# Patient Record
Sex: Male | Born: 1960 | Race: White | Hispanic: No | Smoking: Former smoker
Health system: Southern US, Community
[De-identification: ages and names within clinical notes are randomized; demographics above are authoritative.]

## PROBLEM LIST (undated history)

## (undated) DIAGNOSIS — G959 Disease of spinal cord, unspecified: Secondary | ICD-10-CM

## (undated) DIAGNOSIS — I10 Essential (primary) hypertension: Secondary | ICD-10-CM

## (undated) DIAGNOSIS — G20A1 Parkinson's disease without dyskinesia, without mention of fluctuations: Secondary | ICD-10-CM

## (undated) DIAGNOSIS — I639 Cerebral infarction, unspecified: Secondary | ICD-10-CM

## (undated) HISTORY — DX: Disease of spinal cord, unspecified: G95.9

## (undated) HISTORY — DX: Cerebral infarction, unspecified: I63.9

---

## 2020-03-13 ENCOUNTER — Other Ambulatory Visit: Payer: Self-pay

## 2020-03-13 ENCOUNTER — Ambulatory Visit: Payer: BLUE CROSS/BLUE SHIELD | Admitting: Family Medicine

## 2020-03-13 ENCOUNTER — Encounter: Payer: Self-pay | Admitting: Family Medicine

## 2020-03-13 VITALS — BP 112/80 | HR 85 | Temp 97.8°F | Ht 73.25 in | Wt 281.8 lb

## 2020-03-13 DIAGNOSIS — Z125 Encounter for screening for malignant neoplasm of prostate: Secondary | ICD-10-CM

## 2020-03-13 DIAGNOSIS — Z1211 Encounter for screening for malignant neoplasm of colon: Secondary | ICD-10-CM | POA: Diagnosis not present

## 2020-03-13 DIAGNOSIS — E669 Obesity, unspecified: Secondary | ICD-10-CM

## 2020-03-13 DIAGNOSIS — Z0000 Encounter for general adult medical examination without abnormal findings: Secondary | ICD-10-CM | POA: Diagnosis not present

## 2020-03-13 DIAGNOSIS — I10 Essential (primary) hypertension: Secondary | ICD-10-CM

## 2020-03-13 DIAGNOSIS — K219 Gastro-esophageal reflux disease without esophagitis: Secondary | ICD-10-CM

## 2020-03-13 NOTE — Progress Notes (Signed)
   Subjective:    Patient ID: John [name], male    DOB: 20-Jan-1961, 59 y.o.   MRN: [id number]  HPI He is here for complete examination.  He recently moved back to this area from [place].  He was a patient of mine before he moved to [place].  He does have a history of hypertension and presently is on Maxide.  He did have Covid in March of this year but states that other than fatigue he has had no troubles since then.  No chest pain, shortness of breath, fever, chills, nausea vomiting or diarrhea.  There is a family history of prostate cancer with his father dying at age 20 of this.  He does have reflux symptoms and presently taking Nexium for that.  He plans to start an exercise program of bicycling which he has done in the past but has stopped in the last several years.  He is not married and not dating anyone.  Family and social history as well as health maintenance and immunizations was reviewed.   Review of Systems  All other systems reviewed and are negative.      Objective:   Physical Exam  Alert and in no distress. Tympanic membranes and canals are normal. Pharyngeal area is normal. Neck is supple without adenopathy or thyromegaly. Cardiac exam shows a regular sinus rhythm without murmurs or gallops. Lungs are clear to auscultation. EKG was reviewed by me.  It was seen baseline interference on this as well as nonspecific ST-T changes.  Other parameters appeared normal.       Assessment & Plan:  Routine general medical examination at a health care facility - Plan: CBC with Differential/Platelet, Comprehensive metabolic panel, Hepatitis C antibody, EKG 12-Lead, Lipid panel  Essential hypertension - Plan: CBC with Differential/Platelet, Comprehensive metabolic panel, EKG 12-Lead  Screening for prostate cancer - Plan: PSA  Screening for colon cancer - Plan: Cologuard  Obesity (BMI 30.0-34.9) - Plan: CBC with Differential/Platelet, Comprehensive metabolic panel, Lipid  panel  Gastroesophageal reflux disease without esophagitis He is to return here in November and we will give him Shingrix as well as a Tdap.  Encouraged him to go ahead and start his exercise program but start low and go slow.  Discussed using proper protective equipment while riding his bike. Recommend he switch to Pepcid for control of his reflux symptoms and cut back to minimum effective dosing of that.  He was comfortable with that.

## 2020-03-13 NOTE — Patient Instructions (Signed)
Try Pepcid instead of the Nexium and see if he can get away with taking it every other day or even every third day

## 2020-03-14 LAB — COMPREHENSIVE METABOLIC PANEL
ALT: 10 IU/L (ref 0–44)
AST: 16 IU/L (ref 0–40)
Albumin/Globulin Ratio: 1.6 (ref 1.2–2.2)
Albumin: 4.2 g/dL (ref 3.8–4.9)
Alkaline Phosphatase: 89 IU/L (ref 48–121)
BUN/Creatinine Ratio: 20 (ref 9–20)
BUN: 21 mg/dL (ref 6–24)
Bilirubin Total: 0.4 mg/dL (ref 0.0–1.2)
CO2: 21 mmol/L (ref 20–29)
Calcium: 8.9 mg/dL (ref 8.7–10.2)
Chloride: 103 mmol/L (ref 96–106)
Creatinine, Ser: 1.06 mg/dL (ref 0.76–1.27)
GFR calc Af Amer: 88 mL/min/{1.73_m2} (ref >59)
GFR calc non Af Amer: 76 mL/min/{1.73_m2} (ref >59)
Globulin, Total: 2.7 g/dL (ref 1.5–4.5)
Glucose: 107 mg/dL — ABNORMAL HIGH (ref 65–99)
Potassium: 3.8 mmol/L (ref 3.5–5.2)
Sodium: 138 mmol/L (ref 134–144)
Total Protein: 6.9 g/dL (ref 6.0–8.5)

## 2020-03-14 LAB — CBC WITH DIFFERENTIAL/PLATELET
Basophils Absolute: 0.1 10*3/uL (ref 0.0–0.2)
Basos: 1 %
EOS (ABSOLUTE): 0.6 10*3/uL — ABNORMAL HIGH (ref 0.0–0.4)
Eos: 10 %
Hematocrit: 35.5 % — ABNORMAL LOW (ref 37.5–51.0)
Hemoglobin: 11.8 g/dL — ABNORMAL LOW (ref 13.0–17.7)
Immature Grans (Abs): 0 10*3/uL (ref 0.0–0.1)
Immature Granulocytes: 0 %
Lymphocytes Absolute: 1.6 10*3/uL (ref 0.7–3.1)
Lymphs: 28 %
MCH: 28.1 pg (ref 26.6–33.0)
MCHC: 33.2 g/dL (ref 31.5–35.7)
MCV: 85 fL (ref 79–97)
Monocytes Absolute: 0.6 10*3/uL (ref 0.1–0.9)
Monocytes: 10 %
Neutrophils Absolute: 3 10*3/uL (ref 1.4–7.0)
Neutrophils: 51 %
Platelets: 233 10*3/uL (ref 150–450)
RBC: 4.2 x10E6/uL (ref 4.14–5.80)
RDW: 13.5 % (ref 11.6–15.4)
WBC: 5.9 10*3/uL (ref 3.4–10.8)

## 2020-03-14 LAB — LIPID PANEL
Chol/HDL Ratio: 3.4 ratio (ref 0.0–5.0)
Cholesterol, Total: 171 mg/dL (ref 100–199)
HDL: 51 mg/dL (ref >39)
LDL Chol Calc (NIH): 90 mg/dL (ref 0–99)
Triglycerides: 178 mg/dL — ABNORMAL HIGH (ref 0–149)
VLDL Cholesterol Cal: 30 mg/dL (ref 5–40)

## 2020-03-14 LAB — PSA: Prostate Specific Ag, Serum: 0.7 ng/mL (ref 0.0–4.0)

## 2020-03-14 LAB — HEPATITIS C ANTIBODY: Hep C Virus Ab: <0.1 s/co ratio (ref 0.0–0.9)

## 2020-03-23 LAB — COLOGUARD: Cologuard: NEGATIVE

## 2020-03-27 LAB — COLOGUARD: COLOGUARD: NEGATIVE

## 2020-03-29 NOTE — Progress Notes (Signed)
Pt was advised of negative cologuard. KH 

## 2020-04-11 ENCOUNTER — Encounter: Payer: Self-pay | Admitting: Family Medicine

## 2020-07-31 ENCOUNTER — Other Ambulatory Visit: Payer: Self-pay

## 2020-07-31 ENCOUNTER — Other Ambulatory Visit (INDEPENDENT_AMBULATORY_CARE_PROVIDER_SITE_OTHER): Payer: BC Managed Care – PPO

## 2020-07-31 DIAGNOSIS — Z23 Encounter for immunization: Secondary | ICD-10-CM | POA: Diagnosis not present

## 2020-08-21 DIAGNOSIS — M545 Low back pain, unspecified: Secondary | ICD-10-CM | POA: Insufficient documentation

## 2020-10-09 ENCOUNTER — Telehealth: Payer: Self-pay | Admitting: Internal Medicine

## 2020-10-09 ENCOUNTER — Other Ambulatory Visit: Payer: Self-pay

## 2020-10-09 ENCOUNTER — Other Ambulatory Visit (INDEPENDENT_AMBULATORY_CARE_PROVIDER_SITE_OTHER): Payer: BC Managed Care – PPO

## 2020-10-09 DIAGNOSIS — Z23 Encounter for immunization: Secondary | ICD-10-CM | POA: Diagnosis not present

## 2020-10-09 MED ORDER — ESOMEPRAZOLE MAGNESIUM 20 MG PO CPDR: 20.0000 mg | DELAYED_RELEASE_CAPSULE | Freq: Every day | ORAL | 1 refills | Status: DC

## 2020-10-09 NOTE — Telephone Encounter (Signed)
Patient would like a prescription for nexium #90 to be sent in to [facility]. He states its cheaper to get an rx vs over the counter.

## 2020-10-13 ENCOUNTER — Telehealth: Payer: Self-pay

## 2020-10-13 NOTE — Telephone Encounter (Signed)
Pt was advise to call insurance company to find out what they will cover. KH

## 2021-02-26 ENCOUNTER — Other Ambulatory Visit: Payer: Self-pay

## 2021-02-26 ENCOUNTER — Ambulatory Visit: Payer: BC Managed Care – PPO | Admitting: Family Medicine

## 2021-02-26 ENCOUNTER — Encounter: Payer: Self-pay | Admitting: Family Medicine

## 2021-02-26 VITALS — BP 160/90 | HR 96 | Temp 98.3°F | Ht 74.0 in | Wt 315.8 lb

## 2021-02-26 DIAGNOSIS — M79672 Pain in left foot: Secondary | ICD-10-CM

## 2021-02-26 NOTE — Progress Notes (Signed)
   Subjective:    Patient ID: John [name], male    DOB: 1961/01/24, 60 y.o.   MRN: [id number]  HPI Noted some difficulty with left lateral foot pain that started on Friday.  No history of injury or overuse.  No other joint or foot discomfort.  He notes that especially when he walks barefoot that it causes trouble.   Review of Systems     Objective:   Physical Exam Exam of the left foot shows decreased dorsiflexion of the foot.  No tenderness over the lateral malleolus.  1 cm round saline mobile lesion is noted in the area of the fifth metatarsal.  Minimal tenderness to that area.  No tenderness over the phalanges.       Assessment & Plan:  Left foot pain - Plan: Ambulatory referral to Podiatry Makes me wonder whether this is a ganglion cyst.  Referral will be made.

## 2021-03-01 ENCOUNTER — Telehealth: Payer: Self-pay

## 2021-03-01 NOTE — Telephone Encounter (Signed)
Pt states he slipped last night going up the stairs and foot is now killing him, can hardly walk on it and would like something for pain called into [facility]

## 2021-03-01 NOTE — Telephone Encounter (Signed)
Pt informed, he will let us know tomorrow if not better will call back to get Xray

## 2021-03-05 ENCOUNTER — Ambulatory Visit: Payer: BC Managed Care – PPO | Admitting: Podiatry

## 2021-03-05 ENCOUNTER — Other Ambulatory Visit: Payer: Self-pay

## 2021-03-05 ENCOUNTER — Other Ambulatory Visit: Payer: Self-pay | Admitting: Podiatry

## 2021-03-05 ENCOUNTER — Ambulatory Visit (INDEPENDENT_AMBULATORY_CARE_PROVIDER_SITE_OTHER): Payer: BC Managed Care – PPO

## 2021-03-05 ENCOUNTER — Encounter: Payer: Self-pay | Admitting: Podiatry

## 2021-03-05 DIAGNOSIS — M722 Plantar fascial fibromatosis: Secondary | ICD-10-CM

## 2021-03-05 DIAGNOSIS — M79672 Pain in left foot: Secondary | ICD-10-CM | POA: Diagnosis not present

## 2021-03-05 DIAGNOSIS — M7672 Peroneal tendinitis, left leg: Secondary | ICD-10-CM

## 2021-03-05 MED ORDER — TRIAMCINOLONE ACETONIDE 10 MG/ML IJ SUSP
10.0000 mg | Freq: Once | INTRAMUSCULAR | Status: AC
  Administered 2021-03-05: 17:00:00 10 mg

## 2021-03-05 NOTE — Progress Notes (Signed)
Subjective:   Patient ID: John [name], male   DOB: 60 y.o.   MRN: [id number]   HPI Patient presents stating that he has a lot of pain on the outside of his left foot and its been this way recently and he is obese which is a complicating factor.  Does not remember specific injury but then did seem to traumatized his foot after the initial pain has started.  Patient does not smoke likes to be active   Review of Systems  All other systems reviewed and are negative.      Objective:  Physical Exam Vitals and nursing note reviewed.  Constitutional:      Appearance: He is well-developed.  Pulmonary:     Effort: Pulmonary effort is normal.  Musculoskeletal:        General: Normal range of motion.  Skin:    General: Skin is warm.  Neurological:     Mental Status: He is alert.    Neurovascular status intact muscle strength adequate range of motion adequate with patient being markedly obese which is complicating factor with exquisite discomfort in the lateral side of the left foot with inflammation fluid at the peroneal insertion base of fifth metatarsal.  Patient is found to have good digital perfusion well oriented x3      Assessment:  Acute peroneal tendinitis left with inflammation fluid     Plan:  H&P reviewed condition at this point I did sterile prep and injected the peroneal insertion 3 mg Kenalog 5 mg Xylocaine at insertion and I applied fascial brace to lift up the lateral side of the foot with instructions.  Reappoint 3 weeks or earlier if needed  X-rays were negative for signs of fracture or other bony pathology associated with the pain that he is experiencing

## 2021-03-09 ENCOUNTER — Telehealth: Payer: Self-pay | Admitting: *Deleted

## 2021-03-09 NOTE — Telephone Encounter (Signed)
Patient is calling for an appointment asap, was stepping out of front door today and heard his ankle pop.

## 2021-03-12 ENCOUNTER — Other Ambulatory Visit: Payer: Self-pay

## 2021-03-12 ENCOUNTER — Encounter: Payer: Self-pay | Admitting: Podiatry

## 2021-03-12 ENCOUNTER — Ambulatory Visit: Payer: BC Managed Care – PPO | Admitting: Podiatry

## 2021-03-12 DIAGNOSIS — M7672 Peroneal tendinitis, left leg: Secondary | ICD-10-CM

## 2021-03-12 DIAGNOSIS — R600 Localized edema: Secondary | ICD-10-CM | POA: Diagnosis not present

## 2021-03-12 NOTE — Progress Notes (Signed)
Subjective:   Patient ID: John [name], male   DOB: 60 y.o.   MRN: [id number]   HPI Patient states he has had a reduction of pain but he did land wrong on his foot and he has developed swelling in the foot and lower leg.  States that he has not had any shortness of breath or any other symptoms   ROS      Objective:  Physical Exam  Patient has developed quite a bit of pitting edema in the left midfoot into the ankle and left lower leg negative Denna Haggard' sign but concerned about the swelling mechanism he is developed     Assessment:  Cannot rule out that this may not be a blood clot versus trauma to his left lateral side     Plan:  H&P discussed and I did a plan for Unna boot Ace wrap to try to reduce some of the swelling but I am sending him for a stat Doppler and have asked and sent a request for this.  I gave strict instructions if he develops any furthering of symptoms any pain or any long symptoms he is to go straight to the emergency room.  Patient will be seen back in the next 2 weeks but earlier if any further pathology were to occur prior to that visit

## 2021-03-20 ENCOUNTER — Other Ambulatory Visit: Payer: Self-pay

## 2021-03-20 ENCOUNTER — Telehealth: Payer: Self-pay | Admitting: *Deleted

## 2021-03-20 ENCOUNTER — Ambulatory Visit (HOSPITAL_COMMUNITY)
Admission: RE | Admit: 2021-03-20 | Discharge: 2021-03-20 | Disposition: A | Discharge status: Home / Self Care | Payer: BC Managed Care – PPO | Source: Ambulatory Visit | Attending: Podiatry | Admitting: Podiatry

## 2021-03-20 DIAGNOSIS — R600 Localized edema: Secondary | ICD-10-CM | POA: Insufficient documentation

## 2021-03-20 NOTE — Telephone Encounter (Signed)
[facility] calling for results of the [place] study. The patient is negative for DVT of his left leg. Report will be faxed to office.

## 2021-03-26 ENCOUNTER — Ambulatory Visit: Payer: BC Managed Care – PPO | Admitting: Podiatry

## 2021-03-29 ENCOUNTER — Ambulatory Visit: Payer: BC Managed Care – PPO | Admitting: Podiatry

## 2021-05-19 ENCOUNTER — Encounter (HOSPITAL_BASED_OUTPATIENT_CLINIC_OR_DEPARTMENT_OTHER): Payer: Self-pay | Admitting: Emergency Medicine

## 2021-05-19 ENCOUNTER — Emergency Department (HOSPITAL_BASED_OUTPATIENT_CLINIC_OR_DEPARTMENT_OTHER): Payer: BC Managed Care – PPO

## 2021-05-19 ENCOUNTER — Emergency Department (HOSPITAL_BASED_OUTPATIENT_CLINIC_OR_DEPARTMENT_OTHER)
Admission: EM | Admit: 2021-05-19 | Discharge: 2021-05-19 | Disposition: A | Discharge status: Home / Self Care | Payer: BC Managed Care – PPO | Attending: Emergency Medicine | Admitting: Emergency Medicine

## 2021-05-19 ENCOUNTER — Other Ambulatory Visit: Payer: Self-pay

## 2021-05-19 DIAGNOSIS — S0992XA Unspecified injury of nose, initial encounter: Secondary | ICD-10-CM | POA: Diagnosis present

## 2021-05-19 DIAGNOSIS — F17290 Nicotine dependence, other tobacco product, uncomplicated: Secondary | ICD-10-CM | POA: Diagnosis not present

## 2021-05-19 DIAGNOSIS — I10 Essential (primary) hypertension: Secondary | ICD-10-CM | POA: Insufficient documentation

## 2021-05-19 DIAGNOSIS — W01198A Fall on same level from slipping, tripping and stumbling with subsequent striking against other object, initial encounter: Secondary | ICD-10-CM | POA: Insufficient documentation

## 2021-05-19 DIAGNOSIS — S0121XA Laceration without foreign body of nose, initial encounter: Secondary | ICD-10-CM

## 2021-05-19 DIAGNOSIS — S022XXB Fracture of nasal bones, initial encounter for open fracture: Secondary | ICD-10-CM | POA: Diagnosis not present

## 2021-05-19 HISTORY — DX: Essential (primary) hypertension: I10

## 2021-05-19 MED ORDER — AMOXICILLIN-POT CLAVULANATE 875-125 MG PO TABS: 1.0000 | ORAL_TABLET | Freq: Two times a day (BID) | ORAL | 0 refills | Status: DC

## 2021-05-19 MED ORDER — LIDOCAINE-EPINEPHRINE (PF) 2 %-1:200000 IJ SOLN
INTRAMUSCULAR | Status: AC
  Administered 2021-05-19: 10 mL via INTRADERMAL
  Filled 2021-05-19: qty 20

## 2021-05-19 MED ORDER — LIDOCAINE HCL URETHRAL/MUCOSAL 2 % EX GEL
1.0000 "application " | Freq: Once | CUTANEOUS | Status: DC
  Filled 2021-05-19: qty 11

## 2021-05-19 MED ORDER — LIDOCAINE HCL 2 % IJ SOLN: 10.0000 mL | Freq: Once | INTRAMUSCULAR | Status: DC

## 2021-05-19 MED ORDER — ACETAMINOPHEN 500 MG PO TABS
1000.0000 mg | ORAL_TABLET | Freq: Once | ORAL | Status: DC
  Filled 2021-05-19: qty 2

## 2021-05-19 MED ORDER — BACITRACIN ZINC 500 UNIT/GM EX OINT: 1.0000 "application " | TOPICAL_OINTMENT | Freq: Two times a day (BID) | CUTANEOUS | 0 refills | Status: DC

## 2021-05-19 MED ORDER — LIDOCAINE-EPINEPHRINE (PF) 2 %-1:200000 IJ SOLN: 10.0000 mL | Freq: Once | INTRAMUSCULAR | Status: AC

## 2021-05-19 MED ORDER — OXYCODONE-ACETAMINOPHEN 5-325 MG PO TABS
1.0000 | ORAL_TABLET | ORAL | Status: DC | PRN
  Administered 2021-05-19: 1 via ORAL
  Filled 2021-05-19: qty 1

## 2021-05-19 MED ORDER — AMOXICILLIN-POT CLAVULANATE 875-125 MG PO TABS
1.0000 | ORAL_TABLET | Freq: Once | ORAL | Status: AC
  Administered 2021-05-19: 1 via ORAL
  Filled 2021-05-19: qty 1

## 2021-05-19 MED ORDER — LIDOCAINE HCL (PF) 1 % IJ SOLN
5.0000 mL | Freq: Once | INTRAMUSCULAR | Status: DC
  Filled 2021-05-19: qty 5

## 2021-05-19 NOTE — ED Notes (Signed)
Pt verbalizes understanding of discharge instructions. Opportunity for questioning and answers were provided. Armand removed by staff, pt discharged from ED to home. Educated to pick up Rx. ABx ointment applied.

## 2021-05-19 NOTE — Discharge Instructions (Addendum)
1.  You have been given an antibiotic called Augmentin.  Take this twice a day as prescribed.  You may take extra strength Tylenol for pain every 6 hours.  Try to keep your head elevated about 30 degrees.  Sleep with some pillows behind your back or in a recliner for the next 2 to 4 days.  This will help with swelling and drainage.  Do not put anything in your nose.  If your nose is dripping or bleeding, dab gently. 2.  After 24 hours you may start bathing and allowing the wound to be cleaned with mild soap and water.  Rinse the wound well, dry well and apply your antibiotic ointment. 3.  Return to the emergency department if you have problems with worsening pain, signs of infection such as increasing redness or yellow drainage.  You should see your doctor for removal of stitches in approximately 7 days.

## 2021-05-19 NOTE — ED Provider Notes (Signed)
..  Laceration Repair  Date/Time: 05/19/2021 8:06 PM Performed by: Cristina Gong, PA-C Authorized by: Cristina Gong, PA-C   Consent:    Consent obtained:  Verbal   Consent given by:  Patient   Risks, benefits, and alternatives were discussed: yes     Risks discussed:  Infection, need for additional repair, poor cosmetic result, pain, retained foreign body, tendon damage, vascular damage, poor wound healing and nerve damage (Need for revision.  Adverse or allergic reaction to any of the medicines or products used)   Alternatives discussed:  No treatment and referral (Alternative wound closures) Universal protocol:    Procedure explained and questions answered to patient or proxy's satisfaction: yes     Immediately prior to procedure, a time out was called: yes     Patient identity confirmed:  Verbally with patient Anesthesia:    Anesthesia method:  Local infiltration   Local anesthetic:  Lidocaine 1% w/o epi (Insulin syringes were used to infiltrate lidocaine 1% without epi into the areas around the nose laceration, and into the mid to left sided upper lip.) Pre-procedure details:    Preparation:  Patient was prepped and draped in usual sterile fashion Exploration:    Hemostasis achieved with:  Epinephrine and direct pressure Treatment:    Area cleansed with:  Povidone-iodine   Irrigation solution:  Sterile saline Skin repair:    Repair method:  Sutures   Suture size:  6-0 and 5-0   Suture material:  Prolene   Suture technique:  Simple interrupted   Number of sutures:  9 (Six 6-0 Prolene sutures along the tip of the nose and to the exterior nasal septum, three 5-0 Prolene sutures on the upper lip/philtrum) Approximation:    Approximation:  Close Repair type:    Repair type:  Intermediate Post-procedure details:    Procedure completion:  Tolerated with difficulty (Difficulty due to chronic back pain, not due to the procedure itself other than positioning.) Comments:      Topical lidocaine with epinephrine was used soaked on a gauze pad to control bleeding with pressure as attempts at controlling bleeding to allow for safe suturing and inspection without epi containing product was unsuccessful.  The lidocaine and epi soaked gauze was used primarily on the lip/inferior border of the nose, not along the tip of the nose.       Cristina Gong, Cordelia Poche 05/19/21 2017    Arby Barrette, MD 05/20/21 [phone]

## 2021-05-19 NOTE — ED Triage Notes (Signed)
Pt fell face first onto concrete. Has laceration to nose and dried blood to nose.

## 2021-05-19 NOTE — ED Provider Notes (Signed)
[facility] EMERGENCY DEPT Provider Note   CSN: [id number] Arrival date & time: 05/19/21  1243     History Chief Complaint  Patient presents with   Marletta Lor    John [name] is a 60 y.o. male.  HPI Patient reports that he was stooping to pick something up off of the ground and lost his balance.  This caused him to fall forward and then hit his nose directly on the concrete step.  Patient denies he had any loss of consciousness.  He reports he has a lot of pain in his nose.  He reports it bled a lot.  He denies pain in his neck.  He denies injury to his arms or legs.  He reports other than the injury to his face, he does not have other pain.    Past Medical History:  Diagnosis Date   Hypertension     There are no problems to display for this patient.   History reviewed. No pertinent surgical history.     No family history on file.  Social History   Tobacco Use   Smoking status: Light Smoker    Types: Cigars   Smokeless tobacco: Never  Vaping Use   Vaping Use: Never used  Substance Use Topics   Alcohol use: Yes    Alcohol/week: 1.0 - 2.0 standard drink    Types: 1 - 2 Shots of liquor per week   Drug use: Never    Home Medications Prior to Admission medications   Medication Sig Start Date End Date Taking? Authorizing Provider  amoxicillin-clavulanate (AUGMENTIN) 875-125 MG tablet Take 1 tablet by mouth 2 (two) times daily. One po bid x 7 days 05/19/21  Yes Adon Gehlhausen, Lebron Conners, MD  bacitracin ointment Apply 1 application topically 2 (two) times daily. 05/19/21  Yes Arby Barrette, MD  acetaminophen (TYLENOL) 500 MG tablet Take 500 mg by mouth every 6 (six) hours as needed.    [provider]  cyclobenzaprine (FLEXERIL) 10 MG tablet cyclobenzaprine 10 mg tablet  Take 1 tablet twice a day by oral route for 30 days.    [provider]  esomeprazole (NEXIUM) 20 MG capsule Take 1 capsule (20 mg total) by mouth daily at 12 noon. 10/09/20   Ronnald Nian, MD  triamterene-hydrochlorothiazide (MAXZIDE-25) 37.5-25 MG tablet Take 1 tablet by mouth daily.    [provider]    Allergies    Patient has no allergy information on record.  Review of Systems   Review of Systems 10 systems reviewed negative except for HPI Physical Exam Updated Vital Signs BP (!) 155/86 (BP Location: Right Arm)   Pulse 95   Temp 98 F (36.7 C) (Oral)   Resp 18   SpO2 99%   Physical Exam Constitutional:      Comments: Patient is alert.  No distress.  Mental status clear.  Obvious nasal laceration.  HENT:     Nose:     Comments: Patient superficial abrasion to the bridge of the nose.  Moderate general swelling of the nose.  A linear laceration on the tip of the nose straight down the middle of the septum to the upper lip.  Mild gaping.  Depth of several millimeters.  No active bleeding.  Patient does have a large clot just proximally in the left nare.  Behind his clot however passages clear.  Right nare clear.    Mouth/Throat:     Pharynx: Oropharynx is clear.  Eyes:     Extraocular Movements:  Extraocular movements intact.     Pupils: Pupils are equal, round, and reactive to light.  Neck:     Comments: C-spine nontender. Cardiovascular:     Rate and Rhythm: Normal rate and regular rhythm.  Pulmonary:     Effort: Pulmonary effort is normal.     Breath sounds: Normal breath sounds.  Chest:     Chest wall: No tenderness.  Abdominal:     General: There is no distension.     Palpations: Abdomen is soft.     Tenderness: There is no abdominal tenderness.  Musculoskeletal:        General: Normal range of motion.  Skin:    General: Skin is warm and dry.  Neurological:     General: No focal deficit present.     Mental Status: He is oriented to person, place, and time.     Coordination: Coordination normal.  Psychiatric:        Mood and Affect: Mood normal.    ED Results / Procedures / Treatments   Labs (all labs ordered are listed,  but only abnormal results are displayed) Labs Reviewed - No data to display  EKG None  Radiology CT HEAD WO CONTRAST ( )  Result Date: 05/19/2021 CLINICAL DATA:  Facial trauma.  Fall face first onto concrete. EXAM: CT HEAD WITHOUT CONTRAST TECHNIQUE: Contiguous axial images were obtained from the base of the skull through the vertex without intravenous contrast. COMPARISON:  None. FINDINGS: Brain: Generalized cerebral atrophy. No intracranial hemorrhage, mass effect, or midline shift. No hydrocephalus. The basilar cisterns are patent. Minimal periventricular chronic small vessel ischemia. 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 No evidence of territorial infarct or acute ischemia. No extra-axial or intracranial fluid collection. Vascular: No hyperdense vessel. Skull: No fracture or focal lesion. Sinuses/Orbits: Assessed on concurrent face CT, reported separately. Other: None. IMPRESSION: 1. No acute intracranial abnormality. No skull fracture. 2. Generalized cerebral atrophy. 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 Electronically Signed   By: Narda Rutherford M.D.   On: 05/19/2021 17:02   CT Maxillofacial Wo Contrast  Result Date: 05/19/2021 CLINICAL DATA:  Fall face first onto concrete.  Laceration to nose. EXAM: CT MAXILLOFACIAL WITHOUT CONTRAST TECHNIQUE: Multidetector CT imaging of the maxillofacial structures was performed. Multiplanar CT image reconstructions were also generated. COMPARISON:  None. FINDINGS: Osseous: Minimal buckling of the anterior nasal septum of unknown acuity. There is slight leftward nasal septal deviation. Cortical irregularity of the anterior nasal spine. Intact mandibles and zygomatic arches. There scattered dental caries. The temporomandibular joints are congruent. Orbits: No acute orbital fracture.  There is no globe injury. Sinuses: No sinus fracture or fluid level. Soft tissues: Inferior nasal soft  tissue edema with small foci of gas. Limited intracranial: Assessed on concurrent head CT, reported separately. IMPRESSION: 1. Cortical irregularity of the anterior nasal spine. Minimal buckling of the anterior nasal septum. Given overlying soft tissue thickening, favored to be acute nondisplaced fractures. 2. No additional acute facial bone fracture. Electronically Signed   By: Narda Rutherford M.D.   On: 05/19/2021 17:00    Procedures Procedures   Medications Ordered in ED Medications  oxyCODONE-acetaminophen (PERCOCET/ROXICET) 5-325 MG per tablet 1 tablet (1 tablet Oral Given 05/19/21 1258)  lidocaine (XYLOCAINE) 2 % jelly 1 application (has no administration in time range)  lidocaine (PF) (XYLOCAINE) 1 % injection 5 mL (has no administration in time range)  lidocaine-EPINEPHrine (XYLOCAINE W/EPI) 2 %-1:200000 (PF) injection (  has no administration in time range)  lidocaine-EPINEPHrine (XYLOCAINE W/EPI) 2 %-1:200000 (PF) injection 10 mL (has no administration in time range)  amoxicillin-clavulanate (AUGMENTIN) 875-125 MG per tablet 1 tablet (has no administration in time range)  acetaminophen (TYLENOL) tablet 1,000 mg (has no administration in time range)    ED Course  I have reviewed the triage vital signs and the nursing notes.  Pertinent labs & imaging results that were available during my care of the patient were reviewed by me and considered in my medical decision making (see chart for details).    MDM Rules/Calculators/A&P                           Patient presents with fall.  CT shows possible nasal fracture.  Patient has complex nasal laceration. No other apparent injury.  Laceration repaired by [place].Due to nature of the wound and underlying fracture, patient will be placed on Augmentin.  Patient is advised for maintaining semirecumbent position and application of bacitracin ointment.  Follow-up plan with PCP reviewed.  Patient is advised he may need referral to ENT but can  first follow-up with PCP for suture removal. Final Clinical Impression(s) / ED Diagnoses Final diagnoses:  Complex laceration of nose, initial encounter  Open fracture of nasal bone, initial encounter    Rx / DC Orders ED Discharge Orders          Ordered    amoxicillin-clavulanate (AUGMENTIN) 875-125 MG tablet  2 times daily        05/19/21 2011    bacitracin ointment  2 times daily        05/19/21 2011             Arby Barrette, MD 05/19/21 2018

## 2021-05-19 NOTE — ED Notes (Signed)
As I write this, he is in CT. 

## 2021-05-21 ENCOUNTER — Telehealth: Payer: Self-pay | Admitting: Family Medicine

## 2021-05-21 NOTE — Telephone Encounter (Signed)
Patient called on call provider and states he feel down on his back porch and stated he was bleeding and needed stitches. Advised patient to go to an urgent care or the emergency department for evaluation and treatment. He agreed and stated he was able to drive or get someone to drive him if not.

## 2021-05-31 ENCOUNTER — Other Ambulatory Visit: Payer: Self-pay

## 2021-05-31 ENCOUNTER — Ambulatory Visit: Payer: BC Managed Care – PPO | Admitting: Family Medicine

## 2021-05-31 VITALS — BP 118/76 | HR 89 | Temp 96.3°F | Wt 290.4 lb

## 2021-05-31 DIAGNOSIS — R296 Repeated falls: Secondary | ICD-10-CM

## 2021-05-31 DIAGNOSIS — R4781 Slurred speech: Secondary | ICD-10-CM

## 2021-05-31 DIAGNOSIS — S0121XA Laceration without foreign body of nose, initial encounter: Secondary | ICD-10-CM

## 2021-05-31 NOTE — Progress Notes (Signed)
   Subjective:    Patient ID: John [name], male    DOB: 03/18/1961, 60 y.o.   MRN: [id number]  HPI He fell on September 3 sustaining a laceration to the nose.  He was cared for in the emergency room.  Sutures were applied to the nose and upper lip.  CT scans were done.  It did show possible nasal soft tissue damage.  The CT scan also showed cerebral atrophy and an old lacunar infarct.  He also states that he has been falling a lot more recently than in the past.   Review of Systems     Objective:   Physical Exam Alert and in no distress.  Nasal laceration is noted to the mid nose and mid upper lip.  His speech is slurred.  He was observed walking with a normal gait.       Assessment & Plan:  Laceration of nose, initial encounter  Slurred speech  Multiple falls The sutures were all removed without difficulty.  He does show residual scarring to the bridge of the nose.  He is not interested in any further revision of that. Then discussed the slurred speech and the multiple falls.  I will have him return here for complete examination and then referral to neurology for their input into his cerebral atrophy and lacunar infarct.

## 2021-07-16 ENCOUNTER — Telehealth: Payer: Self-pay | Admitting: Family Medicine

## 2021-07-16 ENCOUNTER — Other Ambulatory Visit: Payer: Self-pay

## 2021-07-16 MED ORDER — OMEPRAZOLE MAGNESIUM 20 MG PO TBEC: 20.0000 mg | DELAYED_RELEASE_TABLET | Freq: Every day | ORAL | 1 refills | Status: DC

## 2021-07-16 NOTE — Telephone Encounter (Signed)
Pt called and wants referral to [facility] for stroke symptoms and memory issues. He states he has seen you for this previously.  He is also requesting rx for prilosec  ( otc) sent to [facility]

## 2021-07-24 ENCOUNTER — Telehealth: Payer: Self-pay

## 2021-07-24 NOTE — Telephone Encounter (Signed)
Appt made. KH 

## 2021-07-24 NOTE — Telephone Encounter (Signed)
Pt wants to know about [facility] for stroke symptoms and memory concerns. Please advise if this can be put in or should pt be seen first. [facility]

## 2021-07-27 ENCOUNTER — Other Ambulatory Visit: Payer: Self-pay

## 2021-07-27 ENCOUNTER — Ambulatory Visit: Payer: BC Managed Care – PPO | Admitting: Family Medicine

## 2021-07-27 ENCOUNTER — Encounter: Payer: Self-pay | Admitting: Family Medicine

## 2021-07-27 VITALS — BP 144/96 | HR 84 | Ht 74.0 in | Wt 272.8 lb

## 2021-07-27 DIAGNOSIS — R4781 Slurred speech: Secondary | ICD-10-CM

## 2021-07-27 DIAGNOSIS — Z23 Encounter for immunization: Secondary | ICD-10-CM | POA: Diagnosis not present

## 2021-07-27 DIAGNOSIS — I10 Essential (primary) hypertension: Secondary | ICD-10-CM

## 2021-07-27 DIAGNOSIS — R296 Repeated falls: Secondary | ICD-10-CM | POA: Diagnosis not present

## 2021-07-27 MED ORDER — LOSARTAN POTASSIUM-HCTZ 50-12.5 MG PO TABS
1.0000 | ORAL_TABLET | Freq: Every day | ORAL | 3 refills | Status: DC
Start: 2021-07-27 — End: 2021-10-29

## 2021-07-27 NOTE — Progress Notes (Signed)
   Subjective:    Patient ID: John [name], male    DOB: March 11, 1961, 60 y.o.   MRN: [id number]  HPI He is here for follow-up visit.  On his last visit he was recommended to come back for further evaluation which is why he is here.  He has a history of multiple falls over the last several months.  He is also noted speech difficulty with slight slurring of his words as well as feeling as if his muscles are not working like they should.  He is also walking at a slower pace.  He has had no skin or hair changes, hot or cold intolerance.  Does not smoke or do street drugs He also states that he would like his blood pressure medicine changed as he does have nocturia related to this.   Review of Systems     Objective:   Physical Exam Alert and in no distress.  MMSE is 29.  EOMI.  Other cranial nerves intact.  He does have somewhat of a masked facies look.  Cerebellar testing negative.  Negative pronator drift.  No clonus.  Reflexes difficult to get in the upper extremities but 2-3+ in the lower extremities.  No clonus.  He was observed walking and does have a questionable parkinsonian gait.       Assessment & Plan:  Slurred speech - Plan: CBC with Differential/Platelet, Comprehensive metabolic panel, Lipid panel, TSH, Vitamin B12, RPR  Multiple falls - Plan: CBC with Differential/Platelet, Comprehensive metabolic panel, Lipid panel, TSH, Vitamin B12, RPR  Immunization, viral disease - Plan: [employer], [employer]  Essential hypertension - Plan: losartan-hydrochlorothiazide (HYZAAR) 50-12.5 MG tablet I explained that his symptoms are suggestive of Parkinson's disease and I will do routine blood screening to evaluate other possible treatable causes.  If negative, I will refer to neurology for further evaluation and treatment.  Follow-up on blood pressure in several months.

## 2021-07-28 LAB — LIPID PANEL
Chol/HDL Ratio: 3.9 ratio (ref 0.0–5.0)
Cholesterol, Total: 178 mg/dL (ref 100–199)
HDL: 46 mg/dL (ref >39)
LDL Chol Calc (NIH): 108 mg/dL — ABNORMAL HIGH (ref 0–99)
Triglycerides: 137 mg/dL (ref 0–149)
VLDL Cholesterol Cal: 24 mg/dL (ref 5–40)

## 2021-07-28 LAB — COMPREHENSIVE METABOLIC PANEL
ALT: 15 IU/L (ref 0–44)
AST: 20 IU/L (ref 0–40)
Albumin/Globulin Ratio: 2 (ref 1.2–2.2)
Albumin: 4.3 g/dL (ref 3.8–4.9)
Alkaline Phosphatase: 94 IU/L (ref 44–121)
BUN/Creatinine Ratio: 17 (ref 10–24)
BUN: 23 mg/dL (ref 8–27)
Bilirubin Total: 0.3 mg/dL (ref 0.0–1.2)
CO2: 24 mmol/L (ref 20–29)
Calcium: 9.4 mg/dL (ref 8.6–10.2)
Chloride: 103 mmol/L (ref 96–106)
Creatinine, Ser: 1.38 mg/dL — ABNORMAL HIGH (ref 0.76–1.27)
Globulin, Total: 2.1 g/dL (ref 1.5–4.5)
Glucose: 102 mg/dL — ABNORMAL HIGH (ref 70–99)
Potassium: 3.2 mmol/L — ABNORMAL LOW (ref 3.5–5.2)
Sodium: 143 mmol/L (ref 134–144)
Total Protein: 6.4 g/dL (ref 6.0–8.5)
eGFR: 59 mL/min/{1.73_m2} — ABNORMAL LOW (ref >59)

## 2021-07-28 LAB — CBC WITH DIFFERENTIAL/PLATELET
Basophils Absolute: 0.1 10*3/uL (ref 0.0–0.2)
Basos: 1 %
EOS (ABSOLUTE): 0.4 10*3/uL (ref 0.0–0.4)
Eos: 6 %
Hematocrit: 32.5 % — ABNORMAL LOW (ref 37.5–51.0)
Hemoglobin: 10.5 g/dL — ABNORMAL LOW (ref 13.0–17.7)
Immature Grans (Abs): 0 10*3/uL (ref 0.0–0.1)
Immature Granulocytes: 0 %
Lymphocytes Absolute: 1.7 10*3/uL (ref 0.7–3.1)
Lymphs: 28 %
MCH: 25.9 pg — ABNORMAL LOW (ref 26.6–33.0)
MCHC: 32.3 g/dL (ref 31.5–35.7)
MCV: 80 fL (ref 79–97)
Monocytes Absolute: 0.4 10*3/uL (ref 0.1–0.9)
Monocytes: 7 %
Neutrophils Absolute: 3.5 10*3/uL (ref 1.4–7.0)
Neutrophils: 58 %
Platelets: 306 10*3/uL (ref 150–450)
RBC: 4.06 x10E6/uL — ABNORMAL LOW (ref 4.14–5.80)
RDW: 14.9 % (ref 11.6–15.4)
WBC: 6.1 10*3/uL (ref 3.4–10.8)

## 2021-07-28 LAB — VITAMIN B12: Vitamin B-12: 485 pg/mL (ref 232–1245)

## 2021-07-28 LAB — TSH: TSH: 3.38 u[IU]/mL (ref 0.450–4.500)

## 2021-07-28 LAB — RPR: RPR Ser Ql: NONREACTIVE

## 2021-07-29 NOTE — Addendum Note (Signed)
Addended by: Ronnald Nian on: 07/29/2021 09:10 PM   Modules accepted: Orders

## 2021-08-03 NOTE — Progress Notes (Signed)
Mailed letter due to vm not being set up. KH

## 2021-08-08 ENCOUNTER — Telehealth: Payer: Self-pay

## 2021-08-08 NOTE — Telephone Encounter (Signed)
Patient left VM about lab results and neuro referral.  Called back and notified his voicemail is not working he states he just got a new phone. Notified he is ready to schedule with Dr. Everlena Cooper neurologist, he is going to call their office and schedule this

## 2021-08-13 ENCOUNTER — Encounter: Payer: Self-pay | Admitting: Neurology

## 2021-09-27 ENCOUNTER — Telehealth: Payer: Self-pay | Admitting: Family Medicine

## 2021-09-27 NOTE — Telephone Encounter (Signed)
Pt called and is wanting to know what he can take OTC states he has a runny nose that has green mucous he wants to know what he can take to dry it up, I offered him a appt but he did not want one,  pt can be reached at [phone]

## 2021-10-02 ENCOUNTER — Other Ambulatory Visit: Payer: Self-pay | Admitting: Physician Assistant

## 2021-10-02 ENCOUNTER — Other Ambulatory Visit: Payer: Self-pay

## 2021-10-02 ENCOUNTER — Ambulatory Visit
Admission: RE | Admit: 2021-10-02 | Discharge: 2021-10-02 | Disposition: A | Discharge status: Home / Self Care | Payer: BC Managed Care – PPO | Source: Ambulatory Visit | Attending: Physician Assistant | Admitting: Physician Assistant

## 2021-10-02 DIAGNOSIS — R079 Chest pain, unspecified: Secondary | ICD-10-CM

## 2021-10-11 DIAGNOSIS — R0789 Other chest pain: Secondary | ICD-10-CM | POA: Insufficient documentation

## 2021-10-29 ENCOUNTER — Ambulatory Visit (INDEPENDENT_AMBULATORY_CARE_PROVIDER_SITE_OTHER): Payer: BC Managed Care – PPO | Admitting: Family Medicine

## 2021-10-29 ENCOUNTER — Other Ambulatory Visit: Payer: Self-pay

## 2021-10-29 ENCOUNTER — Encounter: Payer: Self-pay | Admitting: Family Medicine

## 2021-10-29 VITALS — BP 126/80 | HR 75 | Temp 97.5°F | Ht 72.5 in | Wt 270.8 lb

## 2021-10-29 DIAGNOSIS — D649 Anemia, unspecified: Secondary | ICD-10-CM | POA: Diagnosis not present

## 2021-10-29 DIAGNOSIS — R0781 Pleurodynia: Secondary | ICD-10-CM

## 2021-10-29 DIAGNOSIS — Z8042 Family history of malignant neoplasm of prostate: Secondary | ICD-10-CM

## 2021-10-29 DIAGNOSIS — K219 Gastro-esophageal reflux disease without esophagitis: Secondary | ICD-10-CM | POA: Insufficient documentation

## 2021-10-29 DIAGNOSIS — I10 Essential (primary) hypertension: Secondary | ICD-10-CM

## 2021-10-29 DIAGNOSIS — R4781 Slurred speech: Secondary | ICD-10-CM

## 2021-10-29 DIAGNOSIS — Z0000 Encounter for general adult medical examination without abnormal findings: Secondary | ICD-10-CM | POA: Insufficient documentation

## 2021-10-29 DIAGNOSIS — E669 Obesity, unspecified: Secondary | ICD-10-CM

## 2021-10-29 DIAGNOSIS — Z125 Encounter for screening for malignant neoplasm of prostate: Secondary | ICD-10-CM

## 2021-10-29 HISTORY — DX: Essential (primary) hypertension: I10

## 2021-10-29 HISTORY — DX: Gastro-esophageal reflux disease without esophagitis: K21.9

## 2021-10-29 HISTORY — DX: Family history of malignant neoplasm of prostate: Z80.42

## 2021-10-29 MED ORDER — LOSARTAN POTASSIUM-HCTZ 50-12.5 MG PO TABS: 1.0000 | ORAL_TABLET | Freq: Every day | ORAL | 3 refills | Status: DC

## 2021-10-29 NOTE — Patient Instructions (Addendum)
you can take 2 extra strength Tylenol 3 times per day and 4 ibuprofen 3 times per day Take the Prilosec every other day to see if that will help with your symptoms.

## 2021-10-29 NOTE — Progress Notes (Signed)
° °  Subjective:    Patient ID: John [name], male    DOB: 1960/12/10, 61 y.o.   MRN: [id number]  HPI He is here for complete examination.  He did fall several weeks ago and is still having some left rib pain.  He did have x-rays which were negative.  He has been taking Tylenol and ibuprofen but still having trouble.  He is scheduled to see neurology for follow-up on his slurred speech.  He recently had an episode of COVID but really did not have much in the way of symptoms.  Continues on his blood pressure medication without difficulty.  He also has reflux disease and is using Prilosec on a regular basis. His father apparently did die from prostate cancer.  Otherwise family and social history as well as health maintenance and immunizations was reviewed.  Review of Systems  All other systems reviewed and are negative.     Objective:   Physical Exam Alert and in no distress.  EOMI.  Other cranial nerves grossly intact.  Slightly flat affect is noted.  He does seem to be walking with a normal gait.  Tympanic membranes and canals are normal. Pharyngeal area is normal. Neck is supple without adenopathy or thyromegaly. Cardiac exam shows a regular sinus rhythm without murmurs or gallops. Lungs are clear to auscultation.  Some left-sided lower left rib pain is noted.  Abdominal exam shows no masses or tenderness.       Assessment & Plan:  Routine general medical examination at a health care facility  Essential hypertension  Slurred speech  Rib pain on left side  Anemia, unspecified type  Screening for prostate cancer  Obesity (BMI 30.0-34.9)  Gastroesophageal reflux disease without esophagitis you can take 2 extra strength Tylenol 3 times per day and 4 ibuprofen 3 times per day

## 2021-10-30 LAB — CBC WITH DIFFERENTIAL/PLATELET
Basophils Absolute: 0.1 10*3/uL (ref 0.0–0.2)
Basos: 1 %
EOS (ABSOLUTE): 0.3 10*3/uL (ref 0.0–0.4)
Eos: 5 %
Hematocrit: 32.7 % — ABNORMAL LOW (ref 37.5–51.0)
Hemoglobin: 10.5 g/dL — ABNORMAL LOW (ref 13.0–17.7)
Immature Grans (Abs): 0 10*3/uL (ref 0.0–0.1)
Immature Granulocytes: 0 %
Lymphocytes Absolute: 1.7 10*3/uL (ref 0.7–3.1)
Lymphs: 30 %
MCH: 26.4 pg — ABNORMAL LOW (ref 26.6–33.0)
MCHC: 32.1 g/dL (ref 31.5–35.7)
MCV: 82 fL (ref 79–97)
Monocytes Absolute: 0.4 10*3/uL (ref 0.1–0.9)
Monocytes: 8 %
Neutrophils Absolute: 3.3 10*3/uL (ref 1.4–7.0)
Neutrophils: 56 %
Platelets: 261 10*3/uL (ref 150–450)
RBC: 3.98 x10E6/uL — ABNORMAL LOW (ref 4.14–5.80)
RDW: 13.3 % (ref 11.6–15.4)
WBC: 5.7 10*3/uL (ref 3.4–10.8)

## 2021-10-30 LAB — PSA: Prostate Specific Ag, Serum: 1 ng/mL (ref 0.0–4.0)

## 2021-11-06 NOTE — Progress Notes (Signed)
He h  NEUROLOGY CONSULTATION NOTE  John [name] MRN: [id number] DOB: 04/10/1961  Referring provider: Sharlot Gowda, MD Primary care provider: Sharlot Gowda, MD  Reason for consult:  slurred speech, falls  Assessment/Plan:   Parkinson's disease  Start carbidopa-levodopa 25/100mg  titrating to 1 tablet three times daily Follow up 3 months.   Subjective:  John [name] is a 61 year old right-handed male with HTN who presents for slurred speech and multiple falls.  History supplemented by PCP's notes.  Since last year, he started having balance problems with increased falls.  He was falling on a weekly basis.  Sometimes he loses his balance when his feet start moving quickly and the rest of his body cannot catch up.  He had a fall in September in which he bent over to pick something off of the ground and lost balance striking his face on the concrete step and sustaining a laceration to the nose.  He was seen in the ED where CT head and face personally reviewed revealed generalized cerebral atrophy and remote lacunar infarct in the anterior limb of the right internal capsule but no fractures or acute intracranial abnormality.  He feels like he is moving slowly.  He has more difficulty writing.  His handwriting is smaller.  He denies difficulty using utensils or chewing and swallowing.  He feels that he cannot speak loudly.  Denies tremor.  Denies REM sleep behavior disorder.  Sense of smell is good.  Has some chronic arthritic low back pain but no radicular pain or numbness in the legs.  Labs in November revealed B12 485, TSH 3.380 and non-reactive RPR.  No family history of Parkinson's disease or other neurologic disorder.  PAST MEDICAL HISTORY: Past Medical History:  Diagnosis Date   Hypertension     PAST SURGICAL HISTORY: No past surgical history on file.  MEDICATIONS: Current Outpatient Medications on File Prior to Visit  Medication Sig Dispense Refill   acetaminophen  (TYLENOL) 500 MG tablet Take 500 mg by mouth every 6 (six) hours as needed. (Patient not taking: Reported on 05/31/2021)     cyclobenzaprine (FLEXERIL) 10 MG tablet cyclobenzaprine 10 mg tablet  Take 1 tablet twice a day by oral route for 30 days. (Patient not taking: Reported on 07/27/2021)     ibuprofen (ADVIL) 200 MG tablet Take 200 mg by mouth every 6 (six) hours as needed. (Patient not taking: Reported on 07/27/2021)     losartan-hydrochlorothiazide (HYZAAR) 50-12.5 MG tablet Take 1 tablet by mouth daily. 90 tablet 3   Multiple Vitamin (MULTIVITAMIN) tablet Take 1 tablet by mouth daily.     omeprazole (PRILOSEC OTC) 20 MG tablet Take 1 tablet (20 mg total) by mouth daily. 90 tablet 1   No current facility-administered medications on file prior to visit.    ALLERGIES: Not on File  FAMILY HISTORY: Family History  Problem Relation Age of Onset   Cancer Father     Objective:  Blood pressure (!) 142/87, pulse 79, height 6\' 2"  (1.88 m), weight 271 lb 6.4 oz (123.1 kg), SpO2 97 %. General: No acute distress.  Patient appears well-groomed.   Head:  Normocephalic/atraumatic Eyes:  fundi examined but not visualized Neck: supple, no paraspinal tenderness, full range of motion Back: No paraspinal tenderness Heart: regular rate and rhythm Lungs: Clear to auscultation bilaterally. Vascular: No carotid bruits. Neurological Exam: Mental status: alert and oriented to person, place, and time, speech fluent and not dysarthric, language intact.  Bradyphrenic Cranial nerves: CN I:  not tested CN II: pupils equal, round and reactive to light, visual fields intact CN III, IV, VI:  full range of motion, no nystagmus, no ptosis CN V: facial sensation intact. CN VII: upper and lower face symmetric CN VIII: hearing intact CN IX, X: gag intact, uvula midline CN XI: sternocleidomastoid and trapezius muscles intact CN XII: tongue midline Bulk & Tone: increased tone in elbows and wrists, no  fasciculations. Motor:  muscle strength 5/5 throughout.  No tremor. Sensation:  Pinprick, temperature and vibratory sensation intact. Deep Tendon Reflexes:  2+ throughout,  toes downgoing.   Finger to nose testing:  Without dysmetria.   Heel to shin:  Without dysmetria.   Gait:  upright posture with reduced bilateral arm swing.  Slight reduced stride.  Romberg with sway. Retropulsion test negative.    Thank you for allowing me to take part in the care of this patient.  Shon Millet, DO  CC: Sharlot Gowda, MD

## 2021-11-07 ENCOUNTER — Ambulatory Visit: Payer: BC Managed Care – PPO | Admitting: Neurology

## 2021-11-07 ENCOUNTER — Encounter: Payer: Self-pay | Admitting: Neurology

## 2021-11-07 ENCOUNTER — Telehealth: Payer: Self-pay | Admitting: *Deleted

## 2021-11-07 ENCOUNTER — Other Ambulatory Visit: Payer: Self-pay

## 2021-11-07 VITALS — BP 142/87 | HR 79 | Ht 74.0 in | Wt 271.4 lb

## 2021-11-07 DIAGNOSIS — G20 Parkinson's disease: Secondary | ICD-10-CM

## 2021-11-07 MED ORDER — CARBIDOPA-LEVODOPA 25-100 MG PO TABS: ORAL_TABLET | ORAL | 5 refills | Status: DC

## 2021-11-07 NOTE — Telephone Encounter (Signed)
John [name] Disability form came over from his employer. Dr. Redmond School did not do a full exam to assess this issue as he was here for CPE and only briefly mentioned that he had inured his rib some time ago. He was seen by Dr. Gladstone Lighter on 10/11/21 for this issue (ortho) he can go back to them and have them fill out this paperwork as they have more information or he can schedule another appointment here-message left.

## 2021-11-07 NOTE — Patient Instructions (Addendum)
1. Start carbidopa (Sinemet) 25/100mg  tablets.  Week 1: Take 0.5 tablet in morning and 0.5 tablet in evening/bedtime.  Week 2: Take 0.5 tablet in morning, 0.5 tablet in afternoon, and 0.5 tablet in evening/bedtime.  Week 3 & thereafter: Take 1 tablet three times daily.   Take the medication at the same time everyday. The medication does not get absorbed into your body as well, if you take it with protein-containing foods (meat, dairy, beans). Try taking the medication about one hour before meals. If you experience nausea by taking it on an empty stomach, you may take it with carbohydrate-containing food,such as bread or crackers. Side effects to look out for, include dizziness, nausea, vivid dreams and hallucinations. If you experience any of these symptoms, please call us.  2.  Follow up 3 months.   Parkinson's Disease Parkinson's disease is a movement disorder. It is a long-term condition that gets worse over time. Each person with Parkinson's disease is affected differently. This condition limits a person's ability to control movements and move the body normally. The condition can range from mild to severe. Parkinson's disease tends to get worse slowly over several years. What are the causes? Parkinson's disease is caused by a loss of brain cells (neurons) that make a brain chemical called dopamine. Dopamine is needed to control movement. As the condition gets worse, more neurons that make dopamine die. This makes it hard to move or control your movements. The exact cause of the loss of neurons is not known. Genes and the environment may contribute to the cause of Parkinson's disease. What increases the risk? The following factors may make you more likely to develop this condition: Being male. Being age 20 or older. Having a family history of Parkinson's disease. Having had a traumatic brain injury. Having been exposed to toxins, such as pesticides. Having depression. What are the signs  or symptoms? Symptoms of this condition can vary. The main symptoms are related to movement. These include: A tremor or shaking while you are resting. You cannot control the shaking. Stiffness in your arms and legs (rigidity). Slowing of movement. You may lose facial expressions and have trouble making small movements that are needed to button clothing or brush your teeth. An abnormal walk. You may walk with short, shuffling steps. Loss of balance and stability when standing. You may sway, fall backward, and have trouble making turns. Other symptoms include: Mental or cognitive changes, including: Depression or anxiety. Having false beliefs (delusions). Seeing, hearing, or feeling things that do not exist (hallucinations). Trouble speaking or swallowing. Changes in bowel or bladder functions, including constipation, having to go urgently or frequently, or not being able to control your bowel or bladder. Changes in sleep habits, acting out dreams, or trouble sleeping. Depending on the severity of the symptoms, Parkinson's disease may be mild, moderate, or advanced. Parkinson's disease progression is different for everyone. Some people may not progress to the advanced stage. Mild Parkinson's disease involves: Movement problems that do not affect daily activities. Movement problems on one side of the body. Moderate Parkinson's disease involves: Movement problems on both sides of the body. Slowing of movement. Coordination and balance problems. Advanced Parkinson's disease involves: Extreme difficulty walking. Inability to live alone safely. Signs of dementia, such as having trouble remembering things, doing daily tasks such as getting dressed, and problem solving. How is this diagnosed? This condition is diagnosed by a specialist. A diagnosis may be made based on symptoms, your medical history, and a physical exam.  You may also have brain imaging tests to check for loss of neurons in the  brain. How is this treated? There is no cure for Parkinson's disease. Treatment focuses on managing your symptoms. Treatment may include: Medicines. Everyone responds to medicines differently. Your response may change over time. Work with your health care provider to find the best medicines for you. Speech, occupational, and physical therapy. Deep brain stimulation surgery to reduce tremors and other involuntary movements. Follow these instructions at home: Medicines Take over-the-counter and prescription medicines only as told by your health care provider. Avoid taking medicines that can affect thinking, such as pain or sleeping medicines. Eating and drinking Follow instructions from your health care provider about eating or drinking restrictions. Do not drink alcohol. Activity Ask your health care provider if it is safe for you to drive. Do exercises as told by your health care provider or physical therapist. Lifestyle  Install grab bars and railings in your home to prevent falls. Do not use any products that contain nicotine or tobacco. These products include cigarettes, chewing tobacco, and vaping devices, such as e-cigarettes. If you need help quitting, ask your health care provider. Consider joining a support group for people with Parkinson's disease. General instructions Work with your health care provider to know the kind of day-to-day help that you may need and what to do to stay safe. Keep all follow-up visits. This is important. Follow-up visits include any visits with a physical therapist, speech therapist, or occupational therapist. Where to find more information [employer] of Neurological Disorders and Stroke: [employer]Box[employer] [place]: www.parkinson.org Contact a health care provider if: Medicines do not help your symptoms. You are unsteady or have fallen at home. You need more support to function well at home. You have trouble swallowing. You  have severe constipation. You are having problems with side effects from your medicines. You feel confused, anxious, depressed, or have hallucinations. Get help right away if you: Are injured after a fall. Cannot swallow without choking. Have chest pain or trouble breathing. Do not feel safe at home. Have thoughts about hurting yourself or others. These symptoms may represent a serious problem that is an emergency. Do not wait to see if the symptoms will go away. Get medical help right away. Call your local emergency services (911 in the [place] Do not drive yourself to the hospital. If you ever feel like you may hurt yourself or others, or have thoughts about taking your own life, get help right away. Go to your nearest emergency department or: Call your local emergency services (911 in the [place] Call a suicide crisis helpline, such as the [place] at 925-173-0180 or 988 in the [place]. This is open 24 hours a day in the [place] Text the Crisis Text Line at 580-668-1780 (in the [place].). Summary Parkinson's disease is a long-term condition that gets worse over time. This condition limits your ability to control your movements and move your body normally. There is no cure for Parkinson's disease. Treatment focuses on managing your symptoms. Work with your health care provider to know the kind of day-to-day help that you may need and what to do to stay safe. Keep all follow-up visits, including any visits with a physical therapist, speech therapist, or occupational therapist. This is important. This information is not intended to replace advice given to you by your health care provider. Make sure you discuss any questions you have with your health care provider. Document Revised: 03/28/2021 Document Reviewed:  12/18/2020 Elsevier Patient Education  2022 [employer].

## 2021-11-15 ENCOUNTER — Telehealth: Payer: Self-pay | Admitting: Neurology

## 2021-11-15 NOTE — Telephone Encounter (Signed)
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  ?

## 2021-11-15 NOTE — Telephone Encounter (Signed)
Patient called and stated he would like a call back.  He is needing an accomodation for work.  He stated he is sending a fax over. ?

## 2021-11-19 DIAGNOSIS — Z0279 Encounter for issue of other medical certificate: Secondary | ICD-10-CM

## 2021-12-11 ENCOUNTER — Telehealth: Payer: Self-pay | Admitting: Neurology

## 2021-12-11 DIAGNOSIS — G20 Parkinson's disease: Secondary | ICD-10-CM

## 2021-12-11 NOTE — Telephone Encounter (Signed)
Advised Patient he will need a FEC. Referral sent to [facility].  ? ? ?Pt advised.  ? ?

## 2021-12-11 NOTE — Telephone Encounter (Signed)
Caller states that the office was supposed to fill out paperwork and it has not been completed, its been 3 weeks. ?

## 2022-01-10 ENCOUNTER — Other Ambulatory Visit: Payer: Self-pay | Admitting: Family Medicine

## 2022-02-13 NOTE — Progress Notes (Unsigned)
NEUROLOGY FOLLOW UP OFFICE NOTE  John [name] [id number]  Assessment/Plan:   Parkinson disease  ***  Subjective:  John [name] is a 61 year old right-handed male with HTN who follows up for Parkinson disease.  UPDATE: Started carbidopa-levodopa 25/100mg  three times daily (4-5 AM, 12 PM, 10 PM).    He reports no improvement in symptoms.  Feet feel heavy.  Moving slow.  He notes his left leg feels cold and feels like it is twitching but doesn't see anything.  Notices it when sitting or standing but not while walking.     HISTORY: Since 2022, he started having balance problems with increased falls.  He was falling on a weekly basis.  Sometimes he loses his balance when his feet start moving quickly and the rest of his body cannot catch up.  He had a fall in September in which he bent over to pick something off of the ground and lost balance striking his face on the concrete step and sustaining a laceration to the nose.  He was seen in the ED where CT head and face personally reviewed revealed generalized cerebral atrophy and remote lacunar infarct in the anterior limb of the right internal capsule but no fractures or acute intracranial abnormality.  He feels like he is moving slowly.  He has more difficulty writing.  His handwriting is smaller.  He denies difficulty using utensils or chewing and swallowing.  He feels that he cannot speak loudly.  Denies tremor.  Denies REM sleep behavior disorder.  Sense of smell is good.  Has some chronic arthritic low back pain but no radicular pain or numbness in the legs.  Labs in November revealed B12 485, TSH 3.380 and non-reactive RPR.  No family history of Parkinson's disease or other neurologic disorder.  PAST MEDICAL HISTORY: Past Medical History:  Diagnosis Date   Hypertension    Stroke [facility])     MEDICATIONS: Current Outpatient Medications on File Prior to Visit  Medication Sig Dispense Refill   acetaminophen (TYLENOL) 500 MG tablet  Take 500 mg by mouth every 6 (six) hours as needed.     carbidopa-levodopa (SINEMET IR) 25-100 MG tablet Take 1/2 tablet twice daily for 7 days, then 1/2 tablet three times daily for 7 days, then 1 tablet three times daily 90 tablet 5   ibuprofen (ADVIL) 200 MG tablet Take 200 mg by mouth every 6 (six) hours as needed.     losartan-hydrochlorothiazide (HYZAAR) 50-12.5 MG tablet Take 1 tablet by mouth daily. 90 tablet 3   Multiple Vitamin (MULTIVITAMIN) tablet Take 1 tablet by mouth daily.     omeprazole (PRILOSEC OTC) 20 MG tablet Take 1 tablet (20 mg total) by mouth daily. 90 tablet 1   omeprazole (PRILOSEC) 20 MG capsule Take 1 capsule by mouth once daily 90 capsule 1   No current facility-administered medications on file prior to visit.    ALLERGIES: No Known Allergies  FAMILY HISTORY: Family History  Problem Relation Age of Onset   Cancer Father       Objective:  *** General: No acute distress.  Patient appears ***-groomed.   Head:  Normocephalic/atraumatic Eyes:  Fundi examined but not visualized Neck: supple, no paraspinal tenderness, full range of motion Heart:  Regular rate and rhythm Lungs:  Clear to auscultation bilaterally Back: No paraspinal tenderness Neurological Exam: alert and oriented to person, place, and time.  Speech fluent and not dysarthric, language intact.  CN II-XII intact. Bulk and tone normal,  muscle strength 5/5 throughout.  Sensation to light touch intact.  Deep tendon reflexes 2+ throughout, toes downgoing.  Finger to nose testing intact.  Gait normal, Romberg negative.   Metta Clines, DO  CC: Jill Alexanders, MD

## 2022-02-14 ENCOUNTER — Ambulatory Visit: Payer: BC Managed Care – PPO | Admitting: Neurology

## 2022-02-14 ENCOUNTER — Encounter: Payer: Self-pay | Admitting: Neurology

## 2022-02-14 VITALS — BP 134/84 | HR 73 | Ht 74.0 in | Wt 264.2 lb

## 2022-02-14 DIAGNOSIS — G20 Parkinson's disease: Secondary | ICD-10-CM | POA: Diagnosis not present

## 2022-02-14 MED ORDER — CARBIDOPA-LEVODOPA 25-100 MG PO TABS: 1.0000 | ORAL_TABLET | Freq: Four times a day (QID) | ORAL | 5 refills | Status: DC

## 2022-02-14 NOTE — Patient Instructions (Signed)
Increase carbidopa-levodopa to 1 tablet four times daily (at 5 AM, 12 PM, 5 PM and 10 PM) Refer to physical therapy for balance Refer to [facility] for FCE.  Contact us if you do not hear from them in a week Follow up with me in 3 months.

## 2022-02-15 ENCOUNTER — Encounter: Payer: Self-pay | Admitting: Neurology

## 2022-03-12 DIAGNOSIS — R2689 Other abnormalities of gait and mobility: Secondary | ICD-10-CM | POA: Insufficient documentation

## 2022-03-12 DIAGNOSIS — I890 Lymphedema, not elsewhere classified: Secondary | ICD-10-CM | POA: Insufficient documentation

## 2022-03-12 DIAGNOSIS — M6281 Muscle weakness (generalized): Secondary | ICD-10-CM | POA: Insufficient documentation

## 2022-03-20 ENCOUNTER — Telehealth: Payer: Self-pay | Admitting: Neurology

## 2022-03-20 NOTE — Telephone Encounter (Signed)
Talked to patient and need to send his FCE to [facility]

## 2022-03-20 NOTE — Telephone Encounter (Signed)
Called patient and left voicemail. Patient has to give medicine and PT time to work 3 month appointment is the first available on his schedule

## 2022-03-20 NOTE — Telephone Encounter (Signed)
This patient called and stated he needs to move his appointment up sooner.  He was seen in June.  He stated nothing has really changed?  I let him know I would send a message back.

## 2022-03-21 ENCOUNTER — Other Ambulatory Visit: Payer: Self-pay

## 2022-03-21 DIAGNOSIS — G20 Parkinson's disease: Secondary | ICD-10-CM

## 2022-03-21 NOTE — Telephone Encounter (Signed)
FCE sent to [facility]

## 2022-05-21 ENCOUNTER — Other Ambulatory Visit: Payer: Self-pay | Admitting: Neurology

## 2022-05-21 NOTE — Telephone Encounter (Signed)
Refill until appt 

## 2022-05-22 ENCOUNTER — Encounter: Payer: Self-pay | Admitting: Internal Medicine

## 2022-05-27 ENCOUNTER — Telehealth: Payer: Self-pay | Admitting: Neurology

## 2022-05-27 NOTE — Telephone Encounter (Signed)
Patient advised refill sent 05/21/22.

## 2022-05-27 NOTE — Telephone Encounter (Signed)
1. Which medications need refilled? (List name and dosage, if known) carbidopa-levodopa  2. Which pharmacy/location is medication to be sent to? (include street and city if local pharmacy) [address]  Pt is completely out of pills

## 2022-05-28 NOTE — Progress Notes (Signed)
NEUROLOGY FOLLOW UP OFFICE NOTE  John [name] [id number]  Assessment/Plan:   Parkinson disease - minimal improvement on levodopa so far (slightly reduced rigidity in elbows and no longer significant in wrists)  Increase carbidopa-levodopa 25/100mg  to 1.5 tablets four times daily (5 AM, 12 PM, 5 PM, 10 PM) Continue home exercises Discussed sleep hygiene Follow up 6 months.  Subjective:  John [name] is a 61 year old right-handed male with HTN who follows up for Parkinson disease.  UPDATE: Last visit, increased carbidopa-levodopa 25/100mg  to four times daily (5 AM, 12 PM, 5 PM, 10 PM.  He may take an extra one during the day because of spasms in the legs and levodopa helps).   He was also referred to physical therapy.  He went once but it was too expensive.  He was given a copy of exercises to strengthen his legs which he does 5-6 days a week.  Sleep is poor.  Always been a poor sleeper.  Sleeps 3-4 hours.  Keeps TV on.     HISTORY: Since 2022, he started having balance problems with increased falls.  He was falling on a weekly basis.  Sometimes he loses his balance when his feet start moving quickly and the rest of his body cannot catch up.  He had a fall in September in which he bent over to pick something off of the ground and lost balance striking his face on the concrete step and sustaining a laceration to the nose.  He was seen in the ED where CT head and face personally reviewed revealed generalized cerebral atrophy and remote lacunar infarct in the anterior limb of the right internal capsule but no fractures or acute intracranial abnormality.  He feels like he is moving slowly.  He has more difficulty writing.  His handwriting is smaller.  He denies difficulty using utensils or chewing and swallowing.  He feels that he cannot speak loudly.  Denies tremor.  Denies REM sleep behavior disorder.  Sense of smell is good.  Has some chronic arthritic low back pain but no radicular  pain or numbness in the legs.  Labs in November revealed B12 485, TSH 3.380 and non-reactive RPR.  No family history of Parkinson's disease or other neurologic disorder.  PAST MEDICAL HISTORY: Past Medical History:  Diagnosis Date   Hypertension    Stroke [facility])     MEDICATIONS: Current Outpatient Medications on File Prior to Visit  Medication Sig Dispense Refill   acetaminophen (TYLENOL) 500 MG tablet Take 500 mg by mouth every 6 (six) hours as needed.     carbidopa-levodopa (SINEMET IR) 25-100 MG tablet TAKE 1 TABLET BY MOUTH 4 TIMES DAILY AT  5AM,  12PM,  5PM,  AND  10PM 20 tablet 0   ibuprofen (ADVIL) 200 MG tablet Take 200 mg by mouth every 6 (six) hours as needed.     losartan-hydrochlorothiazide (HYZAAR) 50-12.5 MG tablet Take 1 tablet by mouth daily. 90 tablet 3   Multiple Vitamin (MULTIVITAMIN) tablet Take 1 tablet by mouth daily.     omeprazole (PRILOSEC OTC) 20 MG tablet Take 1 tablet (20 mg total) by mouth daily. 90 tablet 1   No current facility-administered medications on file prior to visit.    ALLERGIES: No Known Allergies  FAMILY HISTORY: Family History  Problem Relation Age of Onset   Cancer Father       Objective:  Blood pressure 111/72, pulse 76, height 6\' 2"  (1.88 m), weight 268 lb 12.8 oz (  121.9 kg), SpO2 99 %. General: No acute distress.  Patient appears well-groomed.   Head:  Normocephalic/atraumatic Eyes:  Fundi examined but not visualized Neck: supple, no paraspinal tenderness, full range of motion Heart:  Regular rate and rhythm Neurological Exam: alert and oriented to person, place, and time.  Speech fluent and not dysarthric, language intact.  Hypomimia.  CN II-XII intact.slight increased tone in elbows, mild increased tone in right wrist, bradykinesia, muscle strength 5/5 throughout. No tremor.  Sensation to light touch intact.  Deep tendon reflexes 2+ throughout, toes downgoing.  Finger to nose testing intact.  Slight flexed posture, reduced arm  swing bilaterally, Reduced stride/shuffling.  Romberg with sway.  Retropulsion test negative.   John Millet, DO  CC: Sharlot Gowda, MD

## 2022-05-30 ENCOUNTER — Ambulatory Visit: Payer: BC Managed Care – PPO | Admitting: Neurology

## 2022-05-30 ENCOUNTER — Encounter: Payer: Self-pay | Admitting: Neurology

## 2022-05-30 VITALS — BP 111/72 | HR 76 | Ht 74.0 in | Wt 268.8 lb

## 2022-05-30 DIAGNOSIS — G20 Parkinson's disease: Secondary | ICD-10-CM

## 2022-05-30 MED ORDER — CARBIDOPA-LEVODOPA 25-100 MG PO TABS: 1.5000 | ORAL_TABLET | Freq: Four times a day (QID) | ORAL | 5 refills | Status: DC

## 2022-05-30 NOTE — Patient Instructions (Signed)
Increase levodopa to 1 1/2 tablets four times daily as directed Continue exercises Start recommendations for proper sleep hygiene Follow up 6 months.

## 2022-07-08 ENCOUNTER — Encounter: Payer: Self-pay | Admitting: Internal Medicine

## 2022-07-14 ENCOUNTER — Other Ambulatory Visit: Payer: Self-pay | Admitting: Family Medicine

## 2022-08-26 ENCOUNTER — Other Ambulatory Visit: Payer: Self-pay | Admitting: Neurology

## 2022-08-26 MED ORDER — CARBIDOPA-LEVODOPA 25-100 MG PO TABS: 1.5000 | ORAL_TABLET | Freq: Four times a day (QID) | ORAL | 5 refills | Status: DC

## 2022-08-26 NOTE — Telephone Encounter (Signed)
Pt needs a refill on his carbidopa-levo 25-100mg  [facility] [phone]

## 2022-08-26 NOTE — Telephone Encounter (Signed)
LMOVM, new script sent 05/2022 with 5 refills.  Spoke to the pharmacy patient pick up 540 tablets. Patient should have enough pills. Patient was advised by the pharmacy as well he should have enough pills to last until his next pick up 09/02/22.    Per pharmacy he will need a new script. Advised pharmacy the script sent 9/23 was written with 5  refills not a 1 90 day script. It was changed on their end not ours.

## 2022-09-04 ENCOUNTER — Encounter: Payer: Self-pay | Admitting: Internal Medicine

## 2022-11-05 ENCOUNTER — Encounter: Payer: BC Managed Care – PPO | Admitting: Family Medicine

## 2022-11-07 ENCOUNTER — Telehealth: Payer: Self-pay | Admitting: Family Medicine

## 2022-11-07 NOTE — Telephone Encounter (Signed)
P.A. OMEPRAZOLE approved til 05/01/23, sent mychart message

## 2022-11-27 NOTE — Progress Notes (Unsigned)
NEUROLOGY FOLLOW UP OFFICE NOTE  LONN LUGINBILL [id number]  Assessment/Plan:   Idiopathic Parkinson's disease.  Carbidopa-levodopa 25/'100mg'$  to 1.5 tablets four times daily (5 AM, 12 PM, 5 PM, 10 PM) Continue home exercises Discussed sleep hygiene Follow up ***  Subjective:  John [name] is a 62 year old right-handed male with HTN who follows up for Parkinson disease.  UPDATE: Last visit, increased carbidopa-levodopa 25/'100mg'$  to 1.5 tablets four times daily (5 AM, 12 PM, 5 PM, 10 PM.  ***    HISTORY: Since 2022, he started having balance problems with increased falls.  He was falling on a weekly basis.  Sometimes he loses his balance when his feet start moving quickly and the rest of his body cannot catch up.  He had a fall in September in which he bent over to pick something off of the ground and lost balance striking his face on the concrete step and sustaining a laceration to the nose.  He was seen in the ED where CT head and face personally reviewed revealed generalized cerebral atrophy and remote lacunar infarct in the anterior limb of the right internal capsule but no fractures or acute intracranial abnormality.  He feels like he is moving slowly.  He has more difficulty writing.  His handwriting is smaller.  He denies difficulty using utensils or chewing and swallowing.  He feels that he cannot speak loudly.  Denies tremor.  Denies REM sleep behavior disorder.  Sense of smell is good.  Has some chronic arthritic low back pain but no radicular pain or numbness in the legs.  Labs in November revealed B12 485, TSH 3.380 and non-reactive RPR.  No family history of Parkinson's disease or other neurologic disorder.  PAST MEDICAL HISTORY: Past Medical History:  Diagnosis Date   Hypertension    Stroke [facility])     MEDICATIONS: Current Outpatient Medications on File Prior to Visit  Medication Sig Dispense Refill   acetaminophen (TYLENOL) 500 MG tablet Take 500 mg by mouth every  6 (six) hours as needed.     carbidopa-levodopa (SINEMET) 25-100 MG tablet Take 1.5 tablets by mouth 4 (four) times daily. Take at 4-5 AM, 11 AM-12 PM, 4-5 PM and 9-10 PM 180 tablet 5   ibuprofen (ADVIL) 200 MG tablet Take 200 mg by mouth every 6 (six) hours as needed.     losartan-hydrochlorothiazide (HYZAAR) 50-12.5 MG tablet Take 1 tablet by mouth daily. 90 tablet 3   Multiple Vitamin (MULTIVITAMIN) tablet Take 1 tablet by mouth daily.     omeprazole (PRILOSEC OTC) 20 MG tablet Take 1 tablet (20 mg total) by mouth daily. 90 tablet 1   omeprazole (PRILOSEC) 20 MG capsule Take 1 capsule by mouth once daily 90 capsule 1   No current facility-administered medications on file prior to visit.    ALLERGIES: No Known Allergies  FAMILY HISTORY: Family History  Problem Relation Age of Onset   Cancer Father       Objective:  *** General: No acute distress.  Patient appears well-groomed.   Head:  Normocephalic/atraumatic Eyes:  Fundi examined but not visualized Neck: supple, no paraspinal tenderness, full range of motion Heart:  Regular rate and rhythm Neurological Exam: Alert and oriented.  Speech fluent and not dysarthric.  Language intact.  Hypomimia.  CN II-XII intact.  Slight increased tone in elbows, mild increased tone in right wrist; bradykinesia; muscle strength 5/5 throughout.  No tremor observed.  Sensation to light touch intact.  Deep tendon reflexes  2+ throughout.  Finger to nose testing intact.  Slight flexed posture with reduced arm swing bilaterally and shuffling/reduced stride.  Romberg with sway.     Metta Clines, DO  CC: Jill Alexanders, MD

## 2022-11-28 ENCOUNTER — Ambulatory Visit: Payer: BC Managed Care – PPO | Admitting: Neurology

## 2022-11-28 ENCOUNTER — Encounter: Payer: BC Managed Care – PPO | Admitting: Family Medicine

## 2022-11-28 ENCOUNTER — Encounter: Payer: Self-pay | Admitting: Neurology

## 2022-11-28 VITALS — BP 102/62 | HR 89 | Ht 74.0 in | Wt 289.0 lb

## 2022-11-28 DIAGNOSIS — G20A2 Parkinson's disease without dyskinesia, with fluctuations: Secondary | ICD-10-CM

## 2022-11-28 MED ORDER — CARBIDOPA-LEVODOPA ER 50-200 MG PO TBCR: 1.0000 | EXTENDED_RELEASE_TABLET | Freq: Every evening | ORAL | 5 refills | Status: DC

## 2022-11-28 MED ORDER — CARBIDOPA-LEVODOPA 25-100 MG PO TABS: ORAL_TABLET | ORAL | 5 refills | Status: DC

## 2022-11-28 NOTE — Patient Instructions (Signed)
Take carbidopa-levodopa 25/'100mg'$  1.5 tablets at 4-5 AM, 8 AM, 11 AM, 2 PM, and 5 PM Take carbidopa-levodopa 50/'200mg'$  extended release 1 tablet at 8 PM

## 2022-12-02 DIAGNOSIS — G20A2 Parkinson's disease without dyskinesia, with fluctuations: Secondary | ICD-10-CM

## 2022-12-02 HISTORY — DX: Parkinson's disease without dyskinesia, with fluctuations: G20.A2

## 2022-12-03 ENCOUNTER — Ambulatory Visit (INDEPENDENT_AMBULATORY_CARE_PROVIDER_SITE_OTHER): Payer: BC Managed Care – PPO | Admitting: Family Medicine

## 2022-12-03 ENCOUNTER — Encounter: Payer: Self-pay | Admitting: Family Medicine

## 2022-12-03 VITALS — BP 110/74 | HR 69 | Temp 98.0°F | Ht 74.0 in | Wt 287.6 lb

## 2022-12-03 DIAGNOSIS — E669 Obesity, unspecified: Secondary | ICD-10-CM

## 2022-12-03 DIAGNOSIS — K219 Gastro-esophageal reflux disease without esophagitis: Secondary | ICD-10-CM | POA: Diagnosis not present

## 2022-12-03 DIAGNOSIS — Z0000 Encounter for general adult medical examination without abnormal findings: Secondary | ICD-10-CM | POA: Diagnosis not present

## 2022-12-03 DIAGNOSIS — Z8042 Family history of malignant neoplasm of prostate: Secondary | ICD-10-CM

## 2022-12-03 DIAGNOSIS — Z1211 Encounter for screening for malignant neoplasm of colon: Secondary | ICD-10-CM

## 2022-12-03 DIAGNOSIS — I10 Essential (primary) hypertension: Secondary | ICD-10-CM | POA: Diagnosis not present

## 2022-12-03 DIAGNOSIS — G20A2 Parkinson's disease without dyskinesia, with fluctuations: Secondary | ICD-10-CM

## 2022-12-03 DIAGNOSIS — Z125 Encounter for screening for malignant neoplasm of prostate: Secondary | ICD-10-CM

## 2022-12-03 DIAGNOSIS — H9313 Tinnitus, bilateral: Secondary | ICD-10-CM

## 2022-12-03 LAB — POCT URINALYSIS DIP (PROADVANTAGE DEVICE)
Bilirubin, UA: NEGATIVE
Blood, UA: NEGATIVE
Glucose, UA: NEGATIVE mg/dL
Ketones, POC UA: NEGATIVE mg/dL
Leukocytes, UA: NEGATIVE
Nitrite, UA: NEGATIVE
Protein Ur, POC: NEGATIVE mg/dL
Specific Gravity, Urine: 1.015
Urobilinogen, Ur: 0.2
pH, UA: 6.5 (ref 5.0–8.0)

## 2022-12-03 MED ORDER — LOSARTAN POTASSIUM-HCTZ 50-12.5 MG PO TABS: 1.0000 | ORAL_TABLET | Freq: Every day | ORAL | 3 refills | Status: DC

## 2022-12-03 MED ORDER — OMEPRAZOLE 20 MG PO CPDR: 20.0000 mg | DELAYED_RELEASE_CAPSULE | Freq: Every day | ORAL | 3 refills | Status: DC

## 2022-12-03 NOTE — Progress Notes (Signed)
Complete physical exam  Patient: John [name]   DOB: August 12, 1961   62 y.o. Male  MRN: [id number]  Subjective:    Chief Complaint  Patient presents with   Annual Exam    No additional concerns.     John [name] is a 62 y.o. male who presents today for a complete physical exam. He reports consuming a general diet. The patient does not participate in regular exercise at present. He generally feels fairly well. He reports sleeping poorly. He does have additional problems to discuss today (ringing in both ears).  He continues on Sinemet and does think that it helps him.  He is trying to apply for disability because of this.  Continues on Prilosec for his reflux symptoms.  He is also taking losartan/HCTZ for his blood pressure.  Does have a family history of prostate cancer.  He does complain of watery eyes but no sneezing or itching or other allergy type symptoms.  Otherwise his family and social history as well as health maintenance and immunizations was reviewed   Most recent fall risk assessment:    11/28/2022   10:47 AM  [place] in the past year? 0  Number falls in past yr: 0  Injury with Fall? 0  Follow up Falls evaluation completed     Most recent depression screenings:    12/03/2022   11:13 AM 10/29/2021    1:58 PM  PHQ 2/9 Scores  PHQ - 2 Score 0 0    Vision:Within last year and Dental: No regular dental care     Patient Care Team: Denita Lung, MD as PCP - General (Family Medicine) Pieter Partridge, DO as Consulting Physician (Neurology)   Outpatient Medications Prior to Visit  Medication Sig   acetaminophen (TYLENOL) 500 MG tablet Take 500 mg by mouth every 6 (six) hours as needed.   carbidopa-levodopa (SINEMET CR) 50-200 MG tablet Take 1 tablet by mouth every evening. At 8 PM   carbidopa-levodopa (SINEMET IR) 25-100 MG tablet Take 1.5 tablets 5 times daily (at 4-5 AM, 8 AM, 11 AM, 2 PM, 5 PM)   ibuprofen (ADVIL) 200 MG tablet Take 200 mg by mouth  every 6 (six) hours as needed.   Multiple Vitamin (MULTIVITAMIN) tablet Take 1 tablet by mouth daily.   [DISCONTINUED] omeprazole (PRILOSEC OTC) 20 MG tablet Take 1 tablet (20 mg total) by mouth daily.   [DISCONTINUED] losartan-hydrochlorothiazide (HYZAAR) 50-12.5 MG tablet Take 1 tablet by mouth daily.   [DISCONTINUED] omeprazole (PRILOSEC) 20 MG capsule Take 1 capsule by mouth once daily   No facility-administered medications prior to visit.    Review of Systems  All other systems reviewed and are negative.         Objective:     BP 110/74   Pulse 69   Temp 98 F (36.7 C) (Oral)   Ht 6\' 2"  (1.88 m)   Wt 287 lb 9.6 oz (130.5 kg)   SpO2 98% Comment: room air  BMI 36.93 kg/m    Physical Exam  Alert and in no distress. Tympanic membranes and canals are normal. Pharyngeal area is normal. Neck is supple without adenopathy or thyromegaly. Cardiac exam shows a regular sinus rhythm without murmurs or gallops. Lungs are clear to auscultation.  Results for orders placed or performed in visit on 12/03/22  POCT Urinalysis DIP (Proadvantage Device)  Result Value Ref Range   Color, UA yellow yellow   Clarity, UA clear clear  Glucose, UA negative negative mg/dL   Bilirubin, UA negative negative   Ketones, POC UA negative negative mg/dL   Specific Gravity, Urine 1.015    Blood, UA negative negative   pH, UA 6.5 5.0 - 8.0   Protein Ur, POC negative negative mg/dL   Urobilinogen, Ur 0.2    Nitrite, UA Negative Negative   Leukocytes, UA Negative Negative       Assessment & Plan:    Routine general medical examination at a health care facility - Plan: CBC with Differential/Platelet, Comprehensive metabolic panel, Lipid panel, POCT Urinalysis DIP (Proadvantage Device)  Essential hypertension - Plan: CBC with Differential/Platelet, Comprehensive metabolic panel, losartan-hydrochlorothiazide (HYZAAR) 50-12.5 MG tablet  Gastroesophageal reflux disease without esophagitis - Plan:  omeprazole (PRILOSEC) 20 MG capsule  Obesity (BMI 30.0-34.9) - Plan: CBC with Differential/Platelet, Comprehensive metabolic panel, Lipid panel  Family history of prostate cancer - Plan: PSA  Parkinson's disease with fluctuating manifestations, unspecified whether dyskinesia present  Screening for colon cancer - Plan: Cologuard  Screening for prostate cancer - Plan: PSA  Tinnitus of both ears I encouraged him to be as active as he possibly can.  Discussed cutting back on carbohydrates.  Continue on his present medications.  Explained that we really have no therapies for the tinnitus.  If his eyes continue to bother him, an ophthalmology evaluation would be warranted.  He is to use the Prilosec on an every other day basis or even less often than that help control his reflux symptoms. 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 03/09/2020, 08/14/2020   Pfizer Covid-19 Vaccine Bivalent Booster 52yrs & up 07/27/2021   Tdap 07/31/2020   Zoster Recombinat (Shingrix) 07/31/2020, 10/09/2020    Health Maintenance  Topic Date Due   HIV Screening  Never done   INFLUENZA VACCINE  12/15/2022 (Originally 04/16/2022)   COVID-19 Vaccine (4 - 2023-24 season) 12/19/2022 (Originally 05/17/2022)   Fecal DNA (Cologuard)  03/24/2023   DTaP/Tdap/Td (2 - Td or Tdap) 07/31/2030   Hepatitis C Screening  Completed   Zoster Vaccines- Shingrix  Completed   HPV VACCINES  Aged Out    Discussed health benefits of physical activity, and encouraged him to engage in regular exercise appropriate for his age and condition.  Problem List Items Addressed This Visit     Essential hypertension   Relevant Medications   losartan-hydrochlorothiazide (HYZAAR) 50-12.5 MG tablet   Other Relevant Orders   CBC with Differential/Platelet   Comprehensive metabolic panel   Family history of prostate cancer   Relevant Orders   PSA   Gastroesophageal reflux disease without  esophagitis   Relevant Medications   omeprazole (PRILOSEC) 20 MG capsule   Obesity (BMI 30.0-34.9)   Relevant Orders   CBC with Differential/Platelet   Comprehensive metabolic panel   Lipid panel   Parkinson's disease with fluctuating manifestations   Routine general medical examination at a health care facility - Primary   Relevant Orders   CBC with Differential/Platelet   Comprehensive metabolic panel   Lipid panel   POCT Urinalysis DIP (Proadvantage Device) (Completed)   Other Visit Diagnoses     Screening for colon cancer       Relevant Orders   Cologuard   Screening for prostate cancer       Relevant Orders   PSA   Tinnitus of both ears          Follow-up 1 year    Jill Alexanders, MD

## 2022-12-04 LAB — COMPREHENSIVE METABOLIC PANEL
ALT: 6 IU/L (ref 0–44)
AST: 12 IU/L (ref 0–40)
Albumin/Globulin Ratio: 2 (ref 1.2–2.2)
Albumin: 4.1 g/dL (ref 3.9–4.9)
Alkaline Phosphatase: 87 IU/L (ref 44–121)
BUN/Creatinine Ratio: 14 (ref 10–24)
BUN: 19 mg/dL (ref 8–27)
Bilirubin Total: 0.4 mg/dL (ref 0.0–1.2)
CO2: 21 mmol/L (ref 20–29)
Calcium: 9.4 mg/dL (ref 8.6–10.2)
Chloride: 105 mmol/L (ref 96–106)
Creatinine, Ser: 1.39 mg/dL — ABNORMAL HIGH (ref 0.76–1.27)
Globulin, Total: 2.1 g/dL (ref 1.5–4.5)
Glucose: 101 mg/dL — ABNORMAL HIGH (ref 70–99)
Potassium: 3.4 mmol/L — ABNORMAL LOW (ref 3.5–5.2)
Sodium: 140 mmol/L (ref 134–144)
Total Protein: 6.2 g/dL (ref 6.0–8.5)
eGFR: 57 mL/min/{1.73_m2} — ABNORMAL LOW (ref >59)

## 2022-12-04 LAB — CBC WITH DIFFERENTIAL/PLATELET
Basophils Absolute: 0.1 10*3/uL (ref 0.0–0.2)
Basos: 2 %
EOS (ABSOLUTE): 0.3 10*3/uL (ref 0.0–0.4)
Eos: 6 %
Hematocrit: 34.9 % — ABNORMAL LOW (ref 37.5–51.0)
Hemoglobin: 11.3 g/dL — ABNORMAL LOW (ref 13.0–17.7)
Immature Grans (Abs): 0 10*3/uL (ref 0.0–0.1)
Immature Granulocytes: 0 %
Lymphocytes Absolute: 1.6 10*3/uL (ref 0.7–3.1)
Lymphs: 30 %
MCH: 26.5 pg — ABNORMAL LOW (ref 26.6–33.0)
MCHC: 32.4 g/dL (ref 31.5–35.7)
MCV: 82 fL (ref 79–97)
Monocytes Absolute: 0.4 10*3/uL (ref 0.1–0.9)
Monocytes: 7 %
Neutrophils Absolute: 2.9 10*3/uL (ref 1.4–7.0)
Neutrophils: 55 %
Platelets: 271 10*3/uL (ref 150–450)
RBC: 4.27 x10E6/uL (ref 4.14–5.80)
RDW: 13.8 % (ref 11.6–15.4)
WBC: 5.3 10*3/uL (ref 3.4–10.8)

## 2022-12-04 LAB — LIPID PANEL
Chol/HDL Ratio: 3.1 ratio (ref 0.0–5.0)
Cholesterol, Total: 153 mg/dL (ref 100–199)
HDL: 49 mg/dL (ref >39)
LDL Chol Calc (NIH): 87 mg/dL (ref 0–99)
Triglycerides: 90 mg/dL (ref 0–149)
VLDL Cholesterol Cal: 17 mg/dL (ref 5–40)

## 2022-12-04 LAB — PSA: Prostate Specific Ag, Serum: 0.6 ng/mL (ref 0.0–4.0)

## 2022-12-18 ENCOUNTER — Telehealth: Payer: Self-pay | Admitting: Family Medicine

## 2022-12-18 NOTE — Telephone Encounter (Signed)
DDS record request forwarded to [facility] 

## 2022-12-30 ENCOUNTER — Ambulatory Visit: Payer: BC Managed Care – PPO | Admitting: Medical

## 2022-12-30 ENCOUNTER — Telehealth: Payer: Self-pay

## 2022-12-30 VITALS — BP 110/70 | HR 87 | Temp 98.5°F | Wt 288.0 lb

## 2022-12-30 DIAGNOSIS — L84 Corns and callosities: Secondary | ICD-10-CM | POA: Diagnosis not present

## 2022-12-30 NOTE — Patient Instructions (Addendum)
Corn of foot  For the time being I recommend you try some corn pads to cushion the area on the foot Consider over-the-counter wart remover topical treatment you can put on the area every nighttime at bedtime.  You need to put the medicine specifically on the corn though Wear thicker socks for now such as boots and socks Make sure your shoe has adequate padding on that outside portion or either try different shoe that may have thicker sole support The other option is referral either back to foot doctor or orthopedics for other treatment options    Corns and Calluses Corns are small areas of thickened skin that form on the top, sides, or tip of a toe. Corns have a cone-shaped core with a point that can press on a nerve below. This causes pain. Calluses are areas of thickened skin that can form anywhere on the body, including the hands, fingers, palms, soles of the feet, and heels. Calluses are usually larger than corns. What are the causes? Corns and calluses are caused by rubbing (friction) or pressure, such as from shoes that are too tight or do not fit properly. What increases the risk? Corns are more likely to develop in people who have misshapen toes (toe deformities), such as hammer toes. Calluses can form with friction to any area of the skin. They are more likely to develop in people who: Work with their hands. Wear shoes that fit poorly, are too tight, or are high-heeled. Have toe deformities. What are the signs or symptoms? Symptoms of a corn or callus include: A hard growth on the skin. Pain or tenderness under the skin. Redness and swelling. Increased discomfort while wearing tight-fitting shoes, if your feet are affected. If a corn or callus becomes infected, symptoms may include: Redness and swelling that gets worse. Pain. Fluid, blood, or pus draining from the corn or callus. How is this diagnosed? Corns and calluses may be diagnosed based on your symptoms, your medical  history, and a physical exam. How is this treated? Treatment for corns and calluses may include: Removing the cause of the friction or pressure. This may involve: Changing your shoes. Wearing shoe inserts (orthotics) or other protective layers in your shoes, such as a corn pad. Wearing gloves. Applying medicine to the skin (topical medicine) to help soften skin in the hardened, thickened areas. Removing layers of dead skin with a file to reduce the size of the corn or callus. Removing the corn or callus with a scalpel or laser. Taking antibiotic medicines, if your corn or callus is infected. Having surgery, if a toe deformity is the cause. Follow these instructions at home:  Take over-the-counter and prescription medicines only as told by your health care provider. If you were prescribed an antibiotic medicine, take it as told by your health care provider. Do not stop taking it even if your condition improves. Wear shoes that fit well. Avoid wearing high-heeled shoes and shoes that are too tight or too loose. Wear any padding, protective layers, gloves, or orthotics as told by your health care provider. Soak your hands or feet. Then use a file or pumice stone to soften your corn or callus. Do this as told by your health care provider. Check your corn or callus every day for signs of infection. Contact a health care provider if: Your symptoms do not improve with treatment. You have redness or swelling that gets worse. Your corn or callus becomes painful. You have fluid, blood, or pus coming  from your corn or callus. You have new symptoms. Get help right away if: You develop severe pain with redness. Summary Corns are small areas of thickened skin that form on the top, sides, or tip of a toe. These can be painful. Calluses are areas of thickened skin that can form anywhere on the body, including the hands, fingers, palms, and soles of the feet. Calluses are usually larger than  corns. Corns and calluses are caused by rubbing (friction) or pressure, such as from shoes that are too tight or do not fit properly. Treatment may include wearing padding, protective layers, gloves, or orthotics as told by your health care provider. This information is not intended to replace advice given to you by your health care provider. Make sure you discuss any questions you have with your health care provider. Document Revised: 12/30/2019 Document Reviewed: 12/30/2019 Elsevier Patient Education  2023 [employer].

## 2022-12-30 NOTE — Telephone Encounter (Signed)
Patient is calling in stating that he is completley out of all Sentiment, went to the pharmacy and they need a PA done for the 25 and has lost the extend release of the Sentiment as well but believes that he has it at work. Will call if he can not find the extended release.

## 2022-12-30 NOTE — Progress Notes (Signed)
Subjective:  John [name] is a 62 y.o. male who presents for Chief Complaint  Patient presents with   left foot pain on heel    Left foot pain on heel x 1 week ago and pain is getting worse. Swelling and burning     Here for tender spot of left lateral foot.  This has been going on for at least a week.  It hurts to put pressure on the area.  He has not tried any treatment.  He lives alone and cannot really see his foot or get down to the mom's foot to do any type of treatment.  He works at [employer] and is on his feet for long periods.  No other aggravating or relieving factors.    No other c/o.  Past Medical History:  Diagnosis Date   Hypertension    Stroke [facility])    Current Outpatient Medications on File Prior to Visit  Medication Sig Dispense Refill   acetaminophen (TYLENOL) 500 MG tablet Take 500 mg by mouth every 6 (six) hours as needed.     carbidopa-levodopa (SINEMET CR) 50-200 MG tablet Take 1 tablet by mouth at bedtime.     carbidopa-levodopa (SINEMET IR) 25-100 MG tablet Take 1.5 tablets 5 times daily (at 4-5 AM, 8 AM, 11 AM, 2 PM, 5 PM) 225 tablet 5   ibuprofen (ADVIL) 200 MG tablet Take 200 mg by mouth every 6 (six) hours as needed.     losartan-hydrochlorothiazide (HYZAAR) 50-12.5 MG tablet Take 1 tablet by mouth daily. 90 tablet 3   Multiple Vitamin (MULTIVITAMIN) tablet Take 1 tablet by mouth daily.     omeprazole (PRILOSEC) 20 MG capsule Take 1 capsule (20 mg total) by mouth daily. 90 capsule 3   No current facility-administered medications on file prior to visit.    The following portions of the patient's history were reviewed and updated as appropriate: allergies, current medications, past family history, past medical history, past social history, past surgical history and problem list.  ROS Otherwise as in subjective above  Objective: BP 110/70   Pulse 87   Temp 98.5 F (36.9 C)   Wt 288 lb (130.6 kg)   SpO2 97%   BMI 36.98 kg/m   General appearance:  alert, no distress, well developed, well nourished Left foot volar surface laterally over the MCP region of the fifth digit with 2 separate corns that are tender to palpation, otherwise flatfeet, no other lesion or abnormality Pulses: 2+ radial pulses, 2+ pedal pulses, normal cap refill Ext: no edema   Assessment: Encounter Diagnosis  Name Primary?   Corn of foot Yes     Plan: We discussed the symptoms and findings.  We discussed possible treatment options.  Since he needs to be out of work later this week he did not want to do cryotherapy today that could possibly be a blister.  We discussed topical wart remover.  We discussed referral to podiatry or Ortho or other.  We discussed using donut pads to help ease pressure off the corn.  We discussed thicker sock wear and supportive shoe wear.  He declines referral but will let me know if he wants to go.  He has seen podiatry before but did not necessarily want to go that route.  John [name] was seen today for left foot pain on heel.  Diagnoses and all orders for this visit:  Corn of foot   Follow up: prn

## 2023-01-06 ENCOUNTER — Telehealth: Payer: Self-pay | Admitting: Neurology

## 2023-01-06 NOTE — Telephone Encounter (Signed)
LMOVM for patient,  Doubling the dose of the ER is too much.  What time does he go to bed?

## 2023-01-06 NOTE — Telephone Encounter (Signed)
Pt called in and left a message with the access nurse on 01/04/23. Pt is needing a refill on his Sinemet ER. Says he has refills, but the pharmacy won't refill it early at the increased dosage he has initiated himself. He says he takes one tab at 8 pm (has been taking 2 tabs at a time sometimes).  Full access nurse report is in Dr. Moises Blood box

## 2023-01-13 NOTE — Telephone Encounter (Signed)
Tried calling patient no answer.

## 2023-01-21 ENCOUNTER — Other Ambulatory Visit (HOSPITAL_COMMUNITY): Payer: Self-pay

## 2023-01-22 NOTE — Telephone Encounter (Signed)
Checking to see if patient able to pick up his medication.

## 2023-01-24 ENCOUNTER — Telehealth: Payer: Self-pay | Admitting: Neurology

## 2023-01-24 NOTE — Telephone Encounter (Signed)
Tried retuning patient call, No answer, Mychart message sent.   He is taking ER at bedtime.  The IR is better to take during the day:  Carbidopa-levodopa 25/100mg  IR 1.5 tablets five times daily (4-5 AM, 8 AM, 11 AM, 2 PM, 5 PM) Carbidopa-levodopa 50/200mg  CR 1 tablet at 8 PM

## 2023-01-24 NOTE — Telephone Encounter (Signed)
F/u   Returning call back to CMA

## 2023-01-24 NOTE — Telephone Encounter (Signed)
Pt called in and left a message. He is returning a call he got yesterday

## 2023-01-24 NOTE — Telephone Encounter (Signed)
Patient wanted to know if he could start the Extended Release instead.

## 2023-01-27 NOTE — Telephone Encounter (Signed)
Pt called in returning Sheena's call 

## 2023-04-30 ENCOUNTER — Other Ambulatory Visit: Payer: Self-pay | Admitting: Neurology

## 2023-05-08 ENCOUNTER — Telehealth: Payer: Self-pay | Admitting: Neurology

## 2023-05-08 ENCOUNTER — Other Ambulatory Visit: Payer: Self-pay | Admitting: Neurology

## 2023-05-08 MED ORDER — CARBIDOPA-LEVODOPA 25-100 MG PO TABS: ORAL_TABLET | ORAL | 0 refills | Status: DC

## 2023-05-08 NOTE — Telephone Encounter (Signed)
Patient is calling for medication refill on levodopa-carbidopa 25-100mg  ,please send to [facility] on [address]

## 2023-05-08 NOTE — Telephone Encounter (Signed)
Per Pharmacy patient picked up medication 04/17/23.  So he is due to for medication before his follow up visit 06/05/23.

## 2023-05-29 ENCOUNTER — Telehealth: Payer: Self-pay | Admitting: Neurology

## 2023-05-29 DIAGNOSIS — G20A2 Parkinson's disease without dyskinesia, with fluctuations: Secondary | ICD-10-CM

## 2023-05-30 ENCOUNTER — Other Ambulatory Visit: Payer: Self-pay

## 2023-05-30 NOTE — Telephone Encounter (Signed)
RX refill was in our [place] approved refill and sent

## 2023-05-30 NOTE — Telephone Encounter (Signed)
1. Which medications need refilled? (List name and dosage, if known) carbidopa-levodopa  2. Which pharmacy/location is medication to be sent to? (include street and city if local pharmacy) [facility]

## 2023-06-03 NOTE — Progress Notes (Unsigned)
NEUROLOGY FOLLOW UP OFFICE NOTE  [name] [id number]  Assessment/Plan:   Idiopathic Parkinson's disease.  May have a little restless leg at night too.    Carbidopa-levodopa 25/100mg  to 1.5 tablets five times daily (4-5 AM, 8 AM, 11 AM, 2 PM, 5 PM); carbidopa-levodopa 50/200mg  CR 1 tablet at 8 PM Continue home exercises Follow up 6 months. ***  Subjective:  HARLES [name] is a 62 year old right-handed male with HTN who follows up for Parkinson disease.  UPDATE: Carbidopa-levodopa 25/100mg  to 1.5 tablets five times daily (4-5 AM, 8 AM, 11 AM, 2 PM, 5 PM); carbidopa-levodopa 50/200mg  CR at 8 PM  ***    HISTORY: Since 2022, he started having balance problems with increased falls.  He was falling on a weekly basis.  Sometimes he loses his balance when his feet start moving quickly and the rest of his body cannot catch up.  He had a fall in September in which he bent over to pick something off of the ground and lost balance striking his face on the concrete step and sustaining a laceration to the nose.  He was seen in the ED where CT head and face personally reviewed revealed generalized cerebral atrophy and remote lacunar infarct in the anterior limb of the right internal capsule but no fractures or acute intracranial abnormality.  He feels like he is moving slowly.  He has more difficulty writing.  His handwriting is smaller.  He denies difficulty using utensils or chewing and swallowing.  He feels that he cannot speak loudly.  Denies tremor.  Denies REM sleep behavior disorder.  Sense of smell is good.  Has some chronic arthritic low back pain but no radicular pain or numbness in the legs.  Labs in November revealed B12 485, TSH 3.380 and non-reactive RPR.  No family history of Parkinson's disease or other neurologic disorder.  PAST MEDICAL HISTORY: Past Medical History:  Diagnosis Date   Hypertension    Stroke [facility])     MEDICATIONS: Current Outpatient Medications on File  Prior to Visit  Medication Sig Dispense Refill   acetaminophen (TYLENOL) 500 MG tablet Take 500 mg by mouth every 6 (six) hours as needed.     carbidopa-levodopa (SINEMET CR) 50-200 MG tablet TAKE 1 TABLET BY MOUTH ONCE DAILY IN THE EVENING AT  8PM 30 tablet 0   carbidopa-levodopa (SINEMET IR) 25-100 MG tablet TAKE 1 & 1/2 (ONE & ONE-HALF) TABLETS BY MOUTH 5 TIMES DAILY (AT 4-5AM, 8AM, 11AM, 2PM, 5PM) 225 tablet 0   ibuprofen (ADVIL) 200 MG tablet Take 200 mg by mouth every 6 (six) hours as needed.     losartan-hydrochlorothiazide (HYZAAR) 50-12.5 MG tablet Take 1 tablet by mouth daily. 90 tablet 3   Multiple Vitamin (MULTIVITAMIN) tablet Take 1 tablet by mouth daily.     omeprazole (PRILOSEC) 20 MG capsule Take 1 capsule (20 mg total) by mouth daily. 90 capsule 3   No current facility-administered medications on file prior to visit.    ALLERGIES: No Known Allergies  FAMILY HISTORY: Family History  Problem Relation Age of Onset   Cancer Father       Objective:  *** General: No acute distress.  Patient appears well-groomed.   Head:  Normocephalic/atraumatic Eyes:  Fundi examined but not visualized Neck: supple, no paraspinal tenderness, full range of motion Heart:  Regular rate and rhythm Neurological Exam: Alert and oriented.  Speech fluent and not dysarthric.  Language intact.  Hypomimia.  CN II-XII intact.  Slight increased tone in elbows, mild increased tone in right wrist; bradykinesia, muscle strength 5/5 throughout.  No tremor observed.  Sensation to light touch intact.  Deep tendon reflexes 2+ throughout.  Finger to nose testing intact.  Slight flexed posture with reduced arm swing on left.  Mild shuffling.  Romberg with sway.  Negative retro-pulsion test.    Shon Millet, DO  CC: Sharlot Gowda, MD

## 2023-06-05 ENCOUNTER — Encounter: Payer: Self-pay | Admitting: Neurology

## 2023-06-05 ENCOUNTER — Ambulatory Visit: Payer: BC Managed Care – PPO | Admitting: Neurology

## 2023-06-05 DIAGNOSIS — G20A2 Parkinson's disease without dyskinesia, with fluctuations: Secondary | ICD-10-CM

## 2023-06-05 MED ORDER — CARBIDOPA-LEVODOPA 25-100 MG PO TABS: ORAL_TABLET | ORAL | 5 refills | Status: DC

## 2023-06-05 MED ORDER — CARBIDOPA-LEVODOPA ER 50-200 MG PO TBCR: EXTENDED_RELEASE_TABLET | ORAL | 5 refills | Status: DC

## 2023-06-05 NOTE — Patient Instructions (Signed)
Continue carbidopa-levodopa IR 1.5 tablets five times daily as directed and CR 1 tablet at 8 PM.  Use timer on phone to remind you  Routine exercise

## 2023-06-24 ENCOUNTER — Telehealth: Payer: Self-pay | Admitting: Neurology

## 2023-06-24 DIAGNOSIS — G20A2 Parkinson's disease without dyskinesia, with fluctuations: Secondary | ICD-10-CM

## 2023-06-24 DIAGNOSIS — M25512 Pain in left shoulder: Secondary | ICD-10-CM | POA: Insufficient documentation

## 2023-06-24 MED ORDER — CARBIDOPA-LEVODOPA ER 50-200 MG PO TBCR: EXTENDED_RELEASE_TABLET | ORAL | 5 refills | Status: DC

## 2023-06-24 MED ORDER — CARBIDOPA-LEVODOPA 25-100 MG PO TABS: ORAL_TABLET | ORAL | 5 refills | Status: DC

## 2023-06-24 NOTE — Telephone Encounter (Signed)
Pt called in and left a message. He wants to speak with someone about a medication change.

## 2023-06-24 NOTE — Telephone Encounter (Signed)
Refills sent.  He is already taking the medication every 3 hours.

## 2023-06-24 NOTE — Telephone Encounter (Signed)
Patient called about RX being sent to [facility] [address] before 6pm and if it can't be sent please call patient and let him know

## 2023-06-24 NOTE — Telephone Encounter (Signed)
Left message with the after hour service on 06-23-23 @7 :33 pm  Caller states that he is wanting to know if he can get the Carbidopa changed to 8 times a day vs 5 times a day. He states that it is running out of medication quicker than usual. He has a refill ready to be picked up. He is wanting insurance to kick in before he kicks in before he picks it up

## 2023-06-24 NOTE — Telephone Encounter (Signed)
Pt called back in stating he forgot to tell [facility] he is out of medicine. The last one that was called in didn't have any refills. He wants it sent to [facility] on [address].

## 2023-06-24 NOTE — Telephone Encounter (Signed)
Per Patient he want to increase to 1.5 tabs 8x a day. The Five times a day only last three hours each time he take his IR, and the CR he doesn't like to take.

## 2023-06-30 ENCOUNTER — Other Ambulatory Visit: Payer: Self-pay | Admitting: Orthopedic Surgery

## 2023-06-30 DIAGNOSIS — M25512 Pain in left shoulder: Secondary | ICD-10-CM

## 2023-07-24 ENCOUNTER — Ambulatory Visit
Admission: RE | Admit: 2023-07-24 | Discharge: 2023-07-24 | Disposition: A | Discharge status: Home / Self Care | Payer: BC Managed Care – PPO | Source: Ambulatory Visit | Attending: Orthopedic Surgery | Admitting: Orthopedic Surgery

## 2023-07-24 DIAGNOSIS — M25512 Pain in left shoulder: Secondary | ICD-10-CM

## 2023-08-26 IMAGING — CT CT HEAD W/O CM
4 series · 16 of 47 positions shown, 18 images · non-contrast
Comparison: None.

CLINICAL DATA: Facial trauma.  Fall face first onto concrete.

EXAM:
CT HEAD WITHOUT CONTRAST
TECHNIQUE: Contiguous axial images were obtained from the base of the skull
through the vertex without intravenous contrast.

[Series 2: head wo · axial · 0.48mm/px · z∈[-188,-68]mm · 7 of 34 slices shown, 9 images]
[im 5/34  brain]
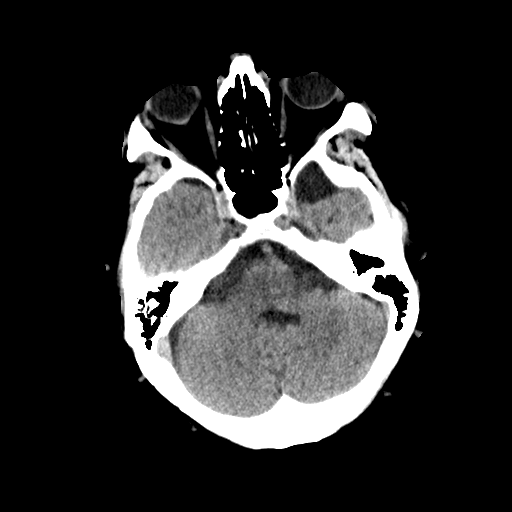
[im 5/34  bone]
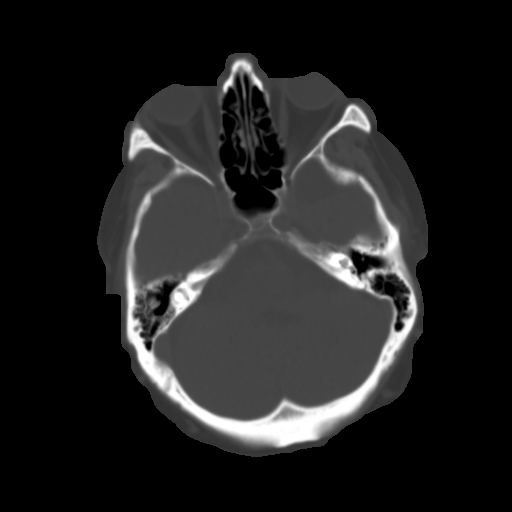
[im 9/34  brain]
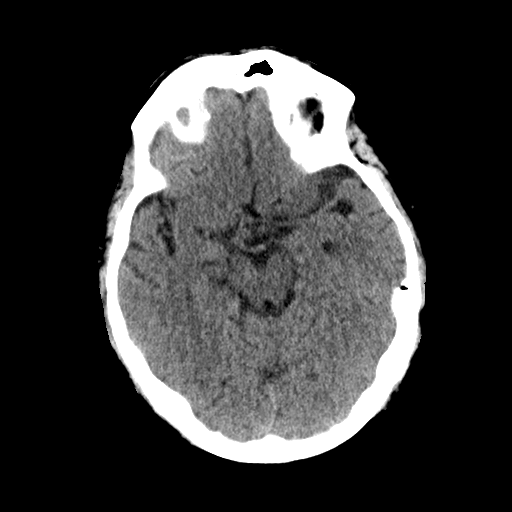
[im 13/34  brain]
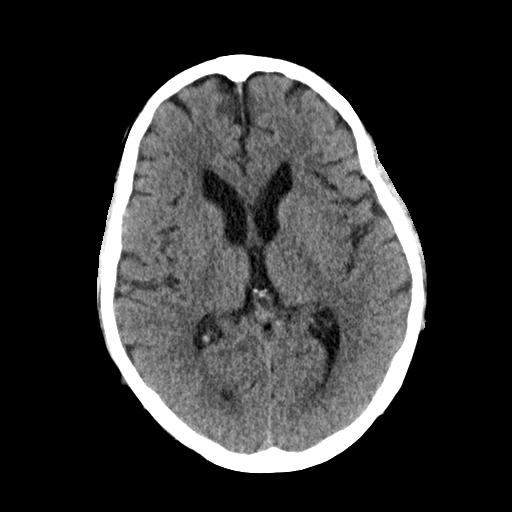
[im 17/34  brain]
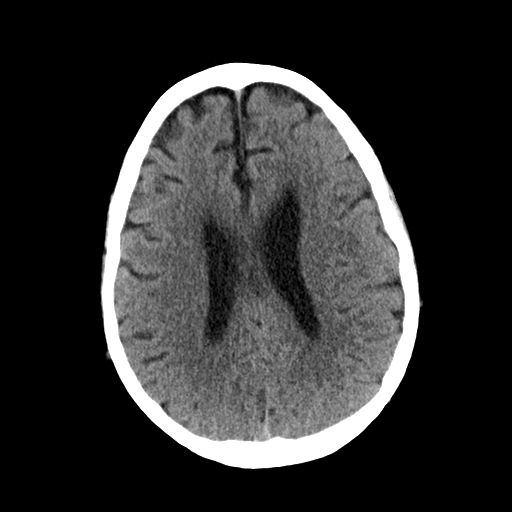
[im 21/34  brain]
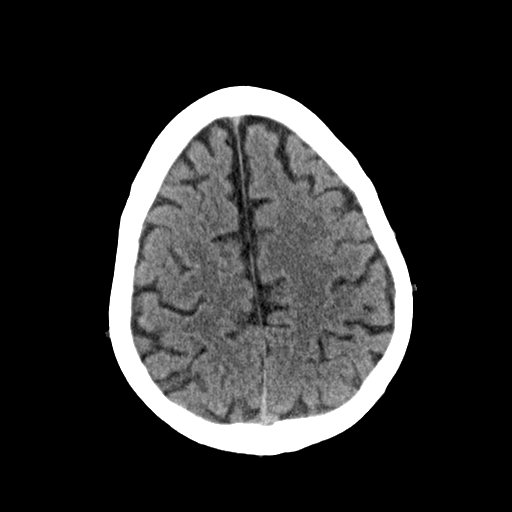
[im 21/34  bone]
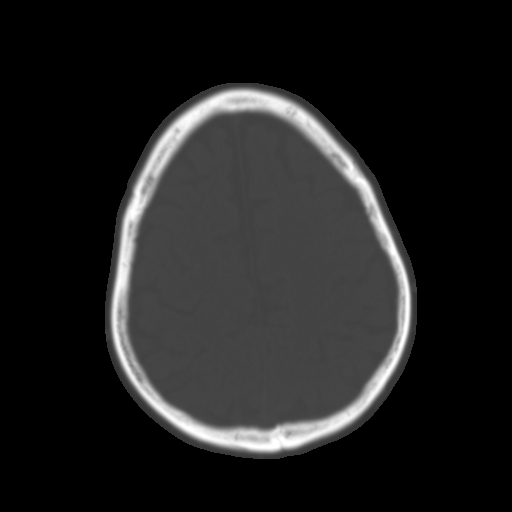
[im 25/34  brain]
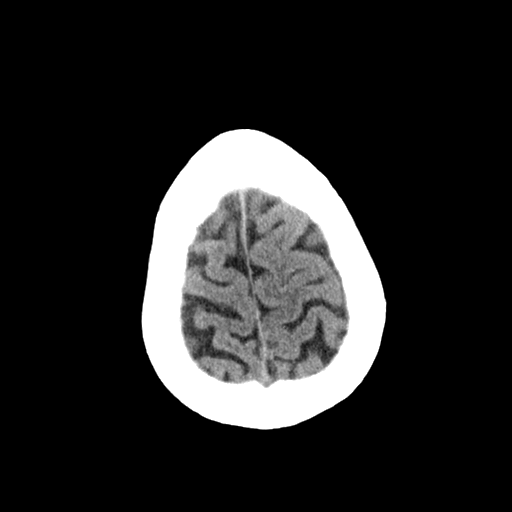
[im 29/34  brain]
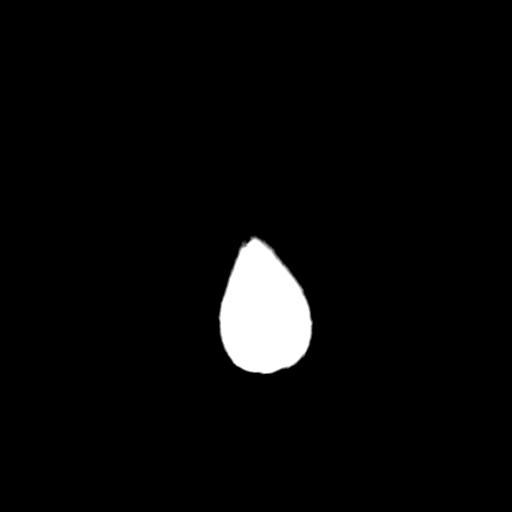

[Series 3: head bone · axial · 0.48mm/px · z∈[-192,-158]mm · 3 of 85 slices shown]
[im 9/85  bone]
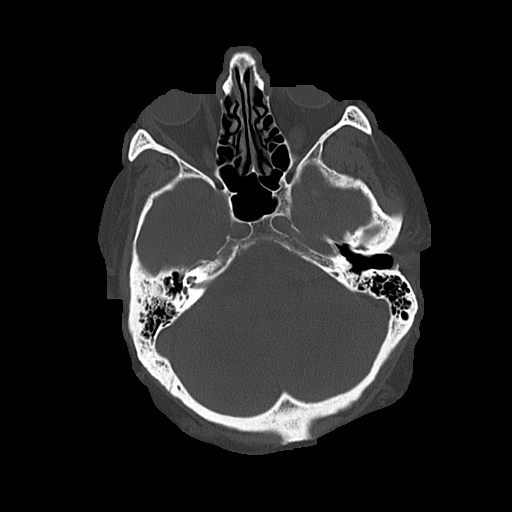
[im 17/85  bone]
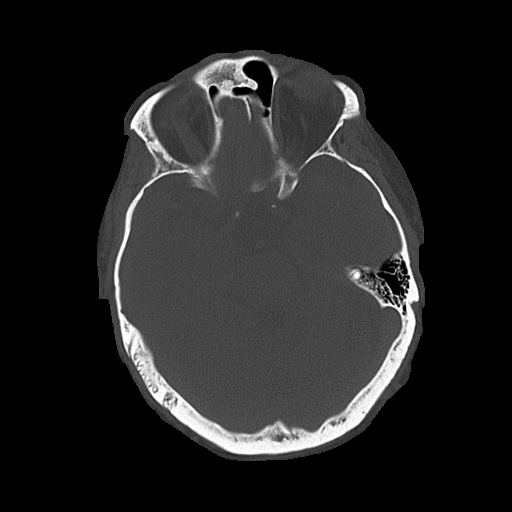
[im 26/85  bone]
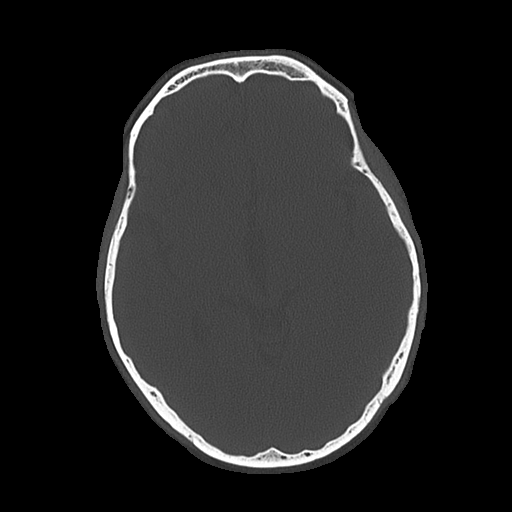

[Series 4: coronal soft · coronal · 0.32mm/px · 3 of 78 slices shown]
[im 26/78  brain]
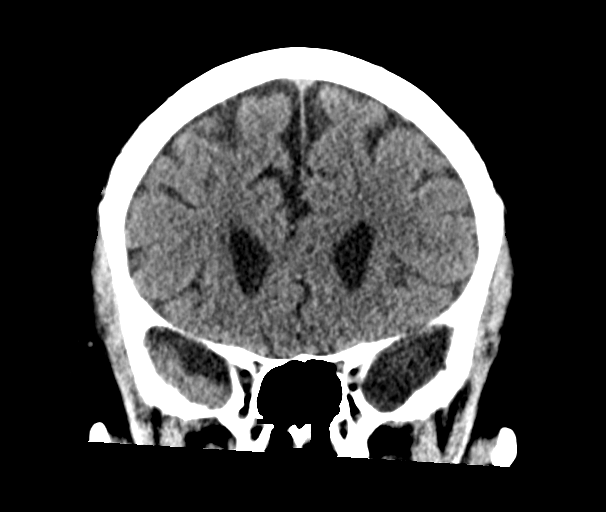
[im 35/78  brain]
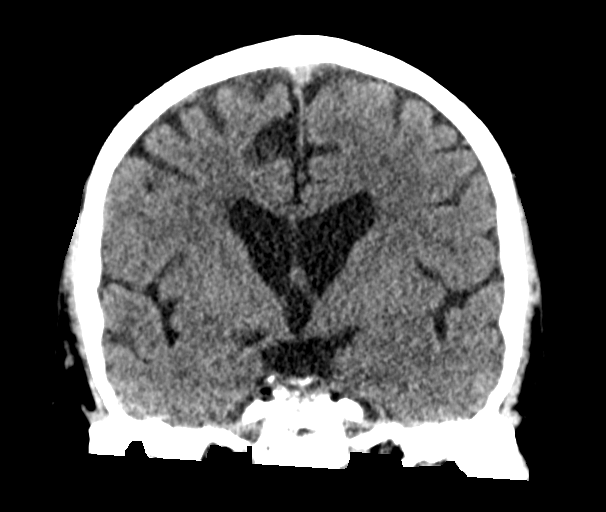
[im 43/78  brain]
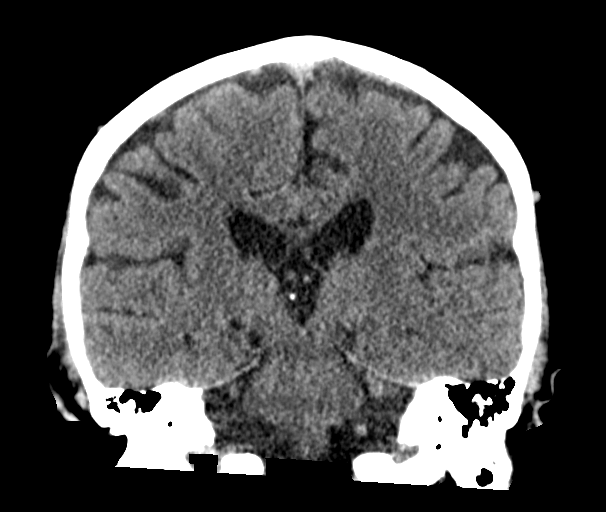

[Series 5: sagittal soft · sagittal · 0.32mm/px · 3 of 65 slices shown]
[im 22/65  brain]
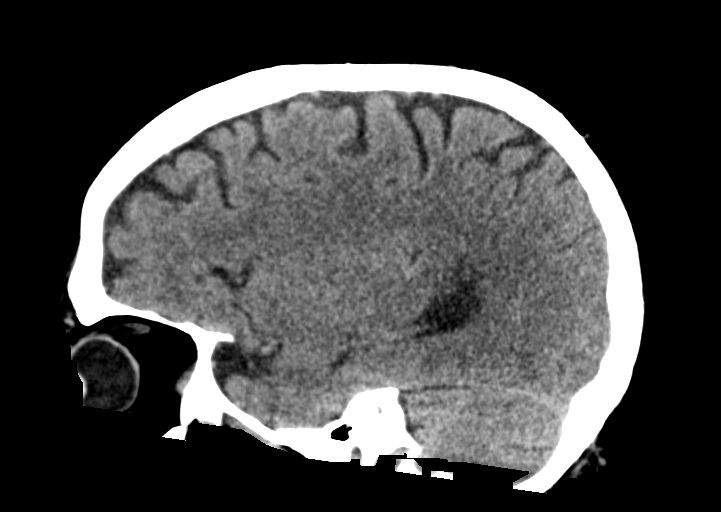
[im 33/65  brain]
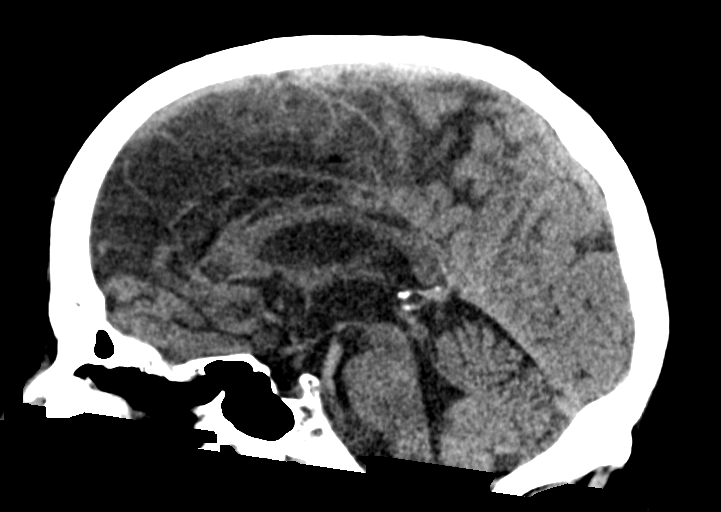
[im 43/65  brain]
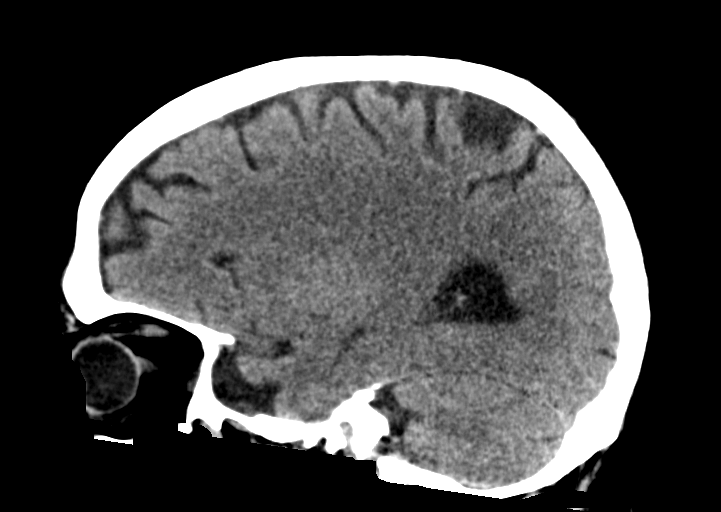

[16 of 47 positions shown; findings below may reference images not displayed]

FINDINGS: Brain: Generalized cerebral atrophy. No intracranial hemorrhage,
mass effect, or midline shift. No hydrocephalus. The basilar
cisterns are patent. Minimal periventricular chronic small vessel
ischemia. Remote lacunar infarct in the anterior limb of the right
internal capsule. No evidence of territorial infarct or acute
ischemia. No extra-axial or intracranial fluid collection.

Vascular: No hyperdense vessel.

Skull: No fracture or focal lesion.

Sinuses/Orbits: Assessed on concurrent face CT, reported separately.

Other: None.
IMPRESSION: 1. No acute intracranial abnormality. No skull fracture.
2. Generalized cerebral atrophy. Remote lacunar infarct in the
anterior limb of the right internal capsule.

## 2023-08-31 NOTE — Progress Notes (Unsigned)
No chief complaint on file.      PMH, PSH, SH reviewed   ROS:    PHYSICAL EXAM:  There were no vitals taken for this visit.      ASSESSMENT/PLAN:

## 2023-09-01 ENCOUNTER — Ambulatory Visit (INDEPENDENT_AMBULATORY_CARE_PROVIDER_SITE_OTHER): Payer: BC Managed Care – PPO | Admitting: Family Medicine

## 2023-09-01 ENCOUNTER — Encounter: Payer: Self-pay | Admitting: Family Medicine

## 2023-09-01 VITALS — BP 120/70 | HR 72 | Temp 97.4°F | Ht 74.0 in | Wt 294.2 lb

## 2023-09-01 DIAGNOSIS — L089 Local infection of the skin and subcutaneous tissue, unspecified: Secondary | ICD-10-CM

## 2023-09-01 DIAGNOSIS — L729 Follicular cyst of the skin and subcutaneous tissue, unspecified: Secondary | ICD-10-CM

## 2023-09-01 MED ORDER — DOXYCYCLINE HYCLATE 100 MG PO TABS
100.0000 mg | ORAL_TABLET | Freq: Two times a day (BID) | ORAL | 0 refills | Status: DC
Start: 2023-09-01 — End: 2024-02-10

## 2023-09-01 NOTE — Patient Instructions (Signed)
Apply warm compresses to the cyst on the next at least 3 times daily. Take the antibiotics twice daily as directed. If the cyst continues to increase in size and becomes more painful, and hasn't drained on its own, return to the office for re-evaluation. You may need to have it incised and drained (lanced).  Use sunscreen if out in the sun while on this antibiotic.

## 2023-10-23 ENCOUNTER — Other Ambulatory Visit: Payer: Self-pay | Admitting: Neurology

## 2023-10-28 ENCOUNTER — Ambulatory Visit

## 2023-11-11 ENCOUNTER — Encounter: Payer: Self-pay | Admitting: Internal Medicine

## 2023-11-14 ENCOUNTER — Telehealth: Payer: Self-pay | Admitting: Neurology

## 2023-11-14 DIAGNOSIS — G20A2 Parkinson's disease without dyskinesia, with fluctuations: Secondary | ICD-10-CM

## 2023-11-14 NOTE — Telephone Encounter (Signed)
 Spoke to the pharmacy, will send a refill in.

## 2023-11-14 NOTE — Telephone Encounter (Signed)
 Pt called in and left a message. He says [facility] was going to send a refill request. He is out of his medicine and he is tired of his legs twitching.

## 2023-12-05 NOTE — Progress Notes (Signed)
 NEUROLOGY FOLLOW UP OFFICE NOTE  John [name] [id number]  Assessment/Plan:   Parkinson's disease.  Due to GI upset, convert from IR to CR  Carbidopa-levodopa CR 50/200mg  1 tablet 6 times daily (4-5 AM, 8 AM, 11 AM, 2 PM, 5 PM and 8 PM) Home exercise to help with gait, movement, strength and fatigue Follow up 6 months.   Subjective:  John [name] is a 63 year old right-handed male with HTN who follows up for Parkinson disease.  UPDATE: Carbidopa-levodopa 25/100mg  to 1.5 tablets five times daily (4-5 AM, 8 AM, 11 AM, 2 PM, 5 PM); carbidopa-levodopa 50/200mg  CR at 8 PM  Overall he is doing well.  Feels fatigued but movement is better.  However, he has GI upset and needs to use the restroom after each dose of the IR.     HISTORY: Since 2022, he started having balance problems with increased falls.  He was falling on a weekly basis.  Sometimes he loses his balance when his feet start moving quickly and the rest of his body cannot catch up.  He had a fall in which he bent over to pick something off of the ground and lost balance striking his face on the concrete step and sustaining a laceration to the nose.  He was seen in the ED where CT head and face revealed generalized cerebral atrophy and remote lacunar infarct in the anterior limb of the right internal capsule but no fractures or acute intracranial abnormality.  He feels like he is moving slowly.  He has more difficulty writing.  His handwriting is smaller.  He denies difficulty using utensils or chewing and swallowing.  He feels that he cannot speak loudly.  Denies tremor.  Denies REM sleep behavior disorder.  Sense of smell is good.  Has some chronic arthritic low back pain but no radicular pain or numbness in the legs.  Labs in November revealed B12 485, TSH 3.380 and non-reactive RPR.  No family history of Parkinson's disease or other neurologic disorder.  PAST MEDICAL HISTORY: Past Medical History:  Diagnosis Date    Hypertension    Stroke [facility])     MEDICATIONS: Current Outpatient Medications on File Prior to Visit  Medication Sig Dispense Refill   acetaminophen (TYLENOL) 500 MG tablet Take 500 mg by mouth every 6 (six) hours as needed. (Patient not taking: Reported on 09/01/2023)     carbidopa-levodopa (SINEMET CR) 50-200 MG tablet TAKE 1 TABLET BY MOUTH IN THE EVENING AT  8  PM 30 tablet 0   carbidopa-levodopa (SINEMET IR) 25-100 MG tablet TAKE 1 & 1/2 (ONE & ONE-HALF) TABLETS BY MOUTH FIVE TIMES DAILY (4-5AM, 8AM, 11AM, 2PM, 5PM) 225 tablet 0   doxycycline (VIBRA-TABS) 100 MG tablet Take 1 tablet (100 mg total) by mouth 2 (two) times daily. 20 tablet 0   ibuprofen (ADVIL) 200 MG tablet Take 200 mg by mouth every 6 (six) hours as needed. (Patient not taking: Reported on 09/01/2023)     losartan-hydrochlorothiazide (HYZAAR) 50-12.5 MG tablet Take 1 tablet by mouth daily. 90 tablet 3   Multiple Vitamin (MULTIVITAMIN) tablet Take 1 tablet by mouth daily.     omeprazole (PRILOSEC) 20 MG capsule Take 1 capsule (20 mg total) by mouth daily. (Patient not taking: Reported on 09/01/2023) 90 capsule 3   No current facility-administered medications on file prior to visit.    ALLERGIES: No Known Allergies  FAMILY HISTORY: Family History  Problem Relation Age of Onset   Cancer Father  Objective:  Blood pressure (!) 143/85, pulse 74, resp. rate 18, height 6\' 2"  (1.88 m), weight 295 lb (133.8 kg), SpO2 98%. General: No acute distress.  Patient appears well-groomed.   Head:  Normocephalic/atraumatic Eyes:  Fundi examined but not visualized Neck: supple, no paraspinal tenderness, full range of motion Heart:  Regular rate and rhythm Neurological Exam: Alert and oriented.  Speech fluent and not dysarthric.  Language intact.  Hypomimia.  CN II-XII intact.  Slight increased tone in elbows, mild increased tone in wrist.  Bradykinesia.  Muscle strength 5/5 throughout.  No tremor observed.  Sensation to light  touch intact.  Deep tendon reflexes 2+ throughout.  Finger to nose testing intact.  Slight flexed posture with reduced flexed arm swing on left, mild reduced arm swing on right.  Mild shuffling.  En-bloc turn.  Romberg with sway.  Negative retro-pulsion test.     Shon Millet, DO  CC: Sharlot Gowda, MD

## 2023-12-08 ENCOUNTER — Encounter: Payer: Self-pay | Admitting: Neurology

## 2023-12-08 ENCOUNTER — Ambulatory Visit (INDEPENDENT_AMBULATORY_CARE_PROVIDER_SITE_OTHER): Payer: BC Managed Care – PPO | Admitting: Neurology

## 2023-12-08 VITALS — BP 143/85 | HR 74 | Resp 18 | Ht 74.0 in | Wt 295.0 lb

## 2023-12-08 DIAGNOSIS — G20A2 Parkinson's disease without dyskinesia, with fluctuations: Secondary | ICD-10-CM

## 2023-12-08 MED ORDER — CARBIDOPA-LEVODOPA ER 50-200 MG PO TBCR: 1.0000 | EXTENDED_RELEASE_TABLET | Freq: Every day | ORAL | 5 refills | Status: AC

## 2023-12-08 NOTE — Patient Instructions (Signed)
 Will contact you regarding conversion to extended release

## 2023-12-09 NOTE — Progress Notes (Unsigned)
 Complete physical exam  Patient: John [name]   DOB: [birth year]   63 y.o. Male  MRN: [id number]  Subjective:    No chief complaint on file.   John [name] is a 63 y.o. male who presents today for a complete physical exam. He reports consuming a {diet types:17450} diet. {types:19826} He generally feels {DESC; WELL/FAIRLY WELL/POORLY:18703}. He reports sleeping {DESC; WELL/FAIRLY WELL/POORLY:18703}. He {does/does not:200015} have additional problems to discuss today.    Most recent fall risk assessment:    12/08/2023    9:15 AM  Fall Risk   Falls in the past year? 0  Number falls in past yr: 0  Injury with Fall? 0  Follow up Falls evaluation completed     Most recent depression screenings:    12/03/2022   11:13 AM 10/29/2021    1:58 PM  PHQ 2/9 Scores  PHQ - 2 Score 0 0    {VISON DENTAL STD PSA (Optional):27386}  {History (Optional):23778}  Patient Care Team: Ronnald Nian, MD as PCP - General (Family Medicine) Drema Dallas, DO as Consulting Physician (Neurology)   Outpatient Medications Prior to Visit  Medication Sig   acetaminophen (TYLENOL) 500 MG tablet Take 500 mg by mouth every 6 (six) hours as needed.   carbidopa-levodopa (SINEMET CR) 50-200 MG tablet Take 1 tablet by mouth 6 (six) times daily. (4-5 AM, 8 AM, 11 AM, 2 PM, 5 PM, 8 PM).   doxycycline (VIBRA-TABS) 100 MG tablet Take 1 tablet (100 mg total) by mouth 2 (two) times daily.   ibuprofen (ADVIL) 200 MG tablet Take 200 mg by mouth every 6 (six) hours as needed.   indomethacin (INDOCIN) 25 MG capsule Take 25 mg by mouth 2 (two) times daily with a meal.   losartan-hydrochlorothiazide (HYZAAR) 50-12.5 MG tablet Take 1 tablet by mouth daily.   Multiple Vitamin (MULTIVITAMIN) tablet Take 1 tablet by mouth daily.   omeprazole (PRILOSEC) 20 MG capsule Take 1 capsule (20 mg total) by mouth daily.   No facility-administered medications prior to visit.    ROS        Objective:     There were  no vitals taken for this visit. {Vitals History (Optional):23777}  Physical Exam   No results found for any visits on 12/10/23. {Show previous labs (optional):23779}    Assessment & Plan:    Routine Health Maintenance and Physical Exam  Immunization History  Administered Date(s) Administered   Janssen (J&J) SARS-COV-2 Vaccination 03/09/2020, 08/14/2020   Pfizer Covid-19 Vaccine Bivalent Booster 61yrs & up 07/27/2021   Tdap 07/31/2020   Zoster Recombinant(Shingrix) 07/31/2020, 10/09/2020    Health Maintenance  Topic Date Due   HIV Screening  Never done   COVID-19 Vaccine (4 - 2024-25 season) 05/18/2023   INFLUENZA VACCINE  12/15/2023 (Originally 04/17/2023)   Fecal DNA (Cologuard)  04/13/2026   DTaP/Tdap/Td (2 - Td or Tdap) 07/31/2030   Hepatitis C Screening  Completed   Zoster Vaccines- Shingrix  Completed   HPV VACCINES  Aged Out    Discussed health benefits of physical activity, and encouraged him to engage in regular exercise appropriate for his age and condition.  Problem List Items Addressed This Visit       Cardiovascular and Mediastinum   Essential hypertension     Digestive   Gastroesophageal reflux disease without esophagitis     Nervous and Auditory   Parkinson's disease with fluctuating manifestations (HCC)     Other   Family history of prostate cancer  Obesity (BMI 30.0-34.9)   Routine general medical examination at a health care facility - Primary   Other Visit Diagnoses       Screening for lipid disorders         Need for vaccination against Streptococcus pneumoniae          No follow-ups on file.     Fredirick Maudlin, RMA

## 2023-12-10 ENCOUNTER — Encounter: Payer: Self-pay | Admitting: Family Medicine

## 2023-12-10 ENCOUNTER — Ambulatory Visit (INDEPENDENT_AMBULATORY_CARE_PROVIDER_SITE_OTHER): Payer: BC Managed Care – PPO | Admitting: Family Medicine

## 2023-12-10 VITALS — BP 138/82 | HR 87 | Ht 74.0 in | Wt 289.1 lb

## 2023-12-10 DIAGNOSIS — Z8042 Family history of malignant neoplasm of prostate: Secondary | ICD-10-CM

## 2023-12-10 DIAGNOSIS — E66811 Obesity, class 1: Secondary | ICD-10-CM | POA: Diagnosis not present

## 2023-12-10 DIAGNOSIS — I10 Essential (primary) hypertension: Secondary | ICD-10-CM

## 2023-12-10 DIAGNOSIS — G20A2 Parkinson's disease without dyskinesia, with fluctuations: Secondary | ICD-10-CM

## 2023-12-10 DIAGNOSIS — Z13220 Encounter for screening for lipoid disorders: Secondary | ICD-10-CM

## 2023-12-10 DIAGNOSIS — Z23 Encounter for immunization: Secondary | ICD-10-CM

## 2023-12-10 DIAGNOSIS — K219 Gastro-esophageal reflux disease without esophagitis: Secondary | ICD-10-CM | POA: Diagnosis not present

## 2023-12-10 DIAGNOSIS — Z0000 Encounter for general adult medical examination without abnormal findings: Secondary | ICD-10-CM | POA: Diagnosis not present

## 2023-12-10 DIAGNOSIS — H9313 Tinnitus, bilateral: Secondary | ICD-10-CM

## 2023-12-10 HISTORY — DX: Tinnitus, bilateral: H93.13

## 2023-12-10 MED ORDER — LOSARTAN POTASSIUM-HCTZ 50-12.5 MG PO TABS
1.0000 | ORAL_TABLET | Freq: Every day | ORAL | 3 refills | Status: DC
Start: 2023-12-10 — End: 2024-03-16

## 2023-12-10 MED ORDER — OMEPRAZOLE 20 MG PO CPDR: 20.0000 mg | DELAYED_RELEASE_CAPSULE | Freq: Every day | ORAL | 3 refills | Status: DC

## 2023-12-11 ENCOUNTER — Encounter: Payer: Self-pay | Admitting: Family Medicine

## 2023-12-11 LAB — CBC WITH DIFFERENTIAL/PLATELET
Basophils Absolute: 0.1 10*3/uL (ref 0.0–0.2)
Basos: 1 %
EOS (ABSOLUTE): 0.1 10*3/uL (ref 0.0–0.4)
Eos: 1 %
Hematocrit: 36.4 % — ABNORMAL LOW (ref 37.5–51.0)
Hemoglobin: 11.8 g/dL — ABNORMAL LOW (ref 13.0–17.7)
Immature Grans (Abs): 0 10*3/uL (ref 0.0–0.1)
Immature Granulocytes: 0 %
Lymphocytes Absolute: 1.7 10*3/uL (ref 0.7–3.1)
Lymphs: 22 %
MCH: 27.4 pg (ref 26.6–33.0)
MCHC: 32.4 g/dL (ref 31.5–35.7)
MCV: 85 fL (ref 79–97)
Monocytes Absolute: 0.6 10*3/uL (ref 0.1–0.9)
Monocytes: 8 %
Neutrophils Absolute: 5 10*3/uL (ref 1.4–7.0)
Neutrophils: 68 %
Platelets: 303 10*3/uL (ref 150–450)
RBC: 4.3 x10E6/uL (ref 4.14–5.80)
RDW: 13.5 % (ref 11.6–15.4)
WBC: 7.4 10*3/uL (ref 3.4–10.8)

## 2023-12-11 LAB — LIPID PANEL
Chol/HDL Ratio: 2.7 ratio (ref 0.0–5.0)
Cholesterol, Total: 158 mg/dL (ref 100–199)
HDL: 58 mg/dL (ref >39)
LDL Chol Calc (NIH): 73 mg/dL (ref 0–99)
Triglycerides: 156 mg/dL — ABNORMAL HIGH (ref 0–149)
VLDL Cholesterol Cal: 27 mg/dL (ref 5–40)

## 2023-12-11 LAB — COMPREHENSIVE METABOLIC PANEL WITH GFR
ALT: 5 IU/L (ref 0–44)
AST: 11 IU/L (ref 0–40)
Albumin: 4.2 g/dL (ref 3.9–4.9)
Alkaline Phosphatase: 101 IU/L (ref 44–121)
BUN/Creatinine Ratio: 18 (ref 10–24)
BUN: 20 mg/dL (ref 8–27)
Bilirubin Total: 0.7 mg/dL (ref 0.0–1.2)
CO2: 19 mmol/L — ABNORMAL LOW (ref 20–29)
Calcium: 9.2 mg/dL (ref 8.6–10.2)
Chloride: 103 mmol/L (ref 96–106)
Creatinine, Ser: 1.09 mg/dL (ref 0.76–1.27)
Globulin, Total: 2.2 g/dL (ref 1.5–4.5)
Glucose: 91 mg/dL (ref 70–99)
Potassium: 4.3 mmol/L (ref 3.5–5.2)
Sodium: 137 mmol/L (ref 134–144)
Total Protein: 6.4 g/dL (ref 6.0–8.5)
eGFR: 76 mL/min/{1.73_m2} (ref >59)

## 2023-12-11 LAB — PSA: Prostate Specific Ag, Serum: 0.8 ng/mL (ref 0.0–4.0)

## 2023-12-15 ENCOUNTER — Telehealth: Payer: Self-pay | Admitting: Neurology

## 2023-12-15 NOTE — Telephone Encounter (Signed)
 Pt called in stating he forgot to ask why he feels it in his left thigh once the medicine wears off?

## 2023-12-15 NOTE — Telephone Encounter (Signed)
 PER patient when the medication wear off he has a shiver or slight  tremor

## 2023-12-16 NOTE — Telephone Encounter (Signed)
 Per Dr.Jaffe, We just changed dosing last week to extended release so I would give it chance.  I wouldn't be aggressive in making drastic changes because of mild symptoms.

## 2023-12-18 NOTE — Telephone Encounter (Signed)
 Tried calling patient for the third time no answer. LMOVM to call the office back.

## 2023-12-30 DIAGNOSIS — M542 Cervicalgia: Secondary | ICD-10-CM | POA: Insufficient documentation

## 2024-01-09 IMAGING — CR DG CHEST 2V
2 series · 2 of 2 positions shown · non-contrast
Comparison: None

CLINICAL DATA: Chest pain, fell in shower yesterday, posterior LEFT
lower rib pain

EXAM:
CHEST - 2 VIEW

[w chest pa]
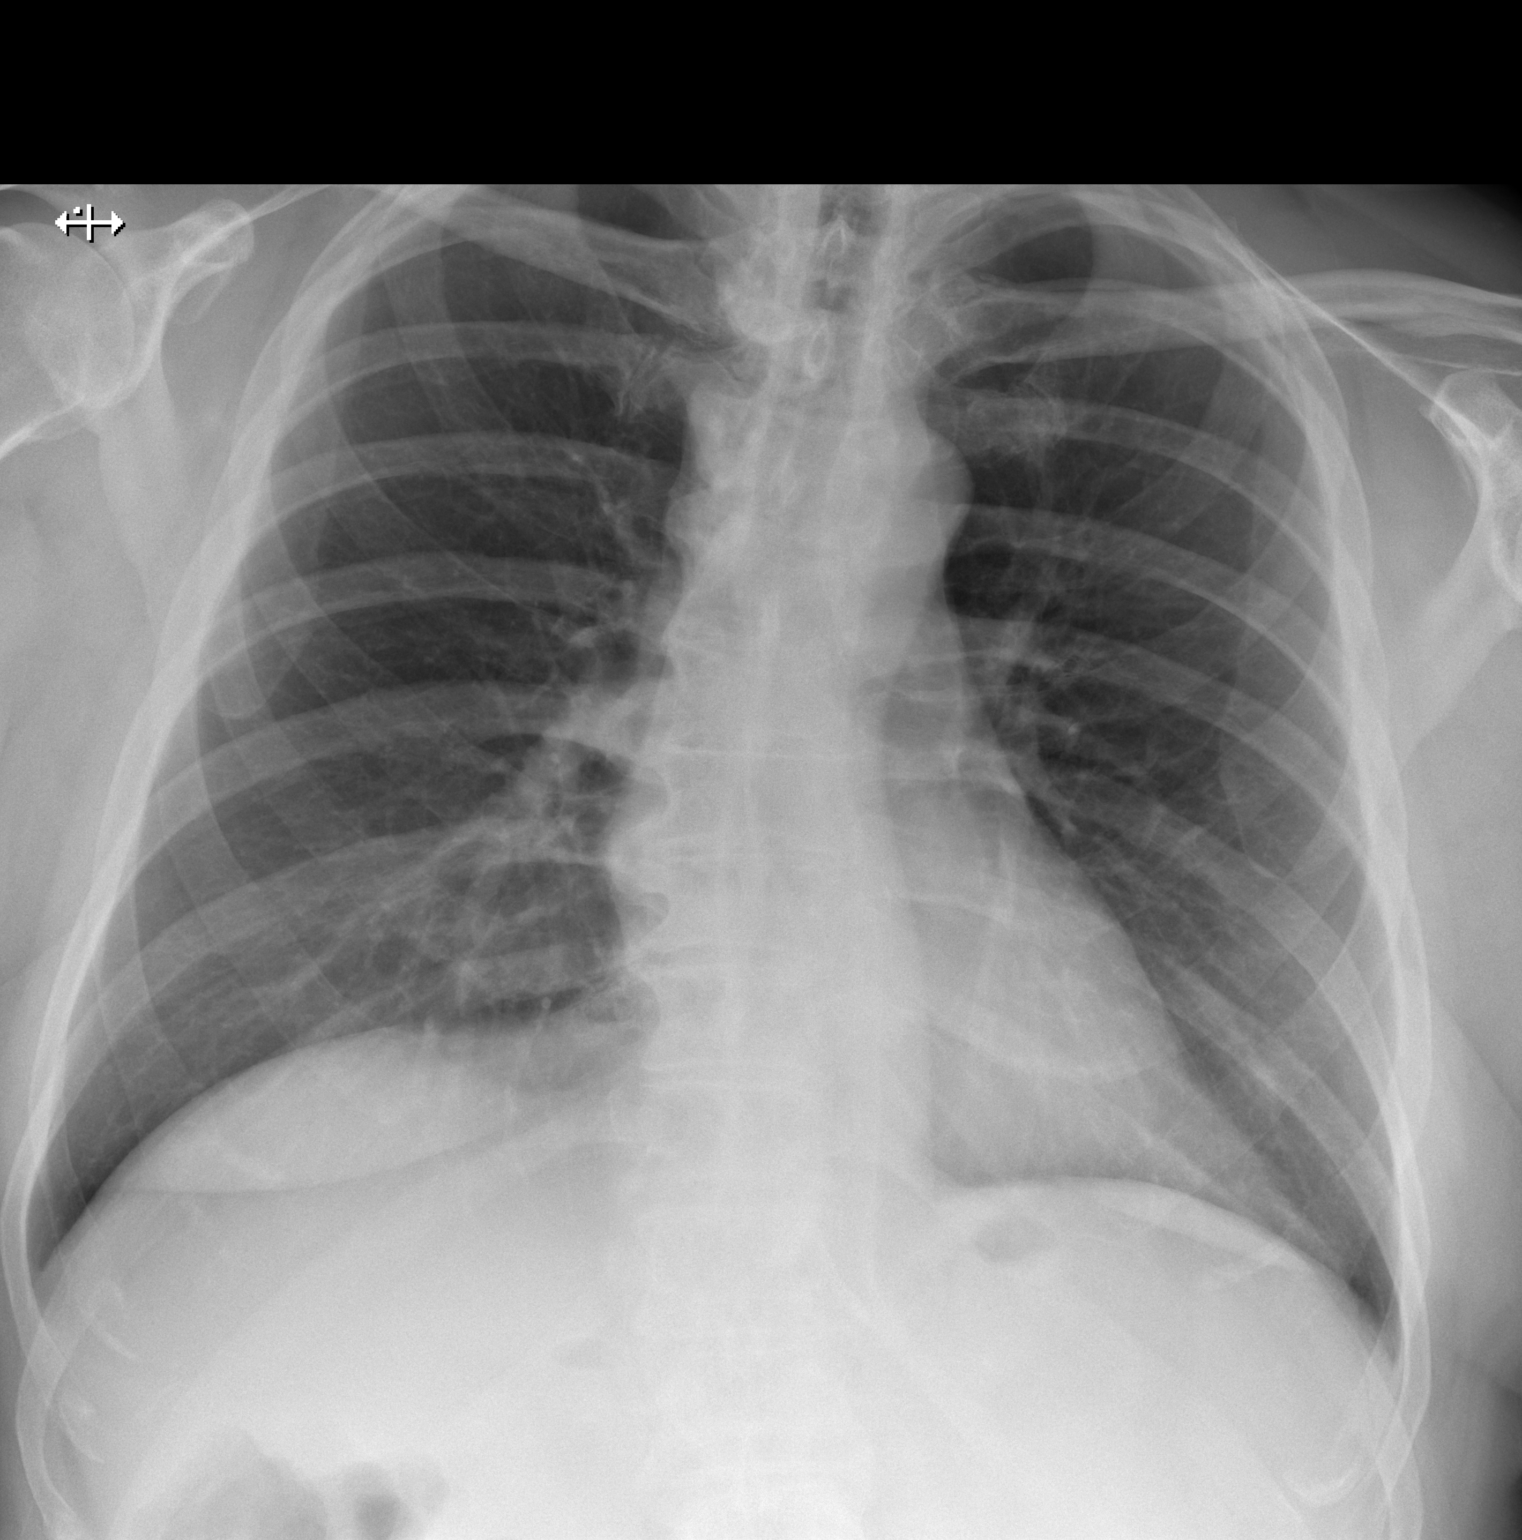

[w chest lat]
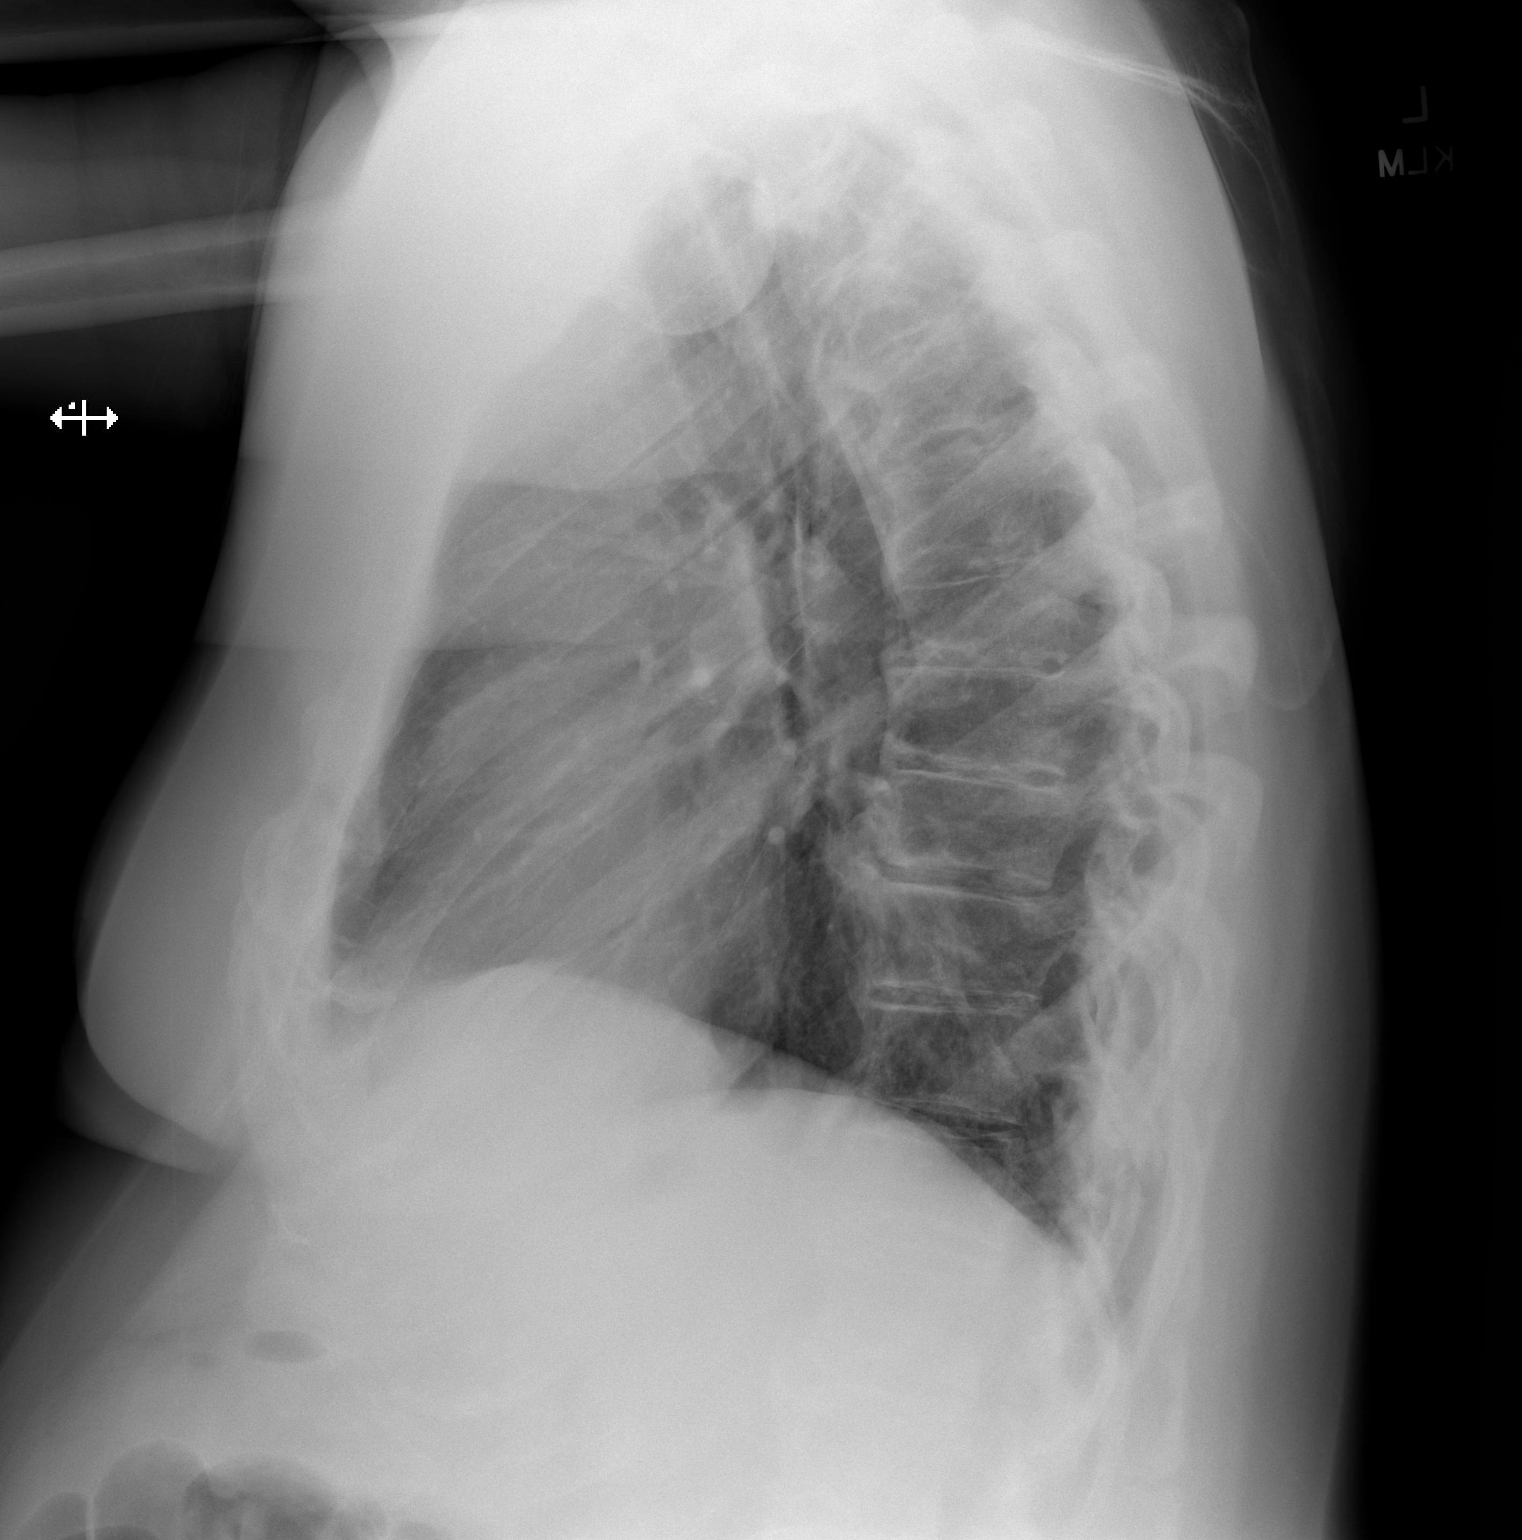

[2 of 2 positions shown; findings below may reference images not displayed]

FINDINGS: Normal heart size, mediastinal contours, and pulmonary vascularity.

Lungs clear.

No pulmonary infiltrate, pleural effusion, or pneumothorax.

Dextroconvex thoracic scoliosis with scattered degenerative disc
disease changes thoracic spine.

No acute osseous findings.
IMPRESSION: No acute abnormalities.

## 2024-01-20 ENCOUNTER — Telehealth: Payer: Self-pay | Admitting: Neurology

## 2024-01-20 DIAGNOSIS — G20A2 Parkinson's disease without dyskinesia, with fluctuations: Secondary | ICD-10-CM

## 2024-01-20 NOTE — Telephone Encounter (Signed)
 Pt called back in, he stated he is not doing any at home exercises. He has fallen down 7 times in 2 weeks.

## 2024-01-20 NOTE — Telephone Encounter (Signed)
 Patient called and LM with AN. He wants to be seen sooner with Festus Hubert due to him having issues with his balance

## 2024-01-20 NOTE — Telephone Encounter (Signed)
 Per Dr.Jaffe is the patient doing the home exercises

## 2024-01-21 NOTE — Telephone Encounter (Signed)
 Per Dr.jaffe, We have steadily been increasing the dose of the medication every time and he is taking the extended release form several times a day, so I don't think increasing the medication is warranted.  If he hasn't been exercising, he will get worse.  We can order home physical therapy.  If the frequent falls is an acute issue, he may want to check for a UTI.

## 2024-01-26 ENCOUNTER — Telehealth: Payer: Self-pay | Admitting: Neurology

## 2024-01-26 NOTE — Telephone Encounter (Signed)
 Patient is going to contact pharmacy, also is going to update insurance so referral can be sent

## 2024-01-26 NOTE — Telephone Encounter (Signed)
 Pt called in returning Sheena's call from Friday

## 2024-01-28 NOTE — Telephone Encounter (Signed)
 Pt came in and brought updated insurance card.

## 2024-01-29 NOTE — Telephone Encounter (Signed)
 John [name], John [name]  [name], CMA No ma'am. The only medicaids we take are healthy blue and traditional.   Referral form filled put for [facility]. Faxed to [phone].

## 2024-01-29 NOTE — Telephone Encounter (Signed)
 Per home health PT [facility], John [name], John [name]  [name], CMA Good morning, Carron Clap!  Insurance was just able to get the insurance verification back for this patient. His BCBS is showing to be termed. It shows his primary insurance to be the [employer]  which we are unfortunate out of network with.  Idk who else takes this insurance. I would try [facility] in [place]. I typically just google them. If that doesn't work, the patient may have to go to outpatient therapy and may have a copay.  Thank you ma'am   Will send back a message to see if they will accept his [facility] medicaid.

## 2024-02-10 ENCOUNTER — Telehealth: Payer: Self-pay | Admitting: Neurology

## 2024-02-10 ENCOUNTER — Ambulatory Visit: Admitting: Medical

## 2024-02-10 VITALS — BP 118/72 | HR 74 | Temp 97.0°F | Ht 74.0 in | Wt 280.0 lb

## 2024-02-10 DIAGNOSIS — R351 Nocturia: Secondary | ICD-10-CM | POA: Diagnosis not present

## 2024-02-10 DIAGNOSIS — R3915 Urgency of urination: Secondary | ICD-10-CM | POA: Diagnosis not present

## 2024-02-10 DIAGNOSIS — G20B2 Parkinson's disease with dyskinesia, with fluctuations: Secondary | ICD-10-CM | POA: Diagnosis not present

## 2024-02-10 DIAGNOSIS — M1990 Unspecified osteoarthritis, unspecified site: Secondary | ICD-10-CM

## 2024-02-10 DIAGNOSIS — R296 Repeated falls: Secondary | ICD-10-CM | POA: Diagnosis not present

## 2024-02-10 DIAGNOSIS — R270 Ataxia, unspecified: Secondary | ICD-10-CM

## 2024-02-10 DIAGNOSIS — R2681 Unsteadiness on feet: Secondary | ICD-10-CM

## 2024-02-10 LAB — POCT URINALYSIS DIP (PROADVANTAGE DEVICE)
Bilirubin, UA: NEGATIVE
Blood, UA: NEGATIVE
Glucose, UA: NEGATIVE mg/dL
Ketones, POC UA: NEGATIVE mg/dL
Leukocytes, UA: NEGATIVE
Nitrite, UA: NEGATIVE
Protein Ur, POC: NEGATIVE mg/dL
Specific Gravity, Urine: 1.01
Urobilinogen, Ur: NEGATIVE
pH, UA: 7 (ref 5.0–8.0)

## 2024-02-10 MED ORDER — TAMSULOSIN HCL 0.4 MG PO CAPS: 0.4000 mg | ORAL_CAPSULE | Freq: Every day | ORAL | 1 refills | Status: DC

## 2024-02-10 NOTE — Telephone Encounter (Signed)
 1. Which medications need refilled? (List name and dosage, if known) carbidopa -levodopa  - he takes it 8 times a day, not 6 like the bottle says  2. Which pharmacy/location is medication to be sent to? (include street and city if local pharmacy) [facility]

## 2024-02-10 NOTE — Progress Notes (Signed)
 Subjective:  John [name] is a 63 y.o. male who presents for Chief Complaint  Patient presents with   Acute Visit    Urinary issues- urologist would like him to be checked for UTI- has an urge but then can't get. Been going on 2-3 weeks.  Couldn't get in with urologist when he called today     Here for urinary symptoms x 2-3 weeks.  Has tremendous urge to urinate in the middle of the night.  No burning, no pain, no incontinence, no blood in urine, no cloudy or odorous urine.  Symptom mainly at night.  Maybe getting up 8 times at night to urinate.  No polydipsia, no blurred vision  In past 2 months having some falls, feels unsteady on feet at times.  No headaches.  No confusion.    Sees neurology, wonders if he should have updated head scan, MRI.  He complains about arthritis in several joints.  Wants help with pain relief.  Using too many tylenol  already.   No other aggravating or relieving factors.    No other c/o.  Past Medical History:  Diagnosis Date   Hypertension    Stroke [facility])    Current Outpatient Medications on File Prior to Visit  Medication Sig Dispense Refill   acetaminophen  (TYLENOL ) 500 MG tablet Take 500 mg by mouth every 6 (six) hours as needed.     carbidopa -levodopa  (SINEMET  CR) 50-200 MG tablet Take 1 tablet by mouth 6 (six) times daily. (4-5 AM, 8 AM, 11 AM, 2 PM, 5 PM, 8 PM). 180 tablet 5   ibuprofen (ADVIL) 200 MG tablet Take 200 mg by mouth every 6 (six) hours as needed.     losartan -hydrochlorothiazide (HYZAAR) 50-12.5 MG tablet Take 1 tablet by mouth daily. 90 tablet 3   omeprazole  (PRILOSEC ) 20 MG capsule Take 1 capsule (20 mg total) by mouth daily. 90 capsule 3   indomethacin (INDOCIN) 25 MG capsule Take 25 mg by mouth 2 (two) times daily with a meal. (Patient not taking: Reported on 02/10/2024)     No current facility-administered medications on file prior to visit.     The following portions of the patient's history were reviewed and updated as  appropriate: allergies, current medications, past family history, past medical history, past social history, past surgical history and problem list.  ROS Otherwise as in subjective above    Objective: BP 118/72   Pulse 74   Temp (!) 97 F (36.1 C)   Ht 6\' 2"  (1.88 m)   Wt 280 lb (127 kg)   SpO2 98%   BMI 35.95 kg/m   General appearance: alert, no distress, well developed, well nourished HEENT: normocephalic, sclerae anicteric, conjunctiva pink and moist Oral cavity: MMM Abdomen: +bs, soft, non tender, non distended, no masses, no hepatomegaly, no splenomegaly Pulses: 2+ radial pulses, 2+ pedal pulses, normal cap refill Ext: no edema DRE - declines Neuro: unsteady on feet, didn't attempt heel to toe, somewhat slow ROM with arms, gait cautious    Assessment: Encounter Diagnoses  Name Primary?   Urgency of urination Yes   Nocturia    Parkinson's disease with dyskinesia and fluctuating manifestations (HCC)    Falls    Ataxia    Arthritis    Unsteady gait      Plan: Urinary urgency, nocturia - discussed differential.  Possible BPH related.  Begin trial of flomax, needs to do better with water intake and less soda/salt.    Urine culture sent.  Follow up  pending culture  He notes worse falls and unsteadiness in last 1-2 months.  Advised follow up with neurology, consider updated imaging if falls/unsteadiness is worsening/new.  Arthritis - advised water aerobics at the [facility].    Given unsteadiness of gait, arthritis, falls, discussed referral to physical therapy.  He declines today but will consider   John [name] was seen today for acute visit.  Diagnoses and all orders for this visit:  Urgency of urination -     POCT Urinalysis DIP (Proadvantage Device) -     Urine Culture  Nocturia  Parkinson's disease with dyskinesia and fluctuating manifestations (HCC)  Falls  Ataxia  Arthritis  Unsteady gait  Other orders -     tamsulosin (FLOMAX) 0.4 MG CAPS  capsule; Take 1 capsule (0.4 mg total) by mouth daily.    Follow up: pending urine culture

## 2024-02-10 NOTE — Telephone Encounter (Signed)
 Patient the phone, I tried to advise patient of note from [facility] on 01/21/24, Per Dr.jaffe, We have steadily been increasing the dose of the medication every time and he is taking the extended release form several times a day, so I don't think increasing the medication is warranted.  If he hasn't been exercising, he will get worse.  We can order home physical therapy.  If the frequent falls is an acute issue, he may want to check for a UTI.      And from last visit,  Carbidopa -levodopa  CR 50/200mg  1 tablet 6 times daily (4-5 AM, 8 AM, 11 AM, 2 PM, 5 PM and 8 PM)   Per Dr.Jaffe no increase was made. Patient is to take Carbidopa -Levodopa  6 times a day as prescribed.    Phone was disconnected.

## 2024-02-10 NOTE — Telephone Encounter (Signed)
 Patient called back, He do not like the way he feels. So he increased his medication to times a day with 11 pm being the last time he takes his medication.  Advised patient of Dr.Jaffe note from 01/21/24.    Per patient he sent his urine in to be checked for UTI. Waiting on results.

## 2024-02-11 LAB — URINE CULTURE

## 2024-02-12 ENCOUNTER — Ambulatory Visit: Payer: Self-pay | Admitting: Medical

## 2024-02-12 NOTE — Progress Notes (Signed)
 The urine culture shows no infection.  See if he is having any improvement with the Flomax so far.  Make sure he has scheduled follow-up with neurology regarding falls and once again offer physical therapy for falls and balance if agreeable

## 2024-02-16 ENCOUNTER — Other Ambulatory Visit: Payer: Self-pay | Admitting: Medical

## 2024-02-16 ENCOUNTER — Ambulatory Visit
Admission: RE | Admit: 2024-02-16 | Discharge: 2024-02-16 | Disposition: A | Discharge status: Home / Self Care | Source: Ambulatory Visit | Attending: Medical | Admitting: Medical

## 2024-02-16 DIAGNOSIS — M25562 Pain in left knee: Secondary | ICD-10-CM

## 2024-02-18 ENCOUNTER — Telehealth: Payer: Self-pay | Admitting: Neurology

## 2024-02-18 NOTE — Telephone Encounter (Signed)
LMOVM for patient to call the office.

## 2024-02-18 NOTE — Telephone Encounter (Signed)
 Patient wants to speak to someone about his Carbidopa  levodopa 

## 2024-02-23 NOTE — Telephone Encounter (Signed)
 Per patient his Urine culture received, Per PCP, The urine culture shows no infection.  See if he is having any improvement with the Flomax  so far.   Make sure he has scheduled follow-up with neurology regarding falls and once again offer physical therapy for falls and balance if agreeable.   Per patient no response from [facility].  Phone number for [facility] given to patient.  Advised to call the office back if he has any issues.

## 2024-02-23 NOTE — Telephone Encounter (Signed)
 Pt called no answer left a voice mail to call the office back

## 2024-02-23 NOTE — Telephone Encounter (Signed)
 Pt called back, he wants to let sheena know he called and they have no records/request for PT at home

## 2024-02-23 NOTE — Telephone Encounter (Signed)
 [facility] not in NETWORK with Medicaid nor [facility], or [facility].

## 2024-02-23 NOTE — Telephone Encounter (Signed)
 Pt. Calling Sheena back

## 2024-02-23 NOTE — Telephone Encounter (Signed)
 New referral sent to [facility].

## 2024-02-24 ENCOUNTER — Telehealth: Payer: Self-pay

## 2024-02-24 DIAGNOSIS — G20A2 Parkinson's disease without dyskinesia, with fluctuations: Secondary | ICD-10-CM

## 2024-02-24 NOTE — Telephone Encounter (Signed)
 [facility] will take on patient PT.

## 2024-02-24 NOTE — Telephone Encounter (Signed)
 New referral needed for [facility].

## 2024-02-26 ENCOUNTER — Telehealth: Payer: Self-pay | Admitting: Neurology

## 2024-02-26 NOTE — Telephone Encounter (Signed)
 [facility] will take on patient PT.

## 2024-02-26 NOTE — Telephone Encounter (Signed)
 Pt called no answer left a voice mail to call the office back

## 2024-02-26 NOTE — Telephone Encounter (Signed)
 Pt called and LM with AN. He was returning a call to office

## 2024-02-27 NOTE — Telephone Encounter (Signed)
 Pt.s Physical therapist calling to get approval for eval and will try again next Friday please call [phone]

## 2024-02-27 NOTE — Telephone Encounter (Signed)
 Please call back to give verbal approval for PT.  What is the schedule?

## 2024-03-01 NOTE — Telephone Encounter (Signed)
 Pt called back in and left a message on 02/28/24 with the after hours service. He did not leave any details in the message.

## 2024-03-02 ENCOUNTER — Telehealth: Payer: Self-pay | Admitting: Neurology

## 2024-03-02 NOTE — Telephone Encounter (Signed)
LMOVM with verbal order

## 2024-03-02 NOTE — Telephone Encounter (Signed)
 Carlon Chester Physical Therapist needs verbal orders, 1 time a week for 1 week, 2 times 12 weeks and 1 time a week for 4 weeks, please call and leave voicemail at [phone]

## 2024-03-08 NOTE — Telephone Encounter (Signed)
 Spoke to [place] VM received of verbal order for PT.

## 2024-03-10 ENCOUNTER — Encounter (HOSPITAL_BASED_OUTPATIENT_CLINIC_OR_DEPARTMENT_OTHER): Payer: Self-pay

## 2024-03-10 ENCOUNTER — Emergency Department (HOSPITAL_BASED_OUTPATIENT_CLINIC_OR_DEPARTMENT_OTHER)

## 2024-03-10 ENCOUNTER — Inpatient Hospital Stay (HOSPITAL_BASED_OUTPATIENT_CLINIC_OR_DEPARTMENT_OTHER)
Admission: EM | Admit: 2024-03-10 | Discharge: 2024-03-16 | DRG: 175 | Disposition: A | Discharge status: Skilled Nursing Facility | Attending: Internal Medicine | Admitting: Internal Medicine

## 2024-03-10 ENCOUNTER — Other Ambulatory Visit: Payer: Self-pay

## 2024-03-10 DIAGNOSIS — R296 Repeated falls: Secondary | ICD-10-CM | POA: Diagnosis present

## 2024-03-10 DIAGNOSIS — Z791 Long term (current) use of non-steroidal anti-inflammatories (NSAID): Secondary | ICD-10-CM | POA: Diagnosis not present

## 2024-03-10 DIAGNOSIS — M7989 Other specified soft tissue disorders: Secondary | ICD-10-CM | POA: Diagnosis not present

## 2024-03-10 DIAGNOSIS — S2249XA Multiple fractures of ribs, unspecified side, initial encounter for closed fracture: Secondary | ICD-10-CM

## 2024-03-10 DIAGNOSIS — D649 Anemia, unspecified: Secondary | ICD-10-CM | POA: Diagnosis present

## 2024-03-10 DIAGNOSIS — I2699 Other pulmonary embolism without acute cor pulmonale: Secondary | ICD-10-CM | POA: Diagnosis not present

## 2024-03-10 DIAGNOSIS — Z87891 Personal history of nicotine dependence: Secondary | ICD-10-CM | POA: Diagnosis not present

## 2024-03-10 DIAGNOSIS — S2241XA Multiple fractures of ribs, right side, initial encounter for closed fracture: Secondary | ICD-10-CM | POA: Diagnosis present

## 2024-03-10 DIAGNOSIS — E871 Hypo-osmolality and hyponatremia: Secondary | ICD-10-CM

## 2024-03-10 DIAGNOSIS — M7711 Lateral epicondylitis, right elbow: Secondary | ICD-10-CM | POA: Insufficient documentation

## 2024-03-10 DIAGNOSIS — N179 Acute kidney failure, unspecified: Secondary | ICD-10-CM

## 2024-03-10 DIAGNOSIS — S02401A Maxillary fracture, unspecified, initial encounter for closed fracture: Secondary | ICD-10-CM

## 2024-03-10 DIAGNOSIS — R260 Ataxic gait: Secondary | ICD-10-CM | POA: Diagnosis present

## 2024-03-10 DIAGNOSIS — G20A2 Parkinson's disease without dyskinesia, with fluctuations: Secondary | ICD-10-CM | POA: Diagnosis present

## 2024-03-10 DIAGNOSIS — S3219XA Other fracture of sacrum, initial encounter for closed fracture: Secondary | ICD-10-CM | POA: Diagnosis present

## 2024-03-10 DIAGNOSIS — I10 Essential (primary) hypertension: Secondary | ICD-10-CM | POA: Diagnosis present

## 2024-03-10 DIAGNOSIS — S3210XA Unspecified fracture of sacrum, initial encounter for closed fracture: Secondary | ICD-10-CM

## 2024-03-10 DIAGNOSIS — K219 Gastro-esophageal reflux disease without esophagitis: Secondary | ICD-10-CM | POA: Diagnosis present

## 2024-03-10 DIAGNOSIS — E669 Obesity, unspecified: Secondary | ICD-10-CM | POA: Diagnosis present

## 2024-03-10 DIAGNOSIS — W19XXXA Unspecified fall, initial encounter: Secondary | ICD-10-CM | POA: Diagnosis present

## 2024-03-10 DIAGNOSIS — I959 Hypotension, unspecified: Secondary | ICD-10-CM | POA: Diagnosis present

## 2024-03-10 DIAGNOSIS — E876 Hypokalemia: Secondary | ICD-10-CM | POA: Diagnosis present

## 2024-03-10 DIAGNOSIS — S0240CA Maxillary fracture, right side, initial encounter for closed fracture: Secondary | ICD-10-CM | POA: Diagnosis present

## 2024-03-10 DIAGNOSIS — E860 Dehydration: Secondary | ICD-10-CM | POA: Diagnosis present

## 2024-03-10 DIAGNOSIS — Z8673 Personal history of transient ischemic attack (TIA), and cerebral infarction without residual deficits: Secondary | ICD-10-CM

## 2024-03-10 DIAGNOSIS — E872 Acidosis, unspecified: Secondary | ICD-10-CM | POA: Diagnosis not present

## 2024-03-10 DIAGNOSIS — G20A1 Parkinson's disease without dyskinesia, without mention of fluctuations: Secondary | ICD-10-CM | POA: Diagnosis not present

## 2024-03-10 DIAGNOSIS — I2609 Other pulmonary embolism with acute cor pulmonale: Secondary | ICD-10-CM | POA: Diagnosis not present

## 2024-03-10 DIAGNOSIS — Z6833 Body mass index (BMI) 33.0-33.9, adult: Secondary | ICD-10-CM

## 2024-03-10 DIAGNOSIS — S0292XA Unspecified fracture of facial bones, initial encounter for closed fracture: Secondary | ICD-10-CM

## 2024-03-10 DIAGNOSIS — R569 Unspecified convulsions: Secondary | ICD-10-CM | POA: Diagnosis not present

## 2024-03-10 DIAGNOSIS — S02839A Fracture of medial orbital wall, unspecified side, initial encounter for closed fracture: Secondary | ICD-10-CM | POA: Diagnosis present

## 2024-03-10 DIAGNOSIS — G9341 Metabolic encephalopathy: Secondary | ICD-10-CM | POA: Diagnosis present

## 2024-03-10 HISTORY — DX: Maxillary fracture, unspecified side, initial encounter for closed fracture: S02.401A

## 2024-03-10 HISTORY — DX: Hypokalemia: E87.6

## 2024-03-10 HISTORY — DX: Parkinson's disease without dyskinesia, without mention of fluctuations: G20.A1

## 2024-03-10 HISTORY — DX: Acute kidney failure, unspecified: N17.9

## 2024-03-10 HISTORY — DX: Other pulmonary embolism without acute cor pulmonale: I26.99

## 2024-03-10 HISTORY — DX: Hypo-osmolality and hyponatremia: E87.1

## 2024-03-10 LAB — CBC
HCT: 33.2 % — ABNORMAL LOW (ref 39.0–52.0)
Hemoglobin: 11.2 g/dL — ABNORMAL LOW (ref 13.0–17.0)
MCH: 28 pg (ref 26.0–34.0)
MCHC: 33.7 g/dL (ref 30.0–36.0)
MCV: 83 fL (ref 80.0–100.0)
Platelets: 250 10*3/uL (ref 150–400)
RBC: 4 MIL/uL — ABNORMAL LOW (ref 4.22–5.81)
RDW: 13.7 % (ref 11.5–15.5)
WBC: 11 10*3/uL — ABNORMAL HIGH (ref 4.0–10.5)
nRBC: 0 % (ref 0.0–0.2)

## 2024-03-10 LAB — URINALYSIS, ROUTINE W REFLEX MICROSCOPIC
Bacteria, UA: NONE SEEN
Bilirubin Urine: NEGATIVE
Glucose, UA: NEGATIVE mg/dL
Ketones, ur: 15 mg/dL — AB
Leukocytes,Ua: NEGATIVE
Nitrite: NEGATIVE
Specific Gravity, Urine: >1.046 — ABNORMAL HIGH (ref 1.005–1.030)
pH: 6.5 (ref 5.0–8.0)

## 2024-03-10 LAB — TSH: TSH: 1.87 u[IU]/mL (ref 0.350–4.500)

## 2024-03-10 LAB — COMPREHENSIVE METABOLIC PANEL WITH GFR
ALT: <5 U/L (ref 0–44)
AST: 25 U/L (ref 15–41)
Albumin: 4.4 g/dL (ref 3.5–5.0)
Alkaline Phosphatase: 129 U/L — ABNORMAL HIGH (ref 38–126)
Anion gap: 18 — ABNORMAL HIGH (ref 5–15)
BUN: 26 mg/dL — ABNORMAL HIGH (ref 8–23)
CO2: 19 mmol/L — ABNORMAL LOW (ref 22–32)
Calcium: 9.6 mg/dL (ref 8.9–10.3)
Chloride: 97 mmol/L — ABNORMAL LOW (ref 98–111)
Creatinine, Ser: 1.99 mg/dL — ABNORMAL HIGH (ref 0.61–1.24)
GFR, Estimated: 37 mL/min — ABNORMAL LOW (ref >60)
Glucose, Bld: 115 mg/dL — ABNORMAL HIGH (ref 70–99)
Potassium: 2.8 mmol/L — ABNORMAL LOW (ref 3.5–5.1)
Sodium: 134 mmol/L — ABNORMAL LOW (ref 135–145)
Total Bilirubin: 1.3 mg/dL — ABNORMAL HIGH (ref 0.0–1.2)
Total Protein: 7.3 g/dL (ref 6.5–8.1)

## 2024-03-10 LAB — CBG MONITORING, ED: Glucose-Capillary: 119 mg/dL — ABNORMAL HIGH (ref 70–99)

## 2024-03-10 LAB — LACTIC ACID, PLASMA
Lactic Acid, Venous: 1.9 mmol/L (ref 0.5–1.9)
Lactic Acid, Venous: 1.4 mmol/L (ref 0.5–1.9)

## 2024-03-10 LAB — TROPONIN T, HIGH SENSITIVITY: Troponin T High Sensitivity: 39 ng/L — ABNORMAL HIGH (ref <19)

## 2024-03-10 LAB — AMMONIA: Ammonia: 14 umol/L (ref 9–35)

## 2024-03-10 LAB — TROPONIN I (HIGH SENSITIVITY): Troponin I (High Sensitivity): 50 ng/L — ABNORMAL HIGH (ref <18)

## 2024-03-10 MED ORDER — POTASSIUM CHLORIDE 10 MEQ/100ML IV SOLN
10.0000 meq | INTRAVENOUS | Status: AC
  Administered 2024-03-10 (×2): 10 meq via INTRAVENOUS
  Filled 2024-03-10 (×2): qty 100

## 2024-03-10 MED ORDER — CARBIDOPA-LEVODOPA ER 50-200 MG PO TBCR
1.0000 | EXTENDED_RELEASE_TABLET | Freq: Every day | ORAL | Status: DC
  Administered 2024-03-10 – 2024-03-16 (×33): 1 via ORAL
  Filled 2024-03-10 (×38): qty 1

## 2024-03-10 MED ORDER — HEPARIN BOLUS VIA INFUSION
6000.0000 [IU] | Freq: Once | INTRAVENOUS | Status: AC
  Administered 2024-03-10: 6000 [IU] via INTRAVENOUS

## 2024-03-10 MED ORDER — LACTATED RINGERS IV SOLN: INTRAVENOUS | Status: AC

## 2024-03-10 MED ORDER — PANTOPRAZOLE SODIUM 40 MG PO TBEC
40.0000 mg | DELAYED_RELEASE_TABLET | Freq: Every day | ORAL | Status: DC
  Administered 2024-03-11 – 2024-03-16 (×6): 40 mg via ORAL
  Filled 2024-03-10 (×6): qty 1

## 2024-03-10 MED ORDER — IOHEXOL 300 MG/ML  SOLN
100.0000 mL | Freq: Once | INTRAMUSCULAR | Status: AC | PRN
  Administered 2024-03-10: 100 mL via INTRAVENOUS

## 2024-03-10 MED ORDER — MELATONIN 3 MG PO TABS
3.0000 mg | ORAL_TABLET | Freq: Once | ORAL | Status: AC
  Administered 2024-03-11: 3 mg via ORAL
  Filled 2024-03-10: qty 1

## 2024-03-10 MED ORDER — POTASSIUM CHLORIDE CRYS ER 20 MEQ PO TBCR
40.0000 meq | EXTENDED_RELEASE_TABLET | Freq: Once | ORAL | Status: AC
  Administered 2024-03-10: 40 meq via ORAL
  Filled 2024-03-10: qty 2

## 2024-03-10 MED ORDER — HEPARIN SODIUM (PORCINE) 5000 UNIT/ML IJ SOLN
4000.0000 [IU] | Freq: Once | INTRAMUSCULAR | Status: DC
  Filled 2024-03-10: qty 1

## 2024-03-10 MED ORDER — SODIUM CHLORIDE 0.9 % IV BOLUS
1000.0000 mL | Freq: Once | INTRAVENOUS | Status: AC
  Administered 2024-03-10: 1000 mL via INTRAVENOUS

## 2024-03-10 MED ORDER — HEPARIN (PORCINE) 25000 UT/250ML-% IV SOLN
1350.0000 [IU]/h | INTRAVENOUS | Status: AC
  Administered 2024-03-10: 1800 [IU]/h via INTRAVENOUS
  Administered 2024-03-11: 1550 [IU]/h via INTRAVENOUS
  Administered 2024-03-11: 1700 [IU]/h via INTRAVENOUS
  Filled 2024-03-10 (×3): qty 250

## 2024-03-10 MED ORDER — HEPARIN BOLUS VIA INFUSION: 6500.0000 [IU] | Freq: Once | INTRAVENOUS | Status: DC

## 2024-03-10 MED ORDER — ACETAMINOPHEN 325 MG PO TABS
650.0000 mg | ORAL_TABLET | Freq: Four times a day (QID) | ORAL | Status: DC | PRN
  Administered 2024-03-11: 650 mg via ORAL
  Filled 2024-03-10: qty 2

## 2024-03-10 MED ORDER — SODIUM CHLORIDE 0.9 % IV SOLN: Freq: Once | INTRAVENOUS | Status: AC

## 2024-03-10 MED ORDER — ACETAMINOPHEN 650 MG RE SUPP: 650.0000 mg | Freq: Four times a day (QID) | RECTAL | Status: DC | PRN

## 2024-03-10 NOTE — H&P (Incomplete)
 History and Physical    John [name] FMW:[id number] DOB: 28-Jul-1961 DOA: 03/10/2024  Patient coming from: Home.  Chief Complaint: Fall and confusion.  HPI: John [name] is a 63 y.o. male with history of Parkinson's disease anemia was brought to the ER after patient was found to be having recurrent falls and was found to be confused as per patient's sister.  Patient states that he has been getting short of breath easily with exertion last few weeks.  Has had at least 4 falls in the last few days.  Last one he fell onto his face.  Denies any chest pain productive cough.  ED Course: In the ER patient was found to be tachycardic with blood pressure in the low normal which improved with fluid bolus.  CT head chest abdomen pelvis shows bilateral pulmonary embolism with strain pattern.  Blood pressure improved with fluids so patient was started on heparin and admitted to hospitalist.  In addition patient's CAT scan also shows displaced fracture of the right maxillary sinus with fracture of the inferior rami and medial wall right orbit and also rib fractures and S3 fractures.  Patient admitted for further workup.  Labs show hemoglobin of 11.2 creatinine 1.9 potassium 2.8 sodium 134.  Review of Systems: As per HPI, rest all negative.   Past Medical History:  Diagnosis Date   Hypertension    Parkinson's disease (HCC)    Stroke [facility])     History reviewed. No pertinent surgical history.   reports that he has quit smoking. His smoking use included cigars. He has never used smokeless tobacco. He reports current alcohol use of about 1.0 - 2.0 standard drink of alcohol per week. He reports that he does not use drugs.  No Known Allergies  Family History  Problem Relation Age of Onset   Cancer Father     Prior to Admission medications   Medication Sig Start Date End Date Taking? Authorizing Provider  acetaminophen  (TYLENOL ) 500 MG tablet Take 500 mg by mouth every 6 (six) hours as needed.    Yes [provider]  carbidopa -levodopa  (SINEMET  CR) 50-200 MG tablet Take 1 tablet by mouth 6 (six) times daily. (4-5 AM, 8 AM, 11 AM, 2 PM, 5 PM, 8 PM). 12/08/23  Yes Jaffe, Adam R, DO  ibuprofen (ADVIL) 200 MG tablet Take 200 mg by mouth every 6 (six) hours as needed.   Yes [provider]  losartan -hydrochlorothiazide (HYZAAR) 50-12.5 MG tablet Take 1 tablet by mouth daily. 12/10/23  Yes [name], MD  omeprazole  (PRILOSEC ) 20 MG capsule Take 1 capsule (20 mg total) by mouth daily. 12/10/23  Yes Lalonde, John C, MD  meloxicam (MOBIC) 7.5 MG tablet Take 7.5 mg by mouth daily. Patient not taking: Reported on 03/10/2024 02/28/24   [provider]  tamsulosin  (FLOMAX ) 0.4 MG CAPS capsule Take 1 capsule (0.4 mg total) by mouth daily. Patient not taking: Reported on 03/10/2024 02/10/24   Tysinger, Alm RAMAN, PA-C  tiZANidine (ZANAFLEX) 2 MG tablet Take 2 mg by mouth at bedtime. Patient not taking: Reported on 03/10/2024 02/28/24   [provider]    Physical Exam: Constitutional: Moderately built and nourished. Vitals:   03/10/24 1415 03/10/24 1530 03/10/24 1730 03/10/24 1906  BP: 91/71 (!) 127/59 128/84 (!) 150/82  Pulse: 91 92 99 75  Resp: 19 18 15 18   Temp:    98.9 F (37.2 C)  TempSrc:    Oral  SpO2: 97% 100% 100% 99%  Weight:  Height:       Eyes: Anicteric no pallor. ENMT: No discharge from the ears eyes nose and mouth. Neck: No mass felt.  No neck rigidity. Respiratory: No rhonchi crepitations. Cardiovascular: S1-S2 heard. Abdomen: Soft nontender bowel sound present. Musculoskeletal: No edema. Skin: No rash. Neurologic: Alert awake oriented to time place and person.  Moves all extremities 5 x 5. Psychiatric: Appears normal.  Normal affect.   Labs on Admission: I have personally reviewed following labs and imaging studies  CBC: Recent Labs  Lab 03/10/24 1330  WBC 11.0*  HGB 11.2*  HCT 33.2*  MCV 83.0  PLT 250   Basic Metabolic  Panel: Recent Labs  Lab 03/10/24 1330  NA 134*  K 2.8*  CL 97*  CO2 19*  GLUCOSE 115*  BUN 26*  CREATININE 1.99*  CALCIUM 9.6   GFR: Estimated Creatinine Clearance: 52.3 mL/min (A) (by C-G formula based on SCr of 1.99 mg/dL (H)). Liver Function Tests: Recent Labs  Lab 03/10/24 1330  AST 25  ALT <5  ALKPHOS 129*  BILITOT 1.3*  PROT 7.3  ALBUMIN 4.4   No results for input(s): LIPASE, AMYLASE in the last 168 hours. Recent Labs  Lab 03/10/24 1410  AMMONIA 14   Coagulation Profile: No results for input(s): INR, PROTIME in the last 168 hours. Cardiac Enzymes: No results for input(s): CKTOTAL, CKMB, CKMBINDEX, TROPONINI in the last 168 hours. BNP (last 3 results) No results for input(s): PROBNP in the last 8760 hours. HbA1C: No results for input(s): HGBA1C in the last 72 hours. CBG: Recent Labs  Lab 03/10/24 1331  GLUCAP 119*   Lipid Profile: No results for input(s): CHOL, HDL, LDLCALC, TRIG, CHOLHDL, LDLDIRECT in the last 72 hours. Thyroid Function Tests: Recent Labs    03/10/24 1410  TSH 1.870   Anemia Panel: No results for input(s): VITAMINB12, FOLATE, FERRITIN, TIBC, IRON, RETICCTPCT in the last 72 hours. Urine analysis:    Component Value Date/Time   COLORURINE YELLOW 03/10/2024 1330   APPEARANCEUR CLEAR 03/10/2024 1330   LABSPEC >1.046 (H) 03/10/2024 1330   LABSPEC 1.010 02/10/2024 1448   PHURINE 6.5 03/10/2024 1330   GLUCOSEU NEGATIVE 03/10/2024 1330   HGBUR MODERATE (A) 03/10/2024 1330   BILIRUBINUR NEGATIVE 03/10/2024 1330   BILIRUBINUR negative 02/10/2024 1448   KETONESUR 15 (A) 03/10/2024 1330   PROTEINUR TRACE (A) 03/10/2024 1330   NITRITE NEGATIVE 03/10/2024 1330   LEUKOCYTESUR NEGATIVE 03/10/2024 1330   Sepsis Labs: @LABRCNTIP (procalcitonin:4,lacticidven:4) )No results found for this or any previous visit (from the past 240 hours).   Radiological Exams on Admission: CT Maxillofacial Wo  Contrast Result Date: 03/10/2024 CLINICAL DATA:  Facial trauma, blunt. Multiple recent falls. Partially visualized right maxillary sinus fracture on head CT today. EXAM: CT MAXILLOFACIAL WITHOUT CONTRAST TECHNIQUE: Multidetector CT imaging of the maxillofacial structures was performed. Multiplanar CT image reconstructions were also generated. RADIATION DOSE REDUCTION: This exam was performed according to the departmental dose-optimization program which includes automated exposure control, adjustment of the mA and/or kV according to patient size and/or use of iterative reconstruction technique. COMPARISON:  Head CT earlier today.  Maxillofacial CT 05/19/2021. FINDINGS: Osseous: Acute, mildly displaced fractures of the anterior and posterior/lateral walls of the right maxillary sinus with a small amount gas in the adjacent soft tissues. Acute nondisplaced fractures of the inferior right orbital rim and medial wall the right orbit. Unchanged mildly depressed fracture of the right orbital floor more posteriorly. No mandibular dislocation or destructive process. Orbits: Mild right periorbital soft tissue  swelling. No retrobulbar hematoma. Grossly intact globes. Sinuses: Right maxillary hemosinus. Clear mastoid air cells and middle ear cavities. Soft tissues: Right facial contusion. Limited intracranial: More fully evaluated on today's earlier head CT. IMPRESSION: 1. Acute mildly displaced fractures of the right maxillary sinus. 2. Acute nondisplaced fractures of the inferior rim and medial wall of the right orbit. Electronically Signed   By: Dasie Hamburg M.D.   On: 03/10/2024 17:08   CT CHEST ABDOMEN PELVIS W CONTRAST Result Date: 03/10/2024 CLINICAL DATA:  Polytrauma. EXAM: CT CHEST, ABDOMEN, AND PELVIS WITH CONTRAST TECHNIQUE: Multidetector CT imaging of the chest, abdomen and pelvis was performed following the standard protocol during bolus administration of intravenous contrast. RADIATION DOSE REDUCTION: This  exam was performed according to the departmental dose-optimization program which includes automated exposure control, adjustment of the mA and/or kV according to patient size and/or use of iterative reconstruction technique. CONTRAST:  100mL OMNIPAQUE IOHEXOL 300 MG/ML  SOLN COMPARISON:  None Available. FINDINGS: CT CHEST FINDINGS Cardiovascular: There is no cardiomegaly. Small pericardial effusion measuring 12 mm anterior to the heart. Mild atherosclerotic calcification of the thoracic aorta. No intimal dilatation or dissection. The origins of the great vessels of the aortic arch appear patent. Bilateral lobar and segmental pulmonary artery emboli. The RV/LV ratio is approximately 1 indicative of right heart straining. Mediastinum/Nodes: No hilar or mediastinal adenopathy. The esophagus and the thyroid gland are grossly unremarkable. No mediastinal fluid collection. Lungs/Pleura: No focal consolidation, pleural effusion, pneumothorax. The central airways are patent. Musculoskeletal: Old healed left posterior rib fractures. Fractures of the anterior right fifth and sixth ribs, likely subacute or old. Correlation with clinical exam and point tenderness recommended. There is osteopenia with degenerative changes of the spine. No definite acute osseous pathology. CT ABDOMEN PELVIS FINDINGS No intra-abdominal free air or free fluid. Hepatobiliary: Small liver cyst. No medial tension. The gallbladder is unremarkable. Pancreas: Unremarkable. No pancreatic ductal dilatation or surrounding inflammatory changes. Spleen: Normal in size without focal abnormality. Adrenals/Urinary Tract: The adrenal glands unremarkable. There is no hydronephrosis on either side. Small exophytic left renal cyst. There is symmetric enhancement and excretion of contrast by both kidneys. The visualized ureters and urinary bladder appear unremarkable. Stomach/Bowel: There is no bowel obstruction or active inflammation. The appendix is normal.  Vascular/Lymphatic: The abdominal aorta and IVC unremarkable. No portal venous gas. There is no adenopathy. Reproductive: The prostate and seminal vesicles are grossly remarkable. No pelvic mass. Other: Small fat containing umbilical and right inguinal hernia. Musculoskeletal: Osteopenia with degenerative changes of the spine. There is nondisplaced fracture through the posterior elements of S3 (89/6 and 22/5). IMPRESSION: 1. Bilateral lobar and segmental pulmonary artery emboli with right heart straining. Correlation with EKG and troponin levels recommended. 2. Nondisplaced fracture through the posterior elements of S3. 3. Fractures of the anterior right fifth and sixth ribs, likely subacute or old. Correlation with clinical exam and point tenderness recommended. 4. No bowel obstruction. Normal appendix. 5.  Aortic Atherosclerosis (ICD10-I70.0). These results were called by telephone at the time of interpretation on 03/10/2024 at 3:27 pm to provider [facility] , who verbally acknowledged these results. Electronically Signed   By: Vanetta Chou M.D.   On: 03/10/2024 15:29   CT Head Wo Contrast Result Date: 03/10/2024 CLINICAL DATA:  Polytrauma, blunt falls, parkinsons; Polytrauma, blunt. Multiple falls in the past 2 days. Confusion. History of Parkinson's disease. EXAM: CT HEAD WITHOUT CONTRAST CT CERVICAL SPINE WITHOUT CONTRAST TECHNIQUE: Multidetector CT imaging of the head and cervical spine  was performed following the standard protocol without intravenous contrast. Multiplanar CT image reconstructions of the cervical spine were also generated. RADIATION DOSE REDUCTION: This exam was performed according to the departmental dose-optimization program which includes automated exposure control, adjustment of the mA and/or kV according to patient size and/or use of iterative reconstruction technique. COMPARISON:  CT head 05/19/2021 FINDINGS: CT HEAD FINDINGS Brain: There is no evidence of an acute  infarct, intracranial hemorrhage, mass, midline shift, or extra-axial fluid collection. There is mild cerebral atrophy. Mild chronic small vessel ischemia is again noted with an unchanged chronic lacunar infarct at the anterior aspect of the right basal ganglia. Vascular: No hyperdense vessel. Skull: No acute calvarial fracture or suspicious lesion. Sinuses/Orbits: Partially visualized minimally displaced fracture of the posterolateral wall of the right maxillary sinus with regional soft tissue gas and hemorrhage layering in the sinus. Old right orbital floor fracture. Clear mastoid air cells. Other: Partially visualized right facial soft tissue swelling. CT CERVICAL SPINE FINDINGS Alignment: Cervical spine straightening. Trace anterolisthesis of C4 on C5. Slight left convex curvature of the cervical spine. Skull base and vertebrae: No acute fracture or suspicious lesion. Soft tissues and spinal canal: No prevertebral fluid or swelling. No visible canal hematoma. Disc levels: Diffuse cervical spondylosis including prominent anterior vertebral osteophytes from C3-C7. 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 Moderate multilevel facet arthrosis. Upper chest: Clear lung apices. Other: None. IMPRESSION: 1. No evidence of acute intracranial abnormality or cervical spine fracture. 2. Partially visualized acute right maxillary sinus fracture. A maxillofacial CT is recommended to allow full characterization and assessment for any additional facial fractures. Electronically Signed   By: Dasie Hamburg M.D.   On: 03/10/2024 15:17   CT Cervical Spine Wo Contrast Result Date: 03/10/2024 CLINICAL DATA:  Polytrauma, blunt falls, parkinsons; Polytrauma, blunt. Multiple falls in the past 2 days. Confusion. History of Parkinson's disease. EXAM: CT HEAD WITHOUT CONTRAST CT  CERVICAL SPINE WITHOUT CONTRAST TECHNIQUE: Multidetector CT imaging of the head and cervical spine was performed following the standard protocol without intravenous contrast. Multiplanar CT image reconstructions of the cervical spine were also generated. RADIATION DOSE REDUCTION: This exam was performed according to the departmental dose-optimization program which includes automated exposure control, adjustment of the mA and/or kV according to patient size and/or use of iterative reconstruction technique. COMPARISON:  CT head 05/19/2021 FINDINGS: CT HEAD FINDINGS Brain: There is no evidence of an acute infarct, intracranial hemorrhage, mass, midline shift, or extra-axial fluid collection. There is mild cerebral atrophy. Mild chronic small vessel ischemia is again noted with an unchanged chronic lacunar infarct at the anterior aspect of the right basal ganglia. Vascular: No hyperdense vessel. Skull: No acute calvarial fracture or suspicious lesion. Sinuses/Orbits: Partially visualized minimally displaced fracture of the posterolateral wall of the right maxillary sinus with regional soft tissue gas and hemorrhage layering in the sinus. Old right orbital floor fracture. Clear mastoid air cells. Other: Partially visualized right facial soft tissue swelling. CT CERVICAL SPINE FINDINGS Alignment: Cervical spine straightening. Trace anterolisthesis of C4 on C5. Slight left convex curvature of the cervical spine. Skull base and vertebrae: No acute fracture or suspicious lesion. Soft tissues and spinal canal: No prevertebral fluid or swelling. No visible canal hematoma. Disc levels: Diffuse cervical spondylosis including prominent anterior vertebral osteophytes from C3-C7. A broad-based posterior disc osteophyte complex and ossification of the posterior longitudinal ligament at  C3-4 result in severe spinal stenosis and right greater than left neural foraminal stenosis. Moderate multilevel facet arthrosis. Upper chest: Clear  lung apices. Other: None. IMPRESSION: 1. No evidence of acute intracranial abnormality or cervical spine fracture. 2. Partially visualized acute right maxillary sinus fracture. A maxillofacial CT is recommended to allow full characterization and assessment for any additional facial fractures. Electronically Signed   By: Dasie Hamburg M.D.   On: 03/10/2024 15:17   DG Shoulder Right Result Date: 03/10/2024 CLINICAL DATA:  Fall EXAM: RIGHT SHOULDER - 2+ VIEW COMPARISON:  None Available. FINDINGS: Advanced osteoarthritis in the right [facility] joint with joint space narrowing and spurring. Glenohumeral joint is slightly narrowed otherwise intact. No acute bony abnormality. Specifically, no fracture, subluxation, or dislocation. 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 No acute bony abnormality. Electronically Signed   By: Franky Crease M.D.   On: 03/10/2024 15:10    EKG: Independently reviewed.  Sinus tachycardia.  Assessment/Plan Principal Problem:   Acute pulmonary embolism (HCC) Active Problems:   Essential hypertension   Parkinson's disease with fluctuating manifestations (HCC)   ARF (acute renal failure) (HCC)   Hypokalemia   Hyponatremia   Maxillary fracture (HCC)   Anemia    Acute pulmonary embolism with strain pattern -     blood pressure initially was in the low normal.  Improved with fluids.  On heparin effusion.  Will trend cardiac markers check BNP and 2D echo.  Will transition to oral anticoagulants if remains stable. Fall with fractures including the right maxillary sinus and orbit  ribs  fifth and sixth and S3 fracture.  Will consult ENT for the maxillary fracture.  Physical therapy consult. Acute renal failure with hyponatremia and hypokalemia likely from dehydration and poor oral intake.  Patient received fluid bolus and is on IV infusion.  Replace potassium.  Follow metabolic panel. Acute encephalopathy likely from confusion resolved at the time of my exam. Parkinson's  disease on levodopa . Anemia appears to be chronic.-Will follow CBC. GERD on PPI.  Since patient has acute pulmonary embolism with possible strain pattern will need close monitoring and further workup and more than 2 midnight stay.   DVT prophylaxis: Heparin infusion. Code Status: Full code. Family Communication: Discussed with patient. Disposition Plan: Progressive care. Consults called: Physical therapy.  Will consult ENT. Admission status: Inpatient.

## 2024-03-10 NOTE — Progress Notes (Addendum)
 ANTICOAGULATION CONSULT NOTE  Pharmacy Consult for Heparin Indication: pulmonary embolus  No Known Allergies  Patient Measurements: Height: 6' 2 (188 cm) Weight: 120 kg (264 lb 8.8 oz) IBW/kg (Calculated) : 82.2 Heparin Dosing Weight: 107.9kg  Vital Signs: Temp: 98.2 F (36.8 C) (06/25 1325) BP: 91/71 (06/25 1415) Pulse Rate: 91 (06/25 1415)  Labs: Recent Labs    03/10/24 1330  HGB 11.2*  HCT 33.2*  PLT 250  CREATININE 1.99*    Estimated Creatinine Clearance: 52.3 mL/min (A) (by C-G formula based on SCr of 1.99 mg/dL (H)).   Medical History: Past Medical History:  Diagnosis Date   Hypertension    Parkinson's disease (HCC)    Stroke (HCC)     Medications:  (Not in a hospital admission)  Scheduled:  Infusions:   sodium chloride     potassium chloride     sodium chloride     PRN:   Assessment: 62 yom with a history of Parkinson's. Patient is presenting with 4 falls in the past 2 days. Heparin per pharmacy consult placed for pulmonary embolus.  CT w/ Bilateral lobar and segmental pulmonary artery emboli with right heart straining  Patient is not on anticoagulation prior to arrival.  Hgb 11.2; plt 250  Goal of Therapy:  Heparin level 0.3-0.7 units/ml Monitor platelets by anticoagulation protocol: Yes   Plan:  Give IV heparin 6000 units bolus x 1 Start heparin infusion at 1800 units/hr Check anti-Xa level in 8 hours and daily while on heparin Continue to monitor H&H and platelets  Dorn Buttner, PharmD, BCPS 03/10/2024 3:35 PM ED Clinical Pharmacist -  [phone]

## 2024-03-10 NOTE — ED Provider Notes (Signed)
 [place] EMERGENCY DEPARTMENT AT [facility] Provider Note   CSN: [id number] Arrival date & time: 03/10/24  1317     Patient presents with: John [name] is a 63 y.o. male.   The history is provided by the patient, a relative and medical records. No language interpreter was used.  Fall This is a recurrent problem. The current episode started more than 2 days ago. The problem occurs constantly. The problem has not changed since onset.Associated symptoms include headaches. Pertinent negatives include no chest pain, no abdominal pain and no shortness of breath. Nothing aggravates the symptoms. Nothing relieves the symptoms. He has tried nothing for the symptoms. The treatment provided no relief.       Prior to Admission medications   Medication Sig Start Date End Date Taking? Authorizing Provider  acetaminophen  (TYLENOL ) 500 MG tablet Take 500 mg by mouth every 6 (six) hours as needed.    [provider]  carbidopa -levodopa  (SINEMET  CR) 50-200 MG tablet Take 1 tablet by mouth 6 (six) times daily. (4-5 AM, 8 AM, 11 AM, 2 PM, 5 PM, 8 PM). 12/08/23   Jaffe, Adam R, DO  ibuprofen (ADVIL) 200 MG tablet Take 200 mg by mouth every 6 (six) hours as needed.    [provider]  indomethacin (INDOCIN) 25 MG capsule Take 25 mg by mouth 2 (two) times daily with a meal. Patient not taking: Reported on 02/10/2024    [provider]  losartan -hydrochlorothiazide (HYZAAR) 50-12.5 MG tablet Take 1 tablet by mouth daily. 12/10/23   [name], MD  omeprazole  (PRILOSEC ) 20 MG capsule Take 1 capsule (20 mg total) by mouth daily. 12/10/23   [name], MD  tamsulosin  (FLOMAX ) 0.4 MG CAPS capsule Take 1 capsule (0.4 mg total) by mouth daily. 02/10/24   Tysinger, Alm RAMAN, PA-C    Allergies: Patient has no known allergies.    Review of Systems  Constitutional:  Positive for fatigue. Negative for chills and fever.  HENT:  Negative for congestion.   Eyes:   Negative for visual disturbance.  Respiratory:  Negative for cough, chest tightness, shortness of breath and wheezing.   Cardiovascular:  Negative for chest pain, palpitations and leg swelling.  Gastrointestinal:  Negative for abdominal pain, constipation, diarrhea, nausea and vomiting.  Genitourinary:  Negative for dysuria and frequency.  Musculoskeletal:  Positive for back pain. Negative for neck pain and neck stiffness.  Skin:  Positive for color change (bruising). Negative for rash.  Neurological:  Positive for speech difficulty, light-headedness and headaches. Negative for dizziness, syncope and weakness.  Psychiatric/Behavioral:  Positive for confusion. Negative for agitation.   All other systems reviewed and are negative.   Updated Vital Signs BP 96/69   Pulse 90   Temp 98.2 F (36.8 C)   Resp (!) 29   Ht 6' 2 (1.88 m)   Wt 120 kg   SpO2 96%   BMI 33.97 kg/m   Physical Exam Vitals and nursing note reviewed.  Constitutional:      General: He is not in acute distress.    Appearance: He is well-developed. He is not ill-appearing, toxic-appearing or diaphoretic.  HENT:     Head: Atraumatic.     Comments: Dried epistaxis in right nare with no nasal septal hematoma seen.  Bruising around the right orbit but intact extraocular movements.  Bruising on the right upper chest and low back.  Bruising on the left shin.  Dry mucous membranes.  Nose: No congestion or rhinorrhea.     Mouth/Throat:     Pharynx: No oropharyngeal exudate or posterior oropharyngeal erythema.   Eyes:     Extraocular Movements: Extraocular movements intact.     Conjunctiva/sclera: Conjunctivae normal.     Pupils: Pupils are equal, round, and reactive to light.    Cardiovascular:     Rate and Rhythm: Normal rate and regular rhythm.     Heart sounds: No murmur heard. Pulmonary:     Effort: Pulmonary effort is normal. No respiratory distress.     Breath sounds: Normal breath sounds. No wheezing,  rhonchi or rales.  Chest:     Chest wall: No tenderness.  Abdominal:     Palpations: Abdomen is soft.     Tenderness: There is no abdominal tenderness. There is no right CVA tenderness, left CVA tenderness, guarding or rebound.   Musculoskeletal:        General: Tenderness present. No swelling.     Cervical back: Neck supple. No tenderness.   Skin:    General: Skin is warm and dry.     Capillary Refill: Capillary refill takes less than 2 seconds.     Findings: Bruising present.   Neurological:     Mental Status: He is alert.     Sensory: No sensory deficit.     Motor: No weakness.   Psychiatric:        Mood and Affect: Mood normal.     (all labs ordered are listed, but only abnormal results are displayed) Labs Reviewed  COMPREHENSIVE METABOLIC PANEL WITH GFR - Abnormal; Notable for the following components:      Result Value   Sodium 134 (*)    Potassium 2.8 (*)    Chloride 97 (*)    CO2 19 (*)    Glucose, Bld 115 (*)    BUN 26 (*)    Creatinine, Ser 1.99 (*)    Alkaline Phosphatase 129 (*)    Total Bilirubin 1.3 (*)    GFR, Estimated 37 (*)    Anion gap 18 (*)    All other components within normal limits  CBC - Abnormal; Notable for the following components:   WBC 11.0 (*)    RBC 4.00 (*)    Hemoglobin 11.2 (*)    HCT 33.2 (*)    All other components within normal limits  CBG MONITORING, ED - Abnormal; Notable for the following components:   Glucose-Capillary 119 (*)    All other components within normal limits  TSH  LACTIC ACID, PLASMA  AMMONIA  URINALYSIS, ROUTINE W REFLEX MICROSCOPIC  LACTIC ACID, PLASMA  TROPONIN T, HIGH SENSITIVITY    EKG: EKG Interpretation Date/Time:  Wednesday March 10 2024 13:35:07 EDT Ventricular Rate:  89 PR Interval:  185 QRS Duration:  102 QT Interval:  390 QTC Calculation: 475 R Axis:   41  Text Interpretation: Sinus rhythm Consider left atrial enlargement Low voltage, precordial leads Minimal ST depression, diffuse  leads when compared to ecg earlierm less wandering baseline No STEMI Confirmed by Ginger Barefoot ([id number]) on 03/10/2024 1:48:30 PM  Radiology: CT CHEST ABDOMEN PELVIS W CONTRAST Result Date: 03/10/2024 CLINICAL DATA:  Polytrauma. EXAM: CT CHEST, ABDOMEN, AND PELVIS WITH CONTRAST TECHNIQUE: Multidetector CT imaging of the chest, abdomen and pelvis was performed following the standard protocol during bolus administration of intravenous contrast. RADIATION DOSE REDUCTION: This exam was performed according to the departmental dose-optimization program which includes automated exposure control, adjustment of the mA and/or kV according  to patient size and/or use of iterative reconstruction technique. CONTRAST:  100mL OMNIPAQUE IOHEXOL 300 MG/ML  SOLN COMPARISON:  None Available. FINDINGS: CT CHEST FINDINGS Cardiovascular: There is no cardiomegaly. Small pericardial effusion measuring 12 mm anterior to the heart. Mild atherosclerotic calcification of the thoracic aorta. No intimal dilatation or dissection. The origins of the great vessels of the aortic arch appear patent. Bilateral lobar and segmental pulmonary artery emboli. The RV/LV ratio is approximately 1 indicative of right heart straining. Mediastinum/Nodes: No hilar or mediastinal adenopathy. The esophagus and the thyroid gland are grossly unremarkable. No mediastinal fluid collection. Lungs/Pleura: No focal consolidation, pleural effusion, pneumothorax. The central airways are patent. Musculoskeletal: Old healed left posterior rib fractures. Fractures of the anterior right fifth and sixth ribs, likely subacute or old. Correlation with clinical exam and point tenderness recommended. There is osteopenia with degenerative changes of the spine. No definite acute osseous pathology. CT ABDOMEN PELVIS FINDINGS No intra-abdominal free air or free fluid. Hepatobiliary: Small liver cyst. No medial tension. The gallbladder is unremarkable. Pancreas: Unremarkable. No  pancreatic ductal dilatation or surrounding inflammatory changes. Spleen: Normal in size without focal abnormality. Adrenals/Urinary Tract: The adrenal glands unremarkable. There is no hydronephrosis on either side. Small exophytic left renal cyst. There is symmetric enhancement and excretion of contrast by both kidneys. The visualized ureters and urinary bladder appear unremarkable. Stomach/Bowel: There is no bowel obstruction or active inflammation. The appendix is normal. Vascular/Lymphatic: The abdominal aorta and IVC unremarkable. No portal venous gas. There is no adenopathy. Reproductive: The prostate and seminal vesicles are grossly remarkable. No pelvic mass. Other: Small fat containing umbilical and right inguinal hernia. Musculoskeletal: Osteopenia with degenerative changes of the spine. There is nondisplaced fracture through the posterior elements of S3 (89/6 and 22/5). IMPRESSION: 1. Bilateral lobar and segmental pulmonary artery emboli with right heart straining. Correlation with EKG and troponin levels recommended. 2. Nondisplaced fracture through the posterior elements of S3. 3. Fractures of the anterior right fifth and sixth ribs, likely subacute or old. Correlation with clinical exam and point tenderness recommended. 4. No bowel obstruction. Normal appendix. 5.  Aortic Atherosclerosis (ICD10-I70.0). These results were called by telephone at the time of interpretation on 03/10/2024 at 3:27 pm to provider [facility] , who verbally acknowledged these results. Electronically Signed   By: Vanetta Chou M.D.   On: 03/10/2024 15:29   CT Head Wo Contrast Result Date: 03/10/2024 CLINICAL DATA:  Polytrauma, blunt falls, parkinsons; Polytrauma, blunt. Multiple falls in the past 2 days. Confusion. History of Parkinson's disease. EXAM: CT HEAD WITHOUT CONTRAST CT CERVICAL SPINE WITHOUT CONTRAST TECHNIQUE: Multidetector CT imaging of the head and cervical spine was performed following the standard  protocol without intravenous contrast. Multiplanar CT image reconstructions of the cervical spine were also generated. RADIATION DOSE REDUCTION: This exam was performed according to the departmental dose-optimization program which includes automated exposure control, adjustment of the mA and/or kV according to patient size and/or use of iterative reconstruction technique. COMPARISON:  CT head 05/19/2021 FINDINGS: CT HEAD FINDINGS Brain: There is no evidence of an acute infarct, intracranial hemorrhage, mass, midline shift, or extra-axial fluid collection. There is mild cerebral atrophy. Mild chronic small vessel ischemia is again noted with an unchanged chronic lacunar infarct at the anterior aspect of the right basal ganglia. Vascular: No hyperdense vessel. Skull: No acute calvarial fracture or suspicious lesion. Sinuses/Orbits: Partially visualized minimally displaced fracture of the posterolateral wall of the right maxillary sinus with regional soft tissue gas and hemorrhage layering  in the sinus. Old right orbital floor fracture. Clear mastoid air cells. Other: Partially visualized right facial soft tissue swelling. CT CERVICAL SPINE FINDINGS Alignment: Cervical spine straightening. Trace anterolisthesis of C4 on C5. Slight left convex curvature of the cervical spine. Skull base and vertebrae: No acute fracture or suspicious lesion. Soft tissues and spinal canal: No prevertebral fluid or swelling. No visible canal hematoma. Disc levels: Diffuse cervical spondylosis including prominent anterior vertebral osteophytes from C3-C7. 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 Moderate multilevel facet arthrosis. Upper chest: Clear lung apices. Other: None. IMPRESSION: 1. No evidence of acute intracranial abnormality or cervical spine fracture. 2. Partially  visualized acute right maxillary sinus fracture. A maxillofacial CT is recommended to allow full characterization and assessment for any additional facial fractures. Electronically Signed   By: Dasie Hamburg M.D.   On: 03/10/2024 15:17   CT Cervical Spine Wo Contrast Result Date: 03/10/2024 CLINICAL DATA:  Polytrauma, blunt falls, parkinsons; Polytrauma, blunt. Multiple falls in the past 2 days. Confusion. History of Parkinson's disease. EXAM: CT HEAD WITHOUT CONTRAST CT CERVICAL SPINE WITHOUT CONTRAST TECHNIQUE: Multidetector CT imaging of the head and cervical spine was performed following the standard protocol without intravenous contrast. Multiplanar CT image reconstructions of the cervical spine were also generated. RADIATION DOSE REDUCTION: This exam was performed according to the departmental dose-optimization program which includes automated exposure control, adjustment of the mA and/or kV according to patient size and/or use of iterative reconstruction technique. COMPARISON:  CT head 05/19/2021 FINDINGS: CT HEAD FINDINGS Brain: There is no evidence of an acute infarct, intracranial hemorrhage, mass, midline shift, or extra-axial fluid collection. There is mild cerebral atrophy. Mild chronic small vessel ischemia is again noted with an unchanged chronic lacunar infarct at the anterior aspect of the right basal ganglia. Vascular: No hyperdense vessel. Skull: No acute calvarial fracture or suspicious lesion. Sinuses/Orbits: Partially visualized minimally displaced fracture of the posterolateral wall of the right maxillary sinus with regional soft tissue gas and hemorrhage layering in the sinus. Old right orbital floor fracture. Clear mastoid air cells. Other: Partially visualized right facial soft tissue swelling. CT CERVICAL SPINE FINDINGS Alignment: Cervical spine straightening. Trace anterolisthesis of C4 on C5. Slight left convex curvature of the cervical spine. Skull base and vertebrae: No acute  fracture or suspicious lesion. Soft tissues and spinal canal: No prevertebral fluid or swelling. No visible canal hematoma. Disc levels: Diffuse cervical spondylosis including prominent anterior vertebral osteophytes from C3-C7. 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 Moderate multilevel facet arthrosis. Upper chest: Clear lung apices. Other: None. IMPRESSION: 1. No evidence of acute intracranial abnormality or cervical spine fracture. 2. Partially visualized acute right maxillary sinus fracture. A maxillofacial CT is recommended to allow full characterization and assessment for any additional facial fractures. Electronically Signed   By: Dasie Hamburg M.D.   On: 03/10/2024 15:17   DG Shoulder Right Result Date: 03/10/2024 CLINICAL DATA:  Fall EXAM: RIGHT SHOULDER - 2+ VIEW COMPARISON:  None Available. FINDINGS: Advanced osteoarthritis in the right [facility] joint with joint space narrowing and spurring. Glenohumeral joint is slightly narrowed otherwise intact. No acute bony abnormality. Specifically, no fracture, subluxation, or dislocation. 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 No acute bony abnormality. Electronically Signed  By: Franky Crease M.D.   On: 03/10/2024 15:10     Procedures     Medications Ordered in the ED  potassium chloride 10 mEq in 100 mL IVPB (has no administration in time range)  0.9 %  sodium chloride infusion (has no administration in time range)  sodium chloride 0.9 % bolus 1,000 mL (has no administration in time range)  heparin injection 4,000 Units (has no administration in time range)  sodium chloride 0.9 % bolus 1,000 mL (1,000 mLs Intravenous New Bag/Given 03/10/24 1415)  iohexol (OMNIPAQUE) 300 MG/ML solution 100 mL (100 mLs Intravenous Contrast Given 03/10/24 1451)                                     Medical Decision Making Amount and/or Complexity of Data Reviewed Labs: ordered. Radiology: ordered.  Risk Prescription drug management.    DJIBRIL GLOGOWSKI is a 63 y.o. male with a past medical history significant for hypertension, previous stroke, and Parkinson's disease who presents with multiple falls, slower speech, and fatigue.  According to the patient and family, patient has had multiple falls over the last few days hitting his head, chest, back, and shoulder on the ground.  He has had at least 4 falls since yesterday per family.  He denies any loss of consciousness but is having some mild headache.  He has bruising to his right face and around his orbit.  He also was feeling tired but is denying any shortness of breath, cough, fevers, chills or chest pain.  He denies any nausea, vomiting or abdominal pain.  Does report some low back pain.  Denies any pain in his extremities aside from the pain in his right shoulder.  He denies any urinary changes.  On arrival, patient was found to be tachycardic, tachypneic, and has soft pressures with hypotension.  He was not hypoxic nor was he febrile.  On my exam, lungs had some faint coarseness and chest was nontender.  Right shoulder was slightly tender and he has bruising on his back.  Abdomen nontender with good bowel sounds.  He had intact sensation and strength in extremities.  He has bruising on his right face and his right nare.  No nasal septal hematoma seen.  No tenderness on the neck.  EKG does not show STEMI.  Clinically I am concerned about possible traumatic injuries from numerous falls so we will get CTs of the head, neck and imaging of the chest/ab/pelvis.  We will get x-ray of the shoulder.  Will get screening labs to look for dehydration or acute infection that may be leading to these falls.  Care transferred to oncoming team to wait for workup results but anticipate admission given the patient's off pressures vital signs, and  multiple falls.  Care transferred in stable condition.            Final diagnoses:  Acute pulmonary embolism, unspecified pulmonary embolism type, unspecified whether acute cor pulmonale present (HCC)  Fall, initial encounter  Closed fracture of facial bone, unspecified facial bone, initial encounter (HCC)  Closed fracture of multiple ribs, unspecified laterality, initial encounter  Closed fracture of sacrum, unspecified portion of sacrum, initial encounter (HCC)   Clinical Impression: 1. Acute pulmonary embolism, unspecified pulmonary embolism type, unspecified whether acute cor pulmonale present (HCC)   2. Fall, initial encounter   3. Closed fracture of facial bone, unspecified facial bone, initial encounter (HCC)  4. Closed fracture of multiple ribs, unspecified laterality, initial encounter   5. Closed fracture of sacrum, unspecified portion of sacrum, initial encounter [facility])     Disposition: Care transferred to oncoming team to wait for workup results and disposition.  This note was prepared with assistance of Conservation officer, historic buildings. Occasional wrong-word or sound-a-like substitutions may have occurred due to the inherent limitations of voice recognition software.      Lailee Hoelzel, Lonni PARAS, MD 03/10/24 1537

## 2024-03-10 NOTE — Progress Notes (Signed)
 Pt arrived to 6e12 via [facility] from [facility] ED.

## 2024-03-10 NOTE — ED Notes (Signed)
 Pt unable to provide UA. Aware a specimen is needed.

## 2024-03-10 NOTE — ED Notes (Signed)
 Called [facility] at [facility] for transport

## 2024-03-10 NOTE — ED Notes (Signed)
 [facility] at bedside to transport pt to [facility]

## 2024-03-10 NOTE — ED Triage Notes (Signed)
 Patient has hx of parkinson's. 4 falls in past 2 days. Sister concerned about parkinson's progression. Sister states he is confused. Last time she saw I'm normal was 3-4 days ago. Patient answering questions slowly in triage.

## 2024-03-10 NOTE — ED Provider Notes (Addendum)
 Radiology did call.  Patient does have bilateral PEs may be some mild right heart strain.  But blood pressures improved to 130/59.  Hemodynamics are stable.  He is fairly asymptomatic.  He is got some rib fractures that I suspect are all is not particularly tender in his chest.  He is not really tender in his sacrum.  Ultimately we will start him on heparin.  Will admit him to hospitalist.  I do not think he is a thrombolytic candidate given his hemodynamic stability at this time.  Potassium being repleted.  IV fluids given.  This chart was dictated using voice recognition software.  Despite best efforts to proofread,  errors can occur which can change the documentation meaning.    Ruthe Cornet, DO 03/10/24 1617    Ruthe Cornet, DO 03/10/24 [phone]

## 2024-03-11 ENCOUNTER — Inpatient Hospital Stay (HOSPITAL_COMMUNITY)

## 2024-03-11 DIAGNOSIS — I2699 Other pulmonary embolism without acute cor pulmonale: Secondary | ICD-10-CM

## 2024-03-11 DIAGNOSIS — M7989 Other specified soft tissue disorders: Secondary | ICD-10-CM

## 2024-03-11 DIAGNOSIS — R296 Repeated falls: Secondary | ICD-10-CM | POA: Diagnosis not present

## 2024-03-11 DIAGNOSIS — N179 Acute kidney failure, unspecified: Secondary | ICD-10-CM | POA: Diagnosis not present

## 2024-03-11 LAB — CBC
HCT: 31.3 % — ABNORMAL LOW (ref 39.0–52.0)
Hemoglobin: 10.2 g/dL — ABNORMAL LOW (ref 13.0–17.0)
MCH: 27.6 pg (ref 26.0–34.0)
MCHC: 32.6 g/dL (ref 30.0–36.0)
MCV: 84.8 fL (ref 80.0–100.0)
Platelets: 217 10*3/uL (ref 150–400)
RBC: 3.69 MIL/uL — ABNORMAL LOW (ref 4.22–5.81)
RDW: 13.7 % (ref 11.5–15.5)
WBC: 6.8 10*3/uL (ref 4.0–10.5)
nRBC: 0 % (ref 0.0–0.2)

## 2024-03-11 LAB — COMPREHENSIVE METABOLIC PANEL WITH GFR
ALT: <5 U/L (ref 0–44)
AST: 29 U/L (ref 15–41)
Albumin: 3.4 g/dL — ABNORMAL LOW (ref 3.5–5.0)
Alkaline Phosphatase: 82 U/L (ref 38–126)
Anion gap: 12 (ref 5–15)
BUN: 22 mg/dL (ref 8–23)
CO2: 18 mmol/L — ABNORMAL LOW (ref 22–32)
Calcium: 8.6 mg/dL — ABNORMAL LOW (ref 8.9–10.3)
Chloride: 104 mmol/L (ref 98–111)
Creatinine, Ser: 1.55 mg/dL — ABNORMAL HIGH (ref 0.61–1.24)
GFR, Estimated: 50 mL/min — ABNORMAL LOW (ref >60)
Glucose, Bld: 97 mg/dL (ref 70–99)
Potassium: 2.8 mmol/L — ABNORMAL LOW (ref 3.5–5.1)
Sodium: 134 mmol/L — ABNORMAL LOW (ref 135–145)
Total Bilirubin: 1.3 mg/dL — ABNORMAL HIGH (ref 0.0–1.2)
Total Protein: 6.1 g/dL — ABNORMAL LOW (ref 6.5–8.1)

## 2024-03-11 LAB — MAGNESIUM: Magnesium: 1.6 mg/dL — ABNORMAL LOW (ref 1.7–2.4)

## 2024-03-11 LAB — VITAMIN B12: Vitamin B-12: 206 pg/mL (ref 180–914)

## 2024-03-11 LAB — ECHOCARDIOGRAM COMPLETE
AR max vel: 2.83 cm2
AV Area VTI: 2.77 cm2
AV Area mean vel: 2.47 cm2
AV Mean grad: 3 mmHg
AV Peak grad: 4.8 mmHg
Ao pk vel: 1.1 m/s
Area-P 1/2: 2.93 cm2
Height: 74 in
S' Lateral: 3.1 cm
Weight: 4232.83 [oz_av]

## 2024-03-11 LAB — FOLATE: Folate: 7.1 ng/mL (ref >5.9)

## 2024-03-11 LAB — IRON AND TIBC
Iron: 96 ug/dL (ref 45–182)
Saturation Ratios: 31 % (ref 17.9–39.5)
TIBC: 312 ug/dL (ref 250–450)
UIBC: 216 ug/dL

## 2024-03-11 LAB — HIV ANTIBODY (ROUTINE TESTING W REFLEX): HIV Screen 4th Generation wRfx: NONREACTIVE

## 2024-03-11 LAB — HEPARIN LEVEL (UNFRACTIONATED)
Heparin Unfractionated: 0.8 [IU]/mL — ABNORMAL HIGH (ref 0.30–0.70)
Heparin Unfractionated: 0.81 [IU]/mL — ABNORMAL HIGH (ref 0.30–0.70)
Heparin Unfractionated: 0.89 [IU]/mL — ABNORMAL HIGH (ref 0.30–0.70)

## 2024-03-11 LAB — RETICULOCYTES
Immature Retic Fract: 2.7 % (ref 2.3–15.9)
RBC.: 3.69 MIL/uL — ABNORMAL LOW (ref 4.22–5.81)
Retic Count, Absolute: 29.9 10*3/uL (ref 19.0–186.0)
Retic Ct Pct: 0.8 % (ref 0.4–3.1)

## 2024-03-11 LAB — FERRITIN: Ferritin: 158 ng/mL (ref 24–336)

## 2024-03-11 LAB — TROPONIN I (HIGH SENSITIVITY): Troponin I (High Sensitivity): 40 ng/L — ABNORMAL HIGH (ref <18)

## 2024-03-11 MED ORDER — TRAMADOL HCL 50 MG PO TABS
50.0000 mg | ORAL_TABLET | Freq: Four times a day (QID) | ORAL | Status: DC | PRN
  Administered 2024-03-11 – 2024-03-12 (×3): 50 mg via ORAL
  Filled 2024-03-11 (×3): qty 1

## 2024-03-11 MED ORDER — MAGNESIUM SULFATE 4 GM/100ML IV SOLN
4.0000 g | Freq: Once | INTRAVENOUS | Status: AC
  Administered 2024-03-11: 4 g via INTRAVENOUS
  Filled 2024-03-11: qty 100

## 2024-03-11 MED ORDER — POTASSIUM CHLORIDE 20 MEQ PO PACK
40.0000 meq | PACK | ORAL | Status: DC
  Administered 2024-03-11: 40 meq via ORAL
  Filled 2024-03-11: qty 2

## 2024-03-11 MED ORDER — POTASSIUM CHLORIDE CRYS ER 20 MEQ PO TBCR
40.0000 meq | EXTENDED_RELEASE_TABLET | ORAL | Status: AC
  Administered 2024-03-11: 40 meq via ORAL
  Filled 2024-03-11 (×2): qty 2

## 2024-03-11 NOTE — Progress Notes (Signed)
 ANTICOAGULATION CONSULT NOTE Pharmacy Consult for Heparin Indication: pulmonary embolus Brief A/P: Heparin level subtherapeutic Decrease Heparin rate  No Known Allergies  Patient Measurements: Height: 6' 2 (188 cm) Weight: 120 kg (264 lb 8.8 oz) IBW/kg (Calculated) : 82.2 Heparin Dosing Weight: 107.9kg  Vital Signs: Temp: 98.6 F (37 C) (06/26 0012) Temp Source: Oral (06/26 0012) BP: 131/79 (06/26 0012) Pulse Rate: 98 (06/26 0012)  Labs: Recent Labs    03/10/24 1330 03/10/24 1916 03/10/24 2347  HGB 11.2*  --   --   HCT 33.2*  --   --   PLT 250  --   --   HEPARINUNFRC  --   --  0.80*  CREATININE 1.99*  --   --   TROPONINIHS  --  50*  --     Estimated Creatinine Clearance: 52.3 mL/min (A) (by C-G formula based on SCr of 1.99 mg/dL (H)).  Assessment: 63 y.o. male with PE for heparin  Goal of Therapy:  Heparin level 0.3-0.7 units/ml Monitor platelets by anticoagulation protocol: Yes   Plan:  Decrease Heparin 1700 units/hr Follow-up am labs.  Cathlyn Arrant, PharmD, BCPS

## 2024-03-11 NOTE — Plan of Care (Signed)

## 2024-03-11 NOTE — Progress Notes (Signed)
 ANTICOAGULATION CONSULT NOTE  Pharmacy Consult for Heparin Indication: pulmonary embolus  No Known Allergies  Patient Measurements: Height: 6' 2 (188 cm) Weight: 120 kg (264 lb 8.8 oz) IBW/kg (Calculated) : 82.2 Heparin Dosing Weight: 107.9kg  Vital Signs: Temp: 98.6 F (37 C) (06/26 0858) Temp Source: Oral (06/26 0858) BP: 126/75 (06/26 0858) Pulse Rate: 71 (06/26 0858)  Labs: Recent Labs    03/10/24 1330 03/10/24 1916 03/10/24 2347 03/11/24 0457  HGB 11.2*  --   --  10.2*  HCT 33.2*  --   --  31.3*  PLT 250  --   --  217  HEPARINUNFRC  --   --  0.80*  --   CREATININE 1.99*  --   --  1.55*  TROPONINIHS  --  50*  --  40*    Estimated Creatinine Clearance: 67.1 mL/min (A) (by C-G formula based on SCr of 1.55 mg/dL (H)).   Medical History: Past Medical History:  Diagnosis Date   Hypertension    Parkinson's disease (HCC)    Stroke [facility])       Assessment: 21 yom with a history of Parkinson's. Patient is presenting with 4 falls in the past 2 days. Found to have BL segmental PE with RHS. No anticoagulation prior to admission. Pharmacy consulted for heparin.    Heparin level 0.81 is supratherapeutic on 1800 units/hr.  No issues with infusion or bleeding per RN. Heparin is running R arm, level draw from L hand.  Goal of Therapy:  Heparin level 0.3-0.7 units/ml Monitor platelets by anticoagulation protocol: Yes   Plan:  Decrease Heparin infusion 1550 units/hr F/u 6hr heparin level  Monitor daily heparin level, CBC, signs/symptoms of bleeding  F/u switch to apixaban   Jinnie Door, PharmD, BCPS, BCCP Clinical Pharmacist  Please check AMION for all [facility] phone numbers After 10:00 PM, call Main Pharmacy [phone]

## 2024-03-11 NOTE — Progress Notes (Addendum)
 PROGRESS NOTE   John [name]  FMW:[id number]    DOB: 07/23/1961    DOA: 03/10/2024  PCP: [name], MD   I have briefly reviewed patients previous medical records in [facility].   Brief Hospital Course:  63 year old male, lives alone, has a cane and walker but only uses the cane, mostly sedentary, medical history significant for Parkinson's disease, HTN, presented to the ED due to recurrent falls including falling face forward the last time and family noted patient to be confused.  He also reported getting shortness of breath with exertion for last few weeks.  In ED, tachycardic, hypotensive which improved after IV fluid bolus, CT C/A/P with contrast showed bilateral PE with right heart strain, right maxillary sinus and inferior orbital fractures, rib fractures and S3 fracture.  Admitted for acute pulmonary embolism, gait ataxia with frequent falls and multiple fractures as noted above, acute kidney injury and hypokalemia.   Assessment & Plan:   Acute pulmonary embolism without cor pulmonale CT C/A/P with contrast 6/25: Bilateral lobar and segmental pulmonary artery emboli with right heart straining. Transient hypotension (79/67) and tachycardia up to 119 in ED which promptly resolved after IV fluid boluses.  Remains hemodynamically stable Given that his right leg is more swollen than the left, will get lower extremity venous Dopplers to rule out DVT although the treatment is less likely to change Troponin I, 50 > 40.  Serial venous lactate is negative 2D echo: LVEF 60-65%, grade 1 diastolic dysfunction, RV size and function normal. Started empirically on IV heparin drip.  Pending echo results, consider switching to oral anticoagulation. Long discussion with patient that it can be dangerous if he is on anticoagulation and continues to have frequent falls which could lead to life-threatening bleeding complications.  He verbalized understanding.  Will get PT and OT to  evaluate.  Addendum: Lower extremity venous Dopplers negative for DVT.  Frequent falls with multiple injuries as noted below: CT maxillofacial 6/25: Acute mildly displaced fracture of the right maxillary sinus.  Acute nondisplaced fractures of the inferior rim and medial wall of the right orbit. CT head and C-spine: No acute abnormality CT C/A/P with contrast: Nondisplaced fracture through posterior elements of S3.  Fracture of anterior right 5th and 6th ribs, likely subacute or old. Concerned that this may be related to his Parkinson's disease with gait instability PT and OT evaluation pending for extremity venous Dopplers. Discussed with patient that there is a possibility that he may require STR at SNF due to physical deconditioning.  He verbalized understanding.  He also states that he has some family who can assist him in town Admitting MD tried to contact multiple ENT providers early this morning without success with each one of them telling him that they were not on-call.  Will try to figure this out a little later today. Will also check orthostatic vitals to make sure this is not the cause of his falls.  Addendum: Discussed with Dr. Elspeth Coddington, ENT who reviewed patient's imaging studies and indicating that the above fractures can be managed nonoperatively, patient to be advised that he should not blow his nose and outpatient follow-up in the office with him in a week's time.  Acute kidney injury Suspected due to dehydration Creatinine on 12/10/2023 was 1.09.  Presented with creatinine of 1.99. After IV fluids, this is improved to 1.55.  Continue IV fluids, avoid nephrotoxics, avoid hypotension and follow BMP in AM. Expect complete resolution.  Dehydration with hyponatremia IVF.  Encourage p.o. intake  Hypokalemia Continue to aggressively replace and follow. Check and replace magnesium  as needed.  Acute metabolic encephalopathy May be due to AKI, dehydration with  hyponatremia Transient and resolved by the time he presented to ED. Delirium precautions.  Parkinson's disease Follows with Dr. Skeet, [facility] neurology. Continue levodopa   Anemia Relatively stable.  Follow CBC while on IV heparin drip.  GERD PPI  Body mass index is 33.97 kg/m.   DVT prophylaxis:   Currently on IV heparin drip.   Code Status: Full Code:  Family Communication: None at bedside. Disposition:  Status is: Inpatient Remains inpatient appropriate because: IV heparin drip pending lower extremity venous Dopplers, PT and OT evaluation and may need STR for SNF.     Consultants:   Will attempt to call ENT consult later today.  Procedures:     Subjective:  History as noted above.  Asking for pain meds for his right facial injuries.  Denies dyspnea or chest pain at this time.  Denies prior history of VTE.  Denies headache, visual disturbances or any strokelike symptoms.  Denies history of ever passing out.  Continues to drive, advised him that this may not be safe to do given his ambulatory dysfunction and risks.  Objective:   Vitals:   03/10/24 1906 03/11/24 0012 03/11/24 0426 03/11/24 0858  BP: (!) 150/82 131/79 139/78 126/75  Pulse: 75 98 93 71  Resp: 18 20 20 17   Temp: 98.9 F (37.2 C) 98.6 F (37 C) 98.9 F (37.2 C) 98.6 F (37 C)  TempSrc: Oral Oral Oral Oral  SpO2: 99% 96% 98% 94%  Weight:      Height:        General exam: Middle-age male, moderately built and obese lying comfortably propped up in bed without distress.  Echo tech getting ready to perform echo. Respiratory system: Clear to auscultation. Respiratory effort normal. Cardiovascular system: S1 & S2 heard, RRR. No JVD, murmurs, rubs, gallops or clicks. No pedal edema.  Telemetry personally reviewed: Sinus rhythm.  Some tremor/baseline artifacts. Gastrointestinal system: Abdomen is nondistended, soft and nontender. No organomegaly or masses felt. Normal bowel sounds heard. Central nervous  system: Alert and oriented. No focal neurological deficits. Extremities: Symmetric 5 x 5 power.  Did not notice any tremors. Skin: No rashes, lesions or ulcers Psychiatry: Judgement and insight appear normal. Mood & affect appropriate.  HEENT: Right periorbital and upper maxillary bruising/ecchymosis.  Mild right eye temporal subconjunctival hemorrhage. PERTLA    Data Reviewed:   I have personally reviewed following labs and imaging studies   CBC: Recent Labs  Lab 03/10/24 1330 03/11/24 0457  WBC 11.0* 6.8  HGB 11.2* 10.2*  HCT 33.2* 31.3*  MCV 83.0 84.8  PLT 250 217    Basic Metabolic Panel: Recent Labs  Lab 03/10/24 1330 03/11/24 0457  NA 134* 134*  K 2.8* 2.8*  CL 97* 104  CO2 19* 18*  GLUCOSE 115* 97  BUN 26* 22  CREATININE 1.99* 1.55*  CALCIUM 9.6 8.6*    Liver Function Tests: Recent Labs  Lab 03/10/24 1330 03/11/24 0457  AST 25 29  ALT <5 <5  ALKPHOS 129* 82  BILITOT 1.3* 1.3*  PROT 7.3 6.1*  ALBUMIN 4.4 3.4*    CBG: Recent Labs  Lab 03/10/24 1331  GLUCAP 119*    Microbiology Studies:  No results found for this or any previous visit (from the past 240 hours).  Radiology Studies:  ECHOCARDIOGRAM COMPLETE Result Date: 03/11/2024    ECHOCARDIOGRAM  REPORT   Patient Name:   SAIGE BUSBY Date of Exam: 03/11/2024 Medical Rec #:  [id number]        Height:       74.0 in Accession #:    [id number]       Weight:       264.6 lb Date of Birth:  April 05, 1961       BSA:          2.448 m Patient Age:    62 years         BP:           139/78 mmHg Patient Gender: M                HR:           75 bpm. Exam Location:  Inpatient Procedure: 2D Echo, Cardiac Doppler and Color Doppler (Both Spectral and Color            Flow Doppler were utilized during procedure). Indications:     Pulmonary Embolus I26.09  History:         Patient has no prior history of Echocardiogram examinations.                  Stroke; Risk Factors:Hypertension.  Sonographer:     Jayson Gaskins  Referring Phys:  REDIA LOISE CLEAVER Diagnosing Phys: Salena Negri MD IMPRESSIONS  1. Left ventricular ejection fraction, by estimation, is 60 to 65%. The left ventricle has normal function. The left ventricle has no regional wall motion abnormalities. Left ventricular diastolic parameters are consistent with Grade I diastolic dysfunction (impaired relaxation).  2. Right ventricular systolic function is normal. The right ventricular size is normal.  3. Left atrial size was mildly dilated.  4. Right atrial size was mildly dilated.  5. There is no evidence of cardiac tamponade.  6. The mitral valve is grossly normal. Trivial mitral valve regurgitation.  7. The aortic valve is tricuspid. Aortic valve regurgitation is not visualized.  8. The inferior vena cava is normal in size with greater than 50% respiratory variability, suggesting right atrial pressure of 3 mmHg. FINDINGS  Left Ventricle: Left ventricular ejection fraction, by estimation, is 60 to 65%. The left ventricle has normal function. The left ventricle has no regional wall motion abnormalities. The left ventricular internal cavity size was normal in size. There is  no left ventricular hypertrophy. Left ventricular diastolic parameters are consistent with Grade I diastolic dysfunction (impaired relaxation). Right Ventricle: The right ventricular size is normal. No increase in right ventricular wall thickness. Right ventricular systolic function is normal. Left Atrium: Left atrial size was mildly dilated. Right Atrium: Right atrial size was mildly dilated. Pericardium: Trivial pericardial effusion is present. The pericardial effusion is circumferential. There is no evidence of cardiac tamponade. Mitral Valve: The mitral valve is grossly normal. Trivial mitral valve regurgitation. Tricuspid Valve: The tricuspid valve is normal in structure. Tricuspid valve regurgitation is trivial. Aortic Valve: The aortic valve is tricuspid. Aortic valve regurgitation is not  visualized. Aortic valve mean gradient measures 3.0 mmHg. Aortic valve peak gradient measures 4.8 mmHg. Aortic valve area, by VTI measures 2.77 cm. Pulmonic Valve: The pulmonic valve was normal in structure. Pulmonic valve regurgitation is trivial. Aorta: The aortic root is normal in size and structure. Venous: The inferior vena cava was not well visualized. The inferior vena cava is normal in size with greater than 50% respiratory variability, suggesting right atrial pressure of 3 mmHg. IAS/Shunts: The atrial septum is  grossly normal.  LEFT VENTRICLE PLAX 2D LVIDd:         5.00 cm   Diastology LVIDs:         3.10 cm   LV e' medial:    8.81 cm/s LV PW:         0.90 cm   LV E/e' medial:  7.6 LV IVS:        0.90 cm   LV e' lateral:   8.05 cm/s LVOT diam:     2.00 cm   LV E/e' lateral: 8.3 LV SV:         61 LV SV Index:   25 LVOT Area:     3.14 cm  RIGHT VENTRICLE RV S prime:     17.10 cm/s TAPSE (M-mode): 2.6 cm LEFT ATRIUM             Index        RIGHT ATRIUM           Index LA Vol (A2C):   35.4 ml 14.46 ml/m  RA Area:     13.70 cm LA Vol (A4C):   41.4 ml 16.91 ml/m  RA Volume:   32.00 ml  13.07 ml/m LA Biplane Vol: 39.9 ml 16.30 ml/m  AORTIC VALVE AV Area (Vmax):    2.83 cm AV Area (Vmean):   2.47 cm AV Area (VTI):     2.77 cm AV Vmax:           110.00 cm/s AV Vmean:          83.400 cm/s AV VTI:            0.219 m AV Peak Grad:      4.8 mmHg AV Mean Grad:      3.0 mmHg LVOT Vmax:         99.00 cm/s LVOT Vmean:        65.700 cm/s LVOT VTI:          0.193 m LVOT/AV VTI ratio: 0.88  AORTA Ao Root diam: 2.90 cm MITRAL VALVE MV Area (PHT): 2.93 cm    SHUNTS MV Decel Time: 259 msec    Systemic VTI:  0.19 m MV E velocity: 67.10 cm/s  Systemic Diam: 2.00 cm MV A velocity: 61.30 cm/s MV E/A ratio:  1.09 Salena Negri MD Electronically signed by Salena Negri MD Signature Date/Time: 03/11/2024/10:44:04 AM    Final    CT Maxillofacial Wo Contrast Result Date: 03/10/2024 CLINICAL DATA:  Facial trauma, blunt.  Multiple recent falls. Partially visualized right maxillary sinus fracture on head CT today. EXAM: CT MAXILLOFACIAL WITHOUT CONTRAST TECHNIQUE: Multidetector CT imaging of the maxillofacial structures was performed. Multiplanar CT image reconstructions were also generated. RADIATION DOSE REDUCTION: This exam was performed according to the departmental dose-optimization program which includes automated exposure control, adjustment of the mA and/or kV according to patient size and/or use of iterative reconstruction technique. COMPARISON:  Head CT earlier today.  Maxillofacial CT 05/19/2021. FINDINGS: Osseous: Acute, mildly displaced fractures of the anterior and posterior/lateral walls of the right maxillary sinus with a small amount gas in the adjacent soft tissues. Acute nondisplaced fractures of the inferior right orbital rim and medial wall the right orbit. Unchanged mildly depressed fracture of the right orbital floor more posteriorly. No mandibular dislocation or destructive process. Orbits: Mild right periorbital soft tissue swelling. No retrobulbar hematoma. Grossly intact globes. Sinuses: Right maxillary hemosinus. Clear mastoid air cells and middle ear cavities. Soft tissues: Right facial contusion. Limited intracranial: More  fully evaluated on today's earlier head CT. IMPRESSION: 1. Acute mildly displaced fractures of the right maxillary sinus. 2. Acute nondisplaced fractures of the inferior rim and medial wall of the right orbit. Electronically Signed   By: Dasie Hamburg M.D.   On: 03/10/2024 17:08   CT CHEST ABDOMEN PELVIS W CONTRAST Result Date: 03/10/2024 CLINICAL DATA:  Polytrauma. EXAM: CT CHEST, ABDOMEN, AND PELVIS WITH CONTRAST TECHNIQUE: Multidetector CT imaging of the chest, abdomen and pelvis was performed following the standard protocol during bolus administration of intravenous contrast. RADIATION DOSE REDUCTION: This exam was performed according to the departmental dose-optimization program  which includes automated exposure control, adjustment of the mA and/or kV according to patient size and/or use of iterative reconstruction technique. CONTRAST:  100mL OMNIPAQUE IOHEXOL 300 MG/ML  SOLN COMPARISON:  None Available. FINDINGS: CT CHEST FINDINGS Cardiovascular: There is no cardiomegaly. Small pericardial effusion measuring 12 mm anterior to the heart. Mild atherosclerotic calcification of the thoracic aorta. No intimal dilatation or dissection. The origins of the great vessels of the aortic arch appear patent. Bilateral lobar and segmental pulmonary artery emboli. The RV/LV ratio is approximately 1 indicative of right heart straining. Mediastinum/Nodes: No hilar or mediastinal adenopathy. The esophagus and the thyroid gland are grossly unremarkable. No mediastinal fluid collection. Lungs/Pleura: No focal consolidation, pleural effusion, pneumothorax. The central airways are patent. Musculoskeletal: Old healed left posterior rib fractures. Fractures of the anterior right fifth and sixth ribs, likely subacute or old. Correlation with clinical exam and point tenderness recommended. There is osteopenia with degenerative changes of the spine. No definite acute osseous pathology. CT ABDOMEN PELVIS FINDINGS No intra-abdominal free air or free fluid. Hepatobiliary: Small liver cyst. No medial tension. The gallbladder is unremarkable. Pancreas: Unremarkable. No pancreatic ductal dilatation or surrounding inflammatory changes. Spleen: Normal in size without focal abnormality. Adrenals/Urinary Tract: The adrenal glands unremarkable. There is no hydronephrosis on either side. Small exophytic left renal cyst. There is symmetric enhancement and excretion of contrast by both kidneys. The visualized ureters and urinary bladder appear unremarkable. Stomach/Bowel: There is no bowel obstruction or active inflammation. The appendix is normal. Vascular/Lymphatic: The abdominal aorta and IVC unremarkable. No portal venous  gas. There is no adenopathy. Reproductive: The prostate and seminal vesicles are grossly remarkable. No pelvic mass. Other: Small fat containing umbilical and right inguinal hernia. Musculoskeletal: Osteopenia with degenerative changes of the spine. There is nondisplaced fracture through the posterior elements of S3 (89/6 and 22/5). IMPRESSION: 1. Bilateral lobar and segmental pulmonary artery emboli with right heart straining. Correlation with EKG and troponin levels recommended. 2. Nondisplaced fracture through the posterior elements of S3. 3. Fractures of the anterior right fifth and sixth ribs, likely subacute or old. Correlation with clinical exam and point tenderness recommended. 4. No bowel obstruction. Normal appendix. 5.  Aortic Atherosclerosis (ICD10-I70.0). These results were called by telephone at the time of interpretation on 03/10/2024 at 3:27 pm to provider [facility] , who verbally acknowledged these results. Electronically Signed   By: Vanetta Chou M.D.   On: 03/10/2024 15:29   CT Head Wo Contrast Result Date: 03/10/2024 CLINICAL DATA:  Polytrauma, blunt falls, parkinsons; Polytrauma, blunt. Multiple falls in the past 2 days. Confusion. History of Parkinson's disease. EXAM: CT HEAD WITHOUT CONTRAST CT CERVICAL SPINE WITHOUT CONTRAST TECHNIQUE: Multidetector CT imaging of the head and cervical spine was performed following the standard protocol without intravenous contrast. Multiplanar CT image reconstructions of the cervical spine were also generated. RADIATION DOSE REDUCTION: This exam was performed  according to the departmental dose-optimization program which includes automated exposure control, adjustment of the mA and/or kV according to patient size and/or use of iterative reconstruction technique. COMPARISON:  CT head 05/19/2021 FINDINGS: CT HEAD FINDINGS Brain: There is no evidence of an acute infarct, intracranial hemorrhage, mass, midline shift, or extra-axial fluid  collection. There is mild cerebral atrophy. Mild chronic small vessel ischemia is again noted with an unchanged chronic lacunar infarct at the anterior aspect of the right basal ganglia. Vascular: No hyperdense vessel. Skull: No acute calvarial fracture or suspicious lesion. Sinuses/Orbits: Partially visualized minimally displaced fracture of the posterolateral wall of the right maxillary sinus with regional soft tissue gas and hemorrhage layering in the sinus. Old right orbital floor fracture. Clear mastoid air cells. Other: Partially visualized right facial soft tissue swelling. CT CERVICAL SPINE FINDINGS Alignment: Cervical spine straightening. Trace anterolisthesis of C4 on C5. Slight left convex curvature of the cervical spine. Skull base and vertebrae: No acute fracture or suspicious lesion. Soft tissues and spinal canal: No prevertebral fluid or swelling. No visible canal hematoma. Disc levels: Diffuse cervical spondylosis including prominent anterior vertebral osteophytes from C3-C7. 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 Moderate multilevel facet arthrosis. Upper chest: Clear lung apices. Other: None. IMPRESSION: 1. No evidence of acute intracranial abnormality or cervical spine fracture. 2. Partially visualized acute right maxillary sinus fracture. A maxillofacial CT is recommended to allow full characterization and assessment for any additional facial fractures. Electronically Signed   By: Dasie Hamburg M.D.   On: 03/10/2024 15:17   CT Cervical Spine Wo Contrast Result Date: 03/10/2024 CLINICAL DATA:  Polytrauma, blunt falls, parkinsons; Polytrauma, blunt. Multiple falls in the past 2 days. Confusion. History of Parkinson's disease. EXAM: CT HEAD WITHOUT CONTRAST CT CERVICAL SPINE WITHOUT CONTRAST TECHNIQUE: Multidetector CT imaging of the head  and cervical spine was performed following the standard protocol without intravenous contrast. Multiplanar CT image reconstructions of the cervical spine were also generated. RADIATION DOSE REDUCTION: This exam was performed according to the departmental dose-optimization program which includes automated exposure control, adjustment of the mA and/or kV according to patient size and/or use of iterative reconstruction technique. COMPARISON:  CT head 05/19/2021 FINDINGS: CT HEAD FINDINGS Brain: There is no evidence of an acute infarct, intracranial hemorrhage, mass, midline shift, or extra-axial fluid collection. There is mild cerebral atrophy. Mild chronic small vessel ischemia is again noted with an unchanged chronic lacunar infarct at the anterior aspect of the right basal ganglia. Vascular: No hyperdense vessel. Skull: No acute calvarial fracture or suspicious lesion. Sinuses/Orbits: Partially visualized minimally displaced fracture of the posterolateral wall of the right maxillary sinus with regional soft tissue gas and hemorrhage layering in the sinus. Old right orbital floor fracture. Clear mastoid air cells. Other: Partially visualized right facial soft tissue swelling. CT CERVICAL SPINE FINDINGS Alignment: Cervical spine straightening. Trace anterolisthesis of C4 on C5. Slight left convex curvature of the cervical spine. Skull base and vertebrae: No acute fracture or suspicious lesion. Soft tissues and spinal canal: No prevertebral fluid or swelling. No visible canal hematoma. Disc levels: Diffuse cervical spondylosis including prominent anterior vertebral osteophytes from C3-C7. 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 Moderate multilevel facet arthrosis. Upper chest: Clear lung apices. Other: None. IMPRESSION: 1.  No evidence of acute intracranial  abnormality or cervical spine fracture. 2. Partially visualized acute right maxillary sinus fracture. A maxillofacial CT is recommended to allow full characterization and assessment for any additional facial fractures. Electronically Signed   By: Dasie Hamburg M.D.   On: 03/10/2024 15:17   DG Shoulder Right Result Date: 03/10/2024 CLINICAL DATA:  Fall EXAM: RIGHT SHOULDER - 2+ VIEW COMPARISON:  None Available. FINDINGS: Advanced osteoarthritis in the right [facility] joint with joint space narrowing and spurring. Glenohumeral joint is slightly narrowed otherwise intact. No acute bony abnormality. Specifically, no fracture, subluxation, or dislocation. 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 No acute bony abnormality. Electronically Signed   By: Franky Crease M.D.   On: 03/10/2024 15:10    Scheduled Meds:    carbidopa -levodopa   1 tablet Oral 6 X Daily   pantoprazole  40 mg Oral Daily   potassium chloride  40 mEq Oral Q4H    Continuous Infusions:    heparin 1,700 Units/hr (03/11/24 0343)   lactated ringers 75 mL/hr at 03/11/24 0338     LOS: 1 day     Trenda Mar, MD,  FACP, [facility], [facility], [facility]   [facility] Ad[facility] [place]      To contact the attending provider between 7A-7P or the covering provider during after hours 7P-7A, please log into the web site [email or web address] and access using universal [place] password for that web site. If you do not have the password, please call the hospital operator.  03/11/2024, 10:52 AM

## 2024-03-11 NOTE — Progress Notes (Signed)
 OT Cancellation Note  Patient Details Name: RASHEEN BELLS MRN: [id number] DOB: 26-Jul-1961   Cancelled Treatment:    Reason Eval/Treat Not Completed: Medical issues which prohibited therapy. Per acute rehab protocol, will hold therapy eval until 24 hours post initial dose of Heparin (6/26 at 1:22am).    Rusell Meneely C, OT  Acute Rehabilitation Services Office [phone] Secure chat preferred   Adrianne GORMAN Savers 03/11/2024, 9:28 AM

## 2024-03-11 NOTE — Progress Notes (Signed)
 BLE venous duplex has been completed.   Results can be found under chart review under CV PROC. 03/11/2024 1:07 PM Silvie Obremski RVT, RDMS

## 2024-03-11 NOTE — Plan of Care (Signed)

## 2024-03-11 NOTE — Progress Notes (Signed)
 ANTICOAGULATION CONSULT NOTE  Pharmacy Consult for Heparin Indication: pulmonary embolus  No Known Allergies  Patient Measurements: Height: 6' 2 (188 cm) Weight: 120 kg (264 lb 8.8 oz) IBW/kg (Calculated) : 82.2 Heparin Dosing Weight: 107.9kg  Vital Signs: Temp: 98.2 F (36.8 C) (06/26 1926) Temp Source: Oral (06/26 1926) BP: 140/80 (06/26 1926) Pulse Rate: 82 (06/26 1926)  Labs: Recent Labs    03/10/24 1330 03/10/24 1916 03/10/24 2347 03/11/24 0457 03/11/24 1054 03/11/24 1756  HGB 11.2*  --   --  10.2*  --   --   HCT 33.2*  --   --  31.3*  --   --   PLT 250  --   --  217  --   --   HEPARINUNFRC  --   --  0.80*  --  0.81* 0.89*  CREATININE 1.99*  --   --  1.55*  --   --   TROPONINIHS  --  50*  --  40*  --   --     Estimated Creatinine Clearance: 67.1 mL/min (A) (by C-G formula based on SCr of 1.55 mg/dL (H)).   Medical History: Past Medical History:  Diagnosis Date   Hypertension    Parkinson's disease (HCC)    Stroke [facility])       Assessment: 36 yom with a history of Parkinson's. Patient is presenting with 4 falls in the past 2 days. Found to have BL segmental PE with RHS. No anticoagulation prior to admission. Pharmacy consulted for heparin.    Heparin level 0.89 is supratherapeutic on 1550 units/hr.  No issues with infusion or bleeding per RN. Heparin is running R arm, level draw from L hand.  Goal of Therapy:  Heparin level 0.3-0.7 units/ml Monitor platelets by anticoagulation protocol: Yes   Plan:  Decrease Heparin infusion 1350 units/hr F/u 8hr heparin level with AM labs Monitor daily heparin level, CBC, signs/symptoms of bleeding  F/u switch to apixaban   Rocky Slade, PharmD, BCPS Clinical Pharmacist 03/11/2024 8:20 PM  Please check AMION for all [facility] Pharmacy phone numbers After 10:00 PM, call Main Pharmacy [phone]

## 2024-03-11 NOTE — Progress Notes (Signed)
 PT Cancellation Note  Patient Details Name: John [name] MRN: [id number] DOB: September 26, 1960   Cancelled Treatment:    Reason Eval/Treat Not Completed: (P) Medical issues which prohibited therapy Pt with acute PE. Started on heparin 0121. Per Rehab protocol pt must be on heparin 24 hours before working with therapy.   Tamala Manzer B. Fleeta Lapidus PT, DPT Acute Rehabilitation Services Please use secure chat or  Call Office [phone]    Almarie KATHEE Fleeta [facility] 03/11/2024, 10:18 AM

## 2024-03-12 DIAGNOSIS — N179 Acute kidney failure, unspecified: Secondary | ICD-10-CM | POA: Diagnosis not present

## 2024-03-12 DIAGNOSIS — E876 Hypokalemia: Secondary | ICD-10-CM | POA: Diagnosis not present

## 2024-03-12 DIAGNOSIS — I2699 Other pulmonary embolism without acute cor pulmonale: Secondary | ICD-10-CM | POA: Diagnosis not present

## 2024-03-12 LAB — COMPREHENSIVE METABOLIC PANEL WITH GFR
ALT: <5 U/L (ref 0–44)
AST: 22 U/L (ref 15–41)
Albumin: 3 g/dL — ABNORMAL LOW (ref 3.5–5.0)
Alkaline Phosphatase: 75 U/L (ref 38–126)
Anion gap: 12 (ref 5–15)
BUN: 18 mg/dL (ref 8–23)
CO2: 20 mmol/L — ABNORMAL LOW (ref 22–32)
Calcium: 8.6 mg/dL — ABNORMAL LOW (ref 8.9–10.3)
Chloride: 103 mmol/L (ref 98–111)
Creatinine, Ser: 1.17 mg/dL (ref 0.61–1.24)
GFR, Estimated: >60 mL/min (ref >60)
Glucose, Bld: 90 mg/dL (ref 70–99)
Potassium: 3.1 mmol/L — ABNORMAL LOW (ref 3.5–5.1)
Sodium: 135 mmol/L (ref 135–145)
Total Bilirubin: 0.8 mg/dL (ref 0.0–1.2)
Total Protein: 5.8 g/dL — ABNORMAL LOW (ref 6.5–8.1)

## 2024-03-12 LAB — CBC
HCT: 30 % — ABNORMAL LOW (ref 39.0–52.0)
Hemoglobin: 9.7 g/dL — ABNORMAL LOW (ref 13.0–17.0)
MCH: 27.3 pg (ref 26.0–34.0)
MCHC: 32.3 g/dL (ref 30.0–36.0)
MCV: 84.5 fL (ref 80.0–100.0)
Platelets: 199 10*3/uL (ref 150–400)
RBC: 3.55 MIL/uL — ABNORMAL LOW (ref 4.22–5.81)
RDW: 14.1 % (ref 11.5–15.5)
WBC: 5.1 10*3/uL (ref 4.0–10.5)
nRBC: 0 % (ref 0.0–0.2)

## 2024-03-12 LAB — HEPARIN LEVEL (UNFRACTIONATED): Heparin Unfractionated: 0.63 [IU]/mL (ref 0.30–0.70)

## 2024-03-12 LAB — MAGNESIUM: Magnesium: 2 mg/dL (ref 1.7–2.4)

## 2024-03-12 MED ORDER — APIXABAN 5 MG PO TABS
10.0000 mg | ORAL_TABLET | Freq: Two times a day (BID) | ORAL | Status: DC
  Administered 2024-03-12 – 2024-03-16 (×9): 10 mg via ORAL
  Filled 2024-03-12: qty 2
  Filled 2024-03-12: qty 4
  Filled 2024-03-12 (×7): qty 2

## 2024-03-12 MED ORDER — APIXABAN 5 MG PO TABS: 5.0000 mg | ORAL_TABLET | Freq: Two times a day (BID) | ORAL | Status: DC

## 2024-03-12 MED ORDER — POTASSIUM CHLORIDE CRYS ER 20 MEQ PO TBCR
40.0000 meq | EXTENDED_RELEASE_TABLET | ORAL | Status: AC
  Administered 2024-03-12 (×2): 40 meq via ORAL
  Filled 2024-03-12 (×2): qty 2

## 2024-03-12 NOTE — Progress Notes (Signed)
 ANTICOAGULATION CONSULT NOTE  Pharmacy Consult for Heparin  Indication: pulmonary embolus  No Known Allergies  Patient Measurements: Height: 6' 2 (188 cm) Weight: 120 kg (264 lb 8.8 oz) IBW/kg (Calculated) : 82.2 Heparin  Dosing Weight: 107.9kg  Vital Signs: Temp: 98.4 F (36.9 C) (06/27 0428) Temp Source: Oral (06/27 0428) BP: 117/66 (06/27 0428) Pulse Rate: 65 (06/27 0428)  Labs: Recent Labs    03/10/24 1330 03/10/24 1916 03/10/24 2347 03/11/24 0457 03/11/24 1054 03/11/24 1756 03/12/24 0452  HGB 11.2*  --   --  10.2*  --   --  9.7*  HCT 33.2*  --   --  31.3*  --   --  30.0*  PLT 250  --   --  217  --   --  199  HEPARINUNFRC  --   --    < >  --  0.81* 0.89* 0.63  CREATININE 1.99*  --   --  1.55*  --   --  1.17  TROPONINIHS  --  50*  --  40*  --   --   --    < > = values in this interval not displayed.    Estimated Creatinine Clearance: 88.9 mL/min (by C-G formula based on SCr of 1.17 mg/dL).   Medical History: Past Medical History:  Diagnosis Date   Hypertension    Parkinson's disease (HCC)    Stroke [facility])       Assessment: 74 yom with a history of Parkinson's. Patient is presenting with 4 falls in the past 2 days. Found to have BL segmental PE with RHS. No anticoagulation prior to admission. Pharmacy consulted for heparin .    Heparin  level 0.89 is supratherapeutic on 1550 units/hr.  No issues with infusion or bleeding per RN. Heparin  is running R arm, level draw from L hand.  6/27 AM update:  Heparin  level therapeutic after rate decrease  Goal of Therapy:  Heparin  level 0.3-0.7 units/ml Monitor platelets by anticoagulation protocol: Yes   Plan:  Cont heparin  1350 units/hr Heparin  level in 8 hours  Lynwood Mckusick, PharmD, BCPS Clinical Pharmacist Phone: [phone]

## 2024-03-12 NOTE — Evaluation (Signed)
 Physical Therapy Evaluation Patient Details Name: John [name] MRN: [id number] DOB: 1960-12-11 Today's Date: 03/12/2024  History of Present Illness  Pt is a 63 y.o. male admitted 6/25 for frequent falls and AMS. CT showed pulmonary embolism, fxs to the R maxillary sinus & orbit, ribs 5-6 fxs.  PMH: parkinson's, HTN, CVA  Clinical Impression  PTA pt was living with 2 roommates in single story home with 2 steps to enter. Pt reports roommates can not provide physical assist but are there 24/7. Pt reports independence in mobility with SPC, driving and independent in ADLs and iADLs. Pt is currently limited in safe mobility by decreased R eye peripheral vision, generalized weakness, and associated decreased balance and endurance. Pt is modA for bed mobility, min A for transfers and modA for ambulation with RW for navigation in hallway due to decreased visual field. Patient will benefit from continued inpatient follow up therapy, <3 hours/day at this assessment. PT will follow back for stair training and will reassess safety with mobility.          If plan is discharge home, recommend the following: A little help with walking and/or transfers;A little help with bathing/dressing/bathroom;Assistance with cooking/housework;Direct supervision/assist for medications management;Assist for transportation;Help with stairs or ramp for entrance   Can travel by private vehicle   Yes    Equipment Recommendations Rolling walker (2 wheels)     Functional Status Assessment Patient has had a recent decline in their functional status and demonstrates the ability to make significant improvements in function in a reasonable and predictable amount of time.     Precautions / Restrictions Precautions Precautions: Fall Recall of Precautions/Restrictions: Intact Restrictions Weight Bearing Restrictions Per Provider Order: No      Mobility  Bed Mobility Overal bed mobility: Needs Assistance Bed Mobility:  Supine to Sit     Supine to sit: HOB elevated, Used rails, Mod assist     General bed mobility comments: mod A for pt to pull up against PT to come to EoB    Transfers Overall transfer level: Needs assistance Equipment used: Rolling walker (2 wheels) Transfers: Sit to/from Stand, Bed to chair/wheelchair/BSC Sit to Stand: Min assist   Step pivot transfers: Contact guard assist       General transfer comment: Difficulty problem solving hand placement for STS, initally min assist to steady with posterior lean, CGA once in standing    Ambulation/Gait Ambulation/Gait assistance: Mod assist Gait Distance (Feet): 35 Feet Assistive device: Rolling walker (2 wheels) Gait Pattern/deviations: Step-through pattern, Shuffle, Trunk flexed, Decreased step length - right, Decreased step length - left Gait velocity: slowed Gait velocity interpretation: <1.31 ft/sec, indicative of household ambulator   General Gait Details: pt with impaired R eye peripheral vision, requiring modA for navigation in hallway, pt with decreased awareness of deficits,     Balance Overall balance assessment: Needs assistance Sitting-balance support: No upper extremity supported, Feet supported Sitting balance-Leahy Scale: Fair   Postural control: Posterior lean Standing balance support: Bilateral upper extremity supported, During functional activity, Reliant on assistive device for balance Standing balance-Leahy Scale: Poor Standing balance comment: Reliant on RW                             Pertinent Vitals/Pain Pain Assessment Pain Assessment: Faces Faces Pain Scale: Hurts a little bit Pain Location: R shoulder with mobility, and R eye Pain Descriptors / Indicators: Discomfort    Home Living Family/patient expects to be  discharged to:: Private residence Living Arrangements: Non-relatives/Friends Available Help at Discharge: Family;Available PRN/intermittently Type of Home: House Home  Access: Stairs to enter Entrance Stairs-Rails: None Entrance Stairs-Number of Steps: 2   Home Layout: One level Home Equipment: Agricultural consultant (2 wheels);Cane - quad;BSC/3in1      Prior Function Prior Level of Function : Independent/Modified Independent             Mobility Comments: Primarily using cane, reports 1 fall ADLs Comments: No assist, 1 fall in shower     Extremity/Trunk Assessment   Upper Extremity Assessment Upper Extremity Assessment: Defer to OT evaluation RUE Deficits / Details: R shoulder pain with AROM, overall WFL RUE: Shoulder pain with ROM    Lower Extremity Assessment Lower Extremity Assessment: Generalized weakness    Cervical / Trunk Assessment Cervical / Trunk Assessment: Kyphotic  Communication   Communication Communication: No apparent difficulties    Cognition Arousal: Alert Behavior During Therapy: WFL for tasks assessed/performed   PT - Cognitive impairments: Safety/Judgement, Problem solving, Awareness                       PT - Cognition Comments: pt with poor understanding of situation, and safety awareness with ambulation in hallway, bumping into things due to impaired eyesight Following commands: Intact Following commands impaired: Follows one step commands with increased time, Follows multi-step commands with increased time     Cueing Cueing Techniques: Verbal cues     General Comments General comments (skin integrity, edema, etc.): supine BP 118/74, sitting BP 113/76, standing BP 104/76 after ambulatioin 108/66,        Assessment/Plan    PT Assessment Patient needs continued PT services  PT Problem List Decreased strength;Decreased activity tolerance;Decreased balance;Decreased mobility;Decreased cognition;Decreased coordination;Decreased knowledge of use of DME;Decreased safety awareness;Cardiopulmonary status limiting activity;Pain       PT Treatment Interventions DME instruction;Gait training;Functional  mobility training;Therapeutic activities;Therapeutic exercise;Balance training;Cognitive remediation;Patient/family education    PT Goals (Current goals can be found in the Care Plan section)  Acute Rehab PT Goals PT Goal Formulation: With patient Time For Goal Achievement: 03/26/24 Potential to Achieve Goals: Good    Frequency Min 3X/week        AM-PAC PT 6 Clicks Mobility  Outcome Measure Help needed turning from your back to your side while in a flat bed without using bedrails?: A Little Help needed moving from lying on your back to sitting on the side of a flat bed without using bedrails?: A Lot Help needed moving to and from a bed to a chair (including a wheelchair)?: A Lot Help needed standing up from a chair using your arms (e.g., wheelchair or bedside chair)?: A Little Help needed to walk in hospital room?: A Lot Help needed climbing 3-5 steps with a railing? : Total 6 Click Score: 13    End of Session Equipment Utilized During Treatment: Gait belt Activity Tolerance: Patient limited by fatigue Patient left: Other (comment) (with NT at sink) Nurse Communication: Mobility status PT Visit Diagnosis: Unsteadiness on feet (R26.81);Other abnormalities of gait and mobility (R26.89);History of falling (Z91.81);Muscle weakness (generalized) (M62.81);Difficulty in walking, not elsewhere classified (R26.2);Pain Pain - Right/Left: Right Pain - part of body:  (eye)    Time: 9093-9059 PT Time Calculation (min) (ACUTE ONLY): 34 min   Charges:   PT Evaluation $PT Eval Moderate Complexity: 1 Mod PT Treatments $Gait Training: 8-22 mins PT General Charges $$ ACUTE PT VISIT: 1 Visit  Deven Furia B. Fleeta Lapidus PT, DPT Acute Rehabilitation Services Please use secure chat or  Call Office [phone]   Almarie KATHEE Fleeta Fleet 03/12/2024, 10:00 AM

## 2024-03-12 NOTE — Progress Notes (Signed)
   03/12/24 1815  Spiritual Encounters  Type of Visit Initial  Care provided to: Pt and family  Referral source Nurse (RN/NT/LPN)  Reason for visit Advance directives  OnCall Visit Yes  Interventions  Spiritual Care Interventions Made Established relationship of care and support;Compassionate presence;Decision-making support/facilitation  Intervention Outcomes  Outcomes Connection to spiritual care;Awareness around self/spiritual resourses;Connection to values and goals of care;Patient family open to resources  Advance Directives (For Healthcare)  Does Patient Have a Medical Advance Directive? No  Would patient like information on creating a medical advance directive? Yes (Inpatient - patient defers creating a medical advance directive at this time - Information given)    Chaplain responded to unit page requesting AD information for pt and a phone call to pt's sister. Chaplain provided paperwork and information, and called [name] to update her. Chaplain informed both that notarization is available Mon-Fri business hours. Pt indicates he would like to name either his adult son or [name] as [place]. Chaplains remain available.

## 2024-03-12 NOTE — Progress Notes (Signed)
 ANTICOAGULATION CONSULT NOTE  Pharmacy Consult for Heparin  > apixaban Indication: pulmonary embolus  No Known Allergies  Patient Measurements: Height: 6' 2 (188 cm) Weight: 120 kg (264 lb 8.8 oz) IBW/kg (Calculated) : 82.2 Heparin  Dosing Weight: 107.9kg  Vital Signs: Temp: 98.4 F (36.9 C) (06/27 0728) Temp Source: Oral (06/27 0728) BP: 116/70 (06/27 0728) Pulse Rate: 66 (06/27 0728)  Labs: Recent Labs    03/10/24 1330 03/10/24 1916 03/10/24 2347 03/11/24 0457 03/11/24 1054 03/11/24 1756 03/12/24 0452  HGB 11.2*  --   --  10.2*  --   --  9.7*  HCT 33.2*  --   --  31.3*  --   --  30.0*  PLT 250  --   --  217  --   --  199  HEPARINUNFRC  --   --    < >  --  0.81* 0.89* 0.63  CREATININE 1.99*  --   --  1.55*  --   --  1.17  TROPONINIHS  --  50*  --  40*  --   --   --    < > = values in this interval not displayed.    Estimated Creatinine Clearance: 88.9 mL/min (by C-G formula based on SCr of 1.17 mg/dL).   Medical History: Past Medical History:  Diagnosis Date   Hypertension    Parkinson's disease (HCC)    Stroke [facility])       Assessment: 59 yom with a history of Parkinson's. Patient is presenting with 4 falls in the past 2 days. Found to have BL segmental PE with RHS. No anticoagulation prior to admission. Pharmacy consulted for heparin .    Heparin  level 0.89 is supratherapeutic on 1550 units/hr.  No issues with infusion or bleeding per RN. Heparin  is running R arm, level draw from L hand.  6/27 AM update:  Heparin  level therapeutic after rate decrease  6/27 heparin  drip > apixaban 10mg  BID x7days then 5mg  BID there after   Goal of Therapy:  Heparin  level 0.3-0.7 units/ml Monitor platelets by anticoagulation protocol: Yes   Plan:  Stop heparin   Begin apixaban today  10mg  BID x7days then 5mg  BID   Olam Chalk Pharm.D. CPP, BCPS Clinical Pharmacist [phone] 03/12/2024 11:09 AM

## 2024-03-12 NOTE — Discharge Instructions (Signed)
 Information on my medicine - ELIQUIS (apixaban)  This medication education was reviewed with me or my healthcare representative as part of my discharge preparation.   Why was Eliquis prescribed for you? Eliquis was prescribed to treat blood clots that may have been found in the veins of your legs (deep vein thrombosis) or in your lungs (pulmonary embolism) and to reduce the risk of them occurring again.  What do You need to know about Eliquis ? The starting dose is 10 mg (two 5 mg tablets) taken TWICE daily for the FIRST SEVEN (7) DAYS, then on 03/19/2024  the dose is reduced to ONE 5 mg tablet taken TWICE daily.  Eliquis may be taken with or without food.   Try to take the dose about the same time in the morning and in the evening. If you have difficulty swallowing the tablet whole please discuss with your pharmacist how to take the medication safely.  Take Eliquis exactly as prescribed and DO NOT stop taking Eliquis without talking to the doctor who prescribed the medication.  Stopping may increase your risk of developing a new blood clot.  Refill your prescription before you run out.  After discharge, you should have regular check-up appointments with your healthcare provider that is prescribing your Eliquis.    What do you do if you miss a dose? If a dose of ELIQUIS is not taken at the scheduled time, take it as soon as possible on the same day and twice-daily administration should be resumed. The dose should not be doubled to make up for a missed dose.  Important Safety Information A possible side effect of Eliquis is bleeding. You should call your healthcare provider right away if you experience any of the following: Bleeding from an injury or your nose that does not stop. Unusual colored urine (red or dark brown) or unusual colored stools (red or black). Unusual bruising for unknown reasons. A serious fall or if you hit your head (even if there is no bleeding).  Some medicines  may interact with Eliquis and might increase your risk of bleeding or clotting while on Eliquis. To help avoid this, consult your healthcare provider or pharmacist prior to using any new prescription or non-prescription medications, including herbals, vitamins, non-steroidal anti-inflammatory drugs (NSAIDs) and supplements.  This website has more information on Eliquis (apixaban): http://www.eliquis.com/eliquis/home    ============================================  Pulmonary Embolism    A pulmonary embolism (PE) is a sudden blockage or decrease of blood flow in one or both lungs. Most blockages come from a blood clot that forms in the vein of a lower leg, thigh, or arm (deep vein thrombosis, DVT) and travels to the lungs. A clot is blood that has thickened into a gel or solid. PE is a dangerous and life-threatening condition that needs to be treated right away.  What are the causes? This condition is usually caused by a blood clot that forms in a vein and moves to the lungs. In rare cases, it may be caused by air, fat, part of a tumor, or other tissue that moves through the veins and into the lungs.  What increases the risk? The following factors may make you more likely to develop this condition: Experiencing a traumatic injury, such as breaking a hip or leg. Having: A spinal cord injury. Orthopedic surgery, especially hip or knee replacement. Any major surgery. A stroke. DVT. Blood clots or blood clotting disease. Long-term (chronic) lung or heart disease. Cancer treated with chemotherapy. A central venous catheter.  Taking medicines that contain estrogen. These include birth control pills and hormone replacement therapy. Being: Pregnant. In the period of time after your baby is delivered (postpartum). Older than age 11. Overweight. A smoker, especially if you have other risks.  What are the signs or symptoms? Symptoms of this condition usually start suddenly and  include: Shortness of breath during activity or at rest. Coughing, coughing up blood, or coughing up blood-tinged mucus. Chest pain that is often worse with deep breaths. Rapid or irregular heartbeat. Feeling light-headed or dizzy. Fainting. Feeling anxious. Fever. Sweating. Pain and swelling in a leg. This is a symptom of DVT, which can lead to PE. How is this diagnosed? This condition may be diagnosed based on: Your medical history. A physical exam. Blood tests. CT pulmonary angiogram. This test checks blood flow in and around your lungs. Ventilation-perfusion scan, also called a lung VQ scan. This test measures air flow and blood flow to the lungs. An ultrasound of the legs.  How is this treated? Treatment for this condition depends on many factors, such as the cause of your PE, your risk for bleeding or developing more clots, and other medical conditions you have. Treatment aims to remove, dissolve, or stop blood clots from forming or growing larger. Treatment may include: Medicines, such as: Blood thinning medicines (anticoagulants) to stop clots from forming. Medicines that dissolve clots (thrombolytics). Procedures, such as: Using a flexible tube to remove a blood clot (embolectomy) or to deliver medicine to destroy it (catheter-directed thrombolysis). Inserting a filter into a large vein that carries blood to the heart (inferior vena cava). This filter (vena cava filter) catches blood clots before they reach the lungs. Surgery to remove the clot (surgical embolectomy). This is rare. You may need a combination of immediate, long-term (up to 3 months after diagnosis), and extended (more than 3 months after diagnosis) treatments. Your treatment may continue for several months (maintenance therapy). You and your health care provider will work together to choose the treatment program that is best for you.  Follow these instructions at home: Medicines Take over-the-counter and  prescription medicines only as told by your health care provider. If you are taking an anticoagulant medicine: Take the medicine every day at the same time each day. Understand what foods and drugs interact with your medicine. Understand the side effects of this medicine, including excessive bruising or bleeding. Ask your health care provider or pharmacist about other side effects.  General instructions Wear a medical alert bracelet or carry a medical alert card that says you have had a PE and lists what medicines you take. Ask your health care provider when you may return to your normal activities. Avoid sitting or lying for a long time without moving. Maintain a healthy weight. Ask your health care provider what weight is healthy for you. Do not use any products that contain nicotine or tobacco, such as cigarettes, e-cigarettes, and chewing tobacco. If you need help quitting, ask your health care provider. Talk with your health care provider about any travel plans. It is important to make sure that you are still able to take your medicine while on trips. Keep all follow-up visits as told by your health care provider. This is important.  Contact a health care provider if: You missed a dose of your blood thinner medicine.  Get help right away if: You have: New or increased pain, swelling, warmth, or redness in an arm or leg. Numbness or tingling in an arm or leg. Shortness  of breath during activity or at rest. A fever. Chest pain. A rapid or irregular heartbeat. A severe headache. Vision changes. A serious fall or accident, or you hit your head. Stomach (abdominal) pain. Blood in your vomit, stool, or urine. A cut that will not stop bleeding. You cough up blood. You feel light-headed or dizzy. You cannot move your arms or legs. You are confused or have memory loss.  These symptoms may represent a serious problem that is an emergency. Do not wait to see if the symptoms will go  away. Get medical help right away. Call your local emergency services (911 in the [place] Do not drive yourself to the hospital. Summary A pulmonary embolism (PE) is a sudden blockage or decrease of blood flow in one or both lungs. PE is a dangerous and life-threatening condition that needs to be treated right away. Treatments for this condition usually include medicines to thin your blood (anticoagulants) or medicines to break apart blood clots (thrombolytics). If you are given blood thinners, it is important to take the medicine every day at the same time each day. Understand what foods and drugs interact with any medicines that you are taking. If you have signs of PE or DVT, call your local emergency services (911 in the [place] This information is not intended to replace advice given to you by your health care provider. Make sure you discuss any questions you have with your health care provider. Document Revised: 06/10/2018 Document Reviewed: 06/10/2018 Elsevier Patient Education  2020 [employer].    Additional Discharge Instructions   Please get your medications reviewed and adjusted by your Primary MD.  Please request your Primary MD to go over all Hospital Tests and Procedure/Radiological results at the follow up, please get all Hospital records sent to your Primary MD by signing hospital release before you go home.  If you had Pneumonia of Lung problems at the Hospital: Please get a 2 view Chest X ray done in approximately 4 weeks after hospital discharge or sooner if instructed by your Primary MD.  If you have Congestive Heart Failure: Please call your Cardiologist or Primary MD anytime you have any of the following symptoms:  1) 3 pound weight gain in 24 hours or 5 pounds in 1 week  2) shortness of breath, with or without a dry hacking cough  3) swelling in the hands, feet or stomach  4) if you have to sleep on extra pillows at night in order to breathe  Follow cardiac low  salt diet and 1.5 lit/day fluid restriction.  If you have diabetes Accuchecks 4 times/day, Once in AM empty stomach and then before each meal. Log in all results and show them to your primary doctor at your next visit. If any glucose reading is under 80 or above 300 call your primary MD immediately.  If you have Seizure/Convulsions/Epilepsy: Please do not drive, operate heavy machinery, participate in activities at heights or participate in high speed sports until you have seen by Primary MD or a Neurologist and advised to do so again. Per [place]  DMV statutes, patients with seizures are not allowed to drive until they have been seizure-free for six months.  Use caution when using heavy equipment or power tools. Avoid working on ladders or at heights. Take showers instead of baths. Ensure the water temperature is not too high on the home water heater. Do not go swimming alone. Do not lock yourself in a room alone (i.e. bathroom). When caring for  infants or small children, sit down when holding, feeding, or changing them to minimize risk of injury to the child in the event you have a seizure. Maintain good sleep hygiene. Avoid alcohol.   If you had Gastrointestinal Bleeding: Please ask your Primary MD to check a complete blood count within one week of discharge or at your next visit. Your endoscopic/colonoscopic biopsies that are pending at the time of discharge, will also need to followed by your Primary MD.  Get Medicines reviewed and adjusted. Please take all your medications with you for your next visit with your Primary MD  Please request your Primary MD to go over all hospital tests and procedure/radiological results at the follow up, please ask your Primary MD to get all Hospital records sent to his/her office.  If you experience worsening of your admission symptoms, develop shortness of breath, life threatening emergency, suicidal or homicidal thoughts you must seek medical  attention immediately by calling 911 or calling your MD immediately  if symptoms less severe.  You must read complete instructions/literature along with all the possible adverse reactions/side effects for all the Medicines you take and that have been prescribed to you. Take any new Medicines after you have completely understood and accpet all the possible adverse reactions/side effects.   Do not drive or operate heavy machinery when taking Pain medications.   Do not take more than prescribed Pain, Sleep and Anxiety Medications  Special Instructions: If you have smoked or chewed Tobacco  in the last 2 yrs please stop smoking, stop any regular Alcohol  and or any Recreational drug use.  Wear Seat belts while driving.  Please note You were cared for by a hospitalist during your hospital stay. If you have any questions about your discharge medications or the care you received while you were in the hospital after you are discharged, you can call the unit and asked to speak with the hospitalist on call if the hospitalist that took care of you is not available. Once you are discharged, your primary care physician will handle any further medical issues. Please note that NO REFILLS for any discharge medications will be authorized once you are discharged, as it is imperative that you return to your primary care physician (or establish a relationship with a primary care physician if you do not have one) for your aftercare needs so that they can reassess your need for medications and monitor your lab values.  You can reach the hospitalist office at phone [phone] or fax [phone]   If you do not have a primary care physician, you can call [phone] for a physician referral.

## 2024-03-12 NOTE — TOC Initial Note (Addendum)
 Transition of Care [facility]) - Initial/Assessment Note    Patient Details  Name: John [name] MRN: [id number] Date of Birth: Nov 01, 1960  Transition [facility]) CM/SW Contact:    John [name], John [name] Phone Number: 03/12/2024, 11:29 AM  Clinical Narrative: Spoke with pt re PT recommendation for SNF. Pt from home with roommates x2 but states they are not able to provide daily assistance. Pt reports his sister is his primary support. Reviewed SNF placement process and answered questions. Pt is hesitant to agree to SNF placement but willing for SW to obtain bed offers before making final decision. Will present SNF bed offers to pt as available. Pt agreeable to SW updating his sister but number in EMR not working and pt does not recall her contact information.   UPDATE 1450: Update re dc plan provided to pt's sister John [name] [phone] at pt's request.   John [name], MSW, LCSW [phone] (coverage)                    Expected Discharge Plan: Skilled Nursing Facility Barriers to Discharge: Insurance Authorization, SNF Pending bed offer   Patient Goals and CMS Choice   CMS Medicare.gov Compare Post Acute Care list provided to:: Patient Choice offered to / list presented to : Patient      Expected Discharge Plan and Services       Living arrangements for the past 2 months: Skilled Nursing Facility                                      Prior Living Arrangements/Services Living arrangements for the past 2 months: Skilled Nursing Facility Lives with:: Roommate Patient language and need for interpreter reviewed:: Yes Do you feel safe going back to the place where you live?: Yes      Need for Family Participation in Patient Care: Yes (Comment) Care giver support system in place?: No (comment)   Criminal Activity/Legal Involvement Pertinent to Current Situation/Hospitalization: No - Comment as needed  Activities of Daily Living   ADL Screening (condition at  time of admission) Independently performs ADLs?: Yes (appropriate for developmental age) Is the patient deaf or have difficulty hearing?: No Does the patient have difficulty seeing, even when wearing glasses/contacts?: No Does the patient have difficulty concentrating, remembering, or making decisions?: No  Permission Sought/Granted Permission sought to share information with : Oceanographer granted to share information with : Yes, Verbal Permission Granted              Emotional Assessment       Orientation: : Oriented to Self, Oriented to Place, Oriented to  Time, Oriented to Situation Alcohol / Substance Use: Not Applicable Psych Involvement: No (comment)  Admission diagnosis:  Acute pulmonary embolism (HCC) [I26.99] Fall, initial encounter [W19.XXXA] Closed fracture of multiple ribs, unspecified laterality, initial encounter [S22.49XA] Closed fracture of facial bone, unspecified facial bone, initial encounter (HCC) [S02.92XA] Closed fracture of sacrum, unspecified portion of sacrum, initial encounter (HCC) [S32.10XA] Acute pulmonary embolism, unspecified pulmonary embolism type, unspecified whether acute cor pulmonale present [facility]) [I26.99] Patient Active Problem List   Diagnosis Date Noted   Acute pulmonary embolism (HCC) 03/10/2024   ARF (acute renal failure) (HCC) 03/10/2024   Hypokalemia 03/10/2024   Hyponatremia 03/10/2024   Maxillary fracture (HCC) 03/10/2024   Anemia 03/10/2024   Tinnitus of both ears 12/10/2023   Parkinson's disease with fluctuating manifestations (HCC)  12/02/2022   Routine general medical examination at a health care facility 10/29/2021   Family history of prostate cancer 10/29/2021   Essential hypertension 10/29/2021   Gastroesophageal reflux disease without esophagitis 10/29/2021   Obesity (BMI 30.0-34.9) 10/29/2021   PCP:  John [name], MD Pharmacy:   [facility] [address] [place] [name] - [address] [name] [address] Phone: [phone] Fax: [phone]  [facility] - [place] - [address] [name] [address] Phone: [phone] Fax: [phone]  [facility] [address] [name] [address] Phone: [phone] Fax: [phone]     Social Drivers of Health (SDOH) Social History: SDOH Screenings   Food Insecurity: No Food Insecurity (03/10/2024)  Housing: Low Risk  (03/10/2024)  Transportation Needs: No Transportation Needs (03/10/2024)  Utilities: Not At Risk (03/10/2024)  Depression (PHQ2-9): Low Risk  (12/10/2023)  Tobacco Use: Medium Risk (03/10/2024)   SDOH Interventions:     Readmission Risk Interventions     No data to display

## 2024-03-12 NOTE — [place] FL2 (Signed)
 [place]  MEDICAID FL2 LEVEL OF CARE FORM     IDENTIFICATION  Patient Name: John [name] Birthdate: May 04, 1961 Sex: male Admission Date (Current Location): 03/10/2024  [facility] and [place] Number:  Producer, television/film/video and Address:  [facility], [address] [place], [place] [address]      Provider Number: [id number]  Attending Physician Name and Address:  Judeth Trenda BIRCH, MD  Relative Name and Phone Number:       Current Level of Care: Hospital Recommended Level of Care: Skilled Nursing Facility Prior Approval Number:    Date Approved/Denied:   PASRR Number: [id number]  Discharge Plan:      Current Diagnoses: Patient Active Problem List   Diagnosis Date Noted   Acute pulmonary embolism (HCC) 03/10/2024   ARF (acute renal failure) (HCC) 03/10/2024   Hypokalemia 03/10/2024   Hyponatremia 03/10/2024   Maxillary fracture (HCC) 03/10/2024   Anemia 03/10/2024   Tinnitus of both ears 12/10/2023   Parkinson's disease with fluctuating manifestations (HCC) 12/02/2022   Routine general medical examination at a health care facility 10/29/2021   Family history of prostate cancer 10/29/2021   Essential hypertension 10/29/2021   Gastroesophageal reflux disease without esophagitis 10/29/2021   Obesity (BMI 30.0-34.9) 10/29/2021    Orientation RESPIRATION BLADDER Height & Weight     Self, Time, Situation, Place  Normal Continent Weight: 264 lb 8.8 oz (120 kg) Height:  6' 2 (188 cm)  BEHAVIORAL SYMPTOMS/MOOD NEUROLOGICAL BOWEL NUTRITION STATUS      Continent    AMBULATORY STATUS COMMUNICATION OF NEEDS Skin   Limited Assist Verbally Normal                       Personal Care Assistance Level of Assistance  Bathing, Feeding, Dressing Bathing Assistance: Limited assistance Feeding assistance: Limited assistance Dressing Assistance: Limited assistance     Functional Limitations Info  Sight, Hearing, Speech Sight Info:  Adequate Hearing Info: Adequate Speech Info: Adequate    SPECIAL CARE FACTORS FREQUENCY  PT (By licensed PT), OT (By licensed OT)                    Contractures Contractures Info: Not present    Additional Factors Info  Code Status Code Status Info: FULL CODE             Current Medications (03/12/2024):  This is the current hospital active medication list Current Facility-Administered Medications  Medication Dose Route Frequency Provider Last Rate Last Admin   acetaminophen  (TYLENOL ) tablet 650 mg  650 mg Oral Q6H PRN Kakrakandy, Arshad N, MD   650 mg at 03/11/24 1221   Or   acetaminophen  (TYLENOL ) suppository 650 mg  650 mg Rectal Q6H PRN Franky Redia SAILOR, MD       apixaban WINN) tablet 10 mg  10 mg Oral BID Hongalgi, Anand D, MD       Followed by   NOREEN ON 03/19/2024] apixaban (ELIQUIS) tablet 5 mg  5 mg Oral BID Hongalgi, Anand D, MD       carbidopa -levodopa  (SINEMET  CR) 50-200 MG per tablet controlled release 1 tablet  1 tablet Oral 6 X Daily Kakrakandy, Arshad N, MD   1 tablet at 03/12/24 1109   heparin  ADULT infusion 100 units/mL (25000 units/250mL)  1,350 Units/hr Intravenous Continuous Hongalgi, Anand D, MD   Stopped at 03/12/24 1111   pantoprazole  (PROTONIX ) EC tablet 40 mg  40 mg Oral Daily Franky Redia SAILOR,  MD   40 mg at 03/12/24 9096   potassium chloride  SA (KLOR-CON  M) CR tablet 40 mEq  40 mEq Oral Q4H Hongalgi, Anand D, MD   40 mEq at 03/12/24 1109   traMADol  (ULTRAM ) tablet 50 mg  50 mg Oral Q6H PRN Hongalgi, Anand D, MD   50 mg at 03/12/24 1109     Discharge Medications: Please see discharge summary for a list of discharge medications.  Relevant Imaging Results:  Relevant Lab Results:   Additional Information SS# [id number]  John [name] [place], [place]

## 2024-03-12 NOTE — Evaluation (Addendum)
 Occupational Therapy Evaluation Patient Details Name: John [name] MRN: [id number] DOB: 22-Sep-1960 Today's Date: 03/12/2024   History of Present Illness   Pt is a 63 y.o. male admitted 6/25 for frequent falls and AMS. CT showed pulmonary embolism, fxs to the R maxillary sinus & orbit, ribs 5-6 fxs.  PMH: parkinson's, HTN, CVA     Clinical Impressions Pt admitted based on above, and was seen based on problem list below. PTA pt was independent with ADLs and IADLs. Today pt is requiring set up  to CGA for ADLs. Bed mobility and functional transfers are  min  assist. Pt BP recorded in general comments, denies any dizziness. Pt with decreased safety awareness and limited family support available at d/c. Pt would benefit from <3 hours skilled rehab daily prior to d/c home. OT will continue to follow acutely to maximize functional independence.        If plan is discharge home, recommend the following:   A little help with walking and/or transfers;A little help with bathing/dressing/bathroom     Functional Status Assessment   Patient has had a recent decline in their functional status and demonstrates the ability to make significant improvements in function in a reasonable and predictable amount of time.     Equipment Recommendations   Tub/shower bench;Tub/shower seat      Precautions/Restrictions   Precautions Precautions: Fall Recall of Precautions/Restrictions: Intact Restrictions Weight Bearing Restrictions Per Provider Order: No     Mobility Bed Mobility Overal bed mobility: Needs Assistance Bed Mobility: Supine to Sit     Supine to sit: Min assist, HOB elevated, Used rails     General bed mobility comments: Min HH assist at pt's request    Transfers Overall transfer level: Needs assistance Equipment used: Rolling walker (2 wheels) Transfers: Sit to/from Stand, Bed to chair/wheelchair/BSC Sit to Stand: Min assist     Step pivot transfers: Contact  guard assist     General transfer comment: Difficulty problem solving hand placement for STS, initally min assist to steady with posterior lean, CGA once in standing      Balance Overall balance assessment: Needs assistance Sitting-balance support: No upper extremity supported, Feet supported Sitting balance-Leahy Scale: Fair   Postural control: Posterior lean Standing balance support: Bilateral upper extremity supported, During functional activity, Reliant on assistive device for balance Standing balance-Leahy Scale: Poor Standing balance comment: Reliant on RW         ADL either performed or assessed with clinical judgement   ADL Overall ADL's : Needs assistance/impaired Eating/Feeding: Set up;Sitting   Grooming: Set up;Sitting     Upper Body Dressing : Set up;Sitting   Lower Body Dressing: Contact guard assist;Sit to/from stand Lower Body Dressing Details (indicate cue type and reason): CGA with standing tasks, able to figure 4 Toilet Transfer: Ambulation;Rolling walker (2 wheels);Contact guard assist Toilet Transfer Details (indicate cue type and reason): CGA for short distance Toileting- Clothing Manipulation and Hygiene: Contact guard assist;Sit to/from stand Toileting - Clothing Manipulation Details (indicate cue type and reason): CGA for balance     Functional mobility during ADLs: Contact guard assist;Rolling walker (2 wheels) General ADL Comments: CGA to supervision for safety     Vision Baseline Vision/History: 1 Wears glasses Vision Assessment?: No apparent visual deficits            Pertinent Vitals/Pain Pain Assessment Pain Assessment: Faces Faces Pain Scale: Hurts a little bit Pain Location: R shoulder with mobility Pain Descriptors / Indicators: Discomfort Pain Intervention(s): Monitored  during session     Extremity/Trunk Assessment Upper Extremity Assessment Upper Extremity Assessment: Overall WFL for tasks assessed;RUE deficits/detail RUE  Deficits / Details: R shoulder pain with AROM, overall WFL RUE: Shoulder pain with ROM   Lower Extremity Assessment Lower Extremity Assessment: Defer to PT evaluation       Communication Communication Communication: No apparent difficulties   Cognition Arousal: Alert Behavior During Therapy: WFL for tasks assessed/performed Cognition: No apparent impairments             OT - Cognition Comments: Decreased safety awareness but overall cog WFL                 Following commands: Intact       Cueing  General Comments   Cueing Techniques: Verbal cues  Pt slightly orthostatic but denies any symptoms sitting BP: 121/81, standing BP: 101/76           Home Living Family/patient expects to be discharged to:: Private residence Living Arrangements: Non-relatives/Friends Available Help at Discharge: Family;Available PRN/intermittently Type of Home: House Home Access: Stairs to enter [employer] of Steps: 2 Entrance Stairs-Rails: None Home Layout: One level     Bathroom Shower/Tub: Chief Strategy Officer: Standard Bathroom Accessibility: Yes How Accessible: Accessible via walker Home Equipment: Rolling Walker (2 wheels);Cane - quad;BSC/3in1          Prior Functioning/Environment Prior Level of Function : Independent/Modified Independent             Mobility Comments: Primarily using cane, reports 1 fall ADLs Comments: No assist, 1 fall in shower    OT Problem List: Decreased activity tolerance;Impaired balance (sitting and/or standing);Decreased safety awareness;Decreased knowledge of use of DME or AE   OT Treatment/Interventions: Self-care/ADL training;Therapeutic exercise;Energy conservation;DME and/or AE instruction;Splinting;Therapeutic activities;Patient/family education;Balance training      OT Goals(Current goals can be found in the care plan section)   Acute Rehab OT Goals Patient Stated Goal: To go home OT Goal  Formulation: With patient Time For Goal Achievement: 04/20/24 Potential to Achieve Goals: Good   OT Frequency:  Min 2X/week       AM-PAC OT 6 Clicks Daily Activity     Outcome Measure Help from another person eating meals?: None Help from another person taking care of personal grooming?: A Little Help from another person toileting, which includes using toliet, bedpan, or urinal?: A Little Help from another person bathing (including washing, rinsing, drying)?: A Little Help from another person to put on and taking off regular upper body clothing?: A Little Help from another person to put on and taking off regular lower body clothing?: A Little 6 Click Score: 19   End of Session Equipment Utilized During Treatment: Gait belt;Rolling walker (2 wheels) Nurse Communication: Mobility status  Activity Tolerance: Patient tolerated treatment well Patient left: in chair;with call bell/phone within reach;with chair alarm set  OT Visit Diagnosis: Unsteadiness on feet (R26.81);Other abnormalities of gait and mobility (R26.89)                Time: 9194-9166 OT Time Calculation (min): 28 min Charges:  OT General Charges $OT Visit: 1 Visit OT Evaluation $OT Eval Moderate Complexity: 1 Mod OT Treatments $Self Care/Home Management : 8-22 mins  Adrianne BROCKS, OT  Acute Rehabilitation Services Office [phone] Secure chat preferred   Adrianne GORMAN Savers 03/12/2024, 8:52 AM

## 2024-03-12 NOTE — Progress Notes (Addendum)
 PROGRESS NOTE   John [name]  FMW:[id number]    DOB: [birth year]    DOA: 03/10/2024  PCP: [name], MD   I have briefly reviewed patients previous medical records in [facility].   Brief Hospital Course:  63 year old male, lives alone, has a cane and walker but only uses the cane, mostly sedentary, medical history significant for Parkinson's disease, HTN, presented to the ED due to recurrent falls including falling face forward the last time and family noted patient to be confused.  He also reported getting shortness of breath with exertion for last few weeks.  In ED, tachycardic, hypotensive which improved after IV fluid bolus, CT C/A/P with contrast showed bilateral PE with right heart strain, right maxillary sinus and inferior orbital fractures, rib fractures and S3 fracture.  Admitted for acute pulmonary embolism, gait ataxia with frequent falls and multiple fractures as noted above, acute kidney injury and hypokalemia.   Assessment & Plan:   Acute pulmonary embolism without cor pulmonale CT C/A/P with contrast 6/25: Bilateral lobar and segmental pulmonary artery emboli with right heart straining. Transient hypotension (79/67) and tachycardia up to 119 in ED which promptly resolved after IV fluid boluses.  Remains hemodynamically stable Given that his right leg is more swollen than the left, obtained lower extremity venous Dopplers and DVT ruled out. 2D echo: LVEF 60-65%, grade 1 diastolic dysfunction, RV size and function normal. Started empirically on IV heparin  drip.   Long discussion with patient that it can be dangerous if he is on anticoagulation and continues to have frequent falls which could lead to life-threatening bleeding complications.  He verbalized understanding.   Discussed in detail with patient regarding multiple options for anticoagulation including warfarin, Xarelto/Eliquis, Pradaxa, risks and benefits of anticoagulation, adverse effects of each of these  options.  Patient opted Eliquis, transition from IV heparin  to Eliquis 6/27.  Frequent falls with multiple injuries as noted below (right maxillary fracture, right inferior and medial orbital wall fracture, right 5-6 rib fracture, S3 fracture): CT maxillofacial 6/25: Acute mildly displaced fracture of the right maxillary sinus.  Acute nondisplaced fractures of the inferior rim and medial wall of the right orbit. CT head and C-spine: No acute abnormality CT C/A/P with contrast: Nondisplaced fracture through posterior elements of S3.  Fracture of anterior right 5th and 6th ribs, likely subacute or old. Concerned that this may be related to his Parkinson's disease with gait instability Orthostatic vitals negative. 6/26: Discussed with Dr. Elspeth Coddington, ENT who reviewed patient's imaging studies and indicating that the above facial fractures can be managed nonoperatively, patient to be advised that he should not blow his nose (advised today) and outpatient follow-up in the office with him in a week's time. PT has seen and recommend SNF.  TOC consulted yesterday for same.  Acute kidney injury Suspected due to dehydration Creatinine on 12/10/2023 was 1.09.  Presented with creatinine of 1.99. Resolved with IV fluids.  Dehydration with hyponatremia Resolved with IV fluids.  Encourage oral intake.  Hypokalemia Persisting.  Continue to replace aggressively and follow.  Replacing magnesium .  Hypomagnesemia Replaced.  Acute metabolic encephalopathy May be due to AKI, dehydration with hyponatremia Transient and resolved by the time he presented to ED. Delirium precautions.  Parkinson's disease Follows with Dr. Skeet, [facility] neurology. Continue levodopa   Anemia Relatively stable.  Follow CBC periodically  GERD PPI  Body mass index is 33.97 kg/m.   DVT prophylaxis:   Currently on IV heparin  drip.   Code Status:  Full Code:  Family Communication: Left VM for son. D/w sister via  phone. Disposition:  Patient has steadily improved.  Should be stable for DC to SNF pending bed and insurance approval.   Consultants:   Will attempt to call ENT consult later today.  Procedures:     Subjective:  Feels tired laying in bed.  Otherwise no complaints.  Did not complain of pain.  Specifically advised that he should not blow his nose.  Objective:   Vitals:   03/11/24 1926 03/12/24 0027 03/12/24 0428 03/12/24 0728  BP: (!) 140/80 116/70 117/66 116/70  Pulse: 82 73 65 66  Resp: 19 (!) 22 17 17   Temp: 98.2 F (36.8 C) 98.1 F (36.7 C) 98.4 F (36.9 C) 98.4 F (36.9 C)  TempSrc: Oral Oral Oral Oral  SpO2: 99% 97% 98% 94%  Weight:      Height:        General exam: Middle-age male, moderately built and obese lying comfortably propped up in bed without distress.   Respiratory system: Clear to auscultation.  No increased work of breathing. Cardiovascular system: S1 & S2 heard, RRR. No JVD, murmurs, rubs, gallops or clicks. No pedal edema.   Gastrointestinal system: Abdomen is nondistended, soft and nontender. No organomegaly or masses felt. Normal bowel sounds heard. Central nervous system: Alert and oriented. No focal neurological deficits. Extremities: Symmetric 5 x 5 power.  Did not notice any tremors. Skin: No rashes, lesions or ulcers Psychiatry: Judgement and insight appear normal. Mood & affect appropriate.  HEENT: Right periorbital and upper maxillary bruising/ecchymosis.  Mild right eye temporal subconjunctival hemorrhage-seems to be decreasing. PERTLA    Data Reviewed:   I have personally reviewed following labs and imaging studies   CBC: Recent Labs  Lab 03/10/24 1330 03/11/24 0457 03/12/24 0452  WBC 11.0* 6.8 5.1  HGB 11.2* 10.2* 9.7*  HCT 33.2* 31.3* 30.0*  MCV 83.0 84.8 84.5  PLT 250 217 199    Basic Metabolic Panel: Recent Labs  Lab 03/10/24 1330 03/11/24 0457 03/11/24 1054 03/12/24 0452  NA 134* 134*  --  135  K 2.8* 2.8*  --   3.1*  CL 97* 104  --  103  CO2 19* 18*  --  20*  GLUCOSE 115* 97  --  90  BUN 26* 22  --  18  CREATININE 1.99* 1.55*  --  1.17  CALCIUM 9.6 8.6*  --  8.6*  MG  --   --  1.6* 2.0    Liver Function Tests: Recent Labs  Lab 03/10/24 1330 03/11/24 0457 03/12/24 0452  AST 25 29 22   ALT <5 <5 <5  ALKPHOS 129* 82 75  BILITOT 1.3* 1.3* 0.8  PROT 7.3 6.1* 5.8*  ALBUMIN 4.4 3.4* 3.0*    CBG: Recent Labs  Lab 03/10/24 1331  GLUCAP 119*    Microbiology Studies:  No results found for this or any previous visit (from the past 240 hours).  Radiology Studies:  VAS US  LOWER EXTREMITY VENOUS (DVT) Result Date: 03/11/2024  Lower Venous DVT Study Patient Name:  ABENEZER ODONELL  Date of Exam:   03/11/2024 Medical Rec #: [id number]         Accession #:    [id number] Date of Birth: 07/29/1961        Patient Gender: M Patient Age:   53 years Exam Location:  [facility] Procedure:      VAS US  LOWER EXTREMITY VENOUS (DVT) Referring Phys: Dailah Opperman --------------------------------------------------------------------------------  Indications: Pulmonary embolism.  Comparison Study: Previous exam 03/20/2021 was negative for DVT Performing Technologist: Ezzie Potters RVT, RDMS  Examination Guidelines: A complete evaluation includes B-mode imaging, spectral Doppler, color Doppler, and power Doppler as needed of all accessible portions of each vessel. Bilateral testing is considered an integral part of a complete examination. Limited examinations for reoccurring indications may be performed as noted. The reflux portion of the exam is performed with the patient in reverse Trendelenburg.  +---------+---------------+---------+-----------+----------+--------------+ RIGHT    CompressibilityPhasicitySpontaneityPropertiesThrombus Aging +---------+---------------+---------+-----------+----------+--------------+ CFV      Full           Yes      Yes                                  +---------+---------------+---------+-----------+----------+--------------+ SFJ      Full                                                        +---------+---------------+---------+-----------+----------+--------------+ FV Prox  Full           Yes      Yes                                 +---------+---------------+---------+-----------+----------+--------------+ FV Mid   Full           Yes      Yes                                 +---------+---------------+---------+-----------+----------+--------------+ FV DistalFull           Yes      Yes                                 +---------+---------------+---------+-----------+----------+--------------+ PFV      Full                                                        +---------+---------------+---------+-----------+----------+--------------+ POP      Full           Yes      Yes                                 +---------+---------------+---------+-----------+----------+--------------+ PTV      Full                                                        +---------+---------------+---------+-----------+----------+--------------+ PERO     Full                                                        +---------+---------------+---------+-----------+----------+--------------+   +---------+---------------+---------+-----------+----------+--------------+  LEFT     CompressibilityPhasicitySpontaneityPropertiesThrombus Aging +---------+---------------+---------+-----------+----------+--------------+ CFV      Full           Yes      Yes                                 +---------+---------------+---------+-----------+----------+--------------+ SFJ      Full                                                        +---------+---------------+---------+-----------+----------+--------------+ FV Prox  Full           Yes      Yes                                  +---------+---------------+---------+-----------+----------+--------------+ FV Mid   Full           Yes      Yes                                 +---------+---------------+---------+-----------+----------+--------------+ FV DistalFull           Yes      Yes                                 +---------+---------------+---------+-----------+----------+--------------+ PFV      Full                                                        +---------+---------------+---------+-----------+----------+--------------+ POP      Full           Yes      Yes                                 +---------+---------------+---------+-----------+----------+--------------+ PTV      Full                                                        +---------+---------------+---------+-----------+----------+--------------+ PERO     Full                                                        +---------+---------------+---------+-----------+----------+--------------+     Summary: BILATERAL: - No evidence of deep vein thrombosis seen in the lower extremities, bilaterally. -No evidence of popliteal cyst, bilaterally.   *See table(s) above for measurements and observations. Electronically signed by Debby Robertson on 03/11/2024 at 9:42:33 PM.    Final    ECHOCARDIOGRAM COMPLETE Result Date: 03/11/2024    ECHOCARDIOGRAM REPORT   Patient Name:  Cordella MARLA Blush Date of Exam: 03/11/2024 Medical Rec #:  [id number]        Height:       74.0 in Accession #:    [id number]       Weight:       264.6 lb Date of Birth:  03-07-1961       BSA:          2.448 m Patient Age:    62 years         BP:           139/78 mmHg Patient Gender: M                HR:           75 bpm. Exam Location:  Inpatient Procedure: 2D Echo, Cardiac Doppler and Color Doppler (Both Spectral and Color            Flow Doppler were utilized during procedure). Indications:     Pulmonary Embolus I26.09  History:         Patient has no prior history of  Echocardiogram examinations.                  Stroke; Risk Factors:Hypertension.  Sonographer:     Jayson Gaskins Referring Phys:  REDIA LOISE CLEAVER Diagnosing Phys: Salena Negri MD IMPRESSIONS  1. Left ventricular ejection fraction, by estimation, is 60 to 65%. The left ventricle has normal function. The left ventricle has no regional wall motion abnormalities. Left ventricular diastolic parameters are consistent with Grade I diastolic dysfunction (impaired relaxation).  2. Right ventricular systolic function is normal. The right ventricular size is normal.  3. Left atrial size was mildly dilated.  4. Right atrial size was mildly dilated.  5. There is no evidence of cardiac tamponade.  6. The mitral valve is grossly normal. Trivial mitral valve regurgitation.  7. The aortic valve is tricuspid. Aortic valve regurgitation is not visualized.  8. The inferior vena cava is normal in size with greater than 50% respiratory variability, suggesting right atrial pressure of 3 mmHg. FINDINGS  Left Ventricle: Left ventricular ejection fraction, by estimation, is 60 to 65%. The left ventricle has normal function. The left ventricle has no regional wall motion abnormalities. The left ventricular internal cavity size was normal in size. There is  no left ventricular hypertrophy. Left ventricular diastolic parameters are consistent with Grade I diastolic dysfunction (impaired relaxation). Right Ventricle: The right ventricular size is normal. No increase in right ventricular wall thickness. Right ventricular systolic function is normal. Left Atrium: Left atrial size was mildly dilated. Right Atrium: Right atrial size was mildly dilated. Pericardium: Trivial pericardial effusion is present. The pericardial effusion is circumferential. There is no evidence of cardiac tamponade. Mitral Valve: The mitral valve is grossly normal. Trivial mitral valve regurgitation. Tricuspid Valve: The tricuspid valve is normal in structure. Tricuspid  valve regurgitation is trivial. Aortic Valve: The aortic valve is tricuspid. Aortic valve regurgitation is not visualized. Aortic valve mean gradient measures 3.0 mmHg. Aortic valve peak gradient measures 4.8 mmHg. Aortic valve area, by VTI measures 2.77 cm. Pulmonic Valve: The pulmonic valve was normal in structure. Pulmonic valve regurgitation is trivial. Aorta: The aortic root is normal in size and structure. Venous: The inferior vena cava was not well visualized. The inferior vena cava is normal in size with greater than 50% respiratory variability, suggesting right atrial pressure of 3 mmHg. IAS/Shunts: The atrial septum is grossly normal.  LEFT VENTRICLE PLAX 2D  LVIDd:         5.00 cm   Diastology LVIDs:         3.10 cm   LV e' medial:    8.81 cm/s LV PW:         0.90 cm   LV E/e' medial:  7.6 LV IVS:        0.90 cm   LV e' lateral:   8.05 cm/s LVOT diam:     2.00 cm   LV E/e' lateral: 8.3 LV SV:         61 LV SV Index:   25 LVOT Area:     3.14 cm  RIGHT VENTRICLE RV S prime:     17.10 cm/s TAPSE (M-mode): 2.6 cm LEFT ATRIUM             Index        RIGHT ATRIUM           Index LA Vol (A2C):   35.4 ml 14.46 ml/m  RA Area:     13.70 cm LA Vol (A4C):   41.4 ml 16.91 ml/m  RA Volume:   32.00 ml  13.07 ml/m LA Biplane Vol: 39.9 ml 16.30 ml/m  AORTIC VALVE AV Area (Vmax):    2.83 cm AV Area (Vmean):   2.47 cm AV Area (VTI):     2.77 cm AV Vmax:           110.00 cm/s AV Vmean:          83.400 cm/s AV VTI:            0.219 m AV Peak Grad:      4.8 mmHg AV Mean Grad:      3.0 mmHg LVOT Vmax:         99.00 cm/s LVOT Vmean:        65.700 cm/s LVOT VTI:          0.193 m LVOT/AV VTI ratio: 0.88  AORTA Ao Root diam: 2.90 cm MITRAL VALVE MV Area (PHT): 2.93 cm    SHUNTS MV Decel Time: 259 msec    Systemic VTI:  0.19 m MV E velocity: 67.10 cm/s  Systemic Diam: 2.00 cm MV A velocity: 61.30 cm/s MV E/A ratio:  1.09 Salena Negri MD Electronically signed by Salena Negri MD Signature Date/Time: 03/11/2024/10:44:04 AM     Final    CT Maxillofacial Wo Contrast Result Date: 03/10/2024 CLINICAL DATA:  Facial trauma, blunt. Multiple recent falls. Partially visualized right maxillary sinus fracture on head CT today. EXAM: CT MAXILLOFACIAL WITHOUT CONTRAST TECHNIQUE: Multidetector CT imaging of the maxillofacial structures was performed. Multiplanar CT image reconstructions were also generated. RADIATION DOSE REDUCTION: This exam was performed according to the departmental dose-optimization program which includes automated exposure control, adjustment of the mA and/or kV according to patient size and/or use of iterative reconstruction technique. COMPARISON:  Head CT earlier today.  Maxillofacial CT 05/19/2021. FINDINGS: Osseous: Acute, mildly displaced fractures of the anterior and posterior/lateral walls of the right maxillary sinus with a small amount gas in the adjacent soft tissues. Acute nondisplaced fractures of the inferior right orbital rim and medial wall the right orbit. Unchanged mildly depressed fracture of the right orbital floor more posteriorly. No mandibular dislocation or destructive process. Orbits: Mild right periorbital soft tissue swelling. No retrobulbar hematoma. Grossly intact globes. Sinuses: Right maxillary hemosinus. Clear mastoid air cells and middle ear cavities. Soft tissues: Right facial contusion. Limited intracranial: More fully evaluated on today's earlier head CT.  IMPRESSION: 1. Acute mildly displaced fractures of the right maxillary sinus. 2. Acute nondisplaced fractures of the inferior rim and medial wall of the right orbit. Electronically Signed   By: Dasie Hamburg M.D.   On: 03/10/2024 17:08   CT CHEST ABDOMEN PELVIS W CONTRAST Result Date: 03/10/2024 CLINICAL DATA:  Polytrauma. EXAM: CT CHEST, ABDOMEN, AND PELVIS WITH CONTRAST TECHNIQUE: Multidetector CT imaging of the chest, abdomen and pelvis was performed following the standard protocol during bolus administration of intravenous contrast.  RADIATION DOSE REDUCTION: This exam was performed according to the departmental dose-optimization program which includes automated exposure control, adjustment of the mA and/or kV according to patient size and/or use of iterative reconstruction technique. CONTRAST:  OMNIPAQUE  IOHEXOL  300 MG/ML  SOLN COMPARISON:  None Available. FINDINGS: CT CHEST FINDINGS Cardiovascular: There is no cardiomegaly. Small pericardial effusion measuring 12 mm anterior to the heart. Mild atherosclerotic calcification of the thoracic aorta. No intimal dilatation or dissection. The origins of the great vessels of the aortic arch appear patent. Bilateral lobar and segmental pulmonary artery emboli. The RV/LV ratio is approximately 1 indicative of right heart straining. Mediastinum/Nodes: No hilar or mediastinal adenopathy. The esophagus and the thyroid gland are grossly unremarkable. No mediastinal fluid collection. Lungs/Pleura: No focal consolidation, pleural effusion, pneumothorax. The central airways are patent. Musculoskeletal: Old healed left posterior rib fractures. Fractures of the anterior right fifth and sixth ribs, likely subacute or old. Correlation with clinical exam and point tenderness recommended. There is osteopenia with degenerative changes of the spine. No definite acute osseous pathology. CT ABDOMEN PELVIS FINDINGS No intra-abdominal free air or free fluid. Hepatobiliary: Small liver cyst. No medial tension. The gallbladder is unremarkable. Pancreas: Unremarkable. No pancreatic ductal dilatation or surrounding inflammatory changes. Spleen: Normal in size without focal abnormality. Adrenals/Urinary Tract: The adrenal glands unremarkable. There is no hydronephrosis on either side. Small exophytic left renal cyst. There is symmetric enhancement and excretion of contrast by both kidneys. The visualized ureters and urinary bladder appear unremarkable. Stomach/Bowel: There is no bowel obstruction or active inflammation.  The appendix is normal. Vascular/Lymphatic: The abdominal aorta and IVC unremarkable. No portal venous gas. There is no adenopathy. Reproductive: The prostate and seminal vesicles are grossly remarkable. No pelvic mass. Other: Small fat containing umbilical and right inguinal hernia. Musculoskeletal: Osteopenia with degenerative changes of the spine. There is nondisplaced fracture through the posterior elements of S3 (89/6 and 22/5). IMPRESSION: 1. Bilateral lobar and segmental pulmonary artery emboli with right heart straining. Correlation with EKG and troponin levels recommended. 2. Nondisplaced fracture through the posterior elements of S3. 3. Fractures of the anterior right fifth and sixth ribs, likely subacute or old. Correlation with clinical exam and point tenderness recommended. 4. No bowel obstruction. Normal appendix. 5.  Aortic Atherosclerosis (ICD10-I70.0). These results were called by telephone at the time of interpretation on 03/10/2024 at 3:27 pm to provider [facility] , who verbally acknowledged these results. Electronically Signed   By: Vanetta Chou M.D.   On: 03/10/2024 15:29   CT Head Wo Contrast Result Date: 03/10/2024 CLINICAL DATA:  Polytrauma, blunt falls, parkinsons; Polytrauma, blunt. Multiple falls in the past 2 days. Confusion. History of Parkinson's disease. EXAM: CT HEAD WITHOUT CONTRAST CT CERVICAL SPINE WITHOUT CONTRAST TECHNIQUE: Multidetector CT imaging of the head and cervical spine was performed following the standard protocol without intravenous contrast. Multiplanar CT image reconstructions of the cervical spine were also generated. RADIATION DOSE REDUCTION: This exam was performed according to the departmental dose-optimization program which  includes automated exposure control, adjustment of the mA and/or kV according to patient size and/or use of iterative reconstruction technique. COMPARISON:  CT head 05/19/2021 FINDINGS: CT HEAD FINDINGS Brain: There is no  evidence of an acute infarct, intracranial hemorrhage, mass, midline shift, or extra-axial fluid collection. There is mild cerebral atrophy. Mild chronic small vessel ischemia is again noted with an unchanged chronic lacunar infarct at the anterior aspect of the right basal ganglia. Vascular: No hyperdense vessel. Skull: No acute calvarial fracture or suspicious lesion. Sinuses/Orbits: Partially visualized minimally displaced fracture of the posterolateral wall of the right maxillary sinus with regional soft tissue gas and hemorrhage layering in the sinus. Old right orbital floor fracture. Clear mastoid air cells. Other: Partially visualized right facial soft tissue swelling. CT CERVICAL SPINE FINDINGS Alignment: Cervical spine straightening. Trace anterolisthesis of C4 on C5. Slight left convex curvature of the cervical spine. Skull base and vertebrae: No acute fracture or suspicious lesion. Soft tissues and spinal canal: No prevertebral fluid or swelling. No visible canal hematoma. Disc levels: Diffuse cervical spondylosis including prominent anterior vertebral osteophytes from C3-C7. 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 Moderate multilevel facet arthrosis. Upper chest: Clear lung apices. Other: None. IMPRESSION: 1. No evidence of acute intracranial abnormality or cervical spine fracture. 2. Partially visualized acute right maxillary sinus fracture. A maxillofacial CT is recommended to allow full characterization and assessment for any additional facial fractures. Electronically Signed   By: Dasie Hamburg M.D.   On: 03/10/2024 15:17   CT Cervical Spine Wo Contrast Result Date: 03/10/2024 CLINICAL DATA:  Polytrauma, blunt falls, parkinsons; Polytrauma, blunt. Multiple falls in the past 2 days. Confusion. History of Parkinson's disease. EXAM: CT HEAD  WITHOUT CONTRAST CT CERVICAL SPINE WITHOUT CONTRAST TECHNIQUE: Multidetector CT imaging of the head and cervical spine was performed following the standard protocol without intravenous contrast. Multiplanar CT image reconstructions of the cervical spine were also generated. RADIATION DOSE REDUCTION: This exam was performed according to the departmental dose-optimization program which includes automated exposure control, adjustment of the mA and/or kV according to patient size and/or use of iterative reconstruction technique. COMPARISON:  CT head 05/19/2021 FINDINGS: CT HEAD FINDINGS Brain: There is no evidence of an acute infarct, intracranial hemorrhage, mass, midline shift, or extra-axial fluid collection. There is mild cerebral atrophy. Mild chronic small vessel ischemia is again noted with an unchanged chronic lacunar infarct at the anterior aspect of the right basal ganglia. Vascular: No hyperdense vessel. Skull: No acute calvarial fracture or suspicious lesion. Sinuses/Orbits: Partially visualized minimally displaced fracture of the posterolateral wall of the right maxillary sinus with regional soft tissue gas and hemorrhage layering in the sinus. Old right orbital floor fracture. Clear mastoid air cells. Other: Partially visualized right facial soft tissue swelling. CT CERVICAL SPINE FINDINGS Alignment: Cervical spine straightening. Trace anterolisthesis of C4 on C5. Slight left convex curvature of the cervical spine. Skull base and vertebrae: No acute fracture or suspicious lesion. Soft tissues and spinal canal: No prevertebral fluid or swelling. No visible canal hematoma. Disc levels: Diffuse cervical spondylosis including prominent anterior vertebral osteophytes from C3-C7. 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 Moderate multilevel facet arthrosis.  Upper chest: Clear lung apices. Other: None. IMPRESSION: 1. No evidence of acute intracranial abnormality  or cervical spine fracture. 2. Partially visualized acute right maxillary sinus fracture. A maxillofacial CT is recommended to allow full characterization and assessment for any additional facial fractures. Electronically Signed   By: Dasie Hamburg M.D.   On: 03/10/2024 15:17   DG Shoulder Right Result Date: 03/10/2024 CLINICAL DATA:  Fall EXAM: RIGHT SHOULDER - 2+ VIEW COMPARISON:  None Available. FINDINGS: Advanced osteoarthritis in the right [facility] joint with joint space narrowing and spurring. Glenohumeral joint is slightly narrowed otherwise intact. No acute bony abnormality. Specifically, no fracture, subluxation, or dislocation. 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 No acute bony abnormality. Electronically Signed   By: Franky Crease M.D.   On: 03/10/2024 15:10    Scheduled Meds:    carbidopa -levodopa   1 tablet Oral 6 X Daily   pantoprazole   40 mg Oral Daily   potassium chloride   40 mEq Oral Q4H    Continuous Infusions:    heparin  1,350 Units/hr (03/11/24 2030)     LOS: 2 days     Trenda Mar, MD,  FACP, [facility], [facility], [facility]   [facility] [place]      To contact the attending provider between 7A-7P or the covering provider during after hours 7P-7A, please log into the web site [email or web address] and access using universal [place] password for that web site. If you do not have the password, please call the hospital operator.  03/12/2024, 10:35 AM

## 2024-03-13 DIAGNOSIS — E876 Hypokalemia: Secondary | ICD-10-CM | POA: Diagnosis not present

## 2024-03-13 DIAGNOSIS — N179 Acute kidney failure, unspecified: Secondary | ICD-10-CM | POA: Diagnosis not present

## 2024-03-13 DIAGNOSIS — I2699 Other pulmonary embolism without acute cor pulmonale: Secondary | ICD-10-CM | POA: Diagnosis not present

## 2024-03-13 LAB — CBC
HCT: 29.5 % — ABNORMAL LOW (ref 39.0–52.0)
Hemoglobin: 9.5 g/dL — ABNORMAL LOW (ref 13.0–17.0)
MCH: 27.5 pg (ref 26.0–34.0)
MCHC: 32.2 g/dL (ref 30.0–36.0)
MCV: 85.5 fL (ref 80.0–100.0)
Platelets: 226 10*3/uL (ref 150–400)
RBC: 3.45 MIL/uL — ABNORMAL LOW (ref 4.22–5.81)
RDW: 14 % (ref 11.5–15.5)
WBC: 5.9 10*3/uL (ref 4.0–10.5)
nRBC: 0 % (ref 0.0–0.2)

## 2024-03-13 LAB — BASIC METABOLIC PANEL WITH GFR
Anion gap: 9 (ref 5–15)
BUN: 16 mg/dL (ref 8–23)
CO2: 21 mmol/L — ABNORMAL LOW (ref 22–32)
Calcium: 8.8 mg/dL — ABNORMAL LOW (ref 8.9–10.3)
Chloride: 106 mmol/L (ref 98–111)
Creatinine, Ser: 0.97 mg/dL (ref 0.61–1.24)
GFR, Estimated: >60 mL/min (ref >60)
Glucose, Bld: 79 mg/dL (ref 70–99)
Potassium: 3.6 mmol/L (ref 3.5–5.1)
Sodium: 136 mmol/L (ref 135–145)

## 2024-03-13 LAB — MAGNESIUM: Magnesium: 1.7 mg/dL (ref 1.7–2.4)

## 2024-03-13 MED ORDER — POTASSIUM CHLORIDE CRYS ER 20 MEQ PO TBCR
40.0000 meq | EXTENDED_RELEASE_TABLET | Freq: Once | ORAL | Status: AC
  Administered 2024-03-13: 40 meq via ORAL
  Filled 2024-03-13: qty 2

## 2024-03-13 MED ORDER — MAGNESIUM SULFATE 2 GM/50ML IV SOLN
2.0000 g | Freq: Once | INTRAVENOUS | Status: AC
  Administered 2024-03-13: 2 g via INTRAVENOUS
  Filled 2024-03-13: qty 50

## 2024-03-13 NOTE — Progress Notes (Signed)
 Physical Therapy Treatment Patient Details Name: John [name] MRN: [id number] DOB: 08/09/1961 Today's Date: 03/13/2024   History of Present Illness Pt is a 63 y.o. male admitted 6/25 for frequent falls and AMS. CT showed pulmonary embolism, fxs to the R maxillary sinus & orbit, ribs 5-6 fxs.  PMH: parkinson's, HTN, CVA    PT Comments  Patient met in supine, agreeable to participate with minimal encouragement. Patient demonstrates poor ability to sequence motor tasks, requiring maxA for supine to sit transfer. Seated EOB, patient instructed to don shoes. Patient with posterior LOB, unable to self correct, requiring maxA to return to seated position. Required assistance for donning shoes. STS transfers with minA, bed height raised as necessary. Able to ambulate to/from the bathroom with 2WW with min/modA. Patient preferred to attempt ambulation without shoes. Then ambulated 50 feet with 2WW and modA for frequent posterior LOB. Balance deficits most present when changing direction or approaching thresholds with typical Parkinson's presentation. With fatigue, progressing shortened step length and R LE tremors leading to near buckling. Returned to seated position with modA. After adequate rest break, patient was assisted back to bed with modA. Overall, demonstrates poor insight to current situation and frequent falls with significant safety awareness deficits. Patient educated throughout session for balance strategies to prevent falls and improve safety. Patient will continue to benefit from skilled acute PT services. Recommend post-acute rehab (< 3 hours/day).     If plan is discharge home, recommend the following: Assistance with cooking/housework;Direct supervision/assist for medications management;Assist for transportation;Help with stairs or ramp for entrance;A lot of help with walking and/or transfers;A lot of help with bathing/dressing/bathroom   Can travel by private vehicle     Yes  Equipment  Recommendations  Other (comment) (to be determined at next level of care)    Recommendations for Other Services       Precautions / Restrictions Precautions Precautions: Fall Recall of Precautions/Restrictions: Impaired Precaution/Restrictions Comments: poor safety awareness Restrictions Weight Bearing Restrictions Per Provider Order: No     Mobility  Bed Mobility Overal bed mobility: Needs Assistance Bed Mobility: Supine to Sit, Sit to Supine     Supine to sit: HOB elevated, Used rails, Max assist Sit to supine: Mod assist, HOB elevated, Used rails   General bed mobility comments: HOB elevated; patient with poor sequencing for transfers; max cues for sequencing    Transfers Overall transfer level: Needs assistance Equipment used: Rolling walker (2 wheels) Transfers: Sit to/from Stand Sit to Stand: Min assist                Ambulation/Gait Ambulation/Gait assistance: Mod assist Gait Distance (Feet):  (15 feet x2; 50 feet x1) Assistive device: Rolling walker (2 wheels) Gait Pattern/deviations: Step-through pattern, Shuffle, Trunk flexed, Decreased step length - right, Decreased step length - left, Festinating   Gait velocity interpretation: <1.31 ft/sec, indicative of household ambulator   General Gait Details: Frequent posterior LOB, difficulty navigating thresholds, change of direction, and change of surface (fall risk mats). Gait 15 feet x2 with sneakers however patient reported he wanted to remove sneakers because he felt it was more difficult. Second gait trial without shoes. Demosntrate improved stability during straight pathways without obstacles however with added challenge, patient has frequent LOB. With fatigue, patient's step length significantly shortened and R Knee became tremorous with near buckling. PT returned patient to seated position with modA (very poor eccentric control).        Balance Overall balance assessment: Needs  assistance Sitting-balance support: No  upper extremity supported, Feet supported Sitting balance-Leahy Scale: Fair Sitting balance - Comments: Unable to maintain seated balance when participating in donning/doffing shoes. Patient demonstrated posterior LOB with no anticipatory reaction to prevent fall. MaxA for recovery to upright position. Postural control: Posterior lean Standing balance support: Bilateral upper extremity supported, During functional activity, Reliant on assistive device for balance Standing balance-Leahy Scale: Poor Standing balance comment: Poor stability with dynamic balance. Static standing, with CGA and only occasional posterior LOB.                            Communication Communication Communication: No apparent difficulties  Cognition Arousal: Alert Behavior During Therapy: WFL for tasks assessed/performed   PT - Cognitive impairments: Safety/Judgement, Problem solving, Awareness                         Following commands: Intact      Cueing Cueing Techniques: Verbal cues, Tactile cues  Exercises      General Comments        Pertinent Vitals/Pain Pain Assessment Pain Assessment: No/denies pain    Home Living                          Prior Function            PT Goals (current goals can now be found in the care plan section) Acute Rehab PT Goals PT Goal Formulation: With patient Time For Goal Achievement: 03/26/24 Potential to Achieve Goals: Good Progress towards PT goals: Progressing toward goals    Frequency    Min 3X/week      PT Plan      Co-evaluation              AM-PAC PT 6 Clicks Mobility   Outcome Measure  Help needed turning from your back to your side while in a flat bed without using bedrails?: A Little Help needed moving from lying on your back to sitting on the side of a flat bed without using bedrails?: A Lot Help needed moving to and from a bed to a chair (including a  wheelchair)?: A Lot Help needed standing up from a chair using your arms (e.g., wheelchair or bedside chair)?: A Little Help needed to walk in hospital room?: A Lot Help needed climbing 3-5 steps with a railing? : Total 6 Click Score: 13    End of Session Equipment Utilized During Treatment: Gait belt Activity Tolerance: Patient limited by fatigue Patient left: in bed;with bed alarm set;with call bell/phone within reach;Other (comment) (RN present) Nurse Communication: Mobility status;Precautions PT Visit Diagnosis: Unsteadiness on feet (R26.81);Other abnormalities of gait and mobility (R26.89);History of falling (Z91.81);Muscle weakness (generalized) (M62.81);Difficulty in walking, not elsewhere classified (R26.2);Pain     Time: 1345-1416 PT Time Calculation (min) (ACUTE ONLY): 31 min  Charges:    $Therapeutic Activity: 23-37 mins PT General Charges $$ ACUTE PT VISIT: 1 Visit                    Sherryle [place], PT, DPT [facility] Acute Rehabilitation Office: [phone]    Sherryle VEAR [place] 03/13/2024, 2:33 PM

## 2024-03-13 NOTE — Plan of Care (Signed)

## 2024-03-13 NOTE — Progress Notes (Signed)
 PROGRESS NOTE   John [name]  FMW:[id number]    DOB: 1961/08/22    DOA: 03/10/2024  PCP: [name], MD   I have briefly reviewed patients previous medical records in [facility].   Brief Hospital Course:  63 year old male, lives alone, has a cane and walker but only uses the cane, mostly sedentary, medical history significant for Parkinson's disease, HTN, presented to the ED due to recurrent falls including falling face forward the last time and family noted patient to be confused.  He also reported getting shortness of breath with exertion for last few weeks.  In ED, tachycardic, hypotensive which improved after IV fluid bolus, CT C/A/P with contrast showed bilateral PE with right heart strain, right maxillary sinus and inferior orbital fractures, rib fractures and S3 fracture.  Admitted for acute pulmonary embolism, gait ataxia with frequent falls and multiple fractures as noted above, acute kidney injury and hypokalemia.  Medically stable for DC to SNF pending bed.   Assessment & Plan:   Acute pulmonary embolism without cor pulmonale CT C/A/P with contrast 6/25: Bilateral lobar and segmental pulmonary artery emboli with right heart straining. Transient hypotension (79/67) and tachycardia up to 119 in ED which promptly resolved after IV fluid boluses.  Remains hemodynamically stable Given that his right leg is more swollen than the left, obtained lower extremity venous Dopplers and DVT ruled out. 2D echo: LVEF 60-65%, grade 1 diastolic dysfunction, RV size and function normal. Started empirically on IV heparin  drip.   Long discussion with patient that it can be dangerous if he is on anticoagulation and continues to have frequent falls which could lead to life-threatening bleeding complications.  He verbalized understanding.   Discussed in detail with patient regarding multiple options for anticoagulation including warfarin, Xarelto/Eliquis, Pradaxa, risks and benefits of  anticoagulation, adverse effects of each of these options.  Patient opted Eliquis, transition from IV heparin  to Eliquis 6/27.  Frequent falls with multiple injuries as noted below (right maxillary fracture, right inferior and medial orbital wall fracture, right 5-6 rib fracture, S3 fracture): CT maxillofacial 6/25: Acute mildly displaced fracture of the right maxillary sinus.  Acute nondisplaced fractures of the inferior rim and medial wall of the right orbit. CT head and C-spine: No acute abnormality CT C/A/P with contrast: Nondisplaced fracture through posterior elements of S3.  Fracture of anterior right 5th and 6th ribs, likely subacute or old. Concerned that this may be related to his Parkinson's disease with gait instability Orthostatic vitals negative. 6/26: Discussed with Dr. Elspeth Coddington, ENT who reviewed patient's imaging studies and indicating that the above facial fractures can be managed nonoperatively, patient to be advised that he should not blow his nose (advised today) and outpatient follow-up in the office with him in a week's time. PT has seen and recommend SNF.  TOC on board.  Awaiting SNF.  Had a long discussion with patient's sister via phone on 6/27.  She stated that patient has been having unsteady gait for more than a year, progressively worsening with frequent falls.  She does not think that he is able to care for himself.  We agreed that after short-term rehab at [facility], depending on how he does, he will either need home 24/7 assistance and supervision which if not available then may need LTC.  Acute kidney injury Suspected due to dehydration Creatinine on 12/10/2023 was 1.09.  Presented with creatinine of 1.99. Resolved with IV fluids.  Dehydration with hyponatremia Resolved with IV fluids.  Encourage  oral intake.  Hypokalemia Improved.  Will replace additionally today to try and keep closer to 4.  Hypomagnesemia Replaced.  Acute metabolic encephalopathy May be  due to AKI, dehydration with hyponatremia Transient and resolved by the time he presented to ED. Delirium precautions.  Parkinson's disease Follows with Dr. Skeet, [facility] neurology. Continue levodopa   Anemia Relatively stable.  Follow CBC periodically as outpatient.  GERD PPI  Body mass index is 33.97 kg/m.   DVT prophylaxis:   Close anticoagulation   Code Status: Full Code:  Family Communication: Left VM for son. D/w sister via phone. Disposition:  Patient has steadily improved.  Should be stable for DC to SNF pending bed and insurance approval.   Consultants:   Will attempt to call ENT consult later today.  Procedures:     Subjective:  Denies complaints.  Not much pain.  Advised him that we will be stopping tramadol  because of related sedation and increased fall risk.  Recommended that he use Tylenol  and he is agreeable.  Objective:   Vitals:   03/12/24 1625 03/12/24 1936 03/13/24 0347 03/13/24 0828  BP: 108/74 114/72 127/80 127/80  Pulse: 67 69 71 73  Resp: 16 18 19 16   Temp: 98.2 F (36.8 C) 98.4 F (36.9 C) 98.1 F (36.7 C) 98.6 F (37 C)  TempSrc: Oral Oral Oral Oral  SpO2: 98% 97% 100% 98%  Weight:      Height:        General exam: Middle-age male, moderately built and obese lying comfortably propped up in bed without distress.   Respiratory system: Clear to auscultation.  No increased work of breathing. Cardiovascular system: S1 & S2 heard, RRR. No JVD, murmurs, rubs, gallops or clicks. No pedal edema.  Telemetry personally reviewed: Sinus rhythm.  Discontinued telemetry. Gastrointestinal system: Abdomen is nondistended, soft and nontender. No organomegaly or masses felt. Normal bowel sounds heard. Central nervous system: Alert and oriented. No focal neurological deficits. Extremities: Symmetric 5 x 5 power.  Did not notice any tremors. Skin: No rashes, lesions or ulcers Psychiatry: Judgement and insight appear normal. Mood & affect appropriate.   HEENT: Right periorbital and upper maxillary bruising/ecchymosis that is steadily decreasing.  Mild right eye temporal subconjunctival hemorrhage-seems to be decreasing. PERTLA    Data Reviewed:   I have personally reviewed following labs and imaging studies   CBC: Recent Labs  Lab 03/11/24 0457 03/12/24 0452 03/13/24 0434  WBC 6.8 5.1 5.9  HGB 10.2* 9.7* 9.5*  HCT 31.3* 30.0* 29.5*  MCV 84.8 84.5 85.5  PLT 217 199 226    Basic Metabolic Panel: Recent Labs  Lab 03/10/24 1330 03/11/24 0457 03/11/24 1054 03/12/24 0452 03/13/24 0434  NA 134* 134*  --  135 136  K 2.8* 2.8*  --  3.1* 3.6  CL 97* 104  --  103 106  CO2 19* 18*  --  20* 21*  GLUCOSE 115* 97  --  90 79  BUN 26* 22  --  18 16  CREATININE 1.99* 1.55*  --  1.17 0.97  CALCIUM 9.6 8.6*  --  8.6* 8.8*  MG  --   --  1.6* 2.0 1.7    Liver Function Tests: Recent Labs  Lab 03/10/24 1330 03/11/24 0457 03/12/24 0452  AST 25 29 22   ALT <5 <5 <5  ALKPHOS 129* 82 75  BILITOT 1.3* 1.3* 0.8  PROT 7.3 6.1* 5.8*  ALBUMIN 4.4 3.4* 3.0*    CBG: Recent Labs  Lab 03/10/24 1331  GLUCAP 119*    Microbiology Studies:  No results found for this or any previous visit (from the past 240 hours).  Radiology Studies:  VAS US  LOWER EXTREMITY VENOUS (DVT) Result Date: 03/11/2024  Lower Venous DVT Study Patient Name:  BABE CLENNEY  Date of Exam:   03/11/2024 Medical Rec #: [id number]         Accession #:    [id number] Date of Birth: 1960-12-05        Patient Gender: M Patient Age:   53 years Exam Location:  [facility] Procedure:      VAS US  LOWER EXTREMITY VENOUS (DVT) Referring Phys: Dorthie Santini --------------------------------------------------------------------------------  Indications: Pulmonary embolism.  Comparison Study: Previous exam 03/20/2021 was negative for DVT Performing Technologist: Ezzie Potters RVT, RDMS  Examination Guidelines: A complete evaluation includes B-mode imaging, spectral Doppler, color  Doppler, and power Doppler as needed of all accessible portions of each vessel. Bilateral testing is considered an integral part of a complete examination. Limited examinations for reoccurring indications may be performed as noted. The reflux portion of the exam is performed with the patient in reverse Trendelenburg.  +---------+---------------+---------+-----------+----------+--------------+ RIGHT    CompressibilityPhasicitySpontaneityPropertiesThrombus Aging +---------+---------------+---------+-----------+----------+--------------+ CFV      Full           Yes      Yes                                 +---------+---------------+---------+-----------+----------+--------------+ SFJ      Full                                                        +---------+---------------+---------+-----------+----------+--------------+ FV Prox  Full           Yes      Yes                                 +---------+---------------+---------+-----------+----------+--------------+ FV Mid   Full           Yes      Yes                                 +---------+---------------+---------+-----------+----------+--------------+ FV DistalFull           Yes      Yes                                 +---------+---------------+---------+-----------+----------+--------------+ PFV      Full                                                        +---------+---------------+---------+-----------+----------+--------------+ POP      Full           Yes      Yes                                 +---------+---------------+---------+-----------+----------+--------------+  PTV      Full                                                        +---------+---------------+---------+-----------+----------+--------------+ PERO     Full                                                        +---------+---------------+---------+-----------+----------+--------------+    +---------+---------------+---------+-----------+----------+--------------+ LEFT     CompressibilityPhasicitySpontaneityPropertiesThrombus Aging +---------+---------------+---------+-----------+----------+--------------+ CFV      Full           Yes      Yes                                 +---------+---------------+---------+-----------+----------+--------------+ SFJ      Full                                                        +---------+---------------+---------+-----------+----------+--------------+ FV Prox  Full           Yes      Yes                                 +---------+---------------+---------+-----------+----------+--------------+ FV Mid   Full           Yes      Yes                                 +---------+---------------+---------+-----------+----------+--------------+ FV DistalFull           Yes      Yes                                 +---------+---------------+---------+-----------+----------+--------------+ PFV      Full                                                        +---------+---------------+---------+-----------+----------+--------------+ POP      Full           Yes      Yes                                 +---------+---------------+---------+-----------+----------+--------------+ PTV      Full                                                        +---------+---------------+---------+-----------+----------+--------------+  PERO     Full                                                        +---------+---------------+---------+-----------+----------+--------------+     Summary: BILATERAL: - No evidence of deep vein thrombosis seen in the lower extremities, bilaterally. -No evidence of popliteal cyst, bilaterally.   *See table(s) above for measurements and observations. Electronically signed by Debby Robertson on 03/11/2024 at 9:42:33 PM.    Final     Scheduled Meds:    apixaban  10 mg Oral BID   Followed by    NOREEN ON 03/19/2024] apixaban  5 mg Oral BID   carbidopa -levodopa   1 tablet Oral 6 X Daily   pantoprazole   40 mg Oral Daily    Continuous Infusions:       LOS: 3 days     Trenda Mar, MD,  FACP, [facility], [facility], [facility]   [facility] [place]      To contact the attending provider between 7A-7P or the covering provider during after hours 7P-7A, please log into the web site [email or web address] and access using universal [place] password for that web site. If you do not have the password, please call the hospital operator.  03/13/2024, 12:27 PM

## 2024-03-13 NOTE — Plan of Care (Signed)

## 2024-03-13 NOTE — TOC Progression Note (Signed)
 Transition of Care [facility]) - Initial/Assessment Note    Patient Details  Name: John [name] MRN: [id number] Date of Birth: [birth year]  Transition of Care [facility]) CM/SW Contact:    Britt JULIANNA Bennetts, LCSW Phone Number: 03/13/2024, 3:20 PM  Clinical Narrative:                 CSW spoke with pt and presented SNF bed offers.  Pt's facility choice is [name].  CSW contacted Logan in admissions at [name].  Logan requested that pt be informed that there is a possibility that his disability check will be stopped or have to be given to SNF.  CSW contacted pt and informed of the above.  Pt is in agreement with facility submitting for insurance authorization.  CSW contacted Logan at [facility] and confirmed that pt has been made aware that the disability check may have to be provided to SNF.    SNF will start insurance authorization process on Monday.  TOC will continue to follow.    Expected Discharge Plan: Skilled Nursing Facility Barriers to Discharge: Insurance Authorization, SNF Pending bed offer   Patient Goals and CMS Choice   CMS Medicare.gov Compare Post Acute Care list provided to:: Patient Choice offered to / list presented to : Patient      Expected Discharge Plan and Services       Living arrangements for the past 2 months: Skilled Nursing Facility                                      Prior Living Arrangements/Services Living arrangements for the past 2 months: Skilled Nursing Facility Lives with:: Roommate Patient language and need for interpreter reviewed:: Yes Do you feel safe going back to the place where you live?: Yes      Need for Family Participation in Patient Care: Yes (Comment) Care giver support system in place?: No (comment)   Criminal Activity/Legal Involvement Pertinent to Current Situation/Hospitalization: No - Comment as needed  Activities of Daily Living   ADL Screening (condition at time of admission) Independently performs  ADLs?: Yes (appropriate for developmental age) Is the patient deaf or have difficulty hearing?: No Does the patient have difficulty seeing, even when wearing glasses/contacts?: No Does the patient have difficulty concentrating, remembering, or making decisions?: No  Permission Sought/Granted Permission sought to share information with : Oceanographer granted to share information with : Yes, Verbal Permission Granted              Emotional Assessment       Orientation: : Oriented to Self, Oriented to Place, Oriented to  Time, Oriented to Situation Alcohol / Substance Use: Not Applicable Psych Involvement: No (comment)  Admission diagnosis:  Acute pulmonary embolism (HCC) [I26.99] Fall, initial encounter [W19.XXXA] Closed fracture of multiple ribs, unspecified laterality, initial encounter [S22.49XA] Closed fracture of facial bone, unspecified facial bone, initial encounter (HCC) [S02.92XA] Closed fracture of sacrum, unspecified portion of sacrum, initial encounter (HCC) [S32.10XA] Acute pulmonary embolism, unspecified pulmonary embolism type, unspecified whether acute cor pulmonale present [facility]) [I26.99] Patient Active Problem List   Diagnosis Date Noted   Acute pulmonary embolism (HCC) 03/10/2024   ARF (acute renal failure) (HCC) 03/10/2024   Hypokalemia 03/10/2024   Hyponatremia 03/10/2024   Maxillary fracture (HCC) 03/10/2024   Anemia 03/10/2024   Tinnitus of both ears 12/10/2023   Parkinson's disease with fluctuating manifestations (HCC) 12/02/2022  Routine general medical examination at a health care facility 10/29/2021   Family history of prostate cancer 10/29/2021   Essential hypertension 10/29/2021   Gastroesophageal reflux disease without esophagitis 10/29/2021   Obesity (BMI 30.0-34.9) 10/29/2021   PCP:  [name], MD Pharmacy:   [facility] [address], [place] - [address] [place] [address] Phone: [phone] Fax: [phone]  [facility] - [place] - [address] [place] [address] Phone: [phone] Fax: [phone]  [facility] [address] [place] [address] Phone: [phone] Fax: [phone]     Social Drivers of Health (SDOH) Social History: SDOH Screenings   Food Insecurity: No Food Insecurity (03/10/2024)  Housing: Low Risk  (03/10/2024)  Transportation Needs: No Transportation Needs (03/10/2024)  Utilities: Not At Risk (03/10/2024)  Depression (PHQ2-9): Low Risk  (12/10/2023)  Tobacco Use: Medium Risk (03/10/2024)   SDOH Interventions:     Readmission Risk Interventions     No data to display

## 2024-03-14 DIAGNOSIS — N179 Acute kidney failure, unspecified: Secondary | ICD-10-CM | POA: Diagnosis not present

## 2024-03-14 DIAGNOSIS — I2699 Other pulmonary embolism without acute cor pulmonale: Secondary | ICD-10-CM | POA: Diagnosis not present

## 2024-03-14 DIAGNOSIS — E876 Hypokalemia: Secondary | ICD-10-CM | POA: Diagnosis not present

## 2024-03-14 NOTE — Plan of Care (Signed)
  Problem: Education: Goal: Knowledge of General Education information will improve Description: Including pain rating scale, medication(s)/side effects and non-pharmacologic comfort measures Outcome: Not Progressing   Problem: Health Behavior/Discharge Planning: Goal: Ability to manage health-related needs will improve Outcome: Progressing   Problem: Clinical Measurements: Goal: Ability to maintain clinical measurements within normal limits will improve Outcome: Progressing   Problem: Clinical Measurements: Goal: Will remain free from infection Outcome: Progressing   Problem: Clinical Measurements: Goal: Diagnostic test results will improve Outcome: Progressing   Problem: Clinical Measurements: Goal: Respiratory complications will improve Outcome: Progressing   Problem: Clinical Measurements: Goal: Cardiovascular complication will be avoided Outcome: Progressing   Problem: Activity: Goal: Risk for activity intolerance will decrease Outcome: Progressing   Problem: Coping: Goal: Level of anxiety will decrease Outcome: Progressing   Problem: Elimination: Goal: Will not experience complications related to bowel motility Outcome: Progressing   Problem: Pain Managment: Goal: General experience of comfort will improve and/or be controlled Outcome: Progressing   Problem: Safety: Goal: Ability to remain free from injury will improve Outcome: Progressing   Problem: Skin Integrity: Goal: Risk for impaired skin integrity will decrease Outcome: Progressing

## 2024-03-14 NOTE — Progress Notes (Signed)
 PROGRESS NOTE   John [name]  FMW:[id number]    DOB: [birth year]    DOA: 03/10/2024  PCP: [name], MD   I have briefly reviewed patients previous medical records in [facility].   Brief Hospital Course:  63 year old male, lives alone, has a cane and walker but only uses the cane, mostly sedentary, medical history significant for Parkinson's disease, HTN, presented to the ED due to recurrent falls including falling face forward the last time and family noted patient to be confused.  He also reported getting shortness of breath with exertion for last few weeks.  In ED, tachycardic, hypotensive which improved after IV fluid bolus, CT C/A/P with contrast showed bilateral PE with right heart strain, right maxillary sinus and inferior orbital fractures, rib fractures and S3 fracture.  Admitted for acute pulmonary embolism, gait ataxia with frequent falls and multiple fractures as noted above, acute kidney injury and hypokalemia.  Medically stable for DC to SNF pending bed.   Assessment & Plan:   Acute pulmonary embolism without cor pulmonale CT C/A/P with contrast 6/25: Bilateral lobar and segmental pulmonary artery emboli with right heart straining. Transient hypotension (79/67) and tachycardia up to 119 in ED which promptly resolved after IV fluid boluses.  Remains hemodynamically stable Given that his right leg is more swollen than the left, obtained lower extremity venous Dopplers and DVT ruled out. 2D echo: LVEF 60-65%, grade 1 diastolic dysfunction, RV size and function normal. Started empirically on IV heparin  drip.   Long discussion with patient that it can be dangerous if he is on anticoagulation and continues to have frequent falls which could lead to life-threatening bleeding complications.  He verbalized understanding.   Discussed in detail with patient regarding multiple options for anticoagulation including warfarin, Xarelto/Eliquis, Pradaxa, risks and benefits of  anticoagulation, adverse effects of each of these options.  Patient opted Eliquis, transition from IV heparin  to Eliquis 6/27.  Frequent falls with multiple injuries as noted below (right maxillary fracture, right inferior and medial orbital wall fracture, right 5-6 rib fracture, S3 fracture): CT maxillofacial 6/25: Acute mildly displaced fracture of the right maxillary sinus.  Acute nondisplaced fractures of the inferior rim and medial wall of the right orbit. CT head and C-spine: No acute abnormality CT C/A/P with contrast: Nondisplaced fracture through posterior elements of S3.  Fracture of anterior right 5th and 6th ribs, likely subacute or old. Concerned that this may be related to his Parkinson's disease with gait instability Orthostatic vitals negative. 6/26: Discussed with Dr. Elspeth Coddington, ENT who reviewed patient's imaging studies and indicating that the above facial fractures can be managed nonoperatively, patient to be advised that he should not blow his nose (advised today) and outpatient follow-up in the office with him in a week's time. PT has seen and recommend SNF.  TOC on board.  Awaiting SNF.  Had a long discussion with patient's sister via phone on 6/27.  She stated that patient has been having unsteady gait for more than a year, progressively worsening with frequent falls.  She does not think that he is able to care for himself.  We agreed that after short-term rehab at [facility], depending on how he does, he will either need home 24/7 assistance and supervision which if not available then may need LTC.  Acute kidney injury Suspected due to dehydration Creatinine on 12/10/2023 was 1.09.  Presented with creatinine of 1.99. Resolved with IV fluids.  Dehydration with hyponatremia Resolved with IV fluids.  Encourage  oral intake.  Hypokalemia Improved.  Will replace additionally today to try and keep closer to 4.  Hypomagnesemia Replaced.  Acute metabolic encephalopathy May be  due to AKI, dehydration with hyponatremia Transient and resolved by the time he presented to ED. Delirium precautions.  Parkinson's disease Follows with Dr. Skeet, [facility] neurology. Continue levodopa   Anemia Relatively stable.  Follow CBC periodically as outpatient.  GERD PPI  Body mass index is 33.97 kg/m.   DVT prophylaxis:   Close anticoagulation   Code Status: Full Code:  Family Communication: Left VM for son. D/w sister via phone. Disposition:  Patient has steadily improved.  Should be stable for DC to SNF pending bed and insurance approval.   Consultants:     Procedures:     Subjective:  Denies complaints.  Wants to get out of here.  Wants to shower.  No pain reported.  Objective:   Vitals:   03/13/24 1952 03/13/24 2349 03/14/24 0356 03/14/24 1106  BP: 119/73 107/70 (!) 142/92 128/81  Pulse: 84 83 87 83  Resp: 15 18 20 16   Temp: 98.5 F (36.9 C) 98.4 F (36.9 C) 98.4 F (36.9 C) 98.4 F (36.9 C)  TempSrc: Oral Oral Oral Oral  SpO2: 97% 100% 96% 96%  Weight:      Height:       Stable exam compared to yesterday.  No new findings.  General exam: Middle-age male, moderately built and obese lying comfortably propped up in bed without distress.   Respiratory system: Clear to auscultation.  No increased work of breathing. Cardiovascular system: S1 & S2 heard, RRR. No JVD, murmurs, rubs, gallops or clicks. No pedal edema.  Telemetry personally reviewed: Sinus rhythm.  Discontinued telemetry. Gastrointestinal system: Abdomen is nondistended, soft and nontender. No organomegaly or masses felt. Normal bowel sounds heard. Central nervous system: Alert and oriented. No focal neurological deficits. Extremities: Symmetric 5 x 5 power.  Did not notice any tremors. Skin: No rashes, lesions or ulcers Psychiatry: Judgement and insight appear normal. Mood & affect appropriate.  HEENT: Right periorbital and upper maxillary bruising/ecchymosis that is steadily  decreasing.  Mild right eye temporal subconjunctival hemorrhage-seems to be decreasing. PERTLA    Data Reviewed:   I have personally reviewed following labs and imaging studies   CBC: Recent Labs  Lab 03/11/24 0457 03/12/24 0452 03/13/24 0434  WBC 6.8 5.1 5.9  HGB 10.2* 9.7* 9.5*  HCT 31.3* 30.0* 29.5*  MCV 84.8 84.5 85.5  PLT 217 199 226    Basic Metabolic Panel: Recent Labs  Lab 03/10/24 1330 03/11/24 0457 03/11/24 1054 03/12/24 0452 03/13/24 0434  NA 134* 134*  --  135 136  K 2.8* 2.8*  --  3.1* 3.6  CL 97* 104  --  103 106  CO2 19* 18*  --  20* 21*  GLUCOSE 115* 97  --  90 79  BUN 26* 22  --  18 16  CREATININE 1.99* 1.55*  --  1.17 0.97  CALCIUM 9.6 8.6*  --  8.6* 8.8*  MG  --   --  1.6* 2.0 1.7    Liver Function Tests: Recent Labs  Lab 03/10/24 1330 03/11/24 0457 03/12/24 0452  AST 25 29 22   ALT <5 <5 <5  ALKPHOS 129* 82 75  BILITOT 1.3* 1.3* 0.8  PROT 7.3 6.1* 5.8*  ALBUMIN 4.4 3.4* 3.0*    CBG: Recent Labs  Lab 03/10/24 1331  GLUCAP 119*    Microbiology Studies:  No results found for this  or any previous visit (from the past 240 hours).  Radiology Studies:  No results found.   Scheduled Meds:    apixaban  10 mg Oral BID   Followed by   NOREEN ON 03/19/2024] apixaban  5 mg Oral BID   carbidopa -levodopa   1 tablet Oral 6 X Daily   pantoprazole   40 mg Oral Daily    Continuous Infusions:       LOS: 4 days     Trenda Mar, MD,  FACP, [facility], [facility], [facility]   [facility] [place]      To contact the attending provider between 7A-7P or the covering provider during after hours 7P-7A, please log into the web site [email or web address] and access using universal [place] password for that web site. If you do not have the password, please call the hospital operator.  03/14/2024, 11:13 AM

## 2024-03-14 NOTE — Plan of Care (Signed)

## 2024-03-15 DIAGNOSIS — E876 Hypokalemia: Secondary | ICD-10-CM | POA: Diagnosis not present

## 2024-03-15 DIAGNOSIS — N179 Acute kidney failure, unspecified: Secondary | ICD-10-CM | POA: Diagnosis not present

## 2024-03-15 DIAGNOSIS — I2699 Other pulmonary embolism without acute cor pulmonale: Secondary | ICD-10-CM | POA: Diagnosis not present

## 2024-03-15 NOTE — TOC Progression Note (Signed)
 Transition of Care [facility]) - Progression Note    Patient Details  Name: John [name] MRN: [id number] Date of Birth: 01-05-1961  Transition of Care [facility]) CM/SW Contact  Lindey Renzulli A [place], LCSW Phone Number: 03/15/2024, 11:24 AM  Clinical Narrative:     CSW reached out to [place], spoke with [place], received confirmation that they are starting his authorization for short term rehab today, notified PT note from 03/13/24.   Auth currently pending.      TOC will continue to follow.    Expected Discharge Plan: Skilled Nursing Facility Barriers to Discharge: Insurance Authorization, SNF Pending bed offer  Expected Discharge Plan and Services       Living arrangements for the past 2 months: Skilled Nursing Facility                                       Social Determinants of Health (SDOH) Interventions SDOH Screenings   Food Insecurity: No Food Insecurity (03/10/2024)  Housing: Low Risk  (03/10/2024)  Transportation Needs: No Transportation Needs (03/10/2024)  Utilities: Not At Risk (03/10/2024)  Depression (PHQ2-9): Low Risk  (12/10/2023)  Tobacco Use: Medium Risk (03/10/2024)    Readmission Risk Interventions     No data to display

## 2024-03-15 NOTE — Progress Notes (Signed)
 Occupational Therapy Treatment Patient Details Name: John [name] MRN: [id number] DOB: [birth year] Today's Date: 03/15/2024   History of present illness Pt is a 63 y.o. male admitted 6/25 for frequent falls and AMS. CT showed pulmonary embolism, fxs to the R maxillary sinus & orbit, ribs 5-6 fxs.  PMH: parkinson's, HTN, CVA   OT comments  Pt progressing toward goals this session, overall needing up to min A for ADLs, mod A for bed mobility and min A for transfers with RW. Pt ambulating to bathroom for standing urination, needs cues to keep RW on ground and pt urinating partially on the floor needing min A to correct posterior lean. Pt abandoning RW before pivoting to bed, overall decr safety awareness give prior falls. Pt presenting with impairments listed below, will follow acutely. Patient will benefit from continued inpatient follow up therapy, <3 hours/day to maximize safety/ind with ADL/functional mobility.       If plan is discharge home, recommend the following:  A little help with walking and/or transfers;A little help with bathing/dressing/bathroom   Equipment Recommendations  Tub/shower bench;Tub/shower seat    Recommendations for Other Services      Precautions / Restrictions Precautions Precautions: Fall Recall of Precautions/Restrictions: Impaired Precaution/Restrictions Comments: poor safety awareness Restrictions Weight Bearing Restrictions Per Provider Order: No       Mobility Bed Mobility Overal bed mobility: Needs Assistance Bed Mobility: Supine to Sit, Sit to Supine     Supine to sit: HOB elevated, Used rails, Mod assist Sit to supine: HOB elevated, Used rails, Supervision   General bed mobility comments: assist for trunk elevation to EOB    Transfers Overall transfer level: Needs assistance Equipment used: Rolling walker (2 wheels) Transfers: Sit to/from Stand Sit to Stand: Min assist                 Balance Overall balance assessment:  Needs assistance Sitting-balance support: No upper extremity supported, Feet supported Sitting balance-Leahy Scale: Fair   Postural control: Posterior lean Standing balance support: Bilateral upper extremity supported, During functional activity, Reliant on assistive device for balance Standing balance-Leahy Scale: Poor Standing balance comment: Poor stability with dynamic balance. Static standing, with CGA and only occasional posterior LOB.                           ADL either performed or assessed with clinical judgement   ADL Overall ADL's : Needs assistance/impaired                 Upper Body Dressing : Contact guard assist;Sitting       Toilet Transfer: Minimal assistance;Ambulation;Rolling walker (2 wheels);Regular Toilet   Toileting- Clothing Manipulation and Hygiene: Minimal assistance       Functional mobility during ADLs: Minimal assistance;Rolling walker (2 wheels)      Extremity/Trunk Assessment Upper Extremity Assessment Upper Extremity Assessment: Generalized weakness   Lower Extremity Assessment Lower Extremity Assessment: Defer to PT evaluation        Vision   Vision Assessment?: No apparent visual deficits   Perception Perception Perception: Not tested   Praxis Praxis Praxis: Not tested   Communication Communication Communication: No apparent difficulties   Cognition Arousal: Alert Behavior During Therapy: WFL for tasks assessed/performed Cognition: No apparent impairments             OT - Cognition Comments: Decreased safety awareness but overall cog [facility]  Following commands: Intact Following commands impaired: Follows one step commands with increased time, Follows multi-step commands with increased time      Cueing   Cueing Techniques: Verbal cues, Tactile cues  Exercises      Shoulder Instructions       General Comments VSS on RA    Pertinent Vitals/ Pain       Pain Assessment Pain  Assessment: Faces Pain Score: 4  Faces Pain Scale: Hurts little more Pain Location: generalized with movement Pain Descriptors / Indicators: Discomfort Pain Intervention(s): Limited activity within patient's tolerance, Monitored during session, Repositioned  Home Living                                          Prior Functioning/Environment              Frequency  Min 2X/week        Progress Toward Goals  OT Goals(current goals can now be found in the care plan section)  Progress towards OT goals: Progressing toward goals  Acute Rehab OT Goals Patient Stated Goal: none stated OT Goal Formulation: With patient Time For Goal Achievement: 04/20/24 Potential to Achieve Goals: Good ADL Goals Pt Will Perform Grooming: standing;with supervision Pt Will Perform Lower Body Dressing: with supervision;sit to/from stand Pt Will Transfer to Toilet: with supervision;ambulating;regular height toilet Pt Will Perform Toileting - Clothing Manipulation and hygiene: with supervision;sit to/from stand Pt Will Perform Tub/Shower Transfer: with supervision;tub bench;Tub transfer;ambulating;rolling walker  Plan      Co-evaluation                 AM-PAC OT 6 Clicks Daily Activity     Outcome Measure   Help from another person eating meals?: A Little Help from another person taking care of personal grooming?: A Little Help from another person toileting, which includes using toliet, bedpan, or urinal?: A Little Help from another person bathing (including washing, rinsing, drying)?: A Lot Help from another person to put on and taking off regular upper body clothing?: A Little Help from another person to put on and taking off regular lower body clothing?: A Little 6 Click Score: 17    End of Session Equipment Utilized During Treatment: Gait belt;Rolling walker (2 wheels)  OT Visit Diagnosis: Unsteadiness on feet (R26.81);Other abnormalities of gait and mobility  (R26.89)   Activity Tolerance Patient tolerated treatment well   Patient Left in bed;with call bell/phone within reach;with bed alarm set   Nurse Communication Mobility status        Time: 8980-8963 OT Time Calculation (min): 17 min  Charges: OT General Charges $OT Visit: 1 Visit OT Treatments $Self Care/Home Management : 8-22 mins  Piya Mesch K, OTD, OTR/L SecureChat Preferred Acute Rehab [phone]   Laneta POUR Koonce 03/15/2024, 10:56 AM

## 2024-03-15 NOTE — Progress Notes (Signed)
 PROGRESS NOTE   KAITLYN SKOWRON  FMW:[id number]    DOB: [birth year]    DOA: 03/10/2024  PCP: [name], MD   I have briefly reviewed patients previous medical records in [facility].   Brief Hospital Course:  63 year old male, lives alone, has a cane and walker but only uses the cane, mostly sedentary, medical history significant for Parkinson's disease, HTN, presented to the ED due to recurrent falls including falling face forward the last time and family noted patient to be confused.  He also reported getting shortness of breath with exertion for last few weeks.  In ED, tachycardic, hypotensive which improved after IV fluid bolus, CT C/A/P with contrast showed bilateral PE with right heart strain, right maxillary sinus and inferior orbital fractures, rib fractures and S3 fracture.  Admitted for acute pulmonary embolism, gait ataxia with frequent falls and multiple fractures as noted above, acute kidney injury and hypokalemia.  Medically stable for DC to SNF since 6/28, patient opted for [facility].  Insurance authorization supposed to start 6/30.  Delays noted.   Assessment & Plan:   Acute pulmonary embolism without cor pulmonale CT C/A/P with contrast 6/25: Bilateral lobar and segmental pulmonary artery emboli with right heart straining. Hemodynamically stable Lower extremity venous Dopplers and DVT ruled out. 2D echo: LVEF 60-65%, grade 1 diastolic dysfunction, RV size and function normal. Long discussion with patient that it can be dangerous if he is on anticoagulation and continues to have frequent falls which could lead to life-threatening bleeding complications.  He verbalized understanding.   Transitioned from IV heparin  to Eliquis 6/27.  Continue.  Stable.  Frequent falls with multiple injuries as noted below (right maxillary fracture, right inferior and medial orbital wall fracture, right 5-6 rib fracture, S3 fracture): CT maxillofacial 6/25: Acute mildly displaced  fracture of the right maxillary sinus.  Acute nondisplaced fractures of the inferior rim and medial wall of the right orbit. CT head and C-spine: No acute abnormality CT C/A/P with contrast: Nondisplaced fracture through posterior elements of S3.  Fracture of anterior right 5th and 6th ribs, likely subacute or old. Concerned that this may be related to his Parkinson's disease with gait instability Orthostatic vitals negative. 6/26: Discussed with Dr. Elspeth Coddington, ENT who reviewed patient's imaging studies and indicated that the above facial fractures can be managed nonoperatively, patient was advised that he should not blow his nose and outpatient follow-up in the office with him in a week's time. PT has seen and recommend SNF.  TOC on board.  Awaiting SNF.  Had a long discussion with patient's sister via phone on 6/27.  She stated that patient has been having unsteady gait for more than a year, progressively worsening with frequent falls.  She does not think that he is able to care for himself.  We agreed that after short-term rehab at [facility], depending on how he does, he will either need home 24/7 assistance and supervision which if not available then may need LTC.  Acute kidney injury Suspected due to dehydration Creatinine on 12/10/2023 was 1.09.  Presented with creatinine of 1.99. Resolved with IV fluids.  Dehydration with hyponatremia Resolved with IV fluids.  Encourage oral intake.  Hypokalemia Improved.  Will replace additionally today to try and keep closer to 4.  Hypomagnesemia Replaced.  Acute metabolic encephalopathy May be due to AKI, dehydration with hyponatremia Transient and resolved by the time he presented to ED. Delirium precautions.  Parkinson's disease Follows with Dr. Skeet, [facility] neurology. Continue  levodopa   Anemia Relatively stable.  Follow CBC periodically as outpatient.  GERD PPI  Body mass index is 33.97 kg/m.   DVT prophylaxis:   Close  anticoagulation   Code Status: Full Code:  Family Communication: None at bedside Disposition:  Medically stable for DC to SNF pending insurance authorization.   Consultants:     Procedures:     Subjective:  Continues to deny complaints and wants to get out of here.  Objective:   Vitals:   03/14/24 2055 03/14/24 2339 03/15/24 0516 03/15/24 0814  BP: 122/69 126/75 (!) 146/89 116/76  Pulse: 88 94 90 91  Resp: 17 18 18 18   Temp: 99.3 F (37.4 C) 98.8 F (37.1 C) 98.5 F (36.9 C) 98 F (36.7 C)  TempSrc: Oral     SpO2: 98% 100% 99% 96%  Weight:      Height:        General exam: Middle-age male, moderately built and obese lying comfortably propped up in bed without distress.  Appears to be in good spirits, in shorts and T-shirt. Respiratory system: Clear to auscultation.  No increased work of breathing. Cardiovascular system: S1 & S2 heard, RRR. No JVD, murmurs, rubs, gallops or clicks. No pedal edema.  Off of telemetry now. Gastrointestinal system: Abdomen is nondistended, soft and nontender. No organomegaly or masses felt. Normal bowel sounds heard. Central nervous system: Alert and oriented. No focal neurological deficits. Extremities: Symmetric 5 x 5 power.  Did not notice any tremors. Skin: No rashes, lesions or ulcers Psychiatry: Judgement and insight appear normal. Mood & affect appropriate.  HEENT: Right periorbital and upper maxillary bruising/ecchymosis are steadily decreasing.  Mild right eye temporal subconjunctival hemorrhage-seems to be decreasing. PERTLA    Data Reviewed:   I have personally reviewed following labs and imaging studies   CBC: Recent Labs  Lab 03/11/24 0457 03/12/24 0452 03/13/24 0434  WBC 6.8 5.1 5.9  HGB 10.2* 9.7* 9.5*  HCT 31.3* 30.0* 29.5*  MCV 84.8 84.5 85.5  PLT 217 199 226    Basic Metabolic Panel: Recent Labs  Lab 03/10/24 1330 03/11/24 0457 03/11/24 1054 03/12/24 0452 03/13/24 0434  NA 134* 134*  --  135 136  K  2.8* 2.8*  --  3.1* 3.6  CL 97* 104  --  103 106  CO2 19* 18*  --  20* 21*  GLUCOSE 115* 97  --  90 79  BUN 26* 22  --  18 16  CREATININE 1.99* 1.55*  --  1.17 0.97  CALCIUM 9.6 8.6*  --  8.6* 8.8*  MG  --   --  1.6* 2.0 1.7    Liver Function Tests: Recent Labs  Lab 03/10/24 1330 03/11/24 0457 03/12/24 0452  AST 25 29 22   ALT <5 <5 <5  ALKPHOS 129* 82 75  BILITOT 1.3* 1.3* 0.8  PROT 7.3 6.1* 5.8*  ALBUMIN 4.4 3.4* 3.0*    CBG: Recent Labs  Lab 03/10/24 1331  GLUCAP 119*    Microbiology Studies:  No results found for this or any previous visit (from the past 240 hours).  Radiology Studies:  No results found.   Scheduled Meds:    apixaban  10 mg Oral BID   Followed by   NOREEN ON 03/19/2024] apixaban  5 mg Oral BID   carbidopa -levodopa   1 tablet Oral 6 X Daily   pantoprazole   40 mg Oral Daily    Continuous Infusions:       LOS: 5 days  Trenda Mar, MD,  FACP, [facility], [facility], [facility]   [facility] [place]      To contact the attending provider between 7A-7P or the covering provider during after hours 7P-7A, please log into the web site [email or web address] and access using universal [place] password for that web site. If you do not have the password, please call the hospital operator.  03/15/2024, 8:20 AM

## 2024-03-16 ENCOUNTER — Observation Stay (HOSPITAL_COMMUNITY)
Admission: EM | Admit: 2024-03-16 | Discharge: 2024-03-22 | Disposition: A | Discharge status: Skilled Nursing Facility | Attending: Internal Medicine | Admitting: Internal Medicine

## 2024-03-16 ENCOUNTER — Other Ambulatory Visit: Payer: Self-pay

## 2024-03-16 ENCOUNTER — Emergency Department (HOSPITAL_COMMUNITY)

## 2024-03-16 ENCOUNTER — Encounter (HOSPITAL_COMMUNITY): Payer: Self-pay | Admitting: Pharmacy Technician

## 2024-03-16 DIAGNOSIS — D649 Anemia, unspecified: Secondary | ICD-10-CM | POA: Diagnosis present

## 2024-03-16 DIAGNOSIS — I2699 Other pulmonary embolism without acute cor pulmonale: Secondary | ICD-10-CM | POA: Diagnosis present

## 2024-03-16 DIAGNOSIS — R569 Unspecified convulsions: Secondary | ICD-10-CM

## 2024-03-16 DIAGNOSIS — G20A1 Parkinson's disease without dyskinesia, without mention of fluctuations: Secondary | ICD-10-CM | POA: Diagnosis present

## 2024-03-16 DIAGNOSIS — R55 Syncope and collapse: Secondary | ICD-10-CM | POA: Diagnosis present

## 2024-03-16 DIAGNOSIS — E872 Acidosis, unspecified: Principal | ICD-10-CM | POA: Diagnosis present

## 2024-03-16 DIAGNOSIS — N179 Acute kidney failure, unspecified: Secondary | ICD-10-CM | POA: Diagnosis present

## 2024-03-16 DIAGNOSIS — R000 Tachycardia, unspecified: Secondary | ICD-10-CM

## 2024-03-16 DIAGNOSIS — R296 Repeated falls: Secondary | ICD-10-CM | POA: Diagnosis not present

## 2024-03-16 HISTORY — DX: Acute kidney failure, unspecified: N17.9

## 2024-03-16 HISTORY — DX: Acidosis, unspecified: E87.20

## 2024-03-16 HISTORY — DX: Unspecified convulsions: R56.9

## 2024-03-16 HISTORY — DX: Anemia, unspecified: D64.9

## 2024-03-16 LAB — CBC
HCT: 28.2 % — ABNORMAL LOW (ref 39.0–52.0)
Hemoglobin: 8.9 g/dL — ABNORMAL LOW (ref 13.0–17.0)
MCH: 27 pg (ref 26.0–34.0)
MCHC: 31.6 g/dL (ref 30.0–36.0)
MCV: 85.5 fL (ref 80.0–100.0)
Platelets: 271 K/uL (ref 150–400)
RBC: 3.3 MIL/uL — ABNORMAL LOW (ref 4.22–5.81)
RDW: 14.1 % (ref 11.5–15.5)
WBC: 11.6 K/uL — ABNORMAL HIGH (ref 4.0–10.5)
nRBC: 0 % (ref 0.0–0.2)

## 2024-03-16 LAB — TROPONIN I (HIGH SENSITIVITY)
Troponin I (High Sensitivity): 9 ng/L (ref <18)
Troponin I (High Sensitivity): 14 ng/L (ref <18)

## 2024-03-16 LAB — I-STAT CHEM 8, ED
BUN: 14 mg/dL (ref 8–23)
Calcium, Ion: 1.18 mmol/L (ref 1.15–1.40)
Chloride: 106 mmol/L (ref 98–111)
Creatinine, Ser: 1.3 mg/dL — ABNORMAL HIGH (ref 0.61–1.24)
Glucose, Bld: 100 mg/dL — ABNORMAL HIGH (ref 70–99)
HCT: 26 % — ABNORMAL LOW (ref 39.0–52.0)
Hemoglobin: 8.8 g/dL — ABNORMAL LOW (ref 13.0–17.0)
Potassium: 3.5 mmol/L (ref 3.5–5.1)
Sodium: 136 mmol/L (ref 135–145)
TCO2: 15 mmol/L — ABNORMAL LOW (ref 22–32)

## 2024-03-16 LAB — COMPREHENSIVE METABOLIC PANEL WITH GFR
ALT: 9 U/L (ref 0–44)
AST: 20 U/L (ref 15–41)
Albumin: 3 g/dL — ABNORMAL LOW (ref 3.5–5.0)
Alkaline Phosphatase: 82 U/L (ref 38–126)
Anion gap: 15 (ref 5–15)
BUN: 14 mg/dL (ref 8–23)
CO2: 16 mmol/L — ABNORMAL LOW (ref 22–32)
Calcium: 8.5 mg/dL — ABNORMAL LOW (ref 8.9–10.3)
Chloride: 103 mmol/L (ref 98–111)
Creatinine, Ser: 1.48 mg/dL — ABNORMAL HIGH (ref 0.61–1.24)
GFR, Estimated: 53 mL/min — ABNORMAL LOW (ref >60)
Glucose, Bld: 100 mg/dL — ABNORMAL HIGH (ref 70–99)
Potassium: 3.8 mmol/L (ref 3.5–5.1)
Sodium: 134 mmol/L — ABNORMAL LOW (ref 135–145)
Total Bilirubin: 1.1 mg/dL (ref 0.0–1.2)
Total Protein: 5.6 g/dL — ABNORMAL LOW (ref 6.5–8.1)

## 2024-03-16 LAB — PROTIME-INR
INR: 1.5 — ABNORMAL HIGH (ref 0.8–1.2)
Prothrombin Time: 19.1 s — ABNORMAL HIGH (ref 11.4–15.2)

## 2024-03-16 LAB — URINALYSIS, ROUTINE W REFLEX MICROSCOPIC
Bacteria, UA: NONE SEEN
Bilirubin Urine: NEGATIVE
Glucose, UA: NEGATIVE mg/dL
Ketones, ur: 5 mg/dL — AB
Leukocytes,Ua: NEGATIVE
Nitrite: NEGATIVE
Protein, ur: NEGATIVE mg/dL
Specific Gravity, Urine: 1.018 (ref 1.005–1.030)
pH: 6 (ref 5.0–8.0)

## 2024-03-16 LAB — SAMPLE TO BLOOD BANK

## 2024-03-16 LAB — ETHANOL: Alcohol, Ethyl (B): <15 mg/dL (ref <15)

## 2024-03-16 LAB — I-STAT CG4 LACTIC ACID, ED: Lactic Acid, Venous: 5 mmol/L (ref 0.5–1.9)

## 2024-03-16 LAB — LACTIC ACID, PLASMA
Lactic Acid, Venous: 4.7 mmol/L (ref 0.5–1.9)
Lactic Acid, Venous: 1.2 mmol/L (ref 0.5–1.9)

## 2024-03-16 LAB — BRAIN NATRIURETIC PEPTIDE: B Natriuretic Peptide: 13.5 pg/mL (ref 0.0–100.0)

## 2024-03-16 MED ORDER — CARBIDOPA-LEVODOPA ER 50-200 MG PO TBCR
1.0000 | EXTENDED_RELEASE_TABLET | Freq: Every day | ORAL | Status: DC
  Administered 2024-03-17 – 2024-03-22 (×30): 1 via ORAL
  Filled 2024-03-16 (×40): qty 1

## 2024-03-16 MED ORDER — PROCHLORPERAZINE EDISYLATE 10 MG/2ML IJ SOLN: 5.0000 mg | Freq: Four times a day (QID) | INTRAMUSCULAR | Status: DC | PRN

## 2024-03-16 MED ORDER — APIXABAN 5 MG PO TABS
10.0000 mg | ORAL_TABLET | Freq: Two times a day (BID) | ORAL | Status: DC
  Administered 2024-03-17: 10 mg via ORAL
  Filled 2024-03-16 (×3): qty 2

## 2024-03-16 MED ORDER — SODIUM CHLORIDE 0.9% FLUSH
3.0000 mL | Freq: Two times a day (BID) | INTRAVENOUS | Status: DC
  Administered 2024-03-17 – 2024-03-21 (×11): 3 mL via INTRAVENOUS

## 2024-03-16 MED ORDER — APIXABAN 5 MG PO TABS: ORAL_TABLET | ORAL | Status: DC

## 2024-03-16 MED ORDER — POLYETHYLENE GLYCOL 3350 17 G PO PACK: 17.0000 g | PACK | Freq: Every day | ORAL | Status: DC | PRN

## 2024-03-16 MED ORDER — OXYCODONE HCL 5 MG PO TABS
5.0000 mg | ORAL_TABLET | ORAL | Status: DC | PRN
  Administered 2024-03-17: 10 mg via ORAL
  Administered 2024-03-17: 5 mg via ORAL
  Administered 2024-03-18 (×2): 10 mg via ORAL
  Filled 2024-03-16: qty 1
  Filled 2024-03-16 (×3): qty 2

## 2024-03-16 MED ORDER — ACETAMINOPHEN 325 MG PO TABS: 650.0000 mg | ORAL_TABLET | Freq: Four times a day (QID) | ORAL | Status: DC | PRN

## 2024-03-16 MED ORDER — IOHEXOL 350 MG/ML SOLN
55.0000 mL | Freq: Once | INTRAVENOUS | Status: AC | PRN
  Administered 2024-03-16: 55 mL via INTRAVENOUS

## 2024-03-16 MED ORDER — ADULT MULTIVITAMIN W/MINERALS CH
1.0000 | ORAL_TABLET | Freq: Every day | ORAL | Status: DC
  Administered 2024-03-17 – 2024-03-22 (×7): 1 via ORAL
  Filled 2024-03-16 (×7): qty 1

## 2024-03-16 MED ORDER — SODIUM CHLORIDE 0.9 % IV SOLN: INTRAVENOUS | Status: DC

## 2024-03-16 MED ORDER — PANTOPRAZOLE SODIUM 40 MG PO TBEC
40.0000 mg | DELAYED_RELEASE_TABLET | Freq: Every day | ORAL | Status: DC
  Administered 2024-03-17 – 2024-03-22 (×6): 40 mg via ORAL
  Filled 2024-03-16 (×6): qty 1

## 2024-03-16 MED ORDER — APIXABAN 5 MG PO TABS: 5.0000 mg | ORAL_TABLET | Freq: Two times a day (BID) | ORAL | Status: DC

## 2024-03-16 MED ORDER — ACETAMINOPHEN 500 MG PO TABS: 500.0000 mg | ORAL_TABLET | Freq: Four times a day (QID) | ORAL | Status: AC | PRN

## 2024-03-16 MED ORDER — VITAMIN B-12 1000 MCG PO TABS
1000.0000 ug | ORAL_TABLET | Freq: Every day | ORAL | Status: DC
  Administered 2024-03-17 – 2024-03-22 (×7): 1000 ug via ORAL
  Filled 2024-03-16 (×7): qty 1

## 2024-03-16 MED ORDER — LACTATED RINGERS IV BOLUS
1000.0000 mL | Freq: Once | INTRAVENOUS | Status: AC
  Administered 2024-03-16: 1000 mL via INTRAVENOUS

## 2024-03-16 MED ORDER — ACETAMINOPHEN 650 MG RE SUPP: 650.0000 mg | Freq: Four times a day (QID) | RECTAL | Status: DC | PRN

## 2024-03-16 NOTE — Plan of Care (Signed)

## 2024-03-16 NOTE — Discharge Summary (Signed)
 Physician Discharge Summary  John [name] FMW:[id number] DOB: [birth year]  PCP: [name], MD  Admitted from: Home Discharged to: SNF  Admit date: 03/10/2024 Discharge date: 03/16/2024  Recommendations for Outpatient Follow-up:    Follow-up Information     MD at SNF. Schedule an appointment as soon as possible for a visit.   Why: To be seen in 3 to 4 days with repeat labs (CBC & BMP).  Consider outpatient hematology consultation for evaluation of hypercoagulable state.        [name], MD. Schedule an appointment as soon as possible for a visit.   Specialty: Family Medicine Why: To be seen upon discharge from SNF. Contact information: [address] [place] [address] [phone]         Skeet Juliene SAUNDERS, DO. Schedule an appointment as soon as possible for a visit.   Specialty: Neurology Contact information: [address]  [address] [place] [address] [phone]                  Home Health: None    Equipment/Devices: TBD at SNF    Discharge Condition: Improved and stable.   Code Status: Full Code Diet recommendation:  Discharge Diet Orders (From admission, onward)     Start     Ordered   03/16/24 0000  Diet - low sodium heart healthy        03/16/24 1039             Discharge Diagnoses:  Principal Problem:   Acute pulmonary embolism (HCC) Active Problems:   Essential hypertension   Gastroesophageal reflux disease without esophagitis   Parkinson's disease with fluctuating manifestations (HCC)   ARF (acute renal failure) (HCC)   Hypokalemia   Hyponatremia   Maxillary fracture (HCC)   Anemia   Brief Hospital Course:  63 year old male, lives alone, has a cane and walker but only uses the cane, mostly sedentary, medical history significant for Parkinson's disease, HTN, presented to the ED due to recurrent falls including falling face forward the last time and family noted patient to be confused.  He  also reported getting shortness of breath with exertion for last few weeks.  In ED, tachycardic, hypotensive which improved after IV fluid bolus, CT C/A/P with contrast showed bilateral PE with right heart strain, right maxillary sinus and inferior orbital fractures, rib fractures and S3 fracture.  Admitted for acute pulmonary embolism, gait ataxia with frequent falls and multiple fractures as noted above, acute kidney injury and hypokalemia.     Assessment & Plan:    Acute pulmonary embolism without cor pulmonale CT C/A/P with contrast 6/25: Bilateral lobar and segmental pulmonary artery emboli with right heart straining. Hemodynamically stable Lower extremity venous Dopplers and DVT ruled out. 2D echo: LVEF 60-65%, grade 1 diastolic dysfunction, RV size and function normal. Long discussion with patient that it can be dangerous if he is on anticoagulation and continues to have frequent falls which could lead to life-threatening bleeding complications.  He verbalized understanding.   Transitioned from IV heparin  to Eliquis 6/27.  Continue.  Stable. This appears to be an unprovoked PE.  Could consider outpatient hematology consultation for further evaluation of hypercoagulable state.  Duration of anticoagulation at least 3 months and then can reassess regarding further need for continued AC.  Confounding factor will be his fall risk for continued anticoagulation.   Frequent falls with multiple injuries as noted below (right maxillary fracture, right inferior and medial orbital wall fracture,  right 5-6 rib fracture, S3 fracture): CT maxillofacial 6/25: Acute mildly displaced fracture of the right maxillary sinus.  Acute nondisplaced fractures of the inferior rim and medial wall of the right orbit. CT head and C-spine: No acute abnormality CT C/A/P with contrast: Nondisplaced fracture through posterior elements of S3.  Fracture of anterior right 5th and 6th ribs, likely subacute or old. Concerned that  this may be related to his Parkinson's disease with gait instability Orthostatic vitals negative. 6/26: Discussed with Dr. Elspeth Coddington, ENT who reviewed patient's imaging studies and indicated that the above facial fractures can be managed nonoperatively, patient was advised that he should not blow his nose and outpatient follow-up in the office with him in a week's time. PT has seen and recommend SNF.     Had a long discussion with patient's sister via phone on 6/27.  She stated that patient has been having unsteady gait for more than a year, progressively worsening with frequent falls.  She does not think that he is able to care for himself.  We agreed that after short-term rehab at [facility] ([facility]), depending on how he does, he will either need home 24/7 assistance and supervision which if not available then may need LTC.   Acute kidney injury Suspected due to dehydration Creatinine on 12/10/2023 was 1.09.  Presented with creatinine of 1.99. Resolved with IV fluids.   Dehydration with hyponatremia Resolved with IV fluids.  Encourage oral intake.   Hypokalemia Replaced.   Hypomagnesemia Replaced.   Acute metabolic encephalopathy May be due to AKI, dehydration with hyponatremia Transient and resolved by the time he presented to ED. Delirium precautions.   Parkinson's disease Follows with Dr. Skeet, [facility] neurology. Continue levodopa    Anemia Relatively stable.  Follow CBC periodically as outpatient.   GERD PPI   Body mass index is 33.97 kg/m.      Consultants:       Procedures:       Discharge Instructions  Discharge Instructions     Call MD for:  difficulty breathing, headache or visual disturbances   Complete by: As directed    Call MD for:  extreme fatigue   Complete by: As directed    Call MD for:  persistant dizziness or light-headedness   Complete by: As directed    Call MD for:  severe uncontrolled pain   Complete by: As directed    Diet - low sodium heart  healthy   Complete by: As directed    Increase activity slowly   Complete by: As directed    No wound care   Complete by: As directed         Medication List     STOP taking these medications    ibuprofen 200 MG tablet Commonly known as: ADVIL   losartan -hydrochlorothiazide 50-12.5 MG tablet Commonly known as: HYZAAR   tamsulosin  0.4 MG Caps capsule Commonly known as: FLOMAX        TAKE these medications    acetaminophen  500 MG tablet Commonly known as: TYLENOL  Take 1 tablet (500 mg total) by mouth every 6 (six) hours as needed for mild pain (pain score 1-3), moderate pain (pain score 4-6) or headache. What changed: reasons to take this   apixaban 5 MG Tabs tablet Commonly known as: ELIQUIS Take 2 tabs (10 mg total) twice a day through 03/18/2024.  From 03/19/2024, start taking 1 tab (5 Mg total) twice daily.   carbidopa -levodopa  50-200 MG tablet Commonly known as: SINEMET  CR Take 1 tablet by  mouth 6 (six) times daily. (4-5 AM, 8 AM, 11 AM, 2 PM, 5 PM, 8 PM).   omeprazole  20 MG capsule Commonly known as: PRILOSEC  Take 1 capsule (20 mg total) by mouth daily.       No Known Allergies    Procedures/Studies: VAS US  LOWER EXTREMITY VENOUS (DVT) Result Date: 03/11/2024  Lower Venous DVT Study Patient Name:  John [name]  Date of Exam:   03/11/2024 Medical Rec #: [id number]         Accession #:    [id number] Date of Birth: 1960/10/16        Patient Gender: M Patient Age:   74 years Exam Location:  [facility] Procedure:      VAS US  LOWER EXTREMITY VENOUS (DVT) Referring Phys: Vernal Hritz --------------------------------------------------------------------------------  Indications: Pulmonary embolism.  Comparison Study: Previous exam 03/20/2021 was negative for DVT Performing Technologist: Ezzie Potters RVT, RDMS  Examination Guidelines: A complete evaluation includes B-mode imaging, spectral Doppler, color Doppler, and power Doppler as needed of all accessible  portions of each vessel. Bilateral testing is considered an integral part of a complete examination. Limited examinations for reoccurring indications may be performed as noted. The reflux portion of the exam is performed with the patient in reverse Trendelenburg.  +---------+---------------+---------+-----------+----------+--------------+ RIGHT    CompressibilityPhasicitySpontaneityPropertiesThrombus Aging +---------+---------------+---------+-----------+----------+--------------+ CFV      Full           Yes      Yes                                 +---------+---------------+---------+-----------+----------+--------------+ SFJ      Full                                                        +---------+---------------+---------+-----------+----------+--------------+ FV Prox  Full           Yes      Yes                                 +---------+---------------+---------+-----------+----------+--------------+ FV Mid   Full           Yes      Yes                                 +---------+---------------+---------+-----------+----------+--------------+ FV DistalFull           Yes      Yes                                 +---------+---------------+---------+-----------+----------+--------------+ PFV      Full                                                        +---------+---------------+---------+-----------+----------+--------------+ POP      Full           Yes      Yes                                 +---------+---------------+---------+-----------+----------+--------------+  PTV      Full                                                        +---------+---------------+---------+-----------+----------+--------------+ PERO     Full                                                        +---------+---------------+---------+-----------+----------+--------------+   +---------+---------------+---------+-----------+----------+--------------+ LEFT      CompressibilityPhasicitySpontaneityPropertiesThrombus Aging +---------+---------------+---------+-----------+----------+--------------+ CFV      Full           Yes      Yes                                 +---------+---------------+---------+-----------+----------+--------------+ SFJ      Full                                                        +---------+---------------+---------+-----------+----------+--------------+ FV Prox  Full           Yes      Yes                                 +---------+---------------+---------+-----------+----------+--------------+ FV Mid   Full           Yes      Yes                                 +---------+---------------+---------+-----------+----------+--------------+ FV DistalFull           Yes      Yes                                 +---------+---------------+---------+-----------+----------+--------------+ PFV      Full                                                        +---------+---------------+---------+-----------+----------+--------------+ POP      Full           Yes      Yes                                 +---------+---------------+---------+-----------+----------+--------------+ PTV      Full                                                        +---------+---------------+---------+-----------+----------+--------------+  PERO     Full                                                        +---------+---------------+---------+-----------+----------+--------------+     Summary: BILATERAL: - No evidence of deep vein thrombosis seen in the lower extremities, bilaterally. -No evidence of popliteal cyst, bilaterally.   *See table(s) above for measurements and observations. Electronically signed by Debby Robertson on 03/11/2024 at 9:42:33 PM.    Final    ECHOCARDIOGRAM COMPLETE Result Date: 03/11/2024    ECHOCARDIOGRAM REPORT   Patient Name:   John [name] Date of Exam: 03/11/2024 Medical Rec #:   [id number]        Height:       74.0 in Accession #:    [id number]       Weight:       264.6 lb Date of Birth:  October 05, 1960       BSA:          2.448 m Patient Age:    62 years         BP:           139/78 mmHg Patient Gender: M                HR:           75 bpm. Exam Location:  Inpatient Procedure: 2D Echo, Cardiac Doppler and Color Doppler (Both Spectral and Color            Flow Doppler were utilized during procedure). Indications:     Pulmonary Embolus I26.09  History:         Patient has no prior history of Echocardiogram examinations.                  Stroke; Risk Factors:Hypertension.  Sonographer:     Jayson Gaskins Referring Phys:  REDIA LOISE CLEAVER Diagnosing Phys: Salena Negri MD IMPRESSIONS  1. Left ventricular ejection fraction, by estimation, is 60 to 65%. The left ventricle has normal function. The left ventricle has no regional wall motion abnormalities. Left ventricular diastolic parameters are consistent with Grade I diastolic dysfunction (impaired relaxation).  2. Right ventricular systolic function is normal. The right ventricular size is normal.  3. Left atrial size was mildly dilated.  4. Right atrial size was mildly dilated.  5. There is no evidence of cardiac tamponade.  6. The mitral valve is grossly normal. Trivial mitral valve regurgitation.  7. The aortic valve is tricuspid. Aortic valve regurgitation is not visualized.  8. The inferior vena cava is normal in size with greater than 50% respiratory variability, suggesting right atrial pressure of 3 mmHg. FINDINGS  Left Ventricle: Left ventricular ejection fraction, by estimation, is 60 to 65%. The left ventricle has normal function. The left ventricle has no regional wall motion abnormalities. The left ventricular internal cavity size was normal in size. There is  no left ventricular hypertrophy. Left ventricular diastolic parameters are consistent with Grade I diastolic dysfunction (impaired relaxation). Right Ventricle: The right  ventricular size is normal. No increase in right ventricular wall thickness. Right ventricular systolic function is normal. Left Atrium: Left atrial size was mildly dilated. Right Atrium: Right atrial size was mildly dilated. Pericardium: Trivial pericardial effusion is present. The pericardial effusion is circumferential. There is no evidence of  cardiac tamponade. Mitral Valve: The mitral valve is grossly normal. Trivial mitral valve regurgitation. Tricuspid Valve: The tricuspid valve is normal in structure. Tricuspid valve regurgitation is trivial. Aortic Valve: The aortic valve is tricuspid. Aortic valve regurgitation is not visualized. Aortic valve mean gradient measures 3.0 mmHg. Aortic valve peak gradient measures 4.8 mmHg. Aortic valve area, by VTI measures 2.77 cm. Pulmonic Valve: The pulmonic valve was normal in structure. Pulmonic valve regurgitation is trivial. Aorta: The aortic root is normal in size and structure. Venous: The inferior vena cava was not well visualized. The inferior vena cava is normal in size with greater than 50% respiratory variability, suggesting right atrial pressure of 3 mmHg. IAS/Shunts: The atrial septum is grossly normal.  LEFT VENTRICLE PLAX 2D LVIDd:         5.00 cm   Diastology LVIDs:         3.10 cm   LV e' medial:    8.81 cm/s LV PW:         0.90 cm   LV E/e' medial:  7.6 LV IVS:        0.90 cm   LV e' lateral:   8.05 cm/s LVOT diam:     2.00 cm   LV E/e' lateral: 8.3 LV SV:         61 LV SV Index:   25 LVOT Area:     3.14 cm  RIGHT VENTRICLE RV S prime:     17.10 cm/s TAPSE (M-mode): 2.6 cm LEFT ATRIUM             Index        RIGHT ATRIUM           Index LA Vol (A2C):   35.4 ml 14.46 ml/m  RA Area:     13.70 cm LA Vol (A4C):   41.4 ml 16.91 ml/m  RA Volume:   32.00 ml  13.07 ml/m LA Biplane Vol: 39.9 ml 16.30 ml/m  AORTIC VALVE AV Area (Vmax):    2.83 cm AV Area (Vmean):   2.47 cm AV Area (VTI):     2.77 cm AV Vmax:           110.00 cm/s AV Vmean:           83.400 cm/s AV VTI:            0.219 m AV Peak Grad:      4.8 mmHg AV Mean Grad:      3.0 mmHg LVOT Vmax:         99.00 cm/s LVOT Vmean:        65.700 cm/s LVOT VTI:          0.193 m LVOT/AV VTI ratio: 0.88  AORTA Ao Root diam: 2.90 cm MITRAL VALVE MV Area (PHT): 2.93 cm    SHUNTS MV Decel Time: 259 msec    Systemic VTI:  0.19 m MV E velocity: 67.10 cm/s  Systemic Diam: 2.00 cm MV A velocity: 61.30 cm/s MV E/A ratio:  1.09 Salena Negri MD Electronically signed by Salena Negri MD Signature Date/Time: 03/11/2024/10:44:04 AM    Final    CT Maxillofacial Wo Contrast Result Date: 03/10/2024 CLINICAL DATA:  Facial trauma, blunt. Multiple recent falls. Partially visualized right maxillary sinus fracture on head CT today. EXAM: CT MAXILLOFACIAL WITHOUT CONTRAST TECHNIQUE: Multidetector CT imaging of the maxillofacial structures was performed. Multiplanar CT image reconstructions were also generated. RADIATION DOSE REDUCTION: This exam was performed according to the departmental dose-optimization program which  includes automated exposure control, adjustment of the mA and/or kV according to patient size and/or use of iterative reconstruction technique. COMPARISON:  Head CT earlier today.  Maxillofacial CT 05/19/2021. FINDINGS: Osseous: Acute, mildly displaced fractures of the anterior and posterior/lateral walls of the right maxillary sinus with a small amount gas in the adjacent soft tissues. Acute nondisplaced fractures of the inferior right orbital rim and medial wall the right orbit. Unchanged mildly depressed fracture of the right orbital floor more posteriorly. No mandibular dislocation or destructive process. Orbits: Mild right periorbital soft tissue swelling. No retrobulbar hematoma. Grossly intact globes. Sinuses: Right maxillary hemosinus. Clear mastoid air cells and middle ear cavities. Soft tissues: Right facial contusion. Limited intracranial: More fully evaluated on today's earlier head CT. IMPRESSION: 1.  Acute mildly displaced fractures of the right maxillary sinus. 2. Acute nondisplaced fractures of the inferior rim and medial wall of the right orbit. Electronically Signed   By: Dasie Hamburg M.D.   On: 03/10/2024 17:08   CT CHEST ABDOMEN PELVIS W CONTRAST Result Date: 03/10/2024 CLINICAL DATA:  Polytrauma. EXAM: CT CHEST, ABDOMEN, AND PELVIS WITH CONTRAST TECHNIQUE: Multidetector CT imaging of the chest, abdomen and pelvis was performed following the standard protocol during bolus administration of intravenous contrast. RADIATION DOSE REDUCTION: This exam was performed according to the departmental dose-optimization program which includes automated exposure control, adjustment of the mA and/or kV according to patient size and/or use of iterative reconstruction technique. CONTRAST:  OMNIPAQUE  IOHEXOL  300 MG/ML  SOLN COMPARISON:  None Available. FINDINGS: CT CHEST FINDINGS Cardiovascular: There is no cardiomegaly. Small pericardial effusion measuring 12 mm anterior to the heart. Mild atherosclerotic calcification of the thoracic aorta. No intimal dilatation or dissection. The origins of the great vessels of the aortic arch appear patent. Bilateral lobar and segmental pulmonary artery emboli. The RV/LV ratio is approximately 1 indicative of right heart straining. Mediastinum/Nodes: No hilar or mediastinal adenopathy. The esophagus and the thyroid gland are grossly unremarkable. No mediastinal fluid collection. Lungs/Pleura: No focal consolidation, pleural effusion, pneumothorax. The central airways are patent. Musculoskeletal: Old healed left posterior rib fractures. Fractures of the anterior right fifth and sixth ribs, likely subacute or old. Correlation with clinical exam and point tenderness recommended. There is osteopenia with degenerative changes of the spine. No definite acute osseous pathology. CT ABDOMEN PELVIS FINDINGS No intra-abdominal free air or free fluid. Hepatobiliary: Small liver cyst. No  medial tension. The gallbladder is unremarkable. Pancreas: Unremarkable. No pancreatic ductal dilatation or surrounding inflammatory changes. Spleen: Normal in size without focal abnormality. Adrenals/Urinary Tract: The adrenal glands unremarkable. There is no hydronephrosis on either side. Small exophytic left renal cyst. There is symmetric enhancement and excretion of contrast by both kidneys. The visualized ureters and urinary bladder appear unremarkable. Stomach/Bowel: There is no bowel obstruction or active inflammation. The appendix is normal. Vascular/Lymphatic: The abdominal aorta and IVC unremarkable. No portal venous gas. There is no adenopathy. Reproductive: The prostate and seminal vesicles are grossly remarkable. No pelvic mass. Other: Small fat containing umbilical and right inguinal hernia. Musculoskeletal: Osteopenia with degenerative changes of the spine. There is nondisplaced fracture through the posterior elements of S3 (89/6 and 22/5). IMPRESSION: 1. Bilateral lobar and segmental pulmonary artery emboli with right heart straining. Correlation with EKG and troponin levels recommended. 2. Nondisplaced fracture through the posterior elements of S3. 3. Fractures of the anterior right fifth and sixth ribs, likely subacute or old. Correlation with clinical exam and point tenderness recommended. 4. No bowel obstruction. Normal appendix.  5.  Aortic Atherosclerosis (ICD10-I70.0). These results were called by telephone at the time of interpretation on 03/10/2024 at 3:27 pm to provider [facility] , who verbally acknowledged these results. Electronically Signed   By: Vanetta Chou M.D.   On: 03/10/2024 15:29   CT Head Wo Contrast Result Date: 03/10/2024 CLINICAL DATA:  Polytrauma, blunt falls, parkinsons; Polytrauma, blunt. Multiple falls in the past 2 days. Confusion. History of Parkinson's disease. EXAM: CT HEAD WITHOUT CONTRAST CT CERVICAL SPINE WITHOUT CONTRAST TECHNIQUE: Multidetector CT  imaging of the head and cervical spine was performed following the standard protocol without intravenous contrast. Multiplanar CT image reconstructions of the cervical spine were also generated. RADIATION DOSE REDUCTION: This exam was performed according to the departmental dose-optimization program which includes automated exposure control, adjustment of the mA and/or kV according to patient size and/or use of iterative reconstruction technique. COMPARISON:  CT head 05/19/2021 FINDINGS: CT HEAD FINDINGS Brain: There is no evidence of an acute infarct, intracranial hemorrhage, mass, midline shift, or extra-axial fluid collection. There is mild cerebral atrophy. Mild chronic small vessel ischemia is again noted with an unchanged chronic lacunar infarct at the anterior aspect of the right basal ganglia. Vascular: No hyperdense vessel. Skull: No acute calvarial fracture or suspicious lesion. Sinuses/Orbits: Partially visualized minimally displaced fracture of the posterolateral wall of the right maxillary sinus with regional soft tissue gas and hemorrhage layering in the sinus. Old right orbital floor fracture. Clear mastoid air cells. Other: Partially visualized right facial soft tissue swelling. CT CERVICAL SPINE FINDINGS Alignment: Cervical spine straightening. Trace anterolisthesis of C4 on C5. Slight left convex curvature of the cervical spine. Skull base and vertebrae: No acute fracture or suspicious lesion. Soft tissues and spinal canal: No prevertebral fluid or swelling. No visible canal hematoma. Disc levels: Diffuse cervical spondylosis including prominent anterior vertebral osteophytes from C3-C7. 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 Moderate multilevel facet arthrosis. Upper chest: Clear lung apices. Other: None. IMPRESSION: 1. No evidence of  acute intracranial abnormality or cervical spine fracture. 2. Partially visualized acute right maxillary sinus fracture. A maxillofacial CT is recommended to allow full characterization and assessment for any additional facial fractures. Electronically Signed   By: Dasie Hamburg M.D.   On: 03/10/2024 15:17   CT Cervical Spine Wo Contrast Result Date: 03/10/2024 CLINICAL DATA:  Polytrauma, blunt falls, parkinsons; Polytrauma, blunt. Multiple falls in the past 2 days. Confusion. History of Parkinson's disease. EXAM: CT HEAD WITHOUT CONTRAST CT CERVICAL SPINE WITHOUT CONTRAST TECHNIQUE: Multidetector CT imaging of the head and cervical spine was performed following the standard protocol without intravenous contrast. Multiplanar CT image reconstructions of the cervical spine were also generated. RADIATION DOSE REDUCTION: This exam was performed according to the departmental dose-optimization program which includes automated exposure control, adjustment of the mA and/or kV according to patient size and/or use of iterative reconstruction technique. COMPARISON:  CT head 05/19/2021 FINDINGS: CT HEAD FINDINGS Brain: There is no evidence of an acute infarct, intracranial hemorrhage, mass, midline shift, or extra-axial fluid collection. There is mild cerebral atrophy. Mild chronic small vessel ischemia is again noted with an unchanged chronic lacunar infarct at the anterior aspect of the right basal ganglia. Vascular: No hyperdense vessel. Skull: No acute calvarial fracture or suspicious lesion. Sinuses/Orbits: Partially visualized minimally displaced fracture of the posterolateral wall of the right maxillary sinus with regional soft tissue gas  and hemorrhage layering in the sinus. Old right orbital floor fracture. Clear mastoid air cells. Other: Partially visualized right facial soft tissue swelling. CT CERVICAL SPINE FINDINGS Alignment: Cervical spine straightening. Trace anterolisthesis of C4 on C5. Slight left convex  curvature of the cervical spine. Skull base and vertebrae: No acute fracture or suspicious lesion. Soft tissues and spinal canal: No prevertebral fluid or swelling. No visible canal hematoma. Disc levels: Diffuse cervical spondylosis including prominent anterior vertebral osteophytes from C3-C7. 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 Moderate multilevel facet arthrosis. Upper chest: Clear lung apices. Other: None. IMPRESSION: 1. No evidence of acute intracranial abnormality or cervical spine fracture. 2. Partially visualized acute right maxillary sinus fracture. A maxillofacial CT is recommended to allow full characterization and assessment for any additional facial fractures. Electronically Signed   By: Dasie Hamburg M.D.   On: 03/10/2024 15:17   DG Shoulder Right Result Date: 03/10/2024 CLINICAL DATA:  Fall EXAM: RIGHT SHOULDER - 2+ VIEW COMPARISON:  None Available. FINDINGS: Advanced osteoarthritis in the right [facility] joint with joint space narrowing and spurring. Glenohumeral joint is slightly narrowed otherwise intact. No acute bony abnormality. Specifically, no fracture, subluxation, or dislocation. 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 No acute bony abnormality. Electronically Signed   By: Franky Crease M.D.   On: 03/10/2024 15:10   CT KNEE LEFT WO CONTRAST Result Date: 02/16/2024 CLINICAL DATA:  Acute left knee pain. Evaluate for patellar fracture. Multiple falls. EXAM: CT OF THE LEFT KNEE WITHOUT CONTRAST TECHNIQUE: Multidetector CT imaging of the left knee was performed according to the standard protocol. Multiplanar CT image reconstructions were also generated. RADIATION DOSE REDUCTION: This exam was performed according to the departmental dose-optimization program which includes automated exposure control, adjustment of the mA  and/or kV according to patient size and/or use of iterative reconstruction technique. COMPARISON:  None Available. FINDINGS: Bones/Joint/Cartilage No evidence of acute fracture or dislocation. The patella is intact. There is heterotopic ossification surrounding the proximal fibula with ankylosis of the proximal tibiofibular joint. The tibia femoral joint spaces are preserved. No significant joint effusion or lipohemarthrosis. Ligaments Suboptimally assessed by CT. The cruciate ligaments are grossly intact. Muscles and Tendons There is spurring at the quadriceps and patellar tendon insertions on the patella. No CT evidence of acute tendon tear. Fatty atrophy noted within the peroneus musculature in the proximal lower leg. Soft tissues Nonspecific prepatellar subcutaneous edema. No focal fluid collection, foreign body or soft tissue emphysema. IMPRESSION: 1. No evidence of acute fracture or dislocation. The patella is intact. 2. Heterotopic ossification surrounding the proximal fibula with ankylosis of the proximal tibiofibular joint, likely posterior. Fatty atrophy noted within the peroneus musculature may be related. 3. Nonspecific prepatellar subcutaneous edema. No focal fluid collection, foreign body or soft tissue emphysema. Electronically Signed   By: Elsie Perone M.D.   On: 02/16/2024 15:48      Subjective: Denies complaints. Tired of being in bed. Wants to discharge preferably to home. Advised him that would not be a safe option currently, awaiting SNF and he is agreeable. Appears to be impulsive and attempts to get out of bed by himself, advised strongly against it that he should not get up without nursing assistance.  Discharge Exam:  Vitals:   03/15/24 1725 03/15/24 2012 03/16/24 0454 03/16/24 0749  BP: 123/78 115/77 120/89 114/72  Pulse: 91 86 98  81  Resp: 18 18 18 18   Temp: 98.2 F (36.8 C) 98.4 F (36.9 C) 98.6 F (37 C) 98.3 F (36.8 C)  TempSrc:    Oral  SpO2: 98% 96% 97% 97%   Weight:      Height:        General exam: Middle-age male, moderately built and obese sitting up comfortably at edge of bed. Respiratory system: Clear to auscultation.  No increased work of breathing. Cardiovascular system: S1 & S2 heard, RRR. No JVD, murmurs, rubs, gallops or clicks. No pedal edema.  Off of telemetry now. Gastrointestinal system: Abdomen is nondistended, soft and nontender. No organomegaly or masses felt. Normal bowel sounds heard. Central nervous system: Alert and oriented. No focal neurological deficits. Extremities: Symmetric 5 x 5 power.  Have not noticed any tremors since this hospital admission. Skin: No rashes, lesions or ulcers Psychiatry: Judgement and insight appear impaired. Mood & affect appropriate.  HEENT: Right periorbital and upper maxillary bruising/ecchymosis are steadily decreasing.  Also has some old ecchymosis on the anterior right side of the neck.  Mild right eye temporal subconjunctival hemorrhage- decreasing. PERTLA    The results of significant diagnostics from this hospitalization (including imaging, microbiology, ancillary and laboratory) are listed below for reference.     Microbiology: No results found for this or any previous visit (from the past 240 hours).   Labs: CBC: Recent Labs  Lab 03/10/24 1330 03/11/24 0457 03/12/24 0452 03/13/24 0434  WBC 11.0* 6.8 5.1 5.9  HGB 11.2* 10.2* 9.7* 9.5*  HCT 33.2* 31.3* 30.0* 29.5*  MCV 83.0 84.8 84.5 85.5  PLT 250 217 199 226    Basic Metabolic Panel: Recent Labs  Lab 03/10/24 1330 03/11/24 0457 03/11/24 1054 03/12/24 0452 03/13/24 0434  NA 134* 134*  --  135 136  K 2.8* 2.8*  --  3.1* 3.6  CL 97* 104  --  103 106  CO2 19* 18*  --  20* 21*  GLUCOSE 115* 97  --  90 79  BUN 26* 22  --  18 16  CREATININE 1.99* 1.55*  --  1.17 0.97  CALCIUM 9.6 8.6*  --  8.6* 8.8*  MG  --   --  1.6* 2.0 1.7    Liver Function Tests: Recent Labs  Lab 03/10/24 1330 03/11/24 0457  03/12/24 0452  AST 25 29 22   ALT <5 <5 <5  ALKPHOS 129* 82 75  BILITOT 1.3* 1.3* 0.8  PROT 7.3 6.1* 5.8*  ALBUMIN 4.4 3.4* 3.0*    CBG: Recent Labs  Lab 03/10/24 1331  GLUCAP 119*     Urinalysis    Component Value Date/Time   COLORURINE YELLOW 03/10/2024 1330   APPEARANCEUR CLEAR 03/10/2024 1330   LABSPEC >1.046 (H) 03/10/2024 1330   LABSPEC 1.010 02/10/2024 1448   PHURINE 6.5 03/10/2024 1330   GLUCOSEU NEGATIVE 03/10/2024 1330   HGBUR MODERATE (A) 03/10/2024 1330   BILIRUBINUR NEGATIVE 03/10/2024 1330   BILIRUBINUR negative 02/10/2024 1448   KETONESUR 15 (A) 03/10/2024 1330   PROTEINUR TRACE (A) 03/10/2024 1330   NITRITE NEGATIVE 03/10/2024 1330   LEUKOCYTESUR NEGATIVE 03/10/2024 1330      Time coordinating discharge: 35 minutes  SIGNED:  Trenda Mar, MD,  FACP, [facility], [facility], [facility]   [facility] [place]     To contact the attending provider between 7A-7P or the covering provider during after hours 7P-7A, please log into the web site [email or web address] and access using universal [place] password  for that web site. If you do not have the password, please call the hospital operator.

## 2024-03-16 NOTE — Progress Notes (Signed)
 Physical Therapy Treatment Patient Details Name: John [name] MRN: [id number] DOB: 03-Jul-1961 Today's Date: 03/16/2024   History of Present Illness Pt is a 62 y.o. male admitted 6/25 for frequent falls and AMS. CT showed pulmonary embolism, fxs to the R maxillary sinus & orbit, ribs 5-6 fxs.  PMH: parkinson's, HTN, CVA    PT Comments  Pt agreeable to participate in physical therapy session. PT assisted with donning shoes on edge of bed. Pt requiring min assist for transfers. Pt ambulating in hallway with RW with cues for larger step lengths. Pt reports feeling irritant in shoe and attempts to lift up foot and remove hand from RW to remove. PT instructed pt to stop and return to room to address while in sitting position, however, when pt placed his foot back down, he experienced a gross loss of balance laterally. PT assisted to ground onto bottom with gait belt. Pt reporting no pain and VSS. Utilized Maxi move to lift off ground to chair and then performed step pivot back to bed. RN/MD notified.    If plan is discharge home, recommend the following: Assistance with cooking/housework;Direct supervision/assist for medications management;Assist for transportation;Help with stairs or ramp for entrance;A lot of help with walking and/or transfers;A lot of help with bathing/dressing/bathroom   Can travel by private vehicle     Yes  Equipment Recommendations  Other (comment) (defer)    Recommendations for Other Services       Precautions / Restrictions Precautions Precautions: Fall Recall of Precautions/Restrictions: Impaired Precaution/Restrictions Comments: poor safety awareness Restrictions Weight Bearing Restrictions Per Provider Order: No     Mobility  Bed Mobility Overal bed mobility: Needs Assistance Bed Mobility: Supine to Sit, Sit to Supine     Supine to sit: Min assist Sit to supine: Contact guard assist   General bed mobility comments: Pt seeking HHA to elevate trunk.  Able to return self to bed without physical assist    Transfers Overall transfer level: Needs assistance Equipment used: Rolling walker (2 wheels) Transfers: Sit to/from Stand, Bed to chair/wheelchair/BSC Sit to Stand: Contact guard assist Stand pivot transfers: Contact guard assist         General transfer comment: Cues for hand placement    Ambulation/Gait Ambulation/Gait assistance: Mod assist, Max assist Gait Distance (Feet): 50 Feet Assistive device: Rolling walker (2 wheels) Gait Pattern/deviations: Step-through pattern, Shuffle, Trunk flexed, Festinating, Decreased stride length Gait velocity: decreased     General Gait Details: Pt requiring modA for navigation in hallway, with cues for larger step length and increased foot clearance.   Stairs             Wheelchair Mobility     Tilt Bed    Modified Rankin (Stroke Patients Only)       Balance Overall balance assessment: Needs assistance Sitting-balance support: No upper extremity supported, Feet supported Sitting balance-Leahy Scale: Fair     Standing balance support: Bilateral upper extremity supported, During functional activity, Reliant on assistive device for balance Standing balance-Leahy Scale: Poor                              Communication Communication Communication: No apparent difficulties  Cognition Arousal: Alert Behavior During Therapy: WFL for tasks assessed/performed   PT - Cognitive impairments: Safety/Judgement, Problem solving, Awareness                       PT - Cognition Comments:  Poor safety awareness Following commands: Intact Following commands impaired: Follows one step commands with increased time, Follows multi-step commands with increased time    Cueing Cueing Techniques: Verbal cues  Exercises      General Comments        Pertinent Vitals/Pain Pain Assessment Pain Assessment: Faces Faces Pain Scale: No hurt    Home Living                           Prior Function            PT Goals (current goals can now be found in the care plan section) Acute Rehab PT Goals Potential to Achieve Goals: Good    Frequency    Min 3X/week      PT Plan      Co-evaluation              AM-PAC PT 6 Clicks Mobility   Outcome Measure  Help needed turning from your back to your side while in a flat bed without using bedrails?: A Little Help needed moving from lying on your back to sitting on the side of a flat bed without using bedrails?: A Little Help needed moving to and from a bed to a chair (including a wheelchair)?: A Little Help needed standing up from a chair using your arms (e.g., wheelchair or bedside chair)?: A Little Help needed to walk in hospital room?: A Lot Help needed climbing 3-5 steps with a railing? : Total 6 Click Score: 15    End of Session Equipment Utilized During Treatment: Gait belt Activity Tolerance: Patient tolerated treatment well Patient left: in bed;with call bell/phone within reach;with bed alarm set Nurse Communication: Mobility status;Other (comment) (fall) PT Visit Diagnosis: Unsteadiness on feet (R26.81);Other abnormalities of gait and mobility (R26.89);History of falling (Z91.81);Muscle weakness (generalized) (M62.81);Difficulty in walking, not elsewhere classified (R26.2);Pain Pain - Right/Left: Right     Time: 1130-1200 PT Time Calculation (min) (ACUTE ONLY): 30 min  Charges:    $Therapeutic Activity: 23-37 mins PT General Charges $$ ACUTE PT VISIT: 1 Visit                     John [name], PT, DPT Acute Rehabilitation Services Office [phone]    John [name] 03/16/2024, 1:06 PM

## 2024-03-16 NOTE — TOC Transition Note (Signed)
 Transition of Care [facility]) - Discharge Note   Patient Details  Name: John [name] MRN: [id number] Date of Birth: 21-Jun-1961  [facility]) CM/SW Contact:  Arnie Clingenpeel A [place], LCSW Phone Number: 03/16/2024, 11:19 AM   Clinical Narrative:     Patient will DC to: [name]  Anticipated DC date 03/16/24  Family notified: [name]  Transport by: [facility]      Per MD patient ready for DC to [name]. RN, patient, patient's family, and facility notified of DC. Discharge Summary and FL2 sent to facility. RN to call report prior to discharge [phone]). DC packet on chart. Ambulance transport requested for patient.     CSW will sign off for now as social work intervention is no longer needed. Please consult us  again if new needs arise.    Final next level of care: Skilled Nursing Facility Barriers to Discharge: Barriers Resolved   Patient Goals and CMS Choice   CMS Medicare.gov Compare Post Acute Care list provided to:: Patient Choice offered to / list presented to : Patient      Discharge Placement              Patient chooses bed at: [name] Patient to be transferred to facility by: [facility] Name of family member notified: [name] Patient and family notified of of transfer: 03/16/24  Discharge Plan and Services Additional resources added to the After Visit Summary for                                       Social Drivers of Health (SDOH) Interventions SDOH Screenings   Food Insecurity: No Food Insecurity (03/10/2024)  Housing: Low Risk  (03/10/2024)  Transportation Needs: No Transportation Needs (03/10/2024)  Utilities: Not At Risk (03/10/2024)  Depression (PHQ2-9): Low Risk  (12/10/2023)  Tobacco Use: Medium Risk (03/10/2024)     Readmission Risk Interventions     No data to display

## 2024-03-16 NOTE — Progress Notes (Signed)
 PROGRESS NOTE   John [name]  FMW:[id number]    DOB: Sep 30, 1960    DOA: 03/10/2024  PCP: [name], MD   I have briefly reviewed patients previous medical records in [facility]   Brief Hospital Course:  63 year old male, lives alone, has a cane and walker but only uses the cane, mostly sedentary, medical history significant for Parkinson's disease, HTN, presented to the ED due to recurrent falls including falling face forward the last time and family noted patient to be confused.  He also reported getting shortness of breath with exertion for last few weeks.  In ED, tachycardic, hypotensive which improved after IV fluid bolus, CT C/A/P with contrast showed bilateral PE with right heart strain, right maxillary sinus and inferior orbital fractures, rib fractures and S3 fracture.  Admitted for acute pulmonary embolism, gait ataxia with frequent falls and multiple fractures as noted above, acute kidney injury and hypokalemia.  Medically stable for DC to SNF since 6/28, patient opted for [facility].  Insurance authorization initiated 6/30.  Delays noted.   Assessment & Plan:   Acute pulmonary embolism without cor pulmonale CT C/A/P with contrast 6/25: Bilateral lobar and segmental pulmonary artery emboli with right heart straining. Hemodynamically stable Lower extremity venous Dopplers and DVT ruled out. 2D echo: LVEF 60-65%, grade 1 diastolic dysfunction, RV size and function normal. Long discussion with patient that it can be dangerous if he is on anticoagulation and continues to have frequent falls which could lead to life-threatening bleeding complications.  He verbalized understanding.   Transitioned from IV heparin  to Eliquis 6/27.  Continue.  Stable. This appears to be an unprovoked PE.  Could consider outpatient hematology consultation for further evaluation of hypercoagulable state.  Duration of anticoagulation at least 3 months and then can reassess regarding further  need for continued AC.  Confounding factor will be his fall risk for continued anticoagulation.  Frequent falls with multiple injuries as noted below (right maxillary fracture, right inferior and medial orbital wall fracture, right 5-6 rib fracture, S3 fracture): CT maxillofacial 6/25: Acute mildly displaced fracture of the right maxillary sinus.  Acute nondisplaced fractures of the inferior rim and medial wall of the right orbit. CT head and C-spine: No acute abnormality CT C/A/P with contrast: Nondisplaced fracture through posterior elements of S3.  Fracture of anterior right 5th and 6th ribs, likely subacute or old. Concerned that this may be related to his Parkinson's disease with gait instability Orthostatic vitals negative. 6/26: Discussed with Dr. Elspeth Coddington, ENT who reviewed patient's imaging studies and indicated that the above facial fractures can be managed nonoperatively, patient was advised that he should not blow his nose and outpatient follow-up in the office with him in a week's time. PT has seen and recommend SNF.  TOC on board.  Awaiting SNF insurance authorization.  Had a long discussion with patient's sister via phone on 6/27.  She stated that patient has been having unsteady gait for more than a year, progressively worsening with frequent falls.  She does not think that he is able to care for himself.  We agreed that after short-term rehab at [facility], depending on how he does, he will either need home 24/7 assistance and supervision which if not available then may need LTC.  Acute kidney injury Suspected due to dehydration Creatinine on 12/10/2023 was 1.09.  Presented with creatinine of 1.99. Resolved with IV fluids.  Dehydration with hyponatremia Resolved with IV fluids.  Encourage oral intake.  Hypokalemia Replaced.  Hypomagnesemia Replaced.  Acute metabolic encephalopathy May be due to AKI, dehydration with hyponatremia Transient and resolved by the time he  presented to ED. Delirium precautions.  Parkinson's disease Follows with Dr. Skeet, [facility] neurology. Continue levodopa   Anemia Relatively stable.  Follow CBC periodically as outpatient.  GERD PPI  Body mass index is 33.97 kg/m.   DVT prophylaxis:   Close anticoagulation   Code Status: Full Code:  Family Communication: None at bedside Disposition:  Medically stable for DC to SNF pending insurance authorization.   Consultants:     Procedures:     Subjective:  Denies complaints.  Tired of being in bed.  Wants to discharge preferably to home.  Advised him that would not be a safe option currently, awaiting SNF and he is agreeable.  Appears to be impulsive and attempts to get out of bed by himself, advised strongly against it that he should not get up without nursing assistance.  Updated nursing.  Objective:   Vitals:   03/15/24 1725 03/15/24 2012 03/16/24 0454 03/16/24 0749  BP: 123/78 115/77 120/89 114/72  Pulse: 91 86 98 81  Resp: 18 18 18 18   Temp: 98.2 F (36.8 C) 98.4 F (36.9 C) 98.6 F (37 C) 98.3 F (36.8 C)  TempSrc:    Oral  SpO2: 98% 96% 97% 97%  Weight:      Height:        General exam: Middle-age male, moderately built and obese sitting up comfortably at edge of bed. Respiratory system: Clear to auscultation.  No increased work of breathing. Cardiovascular system: S1 & S2 heard, RRR. No JVD, murmurs, rubs, gallops or clicks. No pedal edema.  Off of telemetry now. Gastrointestinal system: Abdomen is nondistended, soft and nontender. No organomegaly or masses felt. Normal bowel sounds heard. Central nervous system: Alert and oriented. No focal neurological deficits. Extremities: Symmetric 5 x 5 power.  Have not noticed any tremors since this hospital admission. Skin: No rashes, lesions or ulcers Psychiatry: Judgement and insight appear impaired. Mood & affect appropriate.  HEENT: Right periorbital and upper maxillary bruising/ecchymosis are steadily  decreasing.  Also has some old ecchymosis on the anterior right side of the neck.  Mild right eye temporal subconjunctival hemorrhage- decreasing. PERTLA    Data Reviewed:   I have personally reviewed following labs and imaging studies   CBC: Recent Labs  Lab 03/11/24 0457 03/12/24 0452 03/13/24 0434  WBC 6.8 5.1 5.9  HGB 10.2* 9.7* 9.5*  HCT 31.3* 30.0* 29.5*  MCV 84.8 84.5 85.5  PLT 217 199 226    Basic Metabolic Panel: Recent Labs  Lab 03/10/24 1330 03/11/24 0457 03/11/24 1054 03/12/24 0452 03/13/24 0434  NA 134* 134*  --  135 136  K 2.8* 2.8*  --  3.1* 3.6  CL 97* 104  --  103 106  CO2 19* 18*  --  20* 21*  GLUCOSE 115* 97  --  90 79  BUN 26* 22  --  18 16  CREATININE 1.99* 1.55*  --  1.17 0.97  CALCIUM 9.6 8.6*  --  8.6* 8.8*  MG  --   --  1.6* 2.0 1.7    Liver Function Tests: Recent Labs  Lab 03/10/24 1330 03/11/24 0457 03/12/24 0452  AST 25 29 22   ALT <5 <5 <5  ALKPHOS 129* 82 75  BILITOT 1.3* 1.3* 0.8  PROT 7.3 6.1* 5.8*  ALBUMIN 4.4 3.4* 3.0*    CBG: Recent Labs  Lab 03/10/24 1331  GLUCAP  119*    Microbiology Studies:  No results found for this or any previous visit (from the past 240 hours).  Radiology Studies:  No results found.   Scheduled Meds:    apixaban  10 mg Oral BID   Followed by   NOREEN ON 03/19/2024] apixaban  5 mg Oral BID   carbidopa -levodopa   1 tablet Oral 6 X Daily   pantoprazole   40 mg Oral Daily    Continuous Infusions:       LOS: 6 days     Trenda Mar, MD,  FACP, [facility], [facility], [facility]   [facility] Ad[facility] [place]      To contact the attending provider between 7A-7P or the covering provider during after hours 7P-7A, please log into the web site [email or web address] and access using universal [place] password for that web site. If you do not have the password, please call the hospital operator.  03/16/2024, 8:33 AM

## 2024-03-16 NOTE — ED Notes (Signed)
 NT called CCMD to monitor Pt.

## 2024-03-16 NOTE — Plan of Care (Addendum)
 Briefly, discussed [name]'s case with Dr. Ula with the ED team over phone. Patient was discharged to SNF and brought back in an hour later. He was noted to have seizure like activity and another resident helped him down to the floor. He was recently diagnosed with PE and was on eliquis. Initial GCS of 8, improved and now 14. He is following commands in all extremities, CT Head negative for acute abnormalities.  From a neuro standpoint, low suspicion for persistent seizures given clinical improvement, specially with being able to follow commands in all extremities. Presenting episode could have been seizure vs convulsive syncope. Parkinson's disease patients are at risk of orthostasis, PE would also increase risk of orthostasis.  Recommend getting a routine EEG. AEDs are no warranted at this time. Low suspicion for persistent seizures given improvement and so dont see a utility in LTM EEG. If the rEEG is negative, patient can just follow up with outpatient neurologist Dr. Juliene Dunnings who he is already established with. Continue Sinemet. Recent Vit B12 levels were low normal. Will recommend starting cyanocobalamin and multiviamin PO.  John [name] [facility]

## 2024-03-16 NOTE — ED Triage Notes (Signed)
 Pt bib ems for seizure by nursing home residents. Residents were able to lower to ground, unsure if hit head. On eliquis. Rpupil<L pupil. GCS 8. Capnography 10, RR 50. Garbled speech.  160 systolic HR 150 CBG 114  GCS 10 on arrival

## 2024-03-16 NOTE — ED Provider Notes (Signed)
 I have reviewed the patient's chart after it is been merged.  It does appear that he did have large pulmonary emboli.  I am concerned that he has worsening PE which caused his episode tonight.  CT angiogram is ordered.  I did call over to have this done prior to any labs.  I have reviewed his labs recently and his creatinine was within normal limits so I think that is okay to do this before his labs result.   Ula Prentice SAUNDERS, MD 03/16/24 [phone]

## 2024-03-16 NOTE — ED Notes (Signed)
 C- collar removed per verbal from Dr. Ula.

## 2024-03-16 NOTE — ED Provider Notes (Signed)
 [place] EMERGENCY DEPARTMENT AT [facility] Provider Note   CSN: [id number] Arrival date & time: 03/16/24  1752     Patient presents with: Seizures and Altered Mental Status   John [name] is a 63 y.o. male.   63 year old male with past medical history of Parkinson's disease and frequent falls he was just discharged today to a nursing facility for frequent falls after he sustained a right sided facial fracture and multiple rib fractures presenting to the emergency department today with altered mental status.  The patient apparently had a witnessed seizure and was helped to the ground by the residents at the skilled nursing facility.  He had only been there a short time.  He was initially tachypneic with a GCS of 8.  Was made a level 1 trauma at that time.  He was sent to the emergency department at that time for further evaluation.  The patient's mental status has improved and route here.   Seizures Altered Mental Status Associated symptoms: seizures        Prior to Admission medications   Not on File    Allergies: Patient has no allergy information on record.    Review of Systems  Reason unable to perform ROS: Confusion.  Neurological:  Positive for seizures.  All other systems reviewed and are negative.   Updated Vital Signs BP (!) 149/83   Pulse (!) 132   Temp 98.3 F (36.8 C)   Resp 15   Ht 6' 3 (1.905 m)   Wt 74.8 kg   SpO2 100%   BMI 20.62 kg/m   Physical Exam Vitals and nursing note reviewed.   Gen: Sonorous but will arouse to verbal stimuli Eyes: PERRL, EOMI HEENT: no oropharyngeal swelling Neck: trachea midline, c-collar in place, no C-spine tenderness or step-offs noted Resp: clear to auscultation bilaterally Card: RRR, no murmurs, rubs, or gallops Abd: nontender, nondistended Extremities: no calf tenderness, no edema Neuro: GCS 14 for confusion, will move all extemities on command Vascular: 2+ radial pulses bilaterally, 2+ DP  pulses bilaterally Skin: Bruising noted over right maxillofacial region as well as bruising noted over the thoracic region that appear old   (all labs ordered are listed, but only abnormal results are displayed) Labs Reviewed  PROTIME-INR - Abnormal; Notable for the following components:      Result Value   Prothrombin Time 19.1 (*)    INR 1.5 (*)    All other components within normal limits  LACTIC ACID, PLASMA - Abnormal; Notable for the following components:   Lactic Acid, Venous 4.7 (*)    All other components within normal limits  CBC - Abnormal; Notable for the following components:   WBC 11.6 (*)    RBC 3.30 (*)    Hemoglobin 8.9 (*)    HCT 28.2 (*)    All other components within normal limits  COMPREHENSIVE METABOLIC PANEL WITH GFR - Abnormal; Notable for the following components:   Sodium 134 (*)    CO2 16 (*)    Glucose, Bld 100 (*)    Creatinine, Ser 1.48 (*)    Calcium 8.5 (*)    Total Protein 5.6 (*)    Albumin 3.0 (*)    GFR, Estimated 53 (*)    All other components within normal limits  I-STAT CHEM 8, ED - Abnormal; Notable for the following components:   Creatinine, Ser 1.30 (*)    Glucose, Bld 100 (*)    TCO2 15 (*)  Hemoglobin 8.8 (*)    HCT 26.0 (*)    All other components within normal limits  I-STAT CG4 LACTIC ACID, ED - Abnormal; Notable for the following components:   Lactic Acid, Venous 5.0 (*)    All other components within normal limits  BRAIN NATRIURETIC PEPTIDE  ETHANOL  URINALYSIS, ROUTINE W REFLEX MICROSCOPIC  LACTIC ACID, PLASMA  SAMPLE TO BLOOD BANK  TROPONIN I (HIGH SENSITIVITY)  TROPONIN I (HIGH SENSITIVITY)    EKG: EKG Interpretation Date/Time:  Tuesday March 16 2024 18:49:31 EDT Ventricular Rate:  129 PR Interval:  171 QRS Duration:  71 QT Interval:  328 QTC Calculation: 481 R Axis:   44  Text Interpretation: Sinus tachycardia Low voltage, precordial leads Probable anteroseptal infarct, old Borderline ST depression,  anterolateral leads Confirmed by Ula Barter [phone]) on 03/16/2024 6:52:22 PM  Radiology: CT Angio Chest PE W and/or Wo Contrast Result Date: 03/16/2024 CLINICAL DATA:  Pulmonary embolism (PE) suspected, high prob EXAM: CT ANGIOGRAPHY CHEST WITH CONTRAST TECHNIQUE: Multidetector CT imaging of the chest was performed using the standard protocol during bolus administration of intravenous contrast. Multiplanar CT image reconstructions and MIPs were obtained to evaluate the vascular anatomy. RADIATION DOSE REDUCTION: This exam was performed according to the departmental dose-optimization program which includes automated exposure control, adjustment of the mA and/or kV according to patient size and/or use of iterative reconstruction technique. CONTRAST:  55mL OMNIPAQUE IOHEXOL 350 MG/ML SOLN COMPARISON:  Radiograph earlier today.  Chest CT 03/10/2024 FINDINGS: Cardiovascular: Known bilateral pulmonary emboli, greatest in the lower lobes. Stable or minimally improved clot burden over the last 6 days. No new or progressive pulmonary embolus. Unchanged RV to LV ratio of 1. Normal caliber thoracic aorta without acute aortic findings. Left vertebral artery arises directly from the thoracic aorta, variant anatomy. Unchanged pericardial effusion. Mediastinum/Nodes: No mediastinal or hilar adenopathy. Small hiatal hernia. No esophageal wall thickening. Lungs/Pleura: No pulmonary infarct. No focal airspace disease. Lung volumes are low. No pleural fluid. No pneumothorax. Upper Abdomen: Stable subcapsular hypodensity in the right lobe of the liver, previously characterized as cyst. No acute upper abdominal findings Musculoskeletal: There are no acute or suspicious osseous abnormalities. Thoracic spondylosis. Remote left rib fractures. Review of the MIP images confirms the above findings. IMPRESSION: 1. Known bilateral pulmonary emboli, greatest in the lower lobes. Stable or minimally improved clot burden over the last 6 days. No  new or progressive pulmonary embolus. 2. Unchanged RV to LV ratio of 1. 3. Unchanged pericardial effusion. Electronically Signed   By: Andrea Gasman M.D.   On: 03/16/2024 20:15   DG Chest Port 1 View Result Date: 03/16/2024 CLINICAL DATA:  Status post seizure. EXAM: PORTABLE CHEST 1 VIEW COMPARISON:  October 02, 2021 FINDINGS: The heart size and mediastinal contours are within normal limits. Low lung volumes are noted. Both lungs are clear. Multilevel degenerative changes are seen throughout the thoracic spine. IMPRESSION: No active disease. Electronically Signed   By: Suzen Dials M.D.   On: 03/16/2024 19:13   DG Pelvis Portable Result Date: 03/16/2024 CLINICAL DATA:  Status post seizure. EXAM: PORTABLE PELVIS 1-2 VIEWS COMPARISON:  None Available. FINDINGS: There is no evidence of pelvic fracture or diastasis. No pelvic bone lesions are seen. Degenerative changes are seen within visualized portion of the lower lumbar spine. IMPRESSION: Negative. Electronically Signed   By: Suzen Dials M.D.   On: 03/16/2024 19:12   CT Head Wo Contrast Result Date: 03/16/2024 CLINICAL DATA:  Head trauma, neck trauma.  Level  1 trauma, seizures. EXAM: CT HEAD WITHOUT CONTRAST CT CERVICAL SPINE WITHOUT CONTRAST TECHNIQUE: Multidetector CT imaging of the head and cervical spine was performed following the standard protocol without intravenous contrast. Multiplanar CT image reconstructions of the cervical spine were also generated. RADIATION DOSE REDUCTION: This exam was performed according to the departmental dose-optimization program which includes automated exposure control, adjustment of the mA and/or kV according to patient size and/or use of iterative reconstruction technique. COMPARISON:  None Available. FINDINGS: CT HEAD FINDINGS Brain: No acute intracranial hemorrhage. No CT evidence of acute infarct. Nonspecific hypoattenuation in the periventricular and subcortical white matter favored to reflect chronic  microvascular ischemic changes. Mild parenchymal volume loss. There is a small arachnoid cyst in the left middle cranial fossa. No edema, mass effect, or midline shift. The basilar cisterns are patent. Ventricles: The ventricles are normal. Vascular: No hyperdense vessel or unexpected calcification. Skull: No acute or aggressive finding. Orbits: Orbits are symmetric. Sinuses: Mucosal thickening in the right maxillary sinus with hyperattenuating material which may reflect inspissated secretions versus fungal colonization. Mild mucosal thickening in the ethmoid sinuses. Other: Mastoid air cells are clear. There is a 1.9 x 0.9 cm hematoma in the right facial soft tissues overlying the zygomatic arch. Partially visualized nasal airway on the right. CT CERVICAL SPINE FINDINGS Alignment: Alignment is maintained. No listhesis. No facet subluxation or dislocation. Skull base and vertebrae: No acute fracture. No primary bone lesion or focal pathologic process. Prominent degenerative endplate osteophytes at multiple levels particularly at C3-4 and C4-5. Soft tissues and spinal canal: No prevertebral fluid or swelling. No visible canal hematoma. Disc levels: Intervertebral disc space narrowing at multiple levels. 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 Facet arthrosis and uncovertebral hypertrophy at multiple levels. Severe foraminal narrowing at C3-4. Upper chest: Negative. Other: None. IMPRESSION: No CT evidence of acute intracranial abnormality. No acute fracture or traumatic malalignment of the cervical spine. Hematoma in the right facial subcutaneous tissues overlying the zygomatic arch. Degenerative changes as above. Additional chronic changes as above. These results were called by telephone at the time of interpretation on 03/16/2024 at 6:39 pm to provider Dr. Lyndel, who verbally acknowledged these results.  Electronically Signed   By: Donnice Mania M.D.   On: 03/16/2024 18:39   CT Cervical Spine Wo Contrast Result Date: 03/16/2024 CLINICAL DATA:  Head trauma, neck trauma.  Level 1 trauma, seizures. EXAM: CT HEAD WITHOUT CONTRAST CT CERVICAL SPINE WITHOUT CONTRAST TECHNIQUE: Multidetector CT imaging of the head and cervical spine was performed following the standard protocol without intravenous contrast. Multiplanar CT image reconstructions of the cervical spine were also generated. RADIATION DOSE REDUCTION: This exam was performed according to the departmental dose-optimization program which includes automated exposure control, adjustment of the mA and/or kV according to patient size and/or use of iterative reconstruction technique. COMPARISON:  None Available. FINDINGS: CT HEAD FINDINGS Brain: No acute intracranial hemorrhage. No CT evidence of acute infarct. Nonspecific hypoattenuation in the periventricular and subcortical white matter favored to reflect chronic microvascular ischemic changes. Mild parenchymal volume loss. There is a small arachnoid cyst in the left middle cranial fossa. No edema, mass effect, or midline shift. The basilar cisterns are patent. Ventricles: The ventricles are normal. Vascular: No hyperdense vessel or unexpected calcification. Skull: No acute or aggressive finding. Orbits: Orbits are symmetric. Sinuses: Mucosal thickening in the right maxillary sinus with hyperattenuating material which may reflect inspissated secretions versus fungal  colonization. Mild mucosal thickening in the ethmoid sinuses. Other: Mastoid air cells are clear. There is a 1.9 x 0.9 cm hematoma in the right facial soft tissues overlying the zygomatic arch. Partially visualized nasal airway on the right. CT CERVICAL SPINE FINDINGS Alignment: Alignment is maintained. No listhesis. No facet subluxation or dislocation. Skull base and vertebrae: No acute fracture. No primary bone lesion or focal pathologic process.  Prominent degenerative endplate osteophytes at multiple levels particularly at C3-4 and C4-5. Soft tissues and spinal canal: No prevertebral fluid or swelling. No visible canal hematoma. Disc levels: Intervertebral disc space narrowing at multiple levels. 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 Facet arthrosis and uncovertebral hypertrophy at multiple levels. Severe foraminal narrowing at C3-4. Upper chest: Negative. Other: None. IMPRESSION: No CT evidence of acute intracranial abnormality. No acute fracture or traumatic malalignment of the cervical spine. Hematoma in the right facial subcutaneous tissues overlying the zygomatic arch. Degenerative changes as above. Additional chronic changes as above. These results were called by telephone at the time of interpretation on 03/16/2024 at 6:39 pm to provider Dr. Lyndel, who verbally acknowledged these results. Electronically Signed   By: Donnice Mania M.D.   On: 03/16/2024 18:39     Procedures   Medications Ordered in the ED  multivitamin with minerals tablet 1 tablet (has no administration in time range)  cyanocobalamin (VITAMIN B12) tablet 1,000 mcg (has no administration in time range)  lactated ringers bolus 1,000 mL (0 mLs Intravenous Stopped 03/16/24 1932)  iohexol (OMNIPAQUE) 350 MG/ML injection 55 mL (55 mLs Intravenous Contrast Given 03/16/24 1954)                                    Medical Decision Making Male in his 49s with past medical history of Parkinson's disease presenting to the emergency department today as a level 1 trauma after a fall on blood thinners at his skilled nursing facility.  Will further evaluate patient here with basic labs including a lactic acid.  Patient's EF is 60 to 65%.  Will give him IV fluids for the tachycardia obtain EKG to evaluate for sinus tachycardia versus arrhythmia.  Will obtain a CT scan of his head  and cervical spine in addition to a chest x-ray and pelvis x-ray for further evaluation for acute traumatic injuries.  This seems to be more consistent with a fall after a seizure.  He does not have a history of seizures.  Once he is cleared from a trauma perspective we will discuss his case with neurology.  The patient's initial trauma workup was unremarkable.  His mental status did continue to improve but he remained tachycardic.  I did call and discussed this with Dr. Vanessa who recommends routine EEG.  The patient's protestant tachycardia and lactic acidosis a call is placed to the hospitalist service.  He will be admitted for further evaluation.  He is afebrile here and CT scans did not show any obvious infectious sources.  Patient's urinalysis is pending at the time of admission.  CRITICAL CARE Performed by: Prentice JONELLE Medicus   Total critical care time: 40 minutes  Critical care time was exclusive of separately billable procedures and treating other patients.  Critical care was necessary to treat or prevent imminent or life-threatening deterioration.  Critical care was time spent personally by me on the following activities:  development of treatment plan with patient and/or surrogate as well as nursing, discussions with consultants, evaluation of patient's response to treatment, examination of patient, obtaining history from patient or surrogate, ordering and performing treatments and interventions, ordering and review of laboratory studies, ordering and review of radiographic studies, pulse oximetry and re-evaluation of patient's condition.   Amount and/or Complexity of Data Reviewed Labs: ordered. Radiology: ordered.  Risk Prescription drug management. Decision regarding hospitalization.        Final diagnoses:  Lactic acidosis  Observed seizure-like activity (HCC)  Sinus tachycardia    ED Discharge Orders     None          Ula Prentice SAUNDERS, MD 03/16/24 2153

## 2024-03-16 NOTE — ED Notes (Signed)
 Pt assisted with urinal use. Unable to void.

## 2024-03-16 NOTE — ED Notes (Signed)
 Please call patient sister karen with an update contact info in patient chart

## 2024-03-16 NOTE — ED Notes (Signed)
 Pt unable to void. In and out cath performed. 900 ML  urine output.

## 2024-03-16 NOTE — ED Notes (Signed)
 Labs recollected and sent confirming that this is correct medical record number.

## 2024-03-16 NOTE — H&P (Signed)
 History and Physical    John [name] FMW:[id number] DOB: [birth year] DOA: 03/16/2024  PCP: [name], MD   Patient coming from: SNF   Chief Complaint: Seizure, fall   HPI: John [name] is a 63 y.o. male with medical history significant for Parkinson disease, frequent falls with facial fractures, anemia, and bilateral pulmonary embolism who presents after an apparent seizure with fall.  Patient was admitted to the hospital on 03/10/2024 with acute bilateral pulmonary emboli that appeared to be unprovoked, had echocardiogram with normal RV size and function, AKI and hyponatremia which resolved with IV fluid hydration, acute encephalopathy that resolved, and he was discharged to an SNF on Eliquis today.  Shortly after arriving at the SNF, bystanders saw the patient having what appeared to be a seizure, helped lower him to the ground, and called EMS.  Patient denies any new pain or injury and does not believe that he actually lost consciousness as he remembers hearing someone calling for nurse when he was falling.  ED Course: Upon arrival to the ED, patient is found to be afebrile and saturating well on room air with elevated heart rate and stable blood pressure.  Labs are most notable for creatinine 1.48, WBC 11,600, hemoglobin 8.9, lactic acid 4.7, and troponin normal x 2.  There are no acute findings on head CT or cervical spine CT.  Plain films of the pelvis are negative for acute fracture.  CTA chest reveals no change or minimal improvement in clot burden and no change in RV to LV ratio or small pericardial effusion.  Patient was evaluated by trauma surgery in the ED, ED physician discussed case with neurology, and the patient was given B12, multivitamin, and a liter of LR.  Review of Systems:  All other systems reviewed and apart from HPI, are negative.  Past Medical History:  Diagnosis Date   Bilateral pulmonary embolism (HCC) 03/16/2024   Parkinson disease (HCC) 03/16/2024     History reviewed. No pertinent surgical history.  Social History:   reports that he quit smoking about 25 years ago. His smoking use included cigars. He has never used smokeless tobacco. He reports current alcohol use. No history on file for drug use.  Not on File  Family History  Problem Relation Age of Onset   Cancer Father      Prior to Admission medications   Medication Sig Start Date End Date Taking? Authorizing Provider  Apixaban (ELIQUIS DVT/PE STARTER PACK PO) Take 10 mg by mouth in the morning and at bedtime. 03/16/24 03/18/24 Yes [provider]  apixaban (ELIQUIS) 5 MG TABS tablet Take 5 mg by mouth 2 (two) times daily. 03/19/24  Yes [provider]  carbidopa-levodopa (SINEMET CR) 50-200 MG tablet Take by mouth. 02/18/24  Yes [provider]  meloxicam (MOBIC) 7.5 MG tablet Take 7.5 mg by mouth daily. 02/28/24  Yes [provider]  methocarbamol (ROBAXIN) 500 MG tablet Take 500 mg by mouth 2 (two) times daily. 12/30/23  Yes [provider]  omeprazole (PRILOSEC) 20 MG capsule Take 20 mg by mouth daily. 12/10/23  Yes [provider]  tamsulosin (FLOMAX) 0.4 MG CAPS capsule Take 0.4 mg by mouth daily. 02/10/24  Yes [provider]  tiZANidine (ZANAFLEX) 2 MG tablet Take 2 mg by mouth at bedtime. 02/28/24  Yes [provider]    Physical Exam: Vitals:   03/16/24 2155 03/16/24 2155 03/16/24 2221 03/16/24 2315  BP:   (!) 142/90   Pulse: ROLLEN)  121  (!) 116 (!) 109  Resp:  18  (!) 21  Temp:      SpO2: 99%  99% 100%  Weight:      Height:        Constitutional: NAD, no pallor or diaphoresis   Eyes: PERTLA, lids and conjunctivae normal ENMT: Mucous membranes are moist. Posterior pharynx clear of any exudate or lesions.   Neck: supple, no masses  Respiratory: no wheezing, no crackles. No accessory muscle use.  Cardiovascular: S1 & S2 heard, regular rate and rhythm. No JVD.  Abdomen: No tenderness, soft. Bowel  sounds active.  Musculoskeletal: no clubbing / cyanosis. No joint deformity upper and lower extremities.   Skin: no significant rashes, lesions, ulcers. Warm, dry, well-perfused. Neurologic: CN 2-12 grossly intact. Resting tremor. Alert and oriented.  Psychiatric: Pleasant. Cooperative.    Labs and Imaging on Admission: I have personally reviewed following labs and imaging studies  CBC: Recent Labs  Lab 03/16/24 1930 03/16/24 1939  WBC 11.6*  --   HGB 8.9* 8.8*  HCT 28.2* 26.0*  MCV 85.5  --   PLT 271  --    Basic Metabolic Panel: Recent Labs  Lab 03/16/24 1930 03/16/24 1939  NA 134* 136  K 3.8 3.5  CL 103 106  CO2 16*  --   GLUCOSE 100* 100*  BUN 14 14  CREATININE 1.48* 1.30*  CALCIUM 8.5*  --    GFR: Estimated Creatinine Clearance: 61.5 mL/min (A) (by C-G formula based on SCr of 1.3 mg/dL (H)). Liver Function Tests: Recent Labs  Lab 03/16/24 1930  AST 20  ALT 9  ALKPHOS 82  BILITOT 1.1  PROT 5.6*  ALBUMIN 3.0*   No results for input(s): LIPASE, AMYLASE in the last 168 hours. No results for input(s): AMMONIA in the last 168 hours. Coagulation Profile: Recent Labs  Lab 03/16/24 1930  INR 1.5*   Cardiac Enzymes: No results for input(s): CKTOTAL, CKMB, CKMBINDEX, TROPONINI in the last 168 hours. BNP (last 3 results) No results for input(s): PROBNP in the last 8760 hours. HbA1C: No results for input(s): HGBA1C in the last 72 hours. CBG: No results for input(s): GLUCAP in the last 168 hours. Lipid Profile: No results for input(s): CHOL, HDL, LDLCALC, TRIG, CHOLHDL, LDLDIRECT in the last 72 hours. Thyroid Function Tests: No results for input(s): TSH, T4TOTAL, FREET4, T3FREE, THYROIDAB in the last 72 hours. Anemia Panel: No results for input(s): VITAMINB12, FOLATE, FERRITIN, TIBC, IRON, RETICCTPCT in the last 72 hours. Urine analysis:    Component Value Date/Time   COLORURINE YELLOW 03/16/2024 2140    APPEARANCEUR CLEAR 03/16/2024 2140   LABSPEC 1.018 03/16/2024 2140   PHURINE 6.0 03/16/2024 2140   GLUCOSEU NEGATIVE 03/16/2024 2140   HGBUR MODERATE (A) 03/16/2024 2140   BILIRUBINUR NEGATIVE 03/16/2024 2140   KETONESUR 5 (A) 03/16/2024 2140   PROTEINUR NEGATIVE 03/16/2024 2140   NITRITE NEGATIVE 03/16/2024 2140   LEUKOCYTESUR NEGATIVE 03/16/2024 2140   Sepsis Labs: @LABRCNTIP (procalcitonin:4,lacticidven:4) )No results found for this or any previous visit (from the past 240 hours).   Radiological Exams on Admission: CT Angio Chest PE W and/or Wo Contrast Result Date: 03/16/2024 CLINICAL DATA:  Pulmonary embolism (PE) suspected, high prob EXAM: CT ANGIOGRAPHY CHEST WITH CONTRAST TECHNIQUE: Multidetector CT imaging of the chest was performed using the standard protocol during bolus administration of intravenous contrast. Multiplanar CT image reconstructions and MIPs were obtained to evaluate the vascular anatomy. RADIATION DOSE REDUCTION: This exam was performed according to the departmental dose-optimization  program which includes automated exposure control, adjustment of the mA and/or kV according to patient size and/or use of iterative reconstruction technique. CONTRAST:  55mL OMNIPAQUE IOHEXOL 350 MG/ML SOLN COMPARISON:  Radiograph earlier today.  Chest CT 03/10/2024 FINDINGS: Cardiovascular: Known bilateral pulmonary emboli, greatest in the lower lobes. Stable or minimally improved clot burden over the last 6 days. No new or progressive pulmonary embolus. Unchanged RV to LV ratio of 1. Normal caliber thoracic aorta without acute aortic findings. Left vertebral artery arises directly from the thoracic aorta, variant anatomy. Unchanged pericardial effusion. Mediastinum/Nodes: No mediastinal or hilar adenopathy. Small hiatal hernia. No esophageal wall thickening. Lungs/Pleura: No pulmonary infarct. No focal airspace disease. Lung volumes are low. No pleural fluid. No pneumothorax. Upper Abdomen:  Stable subcapsular hypodensity in the right lobe of the liver, previously characterized as cyst. No acute upper abdominal findings Musculoskeletal: There are no acute or suspicious osseous abnormalities. Thoracic spondylosis. Remote left rib fractures. Review of the MIP images confirms the above findings. IMPRESSION: 1. Known bilateral pulmonary emboli, greatest in the lower lobes. Stable or minimally improved clot burden over the last 6 days. No new or progressive pulmonary embolus. 2. Unchanged RV to LV ratio of 1. 3. Unchanged pericardial effusion. Electronically Signed   By: Andrea Gasman M.D.   On: 03/16/2024 20:15   DG Chest Port 1 View Result Date: 03/16/2024 CLINICAL DATA:  Status post seizure. EXAM: PORTABLE CHEST 1 VIEW COMPARISON:  October 02, 2021 FINDINGS: The heart size and mediastinal contours are within normal limits. Low lung volumes are noted. Both lungs are clear. Multilevel degenerative changes are seen throughout the thoracic spine. IMPRESSION: No active disease. Electronically Signed   By: Suzen Dials M.D.   On: 03/16/2024 19:13   DG Pelvis Portable Result Date: 03/16/2024 CLINICAL DATA:  Status post seizure. EXAM: PORTABLE PELVIS 1-2 VIEWS COMPARISON:  None Available. FINDINGS: There is no evidence of pelvic fracture or diastasis. No pelvic bone lesions are seen. Degenerative changes are seen within visualized portion of the lower lumbar spine. IMPRESSION: Negative. Electronically Signed   By: Suzen Dials M.D.   On: 03/16/2024 19:12   CT Head Wo Contrast Result Date: 03/16/2024 CLINICAL DATA:  Head trauma, neck trauma.  Level 1 trauma, seizures. EXAM: CT HEAD WITHOUT CONTRAST CT CERVICAL SPINE WITHOUT CONTRAST TECHNIQUE: Multidetector CT imaging of the head and cervical spine was performed following the standard protocol without intravenous contrast. Multiplanar CT image reconstructions of the cervical spine were also generated. RADIATION DOSE REDUCTION: This exam was  performed according to the departmental dose-optimization program which includes automated exposure control, adjustment of the mA and/or kV according to patient size and/or use of iterative reconstruction technique. COMPARISON:  None Available. FINDINGS: CT HEAD FINDINGS Brain: No acute intracranial hemorrhage. No CT evidence of acute infarct. Nonspecific hypoattenuation in the periventricular and subcortical white matter favored to reflect chronic microvascular ischemic changes. Mild parenchymal volume loss. There is a small arachnoid cyst in the left middle cranial fossa. No edema, mass effect, or midline shift. The basilar cisterns are patent. Ventricles: The ventricles are normal. Vascular: No hyperdense vessel or unexpected calcification. Skull: No acute or aggressive finding. Orbits: Orbits are symmetric. Sinuses: Mucosal thickening in the right maxillary sinus with hyperattenuating material which may reflect inspissated secretions versus fungal colonization. Mild mucosal thickening in the ethmoid sinuses. Other: Mastoid air cells are clear. There is a 1.9 x 0.9 cm hematoma in the right facial soft tissues overlying the zygomatic arch. Partially visualized nasal  airway on the right. CT CERVICAL SPINE FINDINGS Alignment: Alignment is maintained. No listhesis. No facet subluxation or dislocation. Skull base and vertebrae: No acute fracture. No primary bone lesion or focal pathologic process. Prominent degenerative endplate osteophytes at multiple levels particularly at C3-4 and C4-5. Soft tissues and spinal canal: No prevertebral fluid or swelling. No visible canal hematoma. Disc levels: Intervertebral disc space narrowing at multiple levels. 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 Facet arthrosis and uncovertebral hypertrophy at multiple levels. Severe foraminal narrowing at C3-4. Upper chest: Negative. Other:  None. IMPRESSION: No CT evidence of acute intracranial abnormality. No acute fracture or traumatic malalignment of the cervical spine. Hematoma in the right facial subcutaneous tissues overlying the zygomatic arch. Degenerative changes as above. Additional chronic changes as above. These results were called by telephone at the time of interpretation on 03/16/2024 at 6:39 pm to provider Dr. Lyndel, who verbally acknowledged these results. Electronically Signed   By: Donnice Mania M.D.   On: 03/16/2024 18:39   CT Cervical Spine Wo Contrast Result Date: 03/16/2024 CLINICAL DATA:  Head trauma, neck trauma.  Level 1 trauma, seizures. EXAM: CT HEAD WITHOUT CONTRAST CT CERVICAL SPINE WITHOUT CONTRAST TECHNIQUE: Multidetector CT imaging of the head and cervical spine was performed following the standard protocol without intravenous contrast. Multiplanar CT image reconstructions of the cervical spine were also generated. RADIATION DOSE REDUCTION: This exam was performed according to the departmental dose-optimization program which includes automated exposure control, adjustment of the mA and/or kV according to patient size and/or use of iterative reconstruction technique. COMPARISON:  None Available. FINDINGS: CT HEAD FINDINGS Brain: No acute intracranial hemorrhage. No CT evidence of acute infarct. Nonspecific hypoattenuation in the periventricular and subcortical white matter favored to reflect chronic microvascular ischemic changes. Mild parenchymal volume loss. There is a small arachnoid cyst in the left middle cranial fossa. No edema, mass effect, or midline shift. The basilar cisterns are patent. Ventricles: The ventricles are normal. Vascular: No hyperdense vessel or unexpected calcification. Skull: No acute or aggressive finding. Orbits: Orbits are symmetric. Sinuses: Mucosal thickening in the right maxillary sinus with hyperattenuating material which may reflect inspissated secretions versus fungal  colonization. Mild mucosal thickening in the ethmoid sinuses. Other: Mastoid air cells are clear. There is a 1.9 x 0.9 cm hematoma in the right facial soft tissues overlying the zygomatic arch. Partially visualized nasal airway on the right. CT CERVICAL SPINE FINDINGS Alignment: Alignment is maintained. No listhesis. No facet subluxation or dislocation. Skull base and vertebrae: No acute fracture. No primary bone lesion or focal pathologic process. Prominent degenerative endplate osteophytes at multiple levels particularly at C3-4 and C4-5. Soft tissues and spinal canal: No prevertebral fluid or swelling. No visible canal hematoma. Disc levels: Intervertebral disc space narrowing at multiple levels. 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 Facet arthrosis and uncovertebral hypertrophy at multiple levels. Severe foraminal narrowing at C3-4. Upper chest: Negative. Other: None. IMPRESSION: No CT evidence of acute intracranial abnormality. No acute fracture or traumatic malalignment of the cervical spine. Hematoma in the right facial subcutaneous tissues overlying the zygomatic arch. Degenerative changes as above. Additional chronic changes as above. These results were called by telephone at the time of interpretation on 03/16/2024 at 6:39 pm to provider Dr. Lyndel, who verbally acknowledged these results. Electronically Signed   By: Donnice Mania HERO.D.  On: 03/16/2024 18:39    EKG: Independently reviewed. Sinus tachycardia, rate 129.   Assessment/Plan   1. Seizure vs convulsive syncope vs tremulousness with fall  - Patient had what appeared to bystanders to be a seizure, had GCS of 8 with EMS, initial lactate was 4.7 and quickly normalized, and his mental status has returned to baseline  - Patient does not believe he lost consciousness and wonders if bystanders mistook his Parkinson tremor for seizure  -  No acute findings noted on head CT in ED; no structural cardiac cause for syncope noted on TTE from a few days ago - Check routine EEG, use seizure precautions, continue cardiac monitoring    2. PE  - Continue Eliquis    3. AKI  - SCr is 1.48, up from 0.97 three days earlier  - Continue IVF hydration, repeat chem panel in am    4. Parkinson disease  - Continue Sinemet    5. Lactic acidosis  - Cleared quickly, no infectious s/s, increases suspicion for seizure    6. Anemia  - Appears stable    DVT prophylaxis: Eliquis  Code Status: Full  Level of Care: Level of care: Telemetry Medical Family Communication: None present  Disposition Plan:  Patient is from: SNF  Anticipated d/c is to: SNF  Anticipated d/c date is: 7/2 or 03/18/24  Patient currently: Pending cardiac monitoring, EEG, improved renal function  Consults called: None  Admission status: Observation     Evalene GORMAN Sprinkles, MD [facility]  03/16/2024, 11:24 PM

## 2024-03-16 NOTE — Progress Notes (Signed)
 Orthopedic Tech Progress Note Patient Details:  John [name] 09/16/1876 [id number]  Patient ID: John [name], male   DOB: 09/16/1876, 63 y.o.   MRN: [id number] Responded to level one trauma ortho techs currently not needed [name] 03/16/2024, 6:14 PM

## 2024-03-16 NOTE — ED Notes (Signed)
 Report given to Gerilyn Pilgrim, [place]

## 2024-03-16 NOTE — H&P (Signed)
   Admitting Physician: Deward PARAS Valene Villa  Service: Trauma Surgery  CC: Fall  Subjective   Mechanism of Injury: John [name] is an 63 y.o. male who presented as a level 1 trauma after a fall on eliquis.  He was discharged earlier today from the medical service.  He was at the nursing home for 1 hour.  Another resident whitnessed his fall and caught him so he didn't hit the ground hard.  EMS brought him back to the hospital.  PMHX Date Unknown Hypertension Date Unknown Parkinson's disease [facility]) Date Unknown Stroke [facility])  Recent frequent falls  Recent PE  PSHX None  PFHX       Mother   Father (Deceased) Cancer  Sister   Brother    History reviewed. No pertinent family history.  Social:  Smoking Status Former Types Cigars Smokeless Tobacco Status Never Comment Quit 200  Allergies: NKDA  Medications:   acetaminophen (TYLENOL) 500 MG tablet  apixaban (ELIQUIS) 5 MG TABS tablet  carbidopa-levodopa (SINEMET CR) 50-200 MG tablet  omeprazole (PRILOSEC) 20 MG capsule    Objective   Primary Survey: Blood pressure 120/76, pulse (!) 144, temperature 98.3 F (36.8 C), resp. rate (!) 21, SpO2 100%. Airway: Patent, protecting airway Breathing: Bilateral breath sounds, breathing spontaneously Circulation: Stable, Palpable peripheral pulses Disability: Moving all extremities,   GCS Eyes: 3 - Eyes open to verbal command, speech or shout  GCS Verbal: 5 - Oriented  GCS Motor: 6 - Obeys commands for movement  GCS 14 Environment/Exposure: Warm, dry  Secondary Survey: Head: Brusing, looks old Neck: c-collar Chest: Bilateral breath sounds, chest wall stable Abdomen: Soft, non-tender, non-distended Upper Extremities: Strength and sensation intact, palpable peripheral pulses Lower extremities: Strength and sensation intact, palpable peripheral pulses Back: No step offs or deformities, atraumatic Rectal: Deferred Psych: Normal mood and affect  No  results found for this or any previous visit (from the past 24 hours).  Imaging Orders         DG Chest Port 1 View         DG Pelvis Portable         CT Head Wo Contrast         CT Cervical Spine Wo Contrast      Assessment and Plan   John [name] is an 64 y.o. male who presented as a level 1 trauma after a Fall.  Injuries: No new injuries, recommend re-admission to medical service to attempt to identify cause of recurrent falls   Problems from recent hospitalization: Acute pulmonary embolism (HCC) Essential hypertension Gastroesophageal reflux disease without esophagitis Parkinson's disease with fluctuating manifestations (HCC) ARF (acute renal failure) (HCC) Hypokalemia Hyponatremia Maxillary fracture (HCC) Orbital fracture Anemia Frequent falls     Dispo - Medical readmission    Deward PARAS Foy, MD  [facility] Use [email or web address] to contact on call provider  New Patient Billing: [phone] - High MDM

## 2024-03-16 NOTE — ED Notes (Signed)
 Pt to CT at this time.

## 2024-03-16 NOTE — ED Notes (Signed)
 Brother at bedside left number for contact with changes.

## 2024-03-17 ENCOUNTER — Encounter (HOSPITAL_COMMUNITY): Payer: Self-pay | Admitting: Family Medicine

## 2024-03-17 ENCOUNTER — Observation Stay (HOSPITAL_COMMUNITY)

## 2024-03-17 DIAGNOSIS — R569 Unspecified convulsions: Secondary | ICD-10-CM | POA: Diagnosis not present

## 2024-03-17 DIAGNOSIS — R55 Syncope and collapse: Secondary | ICD-10-CM | POA: Diagnosis present

## 2024-03-17 LAB — BASIC METABOLIC PANEL WITH GFR
Anion gap: 9 (ref 5–15)
BUN: 11 mg/dL (ref 8–23)
CO2: 20 mmol/L — ABNORMAL LOW (ref 22–32)
Calcium: 8.3 mg/dL — ABNORMAL LOW (ref 8.9–10.3)
Chloride: 106 mmol/L (ref 98–111)
Creatinine, Ser: 1.22 mg/dL (ref 0.61–1.24)
GFR, Estimated: >60 mL/min (ref >60)
Glucose, Bld: 96 mg/dL (ref 70–99)
Potassium: 3.5 mmol/L (ref 3.5–5.1)
Sodium: 135 mmol/L (ref 135–145)

## 2024-03-17 LAB — CBC
HCT: 27.3 % — ABNORMAL LOW (ref 39.0–52.0)
Hemoglobin: 8.8 g/dL — ABNORMAL LOW (ref 13.0–17.0)
MCH: 27.7 pg (ref 26.0–34.0)
MCHC: 32.2 g/dL (ref 30.0–36.0)
MCV: 85.8 fL (ref 80.0–100.0)
Platelets: 292 K/uL (ref 150–400)
RBC: 3.18 MIL/uL — ABNORMAL LOW (ref 4.22–5.81)
RDW: 14.1 % (ref 11.5–15.5)
WBC: 6.4 K/uL (ref 4.0–10.5)
nRBC: 0 % (ref 0.0–0.2)

## 2024-03-17 LAB — HIV ANTIBODY (ROUTINE TESTING W REFLEX): HIV Screen 4th Generation wRfx: NONREACTIVE

## 2024-03-17 MED ORDER — APIXABAN 5 MG PO TABS
5.0000 mg | ORAL_TABLET | Freq: Two times a day (BID) | ORAL | Status: DC
  Administered 2024-03-18 – 2024-03-22 (×8): 5 mg via ORAL
  Filled 2024-03-17 (×8): qty 1

## 2024-03-17 MED ORDER — APIXABAN 5 MG PO TABS
10.0000 mg | ORAL_TABLET | Freq: Two times a day (BID) | ORAL | Status: AC
  Administered 2024-03-17 – 2024-03-18 (×3): 10 mg via ORAL
  Filled 2024-03-17 (×3): qty 2

## 2024-03-17 NOTE — Plan of Care (Signed)

## 2024-03-17 NOTE — Progress Notes (Signed)
 EEG complete - results pending

## 2024-03-17 NOTE — Progress Notes (Signed)
  Progress Note   Patient: John [name] FMW:[id number] DOB: January 09, 1961 DOA: 03/16/2024     0 DOS: the patient was seen and examined on 03/17/2024   Brief hospital course: 63 y.o. male with medical history significant for Parkinson disease, frequent falls with facial fractures, anemia, and bilateral pulmonary embolism who presents after an apparent seizure with fall. He was admitted to the hospital on 03/10/2024 with acute bilateral pulmonary emboli that appeared to be unprovoked, had echocardiogram with normal RV size and function, AKI and hyponatremia which resolved with IV fluid hydration, acute encephalopathy that resolved, and he was discharged to an SNF on Eliquis on 7/1 and then shortly brought to ED after a witnessed fall as a level 1 trauma.  Assessment and Plan:   Mechanical fall,POA: Patient did not lose consciousness. PT/OT consulted F/u orthostatic vitals.  Concern for Seizure: EEG is unremarkable. No need for initiation of AED as per neurology.  Pulmonary embolism,POA: -continue with eliquis  AKI,POA: Improving -Likely prerenal, continue BMP monitoring  Parkinson's disease,POA: Continue Sinement  Lactic acidosis: resolved with fluids. No evidence of active infection  Disposition: Back to [facility] SNF      Subjective: No acute issues. EEG done this morning is unremarkable. No episodes of seizures in the hospital. He said he went to the bathroom with physical therapist at the SNF and didn't feel good and fell. Denies loss of consciousness or seizures.  Physical Exam: Vitals:   03/17/24 0600 03/17/24 0645 03/17/24 1032 03/17/24 1033  BP: 130/81 119/78  (!) 133/93  Pulse: 82 84  86  Resp:    20  Temp:   98.4 F (36.9 C)   TempSrc:   Oral   SpO2: 100% 97%  99%  Weight:      Height:       Constitutional: NAD, calm, comfortable, bruising underneath right eye, right sided neck, right upper arm Eyes: PERRL, lids and conjunctivae normal ENMT: Mucous membranes are  moist. Posterior pharynx clear of any exudate or lesions.Normal dentition.  Neck: normal, supple, no masses, no thyromegaly Respiratory: clear to auscultation bilaterally, no wheezing, no crackles. Normal respiratory effort. No accessory muscle use.  Cardiovascular: Regular rate and rhythm, no murmurs / rubs / gallops. No extremity edema. 2+ pedal pulses. No carotid bruits.  Abdomen: no tenderness, no masses palpated. No hepatosplenomegaly. Bowel sounds positive.  Musculoskeletal: no clubbing / cyanosis. No joint deformity upper and lower extremities. Good ROM, no contractures. Normal muscle tone.  Skin: no rashes, lesions, ulcers. No induration Neurologic: CN 2-12 grossly intact. Sensation intact, DTR normal. Strength 5/5 x all 4 extremities.  Psychiatric: Normal judgment and insight. Alert and oriented x 3. Normal mood.   Data Reviewed:  There are no new results to review at this time.  Family Communication: None at bedside  Disposition: Status is: Observation The patient remains OBS appropriate and will d/c before 2 midnights.  Planned Discharge Destination: Skilled nursing facility    Time spent: 40 minutes  Author: Deliliah Room, MD 03/17/2024 1:01 PM  For on call review [email or web address].

## 2024-03-17 NOTE — Procedures (Signed)
 Patient Name: John [name]  MRN: [id number]  Epilepsy Attending: Arlin MALVA Krebs  Referring Physician/Provider: Ula Prentice SAUNDERS, MD  Date: 03/17/2024 Duration: 23.10 mins  Patient history: 63 year old male with seizure-like activity.  EEG to evaluate for seizure.  Level of alertness: Awake, drowsy  AEDs during EEG study: None   Technical aspects: This EEG study was done with scalp electrodes positioned according to the 10-20 International system of electrode placement. Electrical activity was reviewed with band pass filter of 1-70Hz , sensitivity of 7 uV/mm, display speed of 59mm/sec with a 60Hz  notched filter applied as appropriate. EEG data were recorded continuously and digitally stored.  Video monitoring was available and reviewed as appropriate.  Description: The posterior dominant rhythm consists of 8 Hz activity of moderate voltage (25-35 uV) seen predominantly in posterior head regions, symmetric and reactive to eye opening and eye closing. Drowsiness was characterized by attenuation of the posterior background rhythm. Physiologic photic driving was not seen during photic stimulation.  Hyperventilation was not performed.     IMPRESSION: This study is within normal limits. No seizures or epileptiform discharges were seen throughout the recording.  A normal interictal EEG does not exclude the diagnosis of epilepsy.   Oria Klimas O Sheyna Pettibone \

## 2024-03-18 DIAGNOSIS — R569 Unspecified convulsions: Secondary | ICD-10-CM | POA: Diagnosis not present

## 2024-03-18 LAB — BASIC METABOLIC PANEL WITH GFR
Anion gap: 10 (ref 5–15)
BUN: 14 mg/dL (ref 8–23)
CO2: 20 mmol/L — ABNORMAL LOW (ref 22–32)
Calcium: 8.8 mg/dL — ABNORMAL LOW (ref 8.9–10.3)
Chloride: 106 mmol/L (ref 98–111)
Creatinine, Ser: 1.16 mg/dL (ref 0.61–1.24)
GFR, Estimated: >60 mL/min (ref >60)
Glucose, Bld: 96 mg/dL (ref 70–99)
Potassium: 3.8 mmol/L (ref 3.5–5.1)
Sodium: 136 mmol/L (ref 135–145)

## 2024-03-18 LAB — CBC WITH DIFFERENTIAL/PLATELET
Abs Immature Granulocytes: 0.06 K/uL (ref 0.00–0.07)
Basophils Absolute: 0 K/uL (ref 0.0–0.1)
Basophils Relative: 1 %
Eosinophils Absolute: 0.5 K/uL (ref 0.0–0.5)
Eosinophils Relative: 6 %
HCT: 26.9 % — ABNORMAL LOW (ref 39.0–52.0)
Hemoglobin: 8.6 g/dL — ABNORMAL LOW (ref 13.0–17.0)
Immature Granulocytes: 1 %
Lymphocytes Relative: 21 %
Lymphs Abs: 1.5 K/uL (ref 0.7–4.0)
MCH: 27.6 pg (ref 26.0–34.0)
MCHC: 32 g/dL (ref 30.0–36.0)
MCV: 86.2 fL (ref 80.0–100.0)
Monocytes Absolute: 0.7 K/uL (ref 0.1–1.0)
Monocytes Relative: 9 %
Neutro Abs: 4.6 K/uL (ref 1.7–7.7)
Neutrophils Relative %: 62 %
Platelets: 278 K/uL (ref 150–400)
RBC: 3.12 MIL/uL — ABNORMAL LOW (ref 4.22–5.81)
RDW: 14.6 % (ref 11.5–15.5)
WBC: 7.4 K/uL (ref 4.0–10.5)
nRBC: 0 % (ref 0.0–0.2)

## 2024-03-18 NOTE — Progress Notes (Signed)
 PT Cancellation Note  Patient Details Name: John [name] MRN: [id number] DOB: May 24, 1961   Cancelled Treatment:    Reason Eval/Treat Not Completed: Other (comment). PT entering pt's room and noting pt to be very confused with an alarm belt attached to him while supine in bed. Pt with tangential conversation with PT's general questions. Pt's RN entering room and requesting PT to not work with pt at this time as pt has been very confused and attempting to get out of the recliner chair earlier. RN reporting that staff had to catch him twice as he was attempting to get out of the chair in an unsafe manner. PT will continue to follow-up with pt acutely as available and appropriate.    [name] HERO Enzo Treu 03/18/2024, 12:00 PM

## 2024-03-18 NOTE — Progress Notes (Signed)
  Progress Note   Patient: John [name] FMW:[id number] DOB: 1961/02/09 DOA: 03/16/2024     1 DOS: the patient was seen and examined on 03/18/2024   Brief hospital course: 63 y.o. male with medical history significant for Parkinson disease, frequent falls with facial fractures, anemia, and bilateral pulmonary embolism who presents after an apparent seizure with fall. He was admitted to the hospital on 03/10/2024 with acute bilateral pulmonary emboli that appeared to be unprovoked, had echocardiogram with normal RV size and function, AKI and hyponatremia which resolved with IV fluid hydration, acute encephalopathy that resolved, and he was discharged to an SNF on Eliquis on 7/1 and then shortly brought to ED after a witnessed fall as a level 1 trauma.  Assessment and Plan:   Mechanical fall,POA: Patient did not lose consciousness. PT/OT consulted F/u orthostatic vitals. Likely physical deconditioning Patient had an assisted fall on the morning of 7/3. He didn't hit his head.  Concern for Seizure: ruled out. EEG is unremarkable. No need for initiation of AED as per neurology. No episodes of seizures in the hospital.  Pulmonary embolism,POA: -continue with eliquis  AKI,POA: Improving. Received IVF. -Likely prerenal, continue BMP monitoring  Parkinson's disease,POA: Continue Sinement  Lactic acidosis: resolved with fluids. No evidence of active infection  Disposition: Back to [facility] SNF      Subjective: Patient had an assisted fall this am. He is on fall precautions. He appears disoriented. Family present at the bedside.  Physical Exam: Vitals:   03/17/24 2050 03/17/24 2330 03/18/24 0450 03/18/24 0550  BP: 125/79 114/82 128/75   Pulse: 90 92 86   Resp: 16 19 20    Temp: 98 F (36.7 C) 98.1 F (36.7 C) 98 F (36.7 C)   TempSrc: Oral Oral Oral   SpO2: 97% 97%  98%  Weight:      Height:       Constitutional: NAD, calm, comfortable, bruising underneath right eye, right  sided neck, right upper arm Eyes: PERRL, lids and conjunctivae normal ENMT: Mucous membranes are moist. Posterior pharynx clear of any exudate or lesions.Normal dentition.  Neck: normal, supple, no masses, no thyromegaly Respiratory: clear to auscultation bilaterally, no wheezing, no crackles. Normal respiratory effort. No accessory muscle use.  Cardiovascular: Regular rate and rhythm, no murmurs / rubs / gallops. No extremity edema. 2+ pedal pulses. No carotid bruits.  Abdomen: no tenderness, no masses palpated. No hepatosplenomegaly. Bowel sounds positive.  Musculoskeletal: no clubbing / cyanosis. No joint deformity upper and lower extremities. Good ROM, no contractures. Normal muscle tone.  Skin: no rashes, lesions, ulcers. No induration Neurologic: CN 2-12 grossly intact. Sensation intact, DTR normal. Strength 5/5 x all 4 extremities.  Psychiatric: Alert and oriented x 2. Normal mood.   Data Reviewed:  There are no new results to review at this time.  Family Communication: Bedside  Disposition: Status is: Inpt  Planned Discharge Destination: Skilled nursing facility    Time spent: 39 minutes  Author: Deliliah Room, MD 03/18/2024 11:58 AM  For on call review [email or web address].

## 2024-03-18 NOTE — TOC Initial Note (Addendum)
 Transition of Care [facility]) - Initial/Assessment Note    Patient Details  Name: John [name] MRN: [id number] Date of Birth: 11-08-1960  Transition of [facility]) CM/SW Contact:    John [name], LCSWA Phone Number: 03/18/2024, 2:59 PM  Clinical Narrative:                    Patient is from [name] short term. Due to patients current orientation. CSW LVM for patients sister John [name]. CSW awaiting PT/OT eval/recommendations. CSW will continue to follow and assist with patients dc planning needs.  Update- CSW received call back from patients sister John [name] who confirmed patient is from [name] short term. John [name] confirmed that she would like for patient to return to SNF for short term rehab when medically ready for dc. CSW informed patients sister that CSW awaiting PT/OT evals, and will follow up once recommendations are in. All questions answered. No further questions reported at this time.CSW will continue to follow.     Patient Goals and CMS Choice            Expected Discharge Plan and Services                                              Prior Living Arrangements/Services                       Activities of Daily Living   ADL Screening (condition at time of admission) Independently performs ADLs?: No Does the patient have a NEW difficulty with bathing/dressing/toileting/self-feeding that is expected to last >3 days?: Yes (Initiates electronic notice to provider for possible OT consult) Does the patient have a NEW difficulty with getting in/out of bed, walking, or climbing stairs that is expected to last >3 days?: Yes (Initiates electronic notice to provider for possible PT consult) Does the patient have a NEW difficulty with communication that is expected to last >3 days?: No Is the patient deaf or have difficulty hearing?: No Does the patient have difficulty seeing, even when wearing glasses/contacts?: No Does the patient have difficulty  concentrating, remembering, or making decisions?: No  Permission Sought/Granted                  Emotional Assessment              Admission diagnosis:  Sinus tachycardia [R00.0] Lactic acidosis [E87.20] Transient loss of consciousness [R55] Observed seizure-like activity (HCC) [R56.9] Patient Active Problem List   Diagnosis Date Noted   Transient loss of consciousness 03/17/2024   First time seizure (HCC) 03/16/2024   AKI (acute kidney injury) (HCC) 03/16/2024   Bilateral pulmonary embolism (HCC) 03/16/2024   Normocytic anemia 03/16/2024   Parkinson disease (HCC) 03/16/2024   Lactic acidosis 03/16/2024   PCP:  [name], MD Pharmacy:   [facility] [address], [place] - [address] [place] [address] Phone: [phone] Fax: [phone]     Social Drivers of Health (SDOH) Social History: SDOH Screenings   Food Insecurity: No Food Insecurity (03/17/2024)  Housing: Low Risk  (03/17/2024)  Transportation Needs: No Transportation Needs (03/17/2024)  Utilities: Not At Risk (03/17/2024)  Tobacco Use: Medium Risk (03/17/2024)   SDOH Interventions:     Readmission Risk Interventions     No data to display

## 2024-03-18 NOTE — Plan of Care (Signed)

## 2024-03-18 NOTE — TOC CAGE-AID Note (Signed)
 Transition of Care [facility]) - CAGE-AID Screening   Patient Details  Name: John [name] MRN: [id number] Date of Birth: 1961-07-14  [facility]) CM/SW Contact:    Gunda Maqueda E Adelee Hannula, LCSW Phone Number: 03/18/2024, 9:17 AM   Clinical Narrative: Disoriented x 2.   CAGE-AID Screening: Substance Abuse Screening unable to be completed due to: : Patient unable to participate

## 2024-03-18 NOTE — Progress Notes (Addendum)
 Pt had an assisted witness fall , he was lowered to the floor mat by the witness, all fall risk boundle were in place,  VSS, small abrasion   to left elbow, attending and family notified, (see post fall flow sheet). Will finish all documentation, including safety zone before I clocked out.

## 2024-03-18 NOTE — Progress Notes (Signed)
 Mobility Specialist Progress Note;   03/18/24 1002  Mobility  Activity Transferred from chair to bed  Level of Assistance +2 (takes two people)  Press photographer wheel walker  Distance Ambulated (ft) 3 ft  Activity Response Tolerated well  Mobility Referral Yes  Mobility visit 1 Mobility  Mobility Specialist Start Time (ACUTE ONLY) 1002  Mobility Specialist Stop Time (ACUTE ONLY) 1010  Mobility Specialist Time Calculation (min) (ACUTE ONLY) 8 min   Pt attempting to get out of chair w/o assist, RN requesting assistance transferring pt back to bed. Required MinA +2 to safely transfer. Pt left in bed with all needs met, alarm on. RN in room.   Lauraine Erm Mobility Specialist Please contact via SecureChat or [employer] [phone]

## 2024-03-18 NOTE — Progress Notes (Signed)
 Overnight floor coverage  Informed by RN that patient had a witnessed fall and was lowered to the floor.  Patient did not hit his head or sustain any major injuries.  Abrasion noted to the left elbow but not complaining of any pain.  No loss of consciousness or seizure-like activity reported.  Vital signs stable.  -PT/OT eval, fall precautions

## 2024-03-18 NOTE — Evaluation (Signed)
 Occupational Therapy Evaluation Patient Details Name: John [name] MRN: [id number] DOB: 06-Feb-1961 Today's Date: 03/18/2024   History of Present Illness   Pt is a 63 y.o. male admitted 7/1 for witnessed seizure. Pt was d/c'd to snf earlier in day after R sided facial fxs and multiple rib fxs. Acute bil pulmonary emboli discovered 6/25. EEG on 7/2 within normal limits. PMH: parkinson's, HTN, CVA     Clinical Impressions Pt admitted based on above, and was seen based on problem list below. Prior to recent hospitalizations pt reports independence with ADLs, with a history of 3 falls per weeks. Today pt is requiring set up  to mod assist for  ADLs. Bed mobility and functional transfers are up to mod assist. Pt with cog decline from previous admission. Pt not oriented to time or situation, and believes he is in the hospital in [place], but will make statements such as I left my sandwich downstairs in the kitchen. Poor insight into deficits and limitations, making him a significant fall risk.  Pt would benefit from return back to <3 hours of skilled rehab daily. OT will continue to follow acutely to maximize functional independence.     If plan is discharge home, recommend the following:   A lot of help with walking and/or transfers;A little help with bathing/dressing/bathroom     Functional Status Assessment   Patient has had a recent decline in their functional status and demonstrates the ability to make significant improvements in function in a reasonable and predictable amount of time.     Equipment Recommendations   Other (comment) (Defer to next venue)      Precautions/Restrictions   Precautions Precautions: Fall Recall of Precautions/Restrictions: Impaired Restrictions Weight Bearing Restrictions Per Provider Order: No     Mobility Bed Mobility Overal bed mobility: Needs Assistance Bed Mobility: Supine to Sit     Supine to sit: Mod assist, HOB elevated,  Used rails     General bed mobility comments: Pt requesting HH assist, mod assist for trunk    Transfers Overall transfer level: Needs assistance Equipment used: Rolling walker (2 wheels) Transfers: Sit to/from Stand, Bed to chair/wheelchair/BSC Sit to Stand: Min assist     Step pivot transfers: Mod assist, From elevated surface     General transfer comment: Min assist to steady in standing, during functional transfer mod assist d/t posterior lean      Balance Overall balance assessment: Needs assistance Sitting-balance support: Feet supported, Bilateral upper extremity supported Sitting balance-Leahy Scale: Poor Sitting balance - Comments: denies any blurred vision or dizziness but noted posterior lean during visual tracking Postural control: Posterior lean Standing balance support: Bilateral upper extremity supported, During functional activity, Reliant on assistive device for balance Standing balance-Leahy Scale: Poor Standing balance comment: Reliant on RW and posterior lean       ADL either performed or assessed with clinical judgement   ADL Overall ADL's : Needs assistance/impaired Eating/Feeding: Set up;Sitting   Grooming: Set up;Sitting   Upper Body Bathing: Set up;Sitting   Lower Body Bathing: Moderate assistance;Sit to/from stand   Upper Body Dressing : Set up;Sitting   Lower Body Dressing: Moderate assistance;Sit to/from stand   Toilet Transfer: Moderate assistance;Stand-pivot;Cueing for safety;Cueing for sequencing;Rolling walker (2 wheels) Toilet Transfer Details (indicate cue type and reason): Simulated in room Toileting- Clothing Manipulation and Hygiene: Moderate assistance;Sit to/from stand       Functional mobility during ADLs: Moderate assistance;Rolling walker (2 wheels);Cueing for sequencing;Cueing for safety General ADL Comments: Pt requiring  up to mod assist for LB ADLs d/t poor balance     Vision   Vision Assessment?: Vision impaired-  to be further tested in functional context Additional Comments: Pt reports no change in vision, difficult  scanning R side, but noted signs of dizziness during visual tracking            Pertinent Vitals/Pain Pain Assessment Pain Assessment: No/denies pain     Extremity/Trunk Assessment Upper Extremity Assessment Upper Extremity Assessment: Generalized weakness   Lower Extremity Assessment Lower Extremity Assessment: Defer to PT evaluation   Cervical / Trunk Assessment Cervical / Trunk Assessment: Normal   Communication Communication Communication: No apparent difficulties   Cognition Arousal: Alert Behavior During Therapy: Impulsive, WFL for tasks assessed/performed Cognition: Cognition impaired   Orientation impairments: Place, Time, Situation Awareness: Intellectual awareness impaired, Online awareness impaired Memory impairment (select all impairments): Working [employer] fast streamer, Conservation officer, historic buildings (Pt able to complete short recall but unable to recall time at [name] and reason for admission) Attention impairment (select first level of impairment): Sustained attention Executive functioning impairment (select all impairments): Reasoning, Problem solving, Sequencing OT - Cognition Comments: Pt not oriented to time, stating he believes today is Saturday and that it is 2:30pm, poor attention, awareness and insight into deficits       Following commands: Impaired Following commands impaired: Follows one step commands inconsistently, Follows one step commands with increased time     Cueing  General Comments   Cueing Techniques: Verbal cues  Bruising noted near R eye, denies any blurred vision, BP stable on RA           Home Living Family/patient expects to be discharged to:: Skilled nursing facility   Additional Comments: Pt was beginning to complete STR at [name]      Prior Functioning/Environment Prior Level of Function : Independent/Modified  Independent;History of Falls (last six months)       Mobility Comments: Reports primarily using cane, but falls 3x per week ADLs Comments: No assist, has fallen in shower previously    OT Problem List: Decreased strength;Decreased range of motion;Decreased activity tolerance;Impaired balance (sitting and/or standing);Decreased cognition;Impaired vision/perception;Decreased safety awareness;Cardiopulmonary status limiting activity   OT Treatment/Interventions: Self-care/ADL training;Therapeutic exercise;Energy conservation;DME and/or AE instruction;Therapeutic activities;Visual/perceptual remediation/compensation;Patient/family education;Balance training;Cognitive remediation/compensation      OT Goals(Current goals can be found in the care plan section)   Acute Rehab OT Goals Patient Stated Goal: To get a diet coke OT Goal Formulation: With patient Time For Goal Achievement: 04/01/24 Potential to Achieve Goals: Good   OT Frequency:  Min 2X/week       AM-PAC OT 6 Clicks Daily Activity     Outcome Measure Help from another person eating meals?: None Help from another person taking care of personal grooming?: A Little Help from another person toileting, which includes using toliet, bedpan, or urinal?: A Lot Help from another person bathing (including washing, rinsing, drying)?: A Lot Help from another person to put on and taking off regular upper body clothing?: A Little Help from another person to put on and taking off regular lower body clothing?: A Lot 6 Click Score: 16   End of Session Equipment Utilized During Treatment: Gait belt;Rolling walker (2 wheels) Nurse Communication: Mobility status  Activity Tolerance: Patient tolerated treatment well Patient left: in chair;with call bell/phone within reach;with chair alarm set  OT Visit Diagnosis: Unsteadiness on feet (R26.81);Other abnormalities of gait and mobility (R26.89)  Time: 9243-9169 OT Time  Calculation (min): 34 min Charges:  OT General Charges $OT Visit: 1 Visit OT Evaluation $OT Eval Moderate Complexity: 1 Mod OT Treatments $Self Care/Home Management : 8-22 mins  Adrianne BROCKS, OT  Acute Rehabilitation Services Office [phone] Secure chat preferred   Adrianne GORMAN Savers 03/18/2024, 9:04 AM

## 2024-03-19 DIAGNOSIS — R569 Unspecified convulsions: Secondary | ICD-10-CM | POA: Diagnosis not present

## 2024-03-19 NOTE — Evaluation (Addendum)
 Physical Therapy Evaluation Patient Details Name: John [name]  MRN: [id number] DOB: 10/27/1960 Today's Date: 03/19/2024  History of Present Illness  Pt is a 63 y.o. male admitted 7/1 for witnessed seizure. Pt was d/c'd to snf earlier in day after R sided facial fxs and multiple rib fxs. Acute bil pulmonary emboli discovered 6/25. EEG on 7/2 within normal limits. PMH: parkinson's, HTN, CVA  Clinical Impression  Pt admitted with/for fall, potential seizure.  Pt needing moderate or more cuing for safety/direction and min to light max assist for basic mobility to gait stability.  Pt currently limited functionally due to the problems listed. ( See problems list.)   Pt will benefit from PT to maximize function and safety in order to get ready for next venue listed below.  Patient will benefit from continued inpatient follow up therapy, <3 hours/day.          If plan is discharge home, recommend the following: A lot of help with bathing/dressing/bathroom;A little help with walking and/or transfers;Assist for transportation;Help with stairs or ramp for entrance;Supervision due to cognitive status   Can travel by private vehicle   No    Equipment Recommendations Other (comment) (Will need an AD with more stability, TBD)  Recommendations for Other Services       Functional Status Assessment Patient has had a recent decline in their functional status and demonstrates the ability to make significant improvements in function in a reasonable and predictable amount of time.     Precautions / Restrictions Precautions Precautions: Fall Recall of Precautions/Restrictions: Impaired Restrictions Weight Bearing Restrictions Per Provider Order: No      Mobility  Bed Mobility Overal bed mobility: Needs Assistance Bed Mobility: Supine to Sit     Supine to sit: Mod assist, HOB elevated, Used rails     General bed mobility comments: Pt requesting HH assist, mod assist for trunk     Transfers Overall transfer level: Needs assistance Equipment used: Rolling walker (2 wheels) Transfers: Sit to/from Stand, Bed to chair/wheelchair/BSC Sit to Stand: Min assist   Step pivot transfers: Mod assist, From elevated surface       General transfer comment: Min assist to steady in standing, during functional transfer mod assist d/t posterior lean    Ambulation/Gait Ambulation/Gait assistance: Min assist, Mod assist Gait Distance (Feet): 140 Feet Assistive device: 1 person hand held assist Gait Pattern/deviations: Step-through pattern, Decreased stride length Gait velocity: slower Gait velocity interpretation: <1.8 ft/sec, indicate of risk for recurrent falls   General Gait Details: overall uncoordinated steps L worse than R, pt using therapist's assist moderately to attempt steadying.  Quad cane, pt's choice of AD would not be sufficient.  Stairs            Wheelchair Mobility     Tilt Bed    Modified Rankin (Stroke Patients Only)       Balance Overall balance assessment: Needs assistance Sitting-balance support: Feet supported, Bilateral upper extremity supported, No upper extremity supported Sitting balance-Leahy Scale: Fair     Standing balance support: Bilateral upper extremity supported, During functional activity, Reliant on assistive device for balance Standing balance-Leahy Scale: Poor Standing balance comment: Reliant on RW and posterior lean                             Pertinent Vitals/Pain      Home Living Family/patient expects to be discharged to:: Skilled nursing facility  Additional Comments: Pt was beginning to complete STR at [facility]    Prior Function Prior Level of Function : Independent/Modified Independent;History of Falls (last six months)             Mobility Comments: Reports primarily using cane, but falls 3x per week ADLs Comments: No assist, has fallen in shower  previously     Extremity/Trunk Assessment   Upper Extremity Assessment Upper Extremity Assessment: Overall WFL for tasks assessed    Lower Extremity Assessment Lower Extremity Assessment: Generalized weakness (L>R le incoordination in part due to parkinsons)    Cervical / Trunk Assessment Cervical / Trunk Assessment: Normal  Communication   Communication Communication: No apparent difficulties    Cognition Arousal: Alert Behavior During Therapy: Impulsive, WFL for tasks assessed/performed   PT - Cognitive impairments: Orientation, Awareness, Memory   Orientation impairments: Situation, Place, Time                     Following commands: Impaired Following commands impaired: Follows one step commands inconsistently, Follows one step commands with increased time     Cueing Cueing Techniques: Verbal cues     General Comments General comments (skin integrity, edema, etc.): VSS.  Pt could not remember his orientation multiple times within a session, but knew parts of what was said.  After 1+ min out of room, pt had gotten up and transferred back to bed on impulse, setting off the alarm.  In general, needing moderate or more cues for safety    Exercises     Assessment/Plan    PT Assessment Patient needs continued PT services  PT Problem List Decreased activity tolerance;Decreased balance;Decreased mobility;Decreased coordination;Decreased cognition;Decreased safety awareness       PT Treatment Interventions DME instruction;Gait training;Functional mobility training;Therapeutic activities;Balance training;Patient/family education    PT Goals (Current goals can be found in the Care Plan section)  Acute Rehab PT Goals Patient Stated Goal: stop falling so much PT Goal Formulation: With patient Time For Goal Achievement: 04/02/24 Potential to Achieve Goals: Fair    Frequency Min 2X/week     Co-evaluation               AM-PAC PT 6 Clicks Mobility   Outcome Measure Help needed turning from your back to your side while in a flat bed without using bedrails?: A Little Help needed moving from lying on your back to sitting on the side of a flat bed without using bedrails?: A Little Help needed moving to and from a bed to a chair (including a wheelchair)?: A Lot Help needed standing up from a chair using your arms (e.g., wheelchair or bedside chair)?: A Little Help needed to walk in hospital room?: A Lot Help needed climbing 3-5 steps with a railing? : A Lot 6 Click Score: 15    End of Session   Activity Tolerance: Patient tolerated treatment well Patient left: in chair;with call bell/phone within reach;with chair alarm set;Other (comment) Nurse Communication: Mobility status PT Visit Diagnosis: Unsteadiness on feet (R26.81);Other abnormalities of gait and mobility (R26.89);Other symptoms and signs involving the nervous system (R29.898)    Time: 8944-8866 PT Time Calculation (min) (ACUTE ONLY): 38 min   Charges:   PT Evaluation $PT Eval Moderate Complexity: 1 Mod PT Treatments $Gait Training: 8-22 mins PT General Charges $$ ACUTE PT VISIT: 1 Visit         03/19/2024  India HERO., PT Acute Rehabilitation Services [phone]  (office)  Vinie GAILS Markesha Hannig 03/19/2024, 11:52  AM

## 2024-03-19 NOTE — Progress Notes (Signed)
  Progress Note   Patient: John [name] FMW:[id number] DOB: 26-Jun-1961 DOA: 03/16/2024     2 DOS: the patient was seen and examined on 03/19/2024   Brief hospital course: 63 y.o. male with medical history significant for Parkinson disease, frequent falls with facial fractures, anemia, and bilateral pulmonary embolism who presents after an apparent seizure with fall. He was admitted to the hospital on 03/10/2024 with acute bilateral pulmonary emboli that appeared to be unprovoked, had echocardiogram with normal RV size and function, AKI and hyponatremia which resolved with IV fluid hydration, acute encephalopathy that resolved, and he was discharged to an SNF on Eliquis  on 7/1 and then shortly brought to ED after a witnessed fall as a level 1 trauma.  Assessment and Plan:   Mechanical fall,POA: Patient did not lose consciousness. PT/OT consulted F/u orthostatic vitals. Likely physical deconditioning Patient had an assisted fall on the morning of 7/3. He didn't hit his head. SNF on discharge  Concern for Seizure: ruled out. EEG is unremarkable. No need for initiation of AED as per neurology. No episodes of seizures in the hospital.  Pulmonary embolism,POA: -continue with eliquis   Physical deconditioning with multiple mechanical falls,POA: Continue with PT OT  AKI,POA: Improving. Received IVF. -Likely prerenal, continue BMP monitoring  Parkinson's disease,POA: Continue Sinement  Lactic acidosis: resolved with fluids. No evidence of active infection  Disposition: SNF, case management,PT OT on board.      Subjective:He slept well last night. He said he has an appointment with his orthopedics at 2 pm. He does have arthritis involving various joints of his body.  Physical Exam: Vitals:   03/19/24 0400 03/19/24 0758 03/19/24 0802 03/19/24 0919  BP: 122/76 (!) 137/91 127/81   Pulse:  88    Resp: (!) 22 20    Temp:  98 F (36.7 C)    TempSrc:  Axillary    SpO2:    98%  Weight:       Height:       Constitutional: NAD, calm, comfortable, bruising underneath right eye, right sided neck, right upper arm Eyes: PERRL, lids and conjunctivae normal ENMT: Mucous membranes are moist. Posterior pharynx clear of any exudate or lesions.Normal dentition.  Neck: normal, supple, no masses, no thyromegaly Respiratory: clear to auscultation bilaterally, no wheezing, no crackles. Normal respiratory effort. No accessory muscle use.  Cardiovascular: Regular rate and rhythm, no murmurs / rubs / gallops. No extremity edema. 2+ pedal pulses. No carotid bruits.  Abdomen: no tenderness, no masses palpated. No hepatosplenomegaly. Bowel sounds positive.  Musculoskeletal: no clubbing / cyanosis. No joint deformity upper and lower extremities. Good ROM, no contractures. Normal muscle tone.  Skin: no rashes, lesions, ulcers. No induration Neurologic: CN 2-12 grossly intact. Sensation intact, DTR normal. Strength 5/5 x all 4 extremities.  Psychiatric: Alert and oriented x 2. Normal mood.   Data Reviewed:  There are no new results to review at this time.  Family Communication: None at bedside  Disposition: Status is: Inpt  Planned Discharge Destination: Skilled nursing facility    Time spent: 39 minutes  Author: Deliliah Room, MD 03/19/2024 10:30 AM  For on call review [email or web address].

## 2024-03-19 NOTE — TOC Progression Note (Addendum)
 Transition of Care [facility]) - Progression Note    Patient Details  Name: John [name] MRN: [id number] Date of Birth: 1961/02/27  [facility]) CM/SW Contact  Isaiah Public, LCSWA Phone Number: 03/19/2024, 1:32 PM  Clinical Narrative:     CSW received consult for possible SNF placement at time of discharge. Due to patients current orientation, CSW spoke with patients sister [name] regarding PT recommendation of SNF placement for patient at time of discharge.  Patients sister expressed understanding of PT recommendation and is agreeable to SNF placement  for patient at time of discharge. Patients sister confirmed plan is for patient to return back to [name] for short term rehab. CSW discussed insurance authorization process. All questions answered. No further questions reported at this time. CSW spoke with Leontine with [name] who confirmed SNF bed for patient. Leontine confirmed she started insurance authorization and that insurance authorization for SNF currently pending.CSW to continue to follow and assist with discharge planning needs.   Expected Discharge Plan: Skilled Nursing Facility Barriers to Discharge: Continued Medical Work up  Expected Discharge Plan and Services In-house Referral: Clinical Social Work     Living arrangements for the past 2 months:  (from [name] short term rehab)                                       Social Determinants of Health (SDOH) Interventions SDOH Screenings   Food Insecurity: No Food Insecurity (03/17/2024)  Housing: Low Risk  (03/17/2024)  Transportation Needs: No Transportation Needs (03/17/2024)  Utilities: Not At Risk (03/17/2024)  Tobacco Use: Medium Risk (03/17/2024)    Readmission Risk Interventions     No data to display

## 2024-03-20 DIAGNOSIS — R569 Unspecified convulsions: Secondary | ICD-10-CM | POA: Diagnosis not present

## 2024-03-20 NOTE — Progress Notes (Signed)
  Progress Note   Patient: John [name] FMW:[id number] DOB: October 24, 1960 DOA: 03/16/2024     3 DOS: the patient was seen and examined on 03/20/2024   Brief hospital course: 63 y.o. male with medical history significant for Parkinson disease, frequent falls with facial fractures, anemia, and bilateral pulmonary embolism who presents after an apparent seizure with fall. He was admitted to the hospital on 03/10/2024 with acute bilateral pulmonary emboli that appeared to be unprovoked, had echocardiogram with normal RV size and function, AKI and hyponatremia which resolved with IV fluid hydration, acute encephalopathy that resolved, and he was discharged to an SNF on Eliquis  on 7/1 and then shortly brought to ED after a witnessed fall as a level 1 trauma.  Assessment and Plan:   Mechanical fall,POA: Patient did not lose consciousness. PT/OT consulted F/u orthostatic vitals. Likely physical deconditioning Patient had an assisted fall on the morning of 7/3. He didn't hit his head. SNF on discharge  Concern for Seizure: ruled out. EEG is unremarkable. No need for initiation of AED as per neurology. No episodes of seizures in the hospital.  Pulmonary embolism,POA: -continue with eliquis   Physical deconditioning with multiple mechanical falls,POA: Continue with PT OT  AKI,POA: Improving. Received IVF. -Likely prerenal, continue BMP monitoring  Parkinson's disease,POA: Continue Sinement  Lactic acidosis: resolved with fluids. No evidence of active infection  Disposition: SNF, pending auth. case management,PT OT on board.      Subjective: No further falls overnight. No acute issues, He did ask me when can he go back to facility.  Physical Exam: Vitals:   03/19/24 2016 03/20/24 0017 03/20/24 0500 03/20/24 0745  BP: 111/74 129/89 115/82   Pulse: 93 89 92   Resp: 19 18 18 16   Temp: 98.2 F (36.8 C) 97.9 F (36.6 C) 97.9 F (36.6 C) 98.2 F (36.8 C)  TempSrc: Axillary Axillary  Axillary Axillary  SpO2:  94% 96%   Weight:      Height:       Constitutional: Appears drowsy but easy to arouse, bruising underneath right eye, right sided neck, right upper arm Eyes: PERRL, lids and conjunctivae normal ENMT: Mucous membranes are moist. Posterior pharynx clear of any exudate or lesions.Normal dentition.  Neck: normal, supple, no masses, no thyromegaly Respiratory: clear to auscultation bilaterally, no wheezing, no crackles. Normal respiratory effort. No accessory muscle use.  Cardiovascular: Regular rate and rhythm, no murmurs / rubs / gallops. No extremity edema. 2+ pedal pulses. No carotid bruits.  Abdomen: no tenderness, no masses palpated. No hepatosplenomegaly. Bowel sounds positive.  Musculoskeletal: no clubbing / cyanosis. No joint deformity upper and lower extremities. Good ROM, no contractures. Normal muscle tone.  Skin: no rashes, lesions, ulcers. No induration Neurologic: CN 2-12 grossly intact. Sensation intact, DTR normal. Strength 5/5 x all 4 extremities.  Psychiatric: Alert and oriented x 2. Normal mood.   Data Reviewed:  There are no new results to review at this time.  Family Communication: None at bedside  Disposition: Status is: Inpt  Planned Discharge Destination: Skilled nursing facility    Time spent: 39 minutes  Author: Deliliah Room, MD 03/20/2024 10:19 AM  For on call review [email or web address].

## 2024-03-20 NOTE — TOC Progression Note (Signed)
 Transition of Care [facility]) - Progression Note    Patient Details  Name: John [name] MRN: [id number] Date of Birth: 1961/04/28  Transition [facility] Care [facility]) CM/SW Contact  Olam FORBES Ally, LCSW Phone Number: 03/20/2024, 10:15 AM  Clinical Narrative:     CSW was alerted that patient was medically ready for DC. CSW spoke with Leontine at [facility] to ascertain if pt's shara has come back. Leontine stated that patient's authorization is still pending and it will most likely be Monday however will follow up if sooner.  TOC team will continue to assist with discharge planning needs.   Expected Discharge Plan: Skilled Nursing Facility Barriers to Discharge: Continued Medical Work up  Expected Discharge Plan and Services In-house Referral: Clinical Social Work     Living arrangements for the past 2 months:  (from [name] short term rehab)                                       Social Determinants of Health (SDOH) Interventions SDOH Screenings   Food Insecurity: No Food Insecurity (03/17/2024)  Housing: Low Risk  (03/17/2024)  Transportation Needs: No Transportation Needs (03/17/2024)  Utilities: Not At Risk (03/17/2024)  Tobacco Use: Medium Risk (03/17/2024)    Readmission Risk Interventions     No data to display

## 2024-03-21 DIAGNOSIS — R569 Unspecified convulsions: Secondary | ICD-10-CM | POA: Diagnosis not present

## 2024-03-21 NOTE — Progress Notes (Signed)
  Progress Note   Patient: John [name] FMW:[id number] DOB: 1961-04-15 DOA: 03/16/2024     4 DOS: the patient was seen and examined on 03/21/2024   Brief hospital course: 63 y.o. male with medical history significant for Parkinson disease, frequent falls with facial fractures, anemia, and bilateral pulmonary embolism who presents after an apparent seizure with fall. He was admitted to the hospital on 03/10/2024 with acute bilateral pulmonary emboli that appeared to be unprovoked, had echocardiogram with normal RV size and function, AKI and hyponatremia which resolved with IV fluid hydration, acute encephalopathy that resolved, and he was discharged to an SNF on Eliquis  on 7/1 and then shortly brought to ED after a witnessed fall as a level 1 trauma.  Assessment and Plan:   Mechanical fall,POA: Patient did not lose consciousness. PT/OT consulted F/u orthostatic vitals. Likely physical deconditioning Patient had an assisted fall on the morning of 7/3. He didn't hit his head. SNF on discharge  Concern for Seizure: ruled out. EEG is unremarkable. No need for initiation of AED as per neurology. No episodes of seizures in the hospital.  Pulmonary embolism,POA: -continue with eliquis   Physical deconditioning with multiple mechanical falls,POA: Continue with PT OT  AKI,POA: Improving. Received IVF. -Likely prerenal, continue BMP monitoring  Parkinson's disease,POA: Continue Sinement  Lactic acidosis: resolved with fluids. No evidence of active infection  Disposition: SNF, pending auth. case management,PT OT on board.      Subjective: Looks much more awake and alert this morning. He was having breakfast in bed. He wants to be discharged and I told him that we are waiting on insurance auth before he can go to SNF.  Physical Exam: Vitals:   03/20/24 1941 03/20/24 2259 03/21/24 0442 03/21/24 0833  BP: 114/72 122/79 (!) 145/83   Pulse: 98 87 98   Resp: 19 18 16 17   Temp: (!) 97.4 F  (36.3 C) (!) 96.7 F (35.9 C) 98.1 F (36.7 C) 97.8 F (36.6 C)  TempSrc: Oral Oral Oral Oral  SpO2: 100% 98%    Weight:      Height:       Constitutional: Alert and awake, bruising underneath right eye, right sided neck, right upper arm Eyes: PERRL, lids and conjunctivae normal ENMT: Mucous membranes are moist. Posterior pharynx clear of any exudate or lesions.Normal dentition.  Neck: normal, supple, no masses, no thyromegaly Respiratory: clear to auscultation bilaterally, no wheezing, no crackles. Normal respiratory effort. No accessory muscle use.  Cardiovascular: Regular rate and rhythm, no murmurs / rubs / gallops. No extremity edema. 2+ pedal pulses. No carotid bruits.  Abdomen: no tenderness, no masses palpated. No hepatosplenomegaly. Bowel sounds positive.  Musculoskeletal: no clubbing / cyanosis. No joint deformity upper and lower extremities. Good ROM, no contractures. Normal muscle tone.  Skin: no rashes, lesions, ulcers. No induration Neurologic: CN 2-12 grossly intact. Sensation intact, DTR normal. Strength 5/5 x all 4 extremities.  Psychiatric: Alert and oriented x 2. Normal mood.   Data Reviewed:  There are no new results to review at this time.  Family Communication: None at bedside  Disposition: Status is: Inpt  Planned Discharge Destination: Skilled nursing facility    Time spent: 39 minutes  Author: Deliliah Room, MD 03/21/2024 10:11 AM  For on call review [email or web address].

## 2024-03-21 NOTE — TOC Progression Note (Addendum)
 Transition of Care [facility]) - Progression Note    Patient Details  Name: John [name] MRN: [id number] Date of Birth: 05/16/1961  Transition of Care [facility]) CM/SW Contact  Isaiah Public, LCSWA Phone Number: 03/21/2024, 9:27 AM  Clinical Narrative:     CSW Lvm for Logan with [name]. CSW awaiting call back to check status on insurance authorization for patient. CSW will continue to follow.  Update- Logan with [facility] informed CSW that insurance auth for SNF currently pending.  Expected Discharge Plan: Skilled Nursing Facility Barriers to Discharge: Continued Medical Work up  Expected Discharge Plan and Services In-house Referral: Clinical Social Work     Living arrangements for the past 2 months:  (from [name] short term rehab)                                       Social Determinants of Health (SDOH) Interventions SDOH Screenings   Food Insecurity: No Food Insecurity (03/17/2024)  Housing: Low Risk  (03/17/2024)  Transportation Needs: No Transportation Needs (03/17/2024)  Utilities: Not At Risk (03/17/2024)  Tobacco Use: Medium Risk (03/17/2024)    Readmission Risk Interventions     No data to display

## 2024-03-21 NOTE — Plan of Care (Signed)

## 2024-03-22 ENCOUNTER — Encounter (HOSPITAL_BASED_OUTPATIENT_CLINIC_OR_DEPARTMENT_OTHER): Payer: Self-pay

## 2024-03-22 MED ORDER — CYANOCOBALAMIN 1000 MCG PO TABS: 1000.0000 ug | ORAL_TABLET | Freq: Every day | ORAL | 0 refills | Status: AC

## 2024-03-22 MED ORDER — ADULT MULTIVITAMIN W/MINERALS CH: 1.0000 | ORAL_TABLET | Freq: Every day | ORAL | 0 refills | Status: AC

## 2024-03-22 NOTE — Discharge Summary (Signed)
 Physician Discharge Summary   Patient: John [name] MRN: [id number] DOB: [birth year]  Admit date:     03/16/2024  Discharge date: 03/22/24  Discharge Physician: Deliliah Room   PCP: [name], MD   Recommendations at discharge:    Follow up with your PCP in one week. Return to ED if you suffer a fall, bleeding from any site, chest pain or shortness of breath.  Discharge Diagnoses: Principal Problem:   First time seizure [facility]) Active Problems:   AKI (acute kidney injury) (HCC)   Bilateral pulmonary embolism (HCC)   Normocytic anemia   Parkinson disease (HCC)   Lactic acidosis   Transient loss of consciousness   Hospital Course:  63 y.o. male with medical history significant for Parkinson disease, frequent falls with facial fractures, anemia, and bilateral pulmonary embolism who presents after an apparent seizure with fall. He was admitted to the hospital on 03/10/2024 with acute bilateral pulmonary emboli that appeared to be unprovoked, had echocardiogram with normal RV size and function, AKI and hyponatremia which resolved with IV fluid hydration, acute encephalopathy that resolved, and he was discharged to an SNF on Eliquis  on 7/1 and then shortly brought to ED after a witnessed fall as a level 1 trauma.   Mechanical fall,POA: Patient did not lose consciousness. PT/OT consulted F/u orthostatic vitals. Likely physical deconditioning Patient had an assisted fall on the morning of 7/3. He didn't hit his head. SNF on discharge   Concern for Seizure: ruled out. EEG is unremarkable. No need for initiation of AED as per neurology. No episodes of seizures in the hospital.   Pulmonary embolism,POA: -continue with eliquis    Physical deconditioning with multiple mechanical falls,POA: Continue with PT OT   AKI,POA: Improving. Received IVF. -Likely prerenal   Parkinson's disease,POA: Continue Sinement       Consultants: None Procedures performed: None  Disposition:  Skilled nursing facility Diet recommendation:  Cardiac diet DISCHARGE MEDICATION: Allergies as of 03/22/2024       Reactions   Tamsulosin  Hcl Anxiety, Other (See Comments)   Patient states it makes him extremelyjittery        Medication List     STOP taking these medications    losartan -hydrochlorothiazide 50-12.5 MG tablet Commonly known as: HYZAAR       TAKE these medications    carbidopa -levodopa  50-200 MG tablet Commonly known as: SINEMET  CR Take 1 tablet by mouth See admin instructions. Take one tablet by mouth six times daily using the following schedule:  (4-5am), 8am, 11am, 2pm, 5pm, and 8pm.   cyanocobalamin  1000 MCG tablet Take 1 tablet (1,000 mcg total) by mouth daily. Start taking on: March 23, 2024   Eliquis  5 MG Tabs tablet Generic drug: apixaban  Take 5 mg by mouth 2 (two) times daily. What changed: Another medication with the same name was removed. Continue taking this medication, and follow the directions you see here.   multivitamin with minerals Tabs tablet Take 1 tablet by mouth daily. Start taking on: March 23, 2024   omeprazole  20 MG capsule Commonly known as: PRILOSEC  Take 20 mg by mouth daily.        Follow-up Information     [name], MD. Call.   Specialty: Family Medicine Contact information: [address] [place] [address] [phone]                Discharge Exam: [name]   03/16/24 1821 03/17/24 1634  Weight: 74.8 kg 118.8 kg   Constitutional: Alert and  awake, bruising underneath right eye, right sided neck, right upper arm Eyes: PERRL, lids and conjunctivae normal ENMT: Mucous membranes are moist. Posterior pharynx clear of any exudate or lesions.Normal dentition.  Neck: normal, supple, no masses, no thyromegaly Respiratory: clear to auscultation bilaterally, no wheezing, no crackles. Normal respiratory effort. No accessory muscle use.  Cardiovascular: Regular rate and rhythm, no murmurs /  rubs / gallops. No extremity edema. 2+ pedal pulses. No carotid bruits.  Abdomen: no tenderness, no masses palpated. No hepatosplenomegaly. Bowel sounds positive.  Musculoskeletal: no clubbing / cyanosis. No joint deformity upper and lower extremities. Good ROM, no contractures. Normal muscle tone.  Skin: no rashes, lesions, ulcers. No induration Neurologic: CN 2-12 grossly intact. Sensation intact, DTR normal. Strength 5/5 x all 4 extremities.  Psychiatric: Alert and oriented x 2  Condition at discharge: good  The results of significant diagnostics from this hospitalization (including imaging, microbiology, ancillary and laboratory) are listed below for reference.   Imaging Studies: EEG adult Result Date: 03/17/2024 [name], MD     03/17/2024 10:35 AM Patient Name: John [name] MRN: [id number] Epilepsy Attending: Arlin KIDD Shelton Referring Physician/Provider: Ula Prentice SAUNDERS, MD Date: 03/17/2024 Duration: 23.10 mins Patient history: 63 year old male with seizure-like activity.  EEG to evaluate for seizure. Level of alertness: Awake, drowsy AEDs during EEG study: None Technical aspects: This EEG study was done with scalp electrodes positioned according to the 10-20 International system of electrode placement. Electrical activity was reviewed with band pass filter of 1-70Hz , sensitivity of 7 uV/mm, display speed of 68mm/sec with a 60Hz  notched filter applied as appropriate. EEG data were recorded continuously and digitally stored.  Video monitoring was available and reviewed as appropriate. Description: The posterior dominant rhythm consists of 8 Hz activity of moderate voltage (25-35 uV) seen predominantly in posterior head regions, symmetric and reactive to eye opening and eye closing. Drowsiness was characterized by attenuation of the posterior background rhythm. Physiologic photic driving was not seen during photic stimulation.  Hyperventilation was not performed.   IMPRESSION: This study is  within normal limits. No seizures or epileptiform discharges were seen throughout the recording. A normal interictal EEG does not exclude the diagnosis of epilepsy. Priyanka O Yadav \  CT Angio Chest PE W and/or Wo Contrast Result Date: 03/16/2024 CLINICAL DATA:  Pulmonary embolism (PE) suspected, high prob EXAM: CT ANGIOGRAPHY CHEST WITH CONTRAST TECHNIQUE: Multidetector CT imaging of the chest was performed using the standard protocol during bolus administration of intravenous contrast. Multiplanar CT image reconstructions and MIPs were obtained to evaluate the vascular anatomy. RADIATION DOSE REDUCTION: This exam was performed according to the departmental dose-optimization program which includes automated exposure control, adjustment of the mA and/or kV according to patient size and/or use of iterative reconstruction technique. CONTRAST:  55mL OMNIPAQUE  IOHEXOL  350 MG/ML SOLN COMPARISON:  Radiograph earlier today.  Chest CT 03/10/2024 FINDINGS: Cardiovascular: Known bilateral pulmonary emboli, greatest in the lower lobes. Stable or minimally improved clot burden over the last 6 days. No new or progressive pulmonary embolus. Unchanged RV to LV ratio of 1. Normal caliber thoracic aorta without acute aortic findings. Left vertebral artery arises directly from the thoracic aorta, variant anatomy. Unchanged pericardial effusion. Mediastinum/Nodes: No mediastinal or hilar adenopathy. Small hiatal hernia. No esophageal wall thickening. Lungs/Pleura: No pulmonary infarct. No focal airspace disease. Lung volumes are low. No pleural fluid. No pneumothorax. Upper Abdomen: Stable subcapsular hypodensity in the right lobe of the liver, previously characterized as cyst. No acute upper abdominal findings  Musculoskeletal: There are no acute or suspicious osseous abnormalities. Thoracic spondylosis. Remote left rib fractures. Review of the MIP images confirms the above findings. IMPRESSION: 1. Known bilateral pulmonary emboli,  greatest in the lower lobes. Stable or minimally improved clot burden over the last 6 days. No new or progressive pulmonary embolus. 2. Unchanged RV to LV ratio of 1. 3. Unchanged pericardial effusion. Electronically Signed   By: Andrea Gasman M.D.   On: 03/16/2024 20:15   DG Chest Port 1 View Result Date: 03/16/2024 CLINICAL DATA:  Status post seizure. EXAM: PORTABLE CHEST 1 VIEW COMPARISON:  October 02, 2021 FINDINGS: The heart size and mediastinal contours are within normal limits. Low lung volumes are noted. Both lungs are clear. Multilevel degenerative changes are seen throughout the thoracic spine. IMPRESSION: No active disease. Electronically Signed   By: Suzen Dials M.D.   On: 03/16/2024 19:13   DG Pelvis Portable Result Date: 03/16/2024 CLINICAL DATA:  Status post seizure. EXAM: PORTABLE PELVIS 1-2 VIEWS COMPARISON:  None Available. FINDINGS: There is no evidence of pelvic fracture or diastasis. No pelvic bone lesions are seen. Degenerative changes are seen within visualized portion of the lower lumbar spine. IMPRESSION: Negative. Electronically Signed   By: Suzen Dials M.D.   On: 03/16/2024 19:12   CT Head Wo Contrast Result Date: 03/16/2024 CLINICAL DATA:  Head trauma, neck trauma.  Level 1 trauma, seizures. EXAM: CT HEAD WITHOUT CONTRAST CT CERVICAL SPINE WITHOUT CONTRAST TECHNIQUE: Multidetector CT imaging of the head and cervical spine was performed following the standard protocol without intravenous contrast. Multiplanar CT image reconstructions of the cervical spine were also generated. RADIATION DOSE REDUCTION: This exam was performed according to the departmental dose-optimization program which includes automated exposure control, adjustment of the mA and/or kV according to patient size and/or use of iterative reconstruction technique. COMPARISON:  None Available. FINDINGS: CT HEAD FINDINGS Brain: No acute intracranial hemorrhage. No CT evidence of acute infarct. Nonspecific  hypoattenuation in the periventricular and subcortical white matter favored to reflect chronic microvascular ischemic changes. Mild parenchymal volume loss. There is a small arachnoid cyst in the left middle cranial fossa. No edema, mass effect, or midline shift. The basilar cisterns are patent. Ventricles: The ventricles are normal. Vascular: No hyperdense vessel or unexpected calcification. Skull: No acute or aggressive finding. Orbits: Orbits are symmetric. Sinuses: Mucosal thickening in the right maxillary sinus with hyperattenuating material which may reflect inspissated secretions versus fungal colonization. Mild mucosal thickening in the ethmoid sinuses. Other: Mastoid air cells are clear. There is a 1.9 x 0.9 cm hematoma in the right facial soft tissues overlying the zygomatic arch. Partially visualized nasal airway on the right. CT CERVICAL SPINE FINDINGS Alignment: Alignment is maintained. No listhesis. No facet subluxation or dislocation. Skull base and vertebrae: No acute fracture. No primary bone lesion or focal pathologic process. Prominent degenerative endplate osteophytes at multiple levels particularly at C3-4 and C4-5. Soft tissues and spinal canal: No prevertebral fluid or swelling. No visible canal hematoma. Disc levels: Intervertebral disc space narrowing at multiple levels. 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 Facet arthrosis and uncovertebral hypertrophy at multiple levels. Severe foraminal narrowing at C3-4. Upper chest: Negative. Other: None. IMPRESSION: No CT evidence of acute intracranial abnormality. No acute fracture or traumatic malalignment of the cervical spine. Hematoma in the right facial subcutaneous tissues overlying the zygomatic arch. Degenerative changes as above. Additional chronic changes as above. These results  were called by telephone at the time of interpretation on  03/16/2024 at 6:39 pm to provider Dr. Lyndel, who verbally acknowledged these results. Electronically Signed   By: Donnice Mania M.D.   On: 03/16/2024 18:39   CT Cervical Spine Wo Contrast Result Date: 03/16/2024 CLINICAL DATA:  Head trauma, neck trauma.  Level 1 trauma, seizures. EXAM: CT HEAD WITHOUT CONTRAST CT CERVICAL SPINE WITHOUT CONTRAST TECHNIQUE: Multidetector CT imaging of the head and cervical spine was performed following the standard protocol without intravenous contrast. Multiplanar CT image reconstructions of the cervical spine were also generated. RADIATION DOSE REDUCTION: This exam was performed according to the departmental dose-optimization program which includes automated exposure control, adjustment of the mA and/or kV according to patient size and/or use of iterative reconstruction technique. COMPARISON:  None Available. FINDINGS: CT HEAD FINDINGS Brain: No acute intracranial hemorrhage. No CT evidence of acute infarct. Nonspecific hypoattenuation in the periventricular and subcortical white matter favored to reflect chronic microvascular ischemic changes. Mild parenchymal volume loss. There is a small arachnoid cyst in the left middle cranial fossa. No edema, mass effect, or midline shift. The basilar cisterns are patent. Ventricles: The ventricles are normal. Vascular: No hyperdense vessel or unexpected calcification. Skull: No acute or aggressive finding. Orbits: Orbits are symmetric. Sinuses: Mucosal thickening in the right maxillary sinus with hyperattenuating material which may reflect inspissated secretions versus fungal colonization. Mild mucosal thickening in the ethmoid sinuses. Other: Mastoid air cells are clear. There is a 1.9 x 0.9 cm hematoma in the right facial soft tissues overlying the zygomatic arch. Partially visualized nasal airway on the right. CT CERVICAL SPINE FINDINGS Alignment: Alignment is maintained. No listhesis. No facet subluxation or dislocation. Skull base  and vertebrae: No acute fracture. No primary bone lesion or focal pathologic process. Prominent degenerative endplate osteophytes at multiple levels particularly at C3-4 and C4-5. Soft tissues and spinal canal: No prevertebral fluid or swelling. No visible canal hematoma. Disc levels: Intervertebral disc space narrowing at multiple levels. 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 Facet arthrosis and uncovertebral hypertrophy at multiple levels. Severe foraminal narrowing at C3-4. Upper chest: Negative. Other: None. IMPRESSION: No CT evidence of acute intracranial abnormality. No acute fracture or traumatic malalignment of the cervical spine. Hematoma in the right facial subcutaneous tissues overlying the zygomatic arch. Degenerative changes as above. Additional chronic changes as above. These results were called by telephone at the time of interpretation on 03/16/2024 at 6:39 pm to provider Dr. Lyndel, who verbally acknowledged these results. Electronically Signed   By: Donnice Mania M.D.   On: 03/16/2024 18:39    Microbiology: No results found for this or any previous visit.  Labs: CBC: Recent Labs  Lab 03/16/24 1930 03/16/24 1939 03/17/24 0523 03/18/24 0420  WBC 11.6*  --  6.4 7.4  NEUTROABS  --   --   --  4.6  HGB 8.9* 8.8* 8.8* 8.6*  HCT 28.2* 26.0* 27.3* 26.9*  MCV 85.5  --  85.8 86.2  PLT 271  --  292 278   Basic Metabolic Panel: Recent Labs  Lab 03/16/24 1930 03/16/24 1939 03/17/24 0523 03/18/24 0420  NA 134* 136 135 136  K 3.8 3.5 3.5 3.8  CL 103 106 106 106  CO2 16*  --  20* 20*  GLUCOSE 100* 100* 96 96  BUN 14 14 11 14   CREATININE 1.48* 1.30* 1.22 1.16  CALCIUM 8.5*  --  8.3* 8.8*   Liver Function Tests: Recent Labs  Lab 03/16/24 1930  AST 20  ALT 9  ALKPHOS 82  BILITOT 1.1  PROT 5.6*  ALBUMIN 3.0*   CBG: No results for input(s): GLUCAP in the last 168  hours.  Discharge time spent: 43 minutes.  Signed: Deliliah Room, MD [facility] 03/22/2024

## 2024-03-22 NOTE — TOC Transition Note (Signed)
 Transition of Care [facility]) - Discharge Note   Patient Details  Name: John [name] MRN: [id number] Date of Birth: [birth year]  Transition of Care [facility] CM/SW Contact:  John CHRISTELLA Goodie, LCSW Phone Number: 03/22/2024, 11:52 AM   Clinical Narrative:   Patient received insurance approval to admit to [name], and [name] can admit today. CSW updated MD, patient is stable for discharge. CSW spoke with sister, John [name], to discuss return to SNF. Lengthy conversation with sister about concerns regarding patient's care needs and plans for the future, including whether patient can drive, return home alone, or need long term placement; as well as practical matters on how to make sure his rent is paid, as patient is not able to manage all of that on his own at this time. CSW answered questions and provided validation for sister's concerns, sister was appreciative. Transport arranged with [facility] for next available.  Nurse to call report to [phone].    Final next level of care: Skilled Nursing Facility Barriers to Discharge: Barriers Resolved   Patient Goals and CMS Choice     Choice offered to / list presented to : Sibling (sister)      Discharge Placement              Patient chooses bed at: [facility] Patient to be transferred to facility by: [facility] Name of family member notified: John [name] Patient and family notified of of transfer: 03/22/24  Discharge Plan and Services Additional resources added to the After Visit Summary for   In-house Referral: Clinical Social Work                                   Social Drivers of Health (SDOH) Interventions SDOH Screenings   Food Insecurity: No Food Insecurity (03/17/2024)  Housing: Low Risk  (03/17/2024)  Transportation Needs: No Transportation Needs (03/17/2024)  Utilities: Not At Risk (03/17/2024)  Tobacco Use: Medium Risk (03/17/2024)     Readmission Risk Interventions     No data to display

## 2024-03-22 NOTE — NC FL2 (Signed)
 [place]  MEDICAID FL2 LEVEL OF CARE FORM     IDENTIFICATION  Patient Name: John [name] Birthdate: [birth year] Sex: male Admission Date (Current Location): 03/16/2024  [facility] and [place] Number:  Producer, television/film/video and Address:  [facility] [place], [address] [place], [place] [address]      Provider Number: [id number]  Attending Physician Name and Address:  Dino Antu, MD  Relative Name and Phone Number:  John [name] (sister) [phone]    Current Level of Care: Hospital Recommended Level of Care: Skilled Nursing Facility Prior Approval Number:    Date Approved/Denied:   PASRR Number: [id number]  Discharge Plan: SNF    Current Diagnoses: Patient Active Problem List   Diagnosis Date Noted   Transient loss of consciousness 03/17/2024   First time seizure (HCC) 03/16/2024   AKI (acute kidney injury) (HCC) 03/16/2024   Bilateral pulmonary embolism (HCC) 03/16/2024   Normocytic anemia 03/16/2024   Parkinson disease (HCC) 03/16/2024   Lactic acidosis 03/16/2024    Orientation RESPIRATION BLADDER Height & Weight     Self, Place  Normal Incontinent, External catheter (External Urinary Catheter) Weight: 261 lb 14.5 oz (118.8 kg) Height:  6' 2 (188 cm)  BEHAVIORAL SYMPTOMS/MOOD NEUROLOGICAL BOWEL NUTRITION STATUS      Continent Diet (Please see discharge summary)  AMBULATORY STATUS COMMUNICATION OF NEEDS Skin   Extensive Assist Verbally Other (Comment) (Wound/Incision LDAs,Wound PI heel,L,stage 3,Abrasion,arm,leg,elbow,Bil.,L,Ecchymosis,abdomen,arm,Bil.,Erythema,Buttocks,scrotum,Bil.)                       Personal Care Assistance Level of Assistance  Feeding, Bathing, Dressing Bathing Assistance: Maximum assistance Feeding assistance: Independent Dressing Assistance: Maximum assistance     Functional Limitations Info  Sight, Hearing, Speech   Hearing Info: Adequate Speech Info: Adequate    SPECIAL CARE FACTORS  FREQUENCY  PT (By licensed PT), OT (By licensed OT)     PT Frequency: 5x min weekly OT Frequency: 5x min weekly            Contractures Contractures Info: Not present    Additional Factors Info  Code Status, Allergies Code Status Info: FULL Allergies Info: Tamsulosin  Hcl           Current Medications (03/22/2024):  This is the current hospital active medication list Current Facility-Administered Medications  Medication Dose Route Frequency Provider Last Rate Last Admin   acetaminophen  (TYLENOL ) tablet 650 mg  650 mg Oral Q6H PRN Opyd, Timothy S, MD       Or   acetaminophen  (TYLENOL ) suppository 650 mg  650 mg Rectal Q6H PRN Opyd, Timothy S, MD       apixaban  (ELIQUIS ) tablet 5 mg  5 mg Oral BID Rashid, Farhan, MD   5 mg at 03/22/24 9093   carbidopa -levodopa  (SINEMET  CR) 50-200 MG per tablet controlled release 1 tablet  1 tablet Oral 6 X Daily Opyd, Timothy S, MD   1 tablet at 03/22/24 9093   cyanocobalamin  (VITAMIN B12) tablet 1,000 mcg  1,000 mcg Oral Daily Opyd, Timothy S, MD   1,000 mcg at 03/22/24 9093   multivitamin with minerals tablet 1 tablet  1 tablet Oral Daily Opyd, Timothy S, MD   1 tablet at 03/22/24 9093   oxyCODONE  (Oxy IR/ROXICODONE ) immediate release tablet 5-10 mg  5-10 mg Oral Q4H PRN Opyd, Timothy S, MD   10 mg at 03/18/24 2058   pantoprazole  (PROTONIX ) EC tablet 40 mg  40 mg Oral Daily Opyd, Timothy S, MD  40 mg at 03/22/24 0906   polyethylene glycol (MIRALAX  / GLYCOLAX ) packet 17 g  17 g Oral Daily PRN Opyd, Timothy S, MD       prochlorperazine  (COMPAZINE ) injection 5 mg  5 mg Intravenous Q6H PRN Opyd, Timothy S, MD       sodium chloride  flush (NS) 0.9 % injection 3 mL  3 mL Intravenous Q12H Opyd, Timothy S, MD   3 mL at 03/21/24 2123     Discharge Medications: Please see discharge summary for a list of discharge medications.  Relevant Imaging Results:  Relevant Lab Results:   Additional Information [id number]  John [name],  [place]

## 2024-03-22 NOTE — Plan of Care (Signed)

## 2024-03-23 ENCOUNTER — Emergency Department (HOSPITAL_COMMUNITY)

## 2024-03-23 ENCOUNTER — Other Ambulatory Visit: Payer: Self-pay

## 2024-03-23 ENCOUNTER — Emergency Department (HOSPITAL_COMMUNITY)
Admission: EM | Admit: 2024-03-23 | Discharge: 2024-03-23 | Disposition: A | Discharge status: Home / Self Care | Source: Home / Self Care | Attending: Emergency Medicine | Admitting: Emergency Medicine

## 2024-03-23 DIAGNOSIS — R339 Retention of urine, unspecified: Secondary | ICD-10-CM | POA: Insufficient documentation

## 2024-03-23 DIAGNOSIS — I10 Essential (primary) hypertension: Secondary | ICD-10-CM | POA: Insufficient documentation

## 2024-03-23 DIAGNOSIS — S01112A Laceration without foreign body of left eyelid and periocular area, initial encounter: Secondary | ICD-10-CM | POA: Insufficient documentation

## 2024-03-23 DIAGNOSIS — G20A1 Parkinson's disease without dyskinesia, without mention of fluctuations: Secondary | ICD-10-CM | POA: Insufficient documentation

## 2024-03-23 DIAGNOSIS — S0181XA Laceration without foreign body of other part of head, initial encounter: Secondary | ICD-10-CM | POA: Insufficient documentation

## 2024-03-23 DIAGNOSIS — Z7901 Long term (current) use of anticoagulants: Secondary | ICD-10-CM | POA: Insufficient documentation

## 2024-03-23 DIAGNOSIS — W1809XA Striking against other object with subsequent fall, initial encounter: Secondary | ICD-10-CM | POA: Insufficient documentation

## 2024-03-23 DIAGNOSIS — W19XXXA Unspecified fall, initial encounter: Secondary | ICD-10-CM

## 2024-03-23 DIAGNOSIS — N3090 Cystitis, unspecified without hematuria: Secondary | ICD-10-CM | POA: Insufficient documentation

## 2024-03-23 LAB — I-STAT CHEM 8, ED
BUN: 17 mg/dL (ref 8–23)
Calcium, Ion: 1.12 mmol/L — ABNORMAL LOW (ref 1.15–1.40)
Chloride: 108 mmol/L (ref 98–111)
Creatinine, Ser: 1.2 mg/dL (ref 0.61–1.24)
Glucose, Bld: 118 mg/dL — ABNORMAL HIGH (ref 70–99)
HCT: 28 % — ABNORMAL LOW (ref 39.0–52.0)
Hemoglobin: 9.5 g/dL — ABNORMAL LOW (ref 13.0–17.0)
Potassium: 3.3 mmol/L — ABNORMAL LOW (ref 3.5–5.1)
Sodium: 140 mmol/L (ref 135–145)
TCO2: 20 mmol/L — ABNORMAL LOW (ref 22–32)

## 2024-03-23 LAB — URINALYSIS, ROUTINE W REFLEX MICROSCOPIC
Bilirubin Urine: NEGATIVE
Glucose, UA: NEGATIVE mg/dL
Ketones, ur: 5 mg/dL — AB
Leukocytes,Ua: NEGATIVE
Nitrite: NEGATIVE
Protein, ur: NEGATIVE mg/dL
Specific Gravity, Urine: 1.016 (ref 1.005–1.030)
pH: 6 (ref 5.0–8.0)

## 2024-03-23 LAB — PROTIME-INR
INR: 1.3 — ABNORMAL HIGH (ref 0.8–1.2)
Prothrombin Time: 16.9 s — ABNORMAL HIGH (ref 11.4–15.2)

## 2024-03-23 LAB — COMPREHENSIVE METABOLIC PANEL WITH GFR
ALT: 6 U/L (ref 0–44)
AST: 19 U/L (ref 15–41)
Albumin: 3.1 g/dL — ABNORMAL LOW (ref 3.5–5.0)
Alkaline Phosphatase: 88 U/L (ref 38–126)
Anion gap: 11 (ref 5–15)
BUN: 19 mg/dL (ref 8–23)
CO2: 17 mmol/L — ABNORMAL LOW (ref 22–32)
Calcium: 8.8 mg/dL — ABNORMAL LOW (ref 8.9–10.3)
Chloride: 109 mmol/L (ref 98–111)
Creatinine, Ser: 1.25 mg/dL — ABNORMAL HIGH (ref 0.61–1.24)
GFR, Estimated: >60 mL/min (ref >60)
Glucose, Bld: 116 mg/dL — ABNORMAL HIGH (ref 70–99)
Potassium: 3.5 mmol/L (ref 3.5–5.1)
Sodium: 137 mmol/L (ref 135–145)
Total Bilirubin: 0.8 mg/dL (ref 0.0–1.2)
Total Protein: 5.9 g/dL — ABNORMAL LOW (ref 6.5–8.1)

## 2024-03-23 LAB — CBC
HCT: 30.2 % — ABNORMAL LOW (ref 39.0–52.0)
Hemoglobin: 9.4 g/dL — ABNORMAL LOW (ref 13.0–17.0)
MCH: 27.6 pg (ref 26.0–34.0)
MCHC: 31.1 g/dL (ref 30.0–36.0)
MCV: 88.8 fL (ref 80.0–100.0)
Platelets: 318 K/uL (ref 150–400)
RBC: 3.4 MIL/uL — ABNORMAL LOW (ref 4.22–5.81)
RDW: 15.1 % (ref 11.5–15.5)
WBC: 8.8 K/uL (ref 4.0–10.5)
nRBC: 0 % (ref 0.0–0.2)

## 2024-03-23 LAB — MAGNESIUM: Magnesium: 1.6 mg/dL — ABNORMAL LOW (ref 1.7–2.4)

## 2024-03-23 MED ORDER — POTASSIUM CHLORIDE CRYS ER 20 MEQ PO TBCR
40.0000 meq | EXTENDED_RELEASE_TABLET | Freq: Once | ORAL | Status: AC
  Administered 2024-03-23: 40 meq via ORAL
  Filled 2024-03-23: qty 2

## 2024-03-23 MED ORDER — CARBIDOPA-LEVODOPA ER 50-200 MG PO TBCR
1.0000 | EXTENDED_RELEASE_TABLET | Freq: Once | ORAL | Status: DC
  Filled 2024-03-23: qty 1

## 2024-03-23 MED ORDER — MAGNESIUM OXIDE -MG SUPPLEMENT 400 (240 MG) MG PO TABS
800.0000 mg | ORAL_TABLET | Freq: Once | ORAL | Status: AC
  Administered 2024-03-23: 800 mg via ORAL
  Filled 2024-03-23: qty 2

## 2024-03-23 MED ORDER — LIDOCAINE-EPINEPHRINE (PF) 2 %-1:200000 IJ SOLN
10.0000 mL | Freq: Once | INTRAMUSCULAR | Status: AC
  Administered 2024-03-23: 10 mL via INTRADERMAL
  Filled 2024-03-23: qty 20

## 2024-03-23 MED ORDER — FENTANYL CITRATE PF 50 MCG/ML IJ SOSY
50.0000 ug | PREFILLED_SYRINGE | Freq: Once | INTRAMUSCULAR | Status: AC
  Administered 2024-03-23: 50 ug via INTRAVENOUS
  Filled 2024-03-23: qty 1

## 2024-03-23 MED ORDER — CEPHALEXIN 500 MG PO CAPS: 500.0000 mg | ORAL_CAPSULE | Freq: Three times a day (TID) | ORAL | 0 refills | Status: DC

## 2024-03-23 MED ORDER — SODIUM CHLORIDE 0.9 % IV SOLN
1.0000 g | Freq: Once | INTRAVENOUS | Status: AC
  Administered 2024-03-23: 1 g via INTRAVENOUS
  Filled 2024-03-23: qty 10

## 2024-03-23 MED ORDER — LIDOCAINE-EPINEPHRINE-TETRACAINE (LET) TOPICAL GEL
3.0000 mL | Freq: Once | TOPICAL | Status: AC
  Administered 2024-03-23: 3 mL via TOPICAL
  Filled 2024-03-23: qty 3

## 2024-03-23 NOTE — Discharge Instructions (Addendum)
 Your imaging studies did not show any major injuries.  Sutures on your left forehead will need to be removed in 7 to 10 days.  A Foley catheter was placed for urine retention.  This should remain in place until you follow-up with the urologist.  Call the telephone number below to set up that follow-up appointment.  Your urine today did show evidence of infection.  You were given antibiotics while in the emergency department.  A prescription for continued antibiotics was sent to your pharmacy.  Take as prescribed.  Return to emergency department for any new or worsening symptoms of concern.

## 2024-03-23 NOTE — ED Provider Notes (Signed)
 [place] EMERGENCY DEPARTMENT AT [facility] Provider Note   CSN: [id number] Arrival date & time: 03/23/24  1435     Patient presents with: Fall  *This is a procedure only note. I performed a laceration repair of this patient's left eyebrow, the rest of medical assessment and treatment was performed by my attending Dr. Bernardino Fireman. *   John [name] is a 63 y.o. male.    Fall       Prior to Admission medications   Medication Sig Start Date End Date Taking? Authorizing Provider  cephALEXin  (KEFLEX ) 500 MG capsule Take 1 capsule (500 mg total) by mouth 3 (three) times daily for 5 days. 03/23/24 03/28/24 Yes Fireman Bernardino, MD  acetaminophen  (TYLENOL ) 500 MG tablet Take 1 tablet (500 mg total) by mouth every 6 (six) hours as needed for mild pain (pain score 1-3), moderate pain (pain score 4-6) or headache. 03/16/24   Hongalgi, Anand D, MD  apixaban  (ELIQUIS ) 5 MG TABS tablet Take 2 tabs (10 mg total) twice a day through 03/18/2024.  From 03/19/2024, start taking 1 tab (5 Mg total) twice daily. 03/16/24   Hongalgi, Anand D, MD  apixaban  (ELIQUIS ) 5 MG TABS tablet Take 5 mg by mouth 2 (two) times daily. Patient not taking: Reported on 03/16/2024 03/19/24   [provider]  carbidopa -levodopa  (SINEMET  CR) 50-200 MG tablet Take 1 tablet by mouth 6 (six) times daily. (4-5 AM, 8 AM, 11 AM, 2 PM, 5 PM, 8 PM). 12/08/23   Skeet, Adam R, DO  carbidopa -levodopa  (SINEMET  CR) 50-200 MG tablet Take 1 tablet by mouth See admin instructions. Take one tablet by mouth six times daily using the following schedule:  (4-5am), 8am, 11am, 2pm, 5pm, and 8pm. 02/18/24   [provider]  cyanocobalamin  1000 MCG tablet Take 1 tablet (1,000 mcg total) by mouth daily. 03/23/24   Rashid, Farhan, MD  Multiple Vitamin (MULTIVITAMIN WITH MINERALS) TABS tablet Take 1 tablet by mouth daily. 03/23/24   Dino Antu, MD  omeprazole  (PRILOSEC ) 20 MG capsule Take 1 capsule (20 mg total) by mouth daily. 12/10/23    [name], MD  omeprazole  (PRILOSEC ) 20 MG capsule Take 20 mg by mouth daily. 12/10/23   [provider]    Allergies: Tamsulosin  hcl    Review of Systems  Updated Vital Signs BP 134/76   Pulse (!) 105   Temp 97.8 F (36.6 C) (Oral)   Resp 20   Ht 6' 2 (1.88 m)   Wt 118.2 kg   SpO2 100%   BMI 33.46 kg/m   Physical Exam  (all labs ordered are listed, but only abnormal results are displayed) Labs Reviewed  COMPREHENSIVE METABOLIC PANEL WITH GFR - Abnormal; Notable for the following components:      Result Value   CO2 17 (*)    Glucose, Bld 116 (*)    Creatinine, Ser 1.25 (*)    Calcium 8.8 (*)    Total Protein 5.9 (*)    Albumin 3.1 (*)    All other components within normal limits  CBC - Abnormal; Notable for the following components:   RBC 3.40 (*)    Hemoglobin 9.4 (*)    HCT 30.2 (*)    All other components within normal limits  URINALYSIS, ROUTINE W REFLEX MICROSCOPIC - Abnormal; Notable for the following components:   Color, Urine AMBER (*)    APPearance HAZY (*)    Hgb urine dipstick SMALL (*)    Ketones, ur  5 (*)    Bacteria, UA MANY (*)    All other components within normal limits  PROTIME-INR - Abnormal; Notable for the following components:   Prothrombin Time 16.9 (*)    INR 1.3 (*)    All other components within normal limits  MAGNESIUM  - Abnormal; Notable for the following components:   Magnesium  1.6 (*)    All other components within normal limits  I-STAT CHEM 8, ED - Abnormal; Notable for the following components:   Potassium 3.3 (*)    Glucose, Bld 118 (*)    Calcium, Ion 1.12 (*)    TCO2 20 (*)    Hemoglobin 9.5 (*)    HCT 28.0 (*)    All other components within normal limits  URINE CULTURE    EKG: EKG Interpretation Date/Time:  Tuesday March 23 2024 15:09:46 EDT Ventricular Rate:  100 PR Interval:  167 QRS Duration:  71 QT Interval:  390 QTC Calculation: 503 R Axis:   24  Text Interpretation: Sinus tachycardia Low  voltage, precordial leads Borderline repolarization abnormality Prolonged QT interval Confirmed by Melvenia Motto [phone]) on 03/23/2024 3:28:42 PM  Radiology: CT Cervical Spine Wo Contrast Result Date: 03/23/2024 CLINICAL DATA:  Trauma fall EXAM: CT CERVICAL SPINE WITHOUT CONTRAST TECHNIQUE: Multidetector CT imaging of the cervical spine was performed without intravenous contrast. Multiplanar CT image reconstructions were also generated. RADIATION DOSE REDUCTION: This exam was performed according to the departmental dose-optimization program which includes automated exposure control, adjustment of the mA and/or kV according to patient size and/or use of iterative reconstruction technique. COMPARISON:  CT 03/10/2024, 03/16/2024 FINDINGS: Alignment: Straightening of the cervical spine. No subluxation. Facet alignment is normal Skull base and vertebrae: No acute fracture. No primary bone lesion or focal pathologic process. Soft tissues and spinal canal: No prevertebral fluid or swelling. No visible canal hematoma. Disc levels: Diffuse degenerative changes with diffuse disc space narrowing, moderate severe narrowing at C5-C6, C6-C7 and C7-T1. 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 Multilevel facet degenerative changes. Severe foraminal narrowing at C3-C4. Bulky anterior osteophytes. Upper chest: Negative. Other: None IMPRESSION: 1. Straightening of the cervical spine with advanced degenerative changes. No acute osseous abnormality. 2. 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 Electronically Signed   By: Luke Bun M.D.   On: 03/23/2024 16:45   CT MAXILLOFACIAL WO CONTRAST Result Date: 03/23/2024 CLINICAL DATA:  Unwitnessed fall EXAM: CT MAXILLOFACIAL WITHOUT CONTRAST TECHNIQUE: Multidetector CT imaging of the maxillofacial structures was performed. Multiplanar CT image reconstructions were  also generated. RADIATION DOSE REDUCTION: This exam was performed according to the departmental dose-optimization program which includes automated exposure control, adjustment of the mA and/or kV according to patient size and/or use of iterative reconstruction technique. COMPARISON:  Facial CT 03/10/2024 FINDINGS: Osseous: Mastoid air cells are clear. Mandibular heads are normally positioned. Heterogeneous sclerosis in the right mastoid. Pterygoid plates and zygomatic arches are intact. No acute nasal bone fracture Orbits: Redemonstrated nondisplaced fractures of the medial right orbital wall and inferior right orbital rim. Globes appear intact. No intraorbital soft tissue swelling. Chronic fracture deformity posterior floor of right orbit. Sinuses: Interval opacification of right maxillary sinus. Redemonstrated fractures of the anterior and posterolateral walls of the right maxillary sinus. Soft tissues: Residual right cheek hematoma. Deep laceration left forehead with small right forehead hematoma Limited intracranial: See separately dictated head CT IMPRESSION: 1. Redemonstrated fractures involving the right maxillary sinus  and orbit, stable since 03/10/2024. No new fracture abnormality is seen. 2. Residual right cheek hematoma. Deep laceration left forehead with small right forehead scalp hematoma Electronically Signed   By: Luke Bun M.D.   On: 03/23/2024 16:37   DG Pelvis 1-2 Views Result Date: 03/23/2024 CLINICAL DATA:  Trauma fall EXAM: PELVIS - 1-2 VIEW COMPARISON:  03/16/2024 FINDINGS: SI joints are non widened. Pubic symphysis and rami appear intact. Limited assessment of femoral necks due to positioning and superimposition of trochanters. IMPRESSION: Limited assessment of femoral necks due to positioning. If there is high clinical concern for hip fracture, dedicated hip radiographs versus CT follow-up. Otherwise no acute osseous abnormality is seen Electronically Signed   By: Luke Bun M.D.    On: 03/23/2024 16:28   DG Chest 1 View Result Date: 03/23/2024 CLINICAL DATA:  Fall trauma EXAM: CHEST  1 VIEW COMPARISON:  03/16/2024 FINDINGS: Hypoventilatory changes. No acute airspace disease or effusion. Stable cardiomediastinal silhouette. No pneumothorax IMPRESSION: No active disease. Hypoventilatory changes. Electronically Signed   By: Luke Bun M.D.   On: 03/23/2024 16:27   DG Tibia/Fibula Left Result Date: 03/23/2024 CLINICAL DATA:  Fall EXAM: LEFT TIBIA AND FIBULA - 2 VIEW COMPARISON:  CT knee 02/16/2024 FINDINGS: No fracture or malalignment. Heterotopic ossification surrounding the proximal fibula with ankylosis of the proximal tibiofibular joint as seen on prior CT. Soft tissues are unremarkable IMPRESSION: No acute osseous abnormality. Electronically Signed   By: Luke Bun M.D.   On: 03/23/2024 16:26   CT Head Wo Contrast Result Date: 03/23/2024 CLINICAL DATA:  Unwitnessed fall laceration to left eyebrow trauma EXAM: CT HEAD WITHOUT CONTRAST TECHNIQUE: Contiguous axial images were obtained from the base of the skull through the vertex without intravenous contrast. RADIATION DOSE REDUCTION: This exam was performed according to the departmental dose-optimization program which includes automated exposure control, adjustment of the mA and/or kV according to patient size and/or use of iterative reconstruction technique. COMPARISON:  CT brain 03/10/2024, 03/16/2024 FINDINGS: Brain: No acute territorial infarction, hemorrhage or intracranial mass. Moderate atrophy. Mild chronic small vessel ischemic changes of the white matter. Stable ventricle size. Vascular: No hyperdense vessels.  Carotid vascular calcification. Skull: No depressed skull fracture. Sinuses/Orbits: See separately dictated facial CT. Other: Laceration and hematoma to the left greater than right forehead. IMPRESSION: 1. No definite CT evidence for acute intracranial abnormality. 2. Atrophy and chronic small vessel ischemic  changes of the white matter. 3. Laceration and hematoma to the left greater than right forehead. Electronically Signed   By: Luke Bun M.D.   On: 03/23/2024 16:24     .Laceration Repair  Date/Time: 03/23/2024 7:54 PM  Performed by: Janetta Terrall FALCON, PA-C Authorized by: Janetta Terrall FALCON, PA-C   Consent:    Consent obtained:  Verbal   Consent given by:  Patient   Risks, benefits, and alternatives were discussed: yes     Risks discussed:  Infection, pain, retained foreign body, need for additional repair, poor cosmetic result and poor wound healing   Alternatives discussed:  No treatment and observation Laceration details:    Location:  Face   Face location:  L eyebrow   Length (cm):  16 Exploration:    Limited defect created (wound extended): yes   Treatment:    Area cleansed with:  Povidone-iodine and saline   Amount of cleaning:  Standard   Visualized foreign bodies/material removed: no   Skin repair:    Repair method:  Sutures   Suture size:  4-0   Suture material:  Nylon   Suture technique:  Simple interrupted   Number of sutures:  10 Approximation:    Approximation:  Close Repair type:    Repair type:  Intermediate Post-procedure details:    Dressing:  Open (no dressing)   Procedure completion:  Tolerated well, no immediate complications     Medications Ordered in the ED  cefTRIAXone  (ROCEPHIN ) 1 g in sodium chloride  0.9 % 100 mL IVPB (has no administration in time range)  fentaNYL  (SUBLIMAZE ) injection 50 mcg (50 mcg Intravenous Given 03/23/24 1522)  lidocaine -EPINEPHrine -tetracaine  (LET) topical gel (3 mLs Topical Given 03/23/24 1523)  magnesium  oxide (MAG-OX) tablet 800 mg (800 mg Oral Given 03/23/24 1731)  lidocaine -EPINEPHrine  (XYLOCAINE  W/EPI) 2 %-1:200000 (PF) injection 10 mL (10 mLs Intradermal Given 03/23/24 1834)    Clinical Course as of 03/23/24 1958  Tue Mar 23, 2024  1949 10 simple interrupted sutures placed by myself 4-0 ethilon  [CH]    Clinical  Course User Index [CH] Magdiel Bartles, Terrall FALCON, PA-C                                 Medical Decision Making Amount and/or Complexity of Data Reviewed Labs: ordered. Radiology: ordered.  Risk OTC drugs. Prescription drug management.   *This is a procedure only note. I performed a laceration repair of this patient's left eyebrow, the rest of medical assessment and treatment was performed by my attending Dr. Bernardino Fireman. *      Final diagnoses:  None    ED Discharge Orders          Ordered    cephALEXin  (KEFLEX ) 500 MG capsule  3 times daily        03/23/24 1918               Mikaia Janvier F, PA-C 03/23/24 1958    Fireman Bernardino, MD 03/24/24 [phone]

## 2024-03-23 NOTE — ED Notes (Signed)
 Please give sister (karen) a call back for an update

## 2024-03-23 NOTE — ED Provider Notes (Signed)
 [place] [facility] AT [facility] Provider Note   CSN: [id number] Arrival date & time: 03/23/24  1435     Patient presents with: John [name] is a 63 y.o. male.    Fall Associated symptoms include headaches.  Patient presents after fall.  Medical history includes Parkinson's disease, HTN, CVA, PE, anemia.  He is on Eliquis .  Patient describes a mechanical fall that occurred shortly prior to arrival.  He states that he stood up and stumbled backwards.  This caused him to lose his balance and fall.  As he fell, he did twisting landed on his left side.  He did strike his head.  He sustained a laceration to left forehead area.  Any other areas of discomfort.    Prior to Admission medications   Medication Sig Start Date End Date Taking? Authorizing Provider  cephALEXin  (KEFLEX ) 500 MG capsule Take 1 capsule (500 mg total) by mouth 3 (three) times daily for 5 days. 03/23/24 03/28/24 Yes Melvenia Motto, MD  acetaminophen  (TYLENOL ) 500 MG tablet Take 1 tablet (500 mg total) by mouth every 6 (six) hours as needed for mild pain (pain score 1-3), moderate pain (pain score 4-6) or headache. 03/16/24   Hongalgi, Anand D, MD  apixaban  (ELIQUIS ) 5 MG TABS tablet Take 2 tabs (10 mg total) twice a day through 03/18/2024.  From 03/19/2024, start taking 1 tab (5 Mg total) twice daily. 03/16/24   Hongalgi, Anand D, MD  apixaban  (ELIQUIS ) 5 MG TABS tablet Take 5 mg by mouth 2 (two) times daily. Patient not taking: Reported on 03/16/2024 03/19/24   [provider]  carbidopa -levodopa  (SINEMET  CR) 50-200 MG tablet Take 1 tablet by mouth 6 (six) times daily. (4-5 AM, 8 AM, 11 AM, 2 PM, 5 PM, 8 PM). 12/08/23   Skeet, Adam R, DO  carbidopa -levodopa  (SINEMET  CR) 50-200 MG tablet Take 1 tablet by mouth See admin instructions. Take one tablet by mouth six times daily using the following schedule:  (4-5am), 8am, 11am, 2pm, 5pm, and 8pm. 02/18/24   [provider]  cyanocobalamin  1000 MCG  tablet Take 1 tablet (1,000 mcg total) by mouth daily. 03/23/24   Rashid, Farhan, MD  Multiple Vitamin (MULTIVITAMIN WITH MINERALS) TABS tablet Take 1 tablet by mouth daily. 03/23/24   Rashid, Farhan, MD  omeprazole  (PRILOSEC ) 20 MG capsule Take 1 capsule (20 mg total) by mouth daily. 12/10/23   [name], MD  omeprazole  (PRILOSEC ) 20 MG capsule Take 20 mg by mouth daily. 12/10/23   [provider]    Allergies: Tamsulosin  hcl    Review of Systems  Skin:  Positive for wound.  Neurological:  Positive for headaches.  All other systems reviewed and are negative.   Updated Vital Signs BP 129/88   Pulse (!) 103   Temp 97.8 F (36.6 C) (Oral)   Resp 20   Ht 6' 2 (1.88 m)   Wt 118.2 kg   SpO2 100%   BMI 33.46 kg/m   Physical Exam Vitals and nursing note reviewed.  Constitutional:      General: He is not in acute distress.    Appearance: Normal appearance. He is well-developed. He is not ill-appearing, toxic-appearing or diaphoretic.  HENT:     Head: Normocephalic.     Comments: Large laceration to left forehead/eyebrow area.  Hemostatic at this time.    Right Ear: External ear normal.     Left Ear: External ear normal.  Nose: Nose normal.     Mouth/Throat:     Mouth: Mucous membranes are moist.  Eyes:     Extraocular Movements: Extraocular movements intact.     Conjunctiva/sclera: Conjunctivae normal.  Cardiovascular:     Rate and Rhythm: Normal rate and regular rhythm.  Pulmonary:     Effort: Pulmonary effort is normal. No respiratory distress.     Breath sounds: Normal breath sounds. No wheezing or rales.  Chest:     Chest wall: No tenderness.  Abdominal:     General: There is no distension.     Palpations: Abdomen is soft.     Tenderness: There is no abdominal tenderness.  Musculoskeletal:        General: No swelling or deformity.     Cervical back: Neck supple.     Right lower leg: No edema.     Left lower leg: No edema.  Skin:    General: Skin is  warm and dry.     Coloration: Skin is not jaundiced or pale.  Neurological:     General: No focal deficit present.     Mental Status: He is alert and oriented to person, place, and time.  Psychiatric:        Mood and Affect: Mood normal.        Behavior: Behavior normal.     (all labs ordered are listed, but only abnormal results are displayed) Labs Reviewed  COMPREHENSIVE METABOLIC PANEL WITH GFR - Abnormal; Notable for the following components:      Result Value   CO2 17 (*)    Glucose, Bld 116 (*)    Creatinine, Ser 1.25 (*)    Calcium 8.8 (*)    Total Protein 5.9 (*)    Albumin 3.1 (*)    All other components within normal limits  CBC - Abnormal; Notable for the following components:   RBC 3.40 (*)    Hemoglobin 9.4 (*)    HCT 30.2 (*)    All other components within normal limits  URINALYSIS, ROUTINE W REFLEX MICROSCOPIC - Abnormal; Notable for the following components:   Color, Urine AMBER (*)    APPearance HAZY (*)    Hgb urine dipstick SMALL (*)    Ketones, ur 5 (*)    Bacteria, UA MANY (*)    All other components within normal limits  PROTIME-INR - Abnormal; Notable for the following components:   Prothrombin Time 16.9 (*)    INR 1.3 (*)    All other components within normal limits  MAGNESIUM  - Abnormal; Notable for the following components:   Magnesium  1.6 (*)    All other components within normal limits  I-STAT CHEM 8, ED - Abnormal; Notable for the following components:   Potassium 3.3 (*)    Glucose, Bld 118 (*)    Calcium, Ion 1.12 (*)    TCO2 20 (*)    Hemoglobin 9.5 (*)    HCT 28.0 (*)    All other components within normal limits  URINE CULTURE    EKG: EKG Interpretation Date/Time:  Tuesday March 23 2024 15:09:46 EDT Ventricular Rate:  100 PR Interval:  167 QRS Duration:  71 QT Interval:  390 QTC Calculation: 503 R Axis:   24  Text Interpretation: Sinus tachycardia Low voltage, precordial leads Borderline repolarization abnormality Prolonged  QT interval Confirmed by Melvenia Motto [phone]) on 03/23/2024 3:28:42 PM  Radiology: CT Cervical Spine Wo Contrast Result Date: 03/23/2024 CLINICAL DATA:  Trauma fall EXAM: CT CERVICAL SPINE  WITHOUT CONTRAST TECHNIQUE: Multidetector CT imaging of the cervical spine was performed without intravenous contrast. Multiplanar CT image reconstructions were also generated. RADIATION DOSE REDUCTION: This exam was performed according to the departmental dose-optimization program which includes automated exposure control, adjustment of the mA and/or kV according to patient size and/or use of iterative reconstruction technique. COMPARISON:  CT 03/10/2024, 03/16/2024 FINDINGS: Alignment: Straightening of the cervical spine. No subluxation. Facet alignment is normal Skull base and vertebrae: No acute fracture. No primary bone lesion or focal pathologic process. Soft tissues and spinal canal: No prevertebral fluid or swelling. No visible canal hematoma. Disc levels: Diffuse degenerative changes with diffuse disc space narrowing, moderate severe narrowing at C5-C6, C6-C7 and C7-T1. 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 Multilevel facet degenerative changes. Severe foraminal narrowing at C3-C4. Bulky anterior osteophytes. Upper chest: Negative. Other: None IMPRESSION: 1. Straightening of the cervical spine with advanced degenerative changes. No acute osseous abnormality. 2. 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 Electronically Signed   By: Luke Bun M.D.   On: 03/23/2024 16:45   CT MAXILLOFACIAL WO CONTRAST Result Date: 03/23/2024 CLINICAL DATA:  Unwitnessed fall EXAM: CT MAXILLOFACIAL WITHOUT CONTRAST TECHNIQUE: Multidetector CT imaging of the maxillofacial structures was performed. Multiplanar CT image reconstructions were also generated. RADIATION DOSE REDUCTION: This exam was performed according  to the departmental dose-optimization program which includes automated exposure control, adjustment of the mA and/or kV according to patient size and/or use of iterative reconstruction technique. COMPARISON:  Facial CT 03/10/2024 FINDINGS: Osseous: Mastoid air cells are clear. Mandibular heads are normally positioned. Heterogeneous sclerosis in the right mastoid. Pterygoid plates and zygomatic arches are intact. No acute nasal bone fracture Orbits: Redemonstrated nondisplaced fractures of the medial right orbital wall and inferior right orbital rim. Globes appear intact. No intraorbital soft tissue swelling. Chronic fracture deformity posterior floor of right orbit. Sinuses: Interval opacification of right maxillary sinus. Redemonstrated fractures of the anterior and posterolateral walls of the right maxillary sinus. Soft tissues: Residual right cheek hematoma. Deep laceration left forehead with small right forehead hematoma Limited intracranial: See separately dictated head CT IMPRESSION: 1. Redemonstrated fractures involving the right maxillary sinus and orbit, stable since 03/10/2024. No new fracture abnormality is seen. 2. Residual right cheek hematoma. Deep laceration left forehead with small right forehead scalp hematoma Electronically Signed   By: Luke Bun M.D.   On: 03/23/2024 16:37   DG Pelvis 1-2 Views Result Date: 03/23/2024 CLINICAL DATA:  Trauma fall EXAM: PELVIS - 1-2 VIEW COMPARISON:  03/16/2024 FINDINGS: SI joints are non widened. Pubic symphysis and rami appear intact. Limited assessment of femoral necks due to positioning and superimposition of trochanters. IMPRESSION: Limited assessment of femoral necks due to positioning. If there is high clinical concern for hip fracture, dedicated hip radiographs versus CT follow-up. Otherwise no acute osseous abnormality is seen Electronically Signed   By: Luke Bun M.D.   On: 03/23/2024 16:28   DG Chest 1 View Result Date: 03/23/2024 CLINICAL  DATA:  Fall trauma EXAM: CHEST  1 VIEW COMPARISON:  03/16/2024 FINDINGS: Hypoventilatory changes. No acute airspace disease or effusion. Stable cardiomediastinal silhouette. No pneumothorax IMPRESSION: No active disease. Hypoventilatory changes. Electronically Signed   By: Luke Bun M.D.   On: 03/23/2024 16:27   DG Tibia/Fibula Left Result Date: 03/23/2024 CLINICAL DATA:  Fall EXAM: LEFT TIBIA AND FIBULA - 2 VIEW COMPARISON:  CT knee 02/16/2024 FINDINGS:  No fracture or malalignment. Heterotopic ossification surrounding the proximal fibula with ankylosis of the proximal tibiofibular joint as seen on prior CT. Soft tissues are unremarkable IMPRESSION: No acute osseous abnormality. Electronically Signed   By: Luke Bun M.D.   On: 03/23/2024 16:26   CT Head Wo Contrast Result Date: 03/23/2024 CLINICAL DATA:  Unwitnessed fall laceration to left eyebrow trauma EXAM: CT HEAD WITHOUT CONTRAST TECHNIQUE: Contiguous axial images were obtained from the base of the skull through the vertex without intravenous contrast. RADIATION DOSE REDUCTION: This exam was performed according to the departmental dose-optimization program which includes automated exposure control, adjustment of the mA and/or kV according to patient size and/or use of iterative reconstruction technique. COMPARISON:  CT brain 03/10/2024, 03/16/2024 FINDINGS: Brain: No acute territorial infarction, hemorrhage or intracranial mass. Moderate atrophy. Mild chronic small vessel ischemic changes of the white matter. Stable ventricle size. Vascular: No hyperdense vessels.  Carotid vascular calcification. Skull: No depressed skull fracture. Sinuses/Orbits: See separately dictated facial CT. Other: Laceration and hematoma to the left greater than right forehead. IMPRESSION: 1. No definite CT evidence for acute intracranial abnormality. 2. Atrophy and chronic small vessel ischemic changes of the white matter. 3. Laceration and hematoma to the left greater than  right forehead. Electronically Signed   By: Luke Bun M.D.   On: 03/23/2024 16:24     Procedures   Medications Ordered in the ED  fentaNYL  (SUBLIMAZE ) injection 50 mcg (50 mcg Intravenous Given 03/23/24 1522)  lidocaine -EPINEPHrine -tetracaine  (LET) topical gel (3 mLs Topical Given 03/23/24 1523)  magnesium  oxide (MAG-OX) tablet 800 mg (800 mg Oral Given 03/23/24 1731)  lidocaine -EPINEPHrine  (XYLOCAINE  W/EPI) 2 %-1:200000 (PF) injection 10 mL (10 mLs Intradermal Given 03/23/24 1834)  cefTRIAXone  (ROCEPHIN ) 1 g in sodium chloride  0.9 % 100 mL IVPB (1 g Intravenous New Bag/Given 03/23/24 2037)  potassium chloride  SA (KLOR-CON  M) CR tablet 40 mEq (40 mEq Oral Given 03/23/24 2033)    Clinical Course as of 03/23/24 2124  Tue Mar 23, 2024  1950 10 simple interrupted sutures placed by myself 4-0 ethilon  [CH]    Clinical Course User Index [CH] Hinnant, Terrall FALCON, PA-C                                 Medical Decision Making Amount and/or Complexity of Data Reviewed Labs: ordered. Radiology: ordered.  Risk OTC drugs. Prescription drug management.   This patient presents to the ED for concern of fall, this involves an extensive number of treatment options, and is a complaint that carries with it a high risk of complications and morbidity.  The differential diagnosis includes acute injuries   Co morbidities / Chronic conditions that complicate the patient evaluation  Parkinson's disease, HTN, CVA, PE, anemia.     Additional history obtained:  Additional history obtained from EMR External records from outside source obtained and reviewed including N/A   Lab Tests:  I Ordered, and personally interpreted labs.  The pertinent results include: Baseline creatinine, hypokalemia and hypomagnesemia are present, anemia has baseline.  No leukocytosis is present.  Urinalysis shows concern of UTI.   Imaging Studies ordered:  I ordered imaging studies including x-ray of chest, pelvis, left tibia;  CT scan of head, cervical spine, face I independently visualized and interpreted imaging which showed no acute osseous or intracranial findings I agree with the radiologist interpretation   Cardiac Monitoring: / EKG:  The patient was maintained on a  cardiac monitor.  I personally viewed and interpreted the cardiac monitored which showed an underlying rhythm of: Sinus rhythm   Problem List / ED Course / Critical interventions / Medication management  Patient presenting after ground-level fall.  He describes fall as mechanical due to loss of balance while stumbling backwards.  He does have history of Parkinson's.  He is on Eliquis  and did strike his head during this fall.  He sustained a laceration to left forehead/eyebrow area.  This wound is hemostatic at this time.  Per chart review, last tetanus was in 2021.  Patient has no focal deficits on exam.  He has no areas of deformity.  He has no chest or abdominal tenderness.  He does has some tenderness to left tibial area.  Fentanyl  was ordered for analgesia.  Workup was initiated.  Let gel was applied laceration.  Patient underwent CT scan of head, cervical spine, face.  No acute findings other than the laceration were identified.  Patient underwent laceration repair by PA Hinnant.  Patient's lab work was notable for hypokalemia and hypomagnesemia.  Replacement electrolytes were ordered.  Creatinine appears to be baseline. Patient had difficulty urinating while in the ED.  Bladder scan showed greater than 1 L of retained urine.  In-N-Out catheter was performed with 1.3 L of urine drainage.  Foley catheter was ordered for urine retention.  On urinalysis, there is concern of infection.  Antibiotics were ordered.  Patient was discharged in stable condition. I ordered medication including fentanyl  for analgesia; left and lidocaine  for topical anesthesia; magnesium  oxide and potassium chloride  for electrolyte replacement; ceftriaxone  for UTI Reevaluation of  the patient after these medicines showed that the patient improved I have reviewed the patients home medicines and have made adjustments as needed  Social Determinants of Health:  Frequent hospitalizations      Final diagnoses:  Fall, initial encounter  Laceration of forehead, initial encounter  Urine retention  Cystitis    ED Discharge Orders          Ordered    cephALEXin  (KEFLEX ) 500 MG capsule  3 times daily        03/23/24 1918               Melvenia Motto, MD 03/23/24 2124

## 2024-03-23 NOTE — ED Notes (Signed)
 [facility] called pt to [name]- NO ETA- 1 out!

## 2024-03-23 NOTE — ED Notes (Signed)
 Pt manual BP was obtained when pt first arrived to ED

## 2024-03-23 NOTE — ED Triage Notes (Signed)
 Unwitnessed fall at [facility]. Y shaped lac to the left eyebrow. On blood thinners Eliquis 

## 2024-03-23 NOTE — ED Notes (Signed)
 Pt unwilling to keep tele leads on

## 2024-03-24 ENCOUNTER — Emergency Department (HOSPITAL_COMMUNITY)

## 2024-03-24 ENCOUNTER — Inpatient Hospital Stay (HOSPITAL_COMMUNITY)

## 2024-03-24 ENCOUNTER — Ambulatory Visit (HOSPITAL_COMMUNITY)

## 2024-03-24 ENCOUNTER — Inpatient Hospital Stay (HOSPITAL_COMMUNITY)
Admission: EM | Admit: 2024-03-24 | Discharge: 2024-04-16 | DRG: 082 | Disposition: A | Discharge status: Skilled Nursing Facility | Source: Skilled Nursing Facility | Attending: Internal Medicine | Admitting: Internal Medicine

## 2024-03-24 ENCOUNTER — Other Ambulatory Visit: Payer: Self-pay

## 2024-03-24 DIAGNOSIS — G9341 Metabolic encephalopathy: Secondary | ICD-10-CM | POA: Diagnosis present

## 2024-03-24 DIAGNOSIS — G20A1 Parkinson's disease without dyskinesia, without mention of fluctuations: Secondary | ICD-10-CM | POA: Diagnosis not present

## 2024-03-24 DIAGNOSIS — I951 Orthostatic hypotension: Secondary | ICD-10-CM | POA: Diagnosis present

## 2024-03-24 DIAGNOSIS — Z86711 Personal history of pulmonary embolism: Secondary | ICD-10-CM | POA: Diagnosis not present

## 2024-03-24 DIAGNOSIS — E876 Hypokalemia: Secondary | ICD-10-CM | POA: Diagnosis present

## 2024-03-24 DIAGNOSIS — M549 Dorsalgia, unspecified: Secondary | ICD-10-CM | POA: Diagnosis not present

## 2024-03-24 DIAGNOSIS — S065XAA Traumatic subdural hemorrhage with loss of consciousness status unknown, initial encounter: Secondary | ICD-10-CM | POA: Diagnosis present

## 2024-03-24 DIAGNOSIS — N3001 Acute cystitis with hematuria: Secondary | ICD-10-CM | POA: Diagnosis present

## 2024-03-24 DIAGNOSIS — E66811 Obesity, class 1: Secondary | ICD-10-CM | POA: Diagnosis present

## 2024-03-24 DIAGNOSIS — G4730 Sleep apnea, unspecified: Secondary | ICD-10-CM | POA: Diagnosis present

## 2024-03-24 DIAGNOSIS — N138 Other obstructive and reflux uropathy: Secondary | ICD-10-CM | POA: Diagnosis present

## 2024-03-24 DIAGNOSIS — N401 Enlarged prostate with lower urinary tract symptoms: Secondary | ICD-10-CM | POA: Diagnosis present

## 2024-03-24 DIAGNOSIS — R338 Other retention of urine: Secondary | ICD-10-CM | POA: Diagnosis present

## 2024-03-24 DIAGNOSIS — M5124 Other intervertebral disc displacement, thoracic region: Secondary | ICD-10-CM | POA: Diagnosis not present

## 2024-03-24 DIAGNOSIS — E669 Obesity, unspecified: Secondary | ICD-10-CM | POA: Diagnosis present

## 2024-03-24 DIAGNOSIS — M4802 Spinal stenosis, cervical region: Secondary | ICD-10-CM | POA: Diagnosis present

## 2024-03-24 DIAGNOSIS — S01112A Laceration without foreign body of left eyelid and periocular area, initial encounter: Secondary | ICD-10-CM | POA: Diagnosis present

## 2024-03-24 DIAGNOSIS — F0282 Dementia in other diseases classified elsewhere, unspecified severity, with psychotic disturbance: Secondary | ICD-10-CM | POA: Diagnosis present

## 2024-03-24 DIAGNOSIS — F0280 Dementia in other diseases classified elsewhere without behavioral disturbance: Secondary | ICD-10-CM | POA: Diagnosis not present

## 2024-03-24 DIAGNOSIS — Z8673 Personal history of transient ischemic attack (TIA), and cerebral infarction without residual deficits: Secondary | ICD-10-CM

## 2024-03-24 DIAGNOSIS — N390 Urinary tract infection, site not specified: Secondary | ICD-10-CM

## 2024-03-24 DIAGNOSIS — S0191XA Laceration without foreign body of unspecified part of head, initial encounter: Secondary | ICD-10-CM | POA: Diagnosis present

## 2024-03-24 DIAGNOSIS — W1839XA Other fall on same level, initial encounter: Secondary | ICD-10-CM | POA: Diagnosis present

## 2024-03-24 DIAGNOSIS — S0990XA Unspecified injury of head, initial encounter: Secondary | ICD-10-CM

## 2024-03-24 DIAGNOSIS — Y92122 Bedroom in nursing home as the place of occurrence of the external cause: Secondary | ICD-10-CM | POA: Diagnosis not present

## 2024-03-24 DIAGNOSIS — I10 Essential (primary) hypertension: Secondary | ICD-10-CM | POA: Diagnosis present

## 2024-03-24 DIAGNOSIS — R296 Repeated falls: Secondary | ICD-10-CM

## 2024-03-24 DIAGNOSIS — E538 Deficiency of other specified B group vitamins: Secondary | ICD-10-CM | POA: Diagnosis present

## 2024-03-24 DIAGNOSIS — Z91199 Patient's noncompliance with other medical treatment and regimen due to unspecified reason: Secondary | ICD-10-CM

## 2024-03-24 DIAGNOSIS — N3000 Acute cystitis without hematuria: Secondary | ICD-10-CM

## 2024-03-24 DIAGNOSIS — Z888 Allergy status to other drugs, medicaments and biological substances status: Secondary | ICD-10-CM

## 2024-03-24 DIAGNOSIS — W06XXXA Fall from bed, initial encounter: Secondary | ICD-10-CM | POA: Diagnosis not present

## 2024-03-24 DIAGNOSIS — R4781 Slurred speech: Secondary | ICD-10-CM

## 2024-03-24 DIAGNOSIS — S0240CA Maxillary fracture, right side, initial encounter for closed fracture: Secondary | ICD-10-CM | POA: Diagnosis present

## 2024-03-24 DIAGNOSIS — I6203 Nontraumatic chronic subdural hemorrhage: Secondary | ICD-10-CM | POA: Diagnosis not present

## 2024-03-24 DIAGNOSIS — S0181XA Laceration without foreign body of other part of head, initial encounter: Secondary | ICD-10-CM

## 2024-03-24 DIAGNOSIS — Z87891 Personal history of nicotine dependence: Secondary | ICD-10-CM | POA: Diagnosis not present

## 2024-03-24 DIAGNOSIS — Z6833 Body mass index (BMI) 33.0-33.9, adult: Secondary | ICD-10-CM

## 2024-03-24 DIAGNOSIS — G909 Disorder of the autonomic nervous system, unspecified: Secondary | ICD-10-CM | POA: Diagnosis present

## 2024-03-24 DIAGNOSIS — Z79899 Other long term (current) drug therapy: Secondary | ICD-10-CM | POA: Diagnosis not present

## 2024-03-24 DIAGNOSIS — R4182 Altered mental status, unspecified: Secondary | ICD-10-CM

## 2024-03-24 DIAGNOSIS — G9340 Encephalopathy, unspecified: Secondary | ICD-10-CM | POA: Diagnosis not present

## 2024-03-24 DIAGNOSIS — Z7901 Long term (current) use of anticoagulants: Secondary | ICD-10-CM | POA: Diagnosis not present

## 2024-03-24 DIAGNOSIS — F05 Delirium due to known physiological condition: Secondary | ICD-10-CM | POA: Diagnosis not present

## 2024-03-24 DIAGNOSIS — S02831A Fracture of medial orbital wall, right side, initial encounter for closed fracture: Secondary | ICD-10-CM | POA: Diagnosis present

## 2024-03-24 DIAGNOSIS — M47814 Spondylosis without myelopathy or radiculopathy, thoracic region: Secondary | ICD-10-CM | POA: Diagnosis not present

## 2024-03-24 DIAGNOSIS — G20A2 Parkinson's disease without dyskinesia, with fluctuations: Secondary | ICD-10-CM | POA: Diagnosis present

## 2024-03-24 DIAGNOSIS — M25562 Pain in left knee: Secondary | ICD-10-CM | POA: Diagnosis present

## 2024-03-24 DIAGNOSIS — E871 Hypo-osmolality and hyponatremia: Secondary | ICD-10-CM | POA: Diagnosis present

## 2024-03-24 DIAGNOSIS — R569 Unspecified convulsions: Secondary | ICD-10-CM

## 2024-03-24 DIAGNOSIS — R410 Disorientation, unspecified: Secondary | ICD-10-CM | POA: Diagnosis not present

## 2024-03-24 DIAGNOSIS — I2699 Other pulmonary embolism without acute cor pulmonale: Secondary | ICD-10-CM | POA: Diagnosis present

## 2024-03-24 DIAGNOSIS — G20C Parkinsonism, unspecified: Secondary | ICD-10-CM | POA: Diagnosis not present

## 2024-03-24 DIAGNOSIS — R159 Full incontinence of feces: Secondary | ICD-10-CM | POA: Diagnosis not present

## 2024-03-24 DIAGNOSIS — G20B2 Parkinson's disease with dyskinesia, with fluctuations: Secondary | ICD-10-CM | POA: Diagnosis not present

## 2024-03-24 DIAGNOSIS — W19XXXA Unspecified fall, initial encounter: Principal | ICD-10-CM

## 2024-03-24 DIAGNOSIS — R2681 Unsteadiness on feet: Secondary | ICD-10-CM | POA: Diagnosis present

## 2024-03-24 HISTORY — DX: Metabolic encephalopathy: G93.41

## 2024-03-24 HISTORY — DX: Laceration without foreign body of unspecified part of head, initial encounter: S01.91XA

## 2024-03-24 HISTORY — DX: Repeated falls: R29.6

## 2024-03-24 HISTORY — DX: Hypomagnesemia: E83.42

## 2024-03-24 HISTORY — DX: Benign prostatic hyperplasia with lower urinary tract symptoms: N40.1

## 2024-03-24 HISTORY — DX: Obesity, unspecified: E66.9

## 2024-03-24 HISTORY — DX: Urinary tract infection, site not specified: N39.0

## 2024-03-24 LAB — CBC WITH DIFFERENTIAL/PLATELET
Abs Immature Granulocytes: 0.03 K/uL (ref 0.00–0.07)
Basophils Absolute: 0.1 K/uL (ref 0.0–0.1)
Basophils Relative: 1 %
Eosinophils Absolute: 0.5 K/uL (ref 0.0–0.5)
Eosinophils Relative: 5 %
HCT: 28.7 % — ABNORMAL LOW (ref 39.0–52.0)
Hemoglobin: 8.9 g/dL — ABNORMAL LOW (ref 13.0–17.0)
Immature Granulocytes: 0 %
Lymphocytes Relative: 14 %
Lymphs Abs: 1.3 K/uL (ref 0.7–4.0)
MCH: 27.2 pg (ref 26.0–34.0)
MCHC: 31 g/dL (ref 30.0–36.0)
MCV: 87.8 fL (ref 80.0–100.0)
Monocytes Absolute: 0.6 K/uL (ref 0.1–1.0)
Monocytes Relative: 7 %
Neutro Abs: 6.7 K/uL (ref 1.7–7.7)
Neutrophils Relative %: 73 %
Platelets: 304 K/uL (ref 150–400)
RBC: 3.27 MIL/uL — ABNORMAL LOW (ref 4.22–5.81)
RDW: 15.1 % (ref 11.5–15.5)
WBC: 9.2 K/uL (ref 4.0–10.5)
nRBC: 0 % (ref 0.0–0.2)

## 2024-03-24 LAB — COMPREHENSIVE METABOLIC PANEL WITH GFR
ALT: <5 U/L (ref 0–44)
AST: 17 U/L (ref 15–41)
Albumin: 3 g/dL — ABNORMAL LOW (ref 3.5–5.0)
Alkaline Phosphatase: 83 U/L (ref 38–126)
Anion gap: 9 (ref 5–15)
BUN: 14 mg/dL (ref 8–23)
CO2: 19 mmol/L — ABNORMAL LOW (ref 22–32)
Calcium: 8.7 mg/dL — ABNORMAL LOW (ref 8.9–10.3)
Chloride: 110 mmol/L (ref 98–111)
Creatinine, Ser: 1.12 mg/dL (ref 0.61–1.24)
GFR, Estimated: >60 mL/min (ref >60)
Glucose, Bld: 122 mg/dL — ABNORMAL HIGH (ref 70–99)
Potassium: 3.5 mmol/L (ref 3.5–5.1)
Sodium: 138 mmol/L (ref 135–145)
Total Bilirubin: 1.2 mg/dL (ref 0.0–1.2)
Total Protein: 5.9 g/dL — ABNORMAL LOW (ref 6.5–8.1)

## 2024-03-24 LAB — FOLATE: Folate: 10.6 ng/mL (ref >5.9)

## 2024-03-24 LAB — MAGNESIUM: Magnesium: 1.6 mg/dL — ABNORMAL LOW (ref 1.7–2.4)

## 2024-03-24 MED ORDER — LORAZEPAM 2 MG/ML IJ SOLN
0.5000 mg | Freq: Once | INTRAMUSCULAR | Status: AC
  Administered 2024-03-24: 0.5 mg via INTRAVENOUS
  Filled 2024-03-24: qty 1

## 2024-03-24 MED ORDER — MAGNESIUM SULFATE IN D5W 1-5 GM/100ML-% IV SOLN
1.0000 g | Freq: Once | INTRAVENOUS | Status: AC
  Administered 2024-03-24: 1 g via INTRAVENOUS
  Filled 2024-03-24: qty 100

## 2024-03-24 MED ORDER — SODIUM CHLORIDE 0.9% FLUSH
3.0000 mL | Freq: Two times a day (BID) | INTRAVENOUS | Status: DC
  Administered 2024-03-24 – 2024-04-07 (×27): 3 mL via INTRAVENOUS

## 2024-03-24 MED ORDER — CARBIDOPA-LEVODOPA ER 50-200 MG PO TBCR
1.0000 | EXTENDED_RELEASE_TABLET | ORAL | Status: DC
  Administered 2024-03-25 – 2024-04-16 (×124): 1 via ORAL
  Filled 2024-03-24 (×142): qty 1

## 2024-03-24 MED ORDER — SODIUM CHLORIDE 0.9 % IV SOLN
1.0000 g | INTRAVENOUS | Status: AC
  Administered 2024-03-24 – 2024-03-30 (×7): 1 g via INTRAVENOUS
  Filled 2024-03-24 (×7): qty 10

## 2024-03-24 MED ORDER — ONDANSETRON HCL 4 MG PO TABS
4.0000 mg | ORAL_TABLET | Freq: Four times a day (QID) | ORAL | Status: DC | PRN
  Administered 2024-04-14: 4 mg via ORAL
  Filled 2024-03-24: qty 1

## 2024-03-24 MED ORDER — ALBUTEROL SULFATE (2.5 MG/3ML) 0.083% IN NEBU: 2.5000 mg | INHALATION_SOLUTION | Freq: Four times a day (QID) | RESPIRATORY_TRACT | Status: DC | PRN

## 2024-03-24 MED ORDER — CARBIDOPA-LEVODOPA ER 50-200 MG PO TBCR
1.0000 | EXTENDED_RELEASE_TABLET | Freq: Once | ORAL | Status: AC
  Administered 2024-03-24: 1 via ORAL
  Filled 2024-03-24: qty 1

## 2024-03-24 MED ORDER — CHLORHEXIDINE GLUCONATE CLOTH 2 % EX PADS
6.0000 | MEDICATED_PAD | Freq: Every day | CUTANEOUS | Status: DC
  Administered 2024-03-24 – 2024-04-16 (×22): 6 via TOPICAL

## 2024-03-24 MED ORDER — ONDANSETRON HCL 4 MG/2ML IJ SOLN: 4.0000 mg | Freq: Four times a day (QID) | INTRAMUSCULAR | Status: DC | PRN

## 2024-03-24 MED ORDER — LORAZEPAM 2 MG/ML IJ SOLN
1.0000 mg | Freq: Once | INTRAMUSCULAR | Status: AC
  Administered 2024-03-24: 1 mg via INTRAVENOUS
  Filled 2024-03-24: qty 1

## 2024-03-24 MED ORDER — VITAMIN B-12 1000 MCG PO TABS: 1000.0000 ug | ORAL_TABLET | Freq: Every day | ORAL | Status: DC

## 2024-03-24 MED ORDER — ACETAMINOPHEN 650 MG RE SUPP
650.0000 mg | Freq: Four times a day (QID) | RECTAL | Status: DC | PRN
  Administered 2024-03-24: 650 mg via RECTAL
  Filled 2024-03-24: qty 1

## 2024-03-24 MED ORDER — LIDOCAINE-EPINEPHRINE (PF) 2 %-1:200000 IJ SOLN
10.0000 mL | Freq: Once | INTRAMUSCULAR | Status: AC
  Administered 2024-03-24: 10 mL
  Filled 2024-03-24: qty 20

## 2024-03-24 MED ORDER — ACETAMINOPHEN 325 MG PO TABS
650.0000 mg | ORAL_TABLET | Freq: Four times a day (QID) | ORAL | Status: DC | PRN
  Administered 2024-03-25 – 2024-04-12 (×5): 650 mg via ORAL
  Filled 2024-03-24 (×5): qty 2

## 2024-03-24 MED ORDER — LORAZEPAM 2 MG/ML IJ SOLN
0.5000 mg | Freq: Four times a day (QID) | INTRAMUSCULAR | Status: DC | PRN
  Administered 2024-03-24 – 2024-04-02 (×5): 0.5 mg via INTRAVENOUS
  Filled 2024-03-24 (×6): qty 1

## 2024-03-24 MED ORDER — ACETAMINOPHEN 500 MG PO TABS
1000.0000 mg | ORAL_TABLET | Freq: Once | ORAL | Status: AC
  Administered 2024-03-24: 1000 mg via ORAL
  Filled 2024-03-24: qty 2

## 2024-03-24 MED ORDER — LACTATED RINGERS IV SOLN: INTRAVENOUS | Status: DC

## 2024-03-24 NOTE — Consult Note (Signed)
 NEUROLOGY CONSULT NOTE   Date of service: March 24, 2024 Patient Name: John [name] MRN:  [id number] DOB:  10/31/1960 Chief Complaint: Frequent falls Requesting Provider: Claudene Maximino LABOR, MD  History of Present Illness  John [name] is a 63 y.o. male with hx of recent PE on eliquis , HTN, parkinsons, and stroke presenting after a fall. He was hospitalized 6/25-7/1 after being diagnosed with pulmonary embolisms and started on eliquis . He was then admitted back to the hospital as a trauma fall on thinners. Neurology was engaged due to concern for seizure activity, however post syncopal convulsions were thought to be more likely due to his rapid improvement. He was discharged 7/7. He was additionally seen yesterday in the ED after a fall with facial trauma. He had two lacerations repaired and then was discharged back to his skilled nursing facility. Today he sustained another fall and was brought back to the ED, he had reported to the facility that he to gotten out of bed to go to work although he is not working.  On initial arrival to the ED he was uncooperative and pulling on cords, but was reportedly oriented to person place and time per ED provider note as well as per nurse at bedside.  However he again had a fall out of bed while in the ED and pulled his Foley catheter out, reported to nursing that he wanted to take a walk. He has received 1.5mg  of ativan  for safety due to nonavailability of a sitter for redirection.   On initial exam he has spontaneous jerking movements, does not awaken to noxious stimuli, and does have intermittent episodes of apnea.   He currently follows with Dr. Skeet with [facility]. He is currently on carbidopa -levodopa  50/200mg  6 times per day. Exam with Dr. Skeet in March 2025 with hypomimia, increased tone in elbows and wrists, bradykinesia but no tremor. He did have a shuffling gait as well.   Per notes from his last hospitalization medicine team  discussed his situation with family discussion with patient's sister via phone on 6/27.  She stated that patient has been having unsteady gait for more than a year, progressively worsening with frequent falls.  She does not think that he is able to care for himself.  We agreed that after short-term rehab at [facility], depending on how he does, he will either need home 24/7 assistance and supervision which if not available then may need LTC.  Additional history from sister:  Sister notes several falls the last week of June, sent to [name] for short term rehab, fell again within 2 hours, and then a nurses aide had concern for seizure after a second fall   Prior to this he was sharing a home with an elderly couple (roommate situation), separate living quarters. His area of the home was unclean, feces throughout bathroom but also poopy clothes in bedroom and garbage everywhere. She thinks this was a relatively recent within the last few months. Found many pills scattered on the floor, including tylenol  but also prescription medications -- thinks maybe [phone] pills in total). Tried to pick up Parkinson's meds early that day on the way to the hospital as well, thought it was time for a refill and was upset when told it was too soon.   Just approved for social security disability in the last couple of months -- had been working at [employer] until this was processed, drove to her house the week before (but she feels he shouldn't have been  driving, didn't feel comfortable with her driving her kids). Even 1.5 years ago driving concerns (drove long distance with family there were concerns about his ability to drive safely, making poor judgements while driving). Worked 2nd shift, late afternoon to 10 to 14 PM, had been having falls at work as well but no other clear concerns at work.    Concern for poor nutrition due to poor eating habits  She is not sure if there has had any financial difficulties as she is not  privy to that information.  She encourages him to use a walker but he is reluctant to use it.  With many of his falls he has declined transport to the hospital.  She also notes that he did not start to seek care for his Parkinson's until she insisted that he go to a doctor due to how severe his gait issues were becoming  June 22nd he was talking / speaking normally but moving slower than normal. During that hospitalization significant confusion such as calling family and telling them he was being discharged. This did gradually improve, to 75% of normal but unclear if he ever got back to 100%. Denies any prior confusional episodes at home   ROS   Unable to ascertain due to altered mental status  Past History   Past Medical History:  Diagnosis Date   Bilateral pulmonary embolism (HCC) 03/16/2024   Hypertension    Parkinson disease (HCC) 03/16/2024   Parkinson's disease (HCC)    Stroke (HCC)     No past surgical history on file.  Family History: Family History  Problem Relation Age of Onset   Cancer Father     Social History  reports that he quit smoking about 25 years ago. His smoking use included cigars. He has never used smokeless tobacco. He reports current alcohol use. He reports that he does not use drugs.  Allergies  Allergen Reactions   Tamsulosin  Hcl Anxiety and Other (See Comments)    Patient states it makes him extremelyjittery    Medications   Current Facility-Administered Medications:    acetaminophen  (TYLENOL ) tablet 650 mg, 650 mg, Oral, Q6H PRN **OR** acetaminophen  (TYLENOL ) suppository 650 mg, 650 mg, Rectal, Q6H PRN, Claudene, Rondell A, MD   albuterol  (PROVENTIL ) (2.5 MG/3ML) 0.083% nebulizer solution 2.5 mg, 2.5 mg, Nebulization, Q6H PRN, Claudene, Rondell A, MD   carbidopa -levodopa  (SINEMET  CR) 50-200 MG per tablet controlled release 1 tablet, 1 tablet, Oral, See admin instructions, Smith, Rondell A, MD   cefTRIAXone  (ROCEPHIN ) 1 g in sodium chloride  0.9 %  100 mL IVPB, 1 g, Intravenous, Q24H, Smith, Rondell A, MD   lactated ringers  infusion, , Intravenous, Continuous, Plunkett, Whitney, MD   magnesium  sulfate IVPB 1 g 100 mL, 1 g, Intravenous, Once, Smith, Rondell A, MD   ondansetron  (ZOFRAN ) tablet 4 mg, 4 mg, Oral, Q6H PRN **OR** ondansetron  (ZOFRAN ) injection 4 mg, 4 mg, Intravenous, Q6H PRN, Smith, Rondell A, MD   sodium chloride  flush (NS) 0.9 % injection 3 mL, 3 mL, Intravenous, Q12H, Smith, Rondell A, MD, 3 mL at 03/24/24 1225  Current Outpatient Medications:    acetaminophen  (TYLENOL ) 500 MG tablet, Take 1 tablet (500 mg total) by mouth every 6 (six) hours as needed for mild pain (pain score 1-3), moderate pain (pain score 4-6) or headache., Disp: , Rfl:    apixaban  (ELIQUIS ) 5 MG TABS tablet, Take 2 tabs (10 mg total) twice a day through 03/18/2024.  From 03/19/2024, start taking 1 tab (5 Mg total)  twice daily., Disp: , Rfl:    apixaban  (ELIQUIS ) 5 MG TABS tablet, Take 5 mg by mouth 2 (two) times daily. (Patient not taking: Reported on 03/16/2024), Disp: , Rfl:    carbidopa -levodopa  (SINEMET  CR) 50-200 MG tablet, Take 1 tablet by mouth 6 (six) times daily. (4-5 AM, 8 AM, 11 AM, 2 PM, 5 PM, 8 PM)., Disp: 180 tablet, Rfl: 5   carbidopa -levodopa  (SINEMET  CR) 50-200 MG tablet, Take 1 tablet by mouth See admin instructions. Take one tablet by mouth six times daily using the following schedule:  (4-5am), 8am, 11am, 2pm, 5pm, and 8pm., Disp: , Rfl:    cephALEXin  (KEFLEX ) 500 MG capsule, Take 1 capsule (500 mg total) by mouth 3 (three) times daily for 5 days., Disp: 15 capsule, Rfl: 0   cyanocobalamin  1000 MCG tablet, Take 1 tablet (1,000 mcg total) by mouth daily., Disp: 30 tablet, Rfl: 0   Multiple Vitamin (MULTIVITAMIN WITH MINERALS) TABS tablet, Take 1 tablet by mouth daily., Disp: 30 tablet, Rfl: 0   omeprazole  (PRILOSEC ) 20 MG capsule, Take 1 capsule (20 mg total) by mouth daily., Disp: 90 capsule, Rfl: 3   omeprazole  (PRILOSEC ) 20 MG capsule, Take 20  mg by mouth daily., Disp: , Rfl:   Vitals   Vitals:   03/24/24 0709 03/24/24 0724 03/24/24 0743 03/24/24 1100  BP:  130/84  102/77  Pulse: (!) 106   (!) 104  Resp: 17     Temp:   99.7 F (37.6 C)   TempSrc:   Rectal   SpO2: 97%   98%  Weight:      Height:        Body mass index is 33.4 kg/m.   Physical Exam   Constitutional: Appears well-developed and well-nourished.  Psych: Unresponsive Eyes: No scleral injection.  HENT: No OP obstruction.  Head: Multiple areas of contusions, 2 lacerations on the front of his face.  Tender to touch of the left eyebrow in particular Cardiovascular: Tachycardic Respiratory: Sleep apnea, now on 2L while sleeping  GI: Soft.  No distension. There is no tenderness.  Skin: WDI.   Neurologic Examination   Neuro: Mental Status: Patient is sleeping, snoring respirations Cranial Nerves: II: Does not blink to threat. Pupils are equal, round, and reactive to light.   III,IV, CP:Zbzd are midline with forced eye opening.  Intermittent roving eye movements  VII: Facial movement is symmetric resting VIII: Hearing is intact to voice X: Palate elevates symmetrically XII: Tongue protrudes midline without atrophy or fasciculations.  Motor: Tone is increased in bilateral upper extremities. Difficulty moving lower extremities due to jerking movements and inability for patient cooperation. Bulk is normal.  Moves all extremities purposefully with noxious stimuli  Sensory: Withdraws in bilateral lower extremities  Patellars: Up going bilaterally Cerebellar: Spontaneous jerking movements noted in bilateral upper and lower extremities   Attending examination: In 4-point restraints, intermittent myoclonic type jerking of all 4 limbs, not typical for seizure activity.  Pupils equal round reactive to light.  Roving eye movements.  Corneals symmetric to eyelash brush bilaterally, somewhat more tender to the left eye compared to the right.  Face grossly  symmetric on grimace.  Strength grossly symmetric on attempting to move within the bed despite restraints.  Not following any commands.  Nonverbal.  Moaning intermittently   Labs/Imaging/Neurodiagnostic studies   CBC:  Recent Labs  Lab 2024/04/17 0420 03/23/24 1517 03/23/24 1527 03/24/24 0707  WBC 7.4 8.8  --  9.2  NEUTROABS 4.6  --   --  6.7  HGB 8.6* 9.4* 9.5* 8.9*  HCT 26.9* 30.2* 28.0* 28.7*  MCV 86.2 88.8  --  87.8  PLT 278 318  --  304   Basic Metabolic Panel:  Lab Results  Component Value Date   NA 138 03/24/2024   K 3.5 03/24/2024   CO2 19 (L) 03/24/2024   GLUCOSE 122 (H) 03/24/2024   BUN 14 03/24/2024   CREATININE 1.12 03/24/2024   CALCIUM 8.7 (L) 03/24/2024   GFRNONAA >60 03/24/2024   GFRAA 88 03/13/2020   Lipid Panel:  Lab Results  Component Value Date   LDLCALC 73 12/10/2023   Alcohol Level     Component Value Date/Time   [facility] <15 03/16/2024 2143   INR  Lab Results  Component Value Date   INR 1.3 (H) 03/23/2024   CT Head without contrast(Personally reviewed): Right forehead hematoma and laceration. Cerebral atrophy and cerebral white matter chronic small vessel ischemic disease. Redemonstrated chronic lacunar infarct in the left basal ganglia region.  CT CHEST, ABDOMEN, AND PELVIS WITH CONTRAST 03/10/2024 1. Bilateral lobar and segmental pulmonary artery emboli with right heart straining. Correlation with EKG and troponin levels recommended. 2. Nondisplaced fracture through the posterior elements of S3. 3. Fractures of the anterior right fifth and sixth ribs, likely subacute or old. Correlation with clinical exam and point tenderness recommended. 4. No bowel obstruction. Normal appendix. 5. Aortic Atherosclerosis (ICD10-I70.0).  CT C-spine 1. No evidence of an acute cervical spine fracture. 2. Unchanged mild anterior wedge deformity of the T1 vertebral body. 3. Levocurvature of the cervical spine. 4. Nonspecific reversal of the expected  cervical lordosis. 5. Cervical spondylosis as described within body of the report. 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 6. C5-C6 vertebral ankylosis.  Lab Results  Component Value Date   VITAMINB12 206 03/11/2024     Neurodiagnostics rEEG: 03/17/2024 IMPRESSION: This study is within normal limits. No seizures or epileptiform discharges were seen throughout the recording. A normal interictal EEG does not exclude the diagnosis of epilepsy.   ASSESSMENT   John [name] is a 63 y.o. male with a past medical history of parkinson's disease, HTN, recent PE now on eliquis  presenting after multiple falls and altered mental status.   On NP enterance to the room he is having periods of sleep apnea with O2 sats in the mid 80s. He has recently received ativan , does not awaken with noxious stimuli. 2L [place] placed on patient with improvement in SpO2.   MD 37-minute discussion with sister via phone.  Significant gradual decline consistent with progressive Parkinson's disease.  Unable to safely manage medications at home or maintain home in a safe living condition.  Some possible cognitive decline as well although was still holding down his job at [employer] until disability recently approved within the past 2 months or so.  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 - Potential for C-spine injury - Potential postconcussive syndrome - Possible underlying seizure, though no clear description of seizures - Delirium in the setting of pain, hospitalization, head injury, medications - Concern for underlying poor nutritional status  RECOMMENDATIONS  - MRI Brain w wo when mental status allows; if MRI will be significantly delayed would repeat head CT due to recurrent fall in the ED -Would also obtain MRI cervical spine without contrast to rule acute cord  compression contributing given moderate to severe canal stenosis on CT cervical spine and  many falls - EEG STAT to rule out non-convulsive status - Low normal B12 at 206, check MMA to see if functionally deficient and in the interim start empiric supplementation for goal level greater than 600 - Additional myelopathy labs: zinc , copper , folate, Vitamin E - nutritional causes for altered mental status: B1, B3, B6 - Please continue carbidopa /levodopa  at his home dose - Neurology will follow along ______________________________________________________________________    Signed, Jorene Last, NP [facility]  Attending Neurologist's note:  I personally saw this patient, gathering history, performing a full neurologic examination, reviewing relevant labs, personally reviewing relevant imaging including head CT, and formulated the assessment and plan, adding the note above for completeness and clarity to accurately reflect my thoughts  Lola Jernigan MD-PhD [facility] [phone] Available 7 AM to 7 PM, outside these hours please contact Neurologist on call listed on AMION

## 2024-03-24 NOTE — ED Notes (Signed)
 Pt continues to thrash around on stretcher. Both legs coming in to the air and then slamming down. Pt then winces in pain. He is also thrashing arms around and fists are clenched in the mitts.

## 2024-03-24 NOTE — ED Triage Notes (Signed)
 Pt BIB [facility] from [name] b/c of fall. Pt was found by staff on the floor. Per pt he was getting up to go to work, fell and hit his head on the door. Pt has 3cm lac to head, bleeding controlled at this time. Pt is on Eliquis . No LOC

## 2024-03-24 NOTE — ED Notes (Signed)
 Requested sitter from staffing, none available until 3pm unless another case gets discontinued

## 2024-03-24 NOTE — H&P (Signed)
 History and Physical    Patient: John [name] FMW:[id number] DOB: 1961/03/03 DOA: 03/24/2024 DOS: the patient was seen and examined on 03/24/2024 PCP: [name], MD  Patient coming from: [facility] via EMS  Chief Complaint:  Chief Complaint  Patient presents with   Fall   HPI: John [name] is a 63 y.o. male with medical history significant of hypertension, CVA, Parkinson's disease, history of pulmonary embolism on Eliquis  who presents after having a fall.  Just recently discharged from hospital 2 days ago after presenting with a concern for seizure with AKI and hyponatremia which resolved.  Patient was sent to SNF but returned to the ED yesterday after having a fall with facial trauma.  Records note patient had negative CT scan of the head at that time facial injury was repaired with stitches and there was concern for urinary retention with possible UTI for which patient had been given Rocephin .  After getting discharged back to the SNF he was noted to have another fall this morning and brought back to the ED.  It was reported that he stated that he had gotten out of bed to go to work and although he is not working.  In the ED patient was noted to be afebrile with pulse elevated up to 111, and all other vital signs maintained.  Labs noted hemoglobin 8.9 and magnesium  1.6.  Repeat CT of the head, maxillofacial, and cervical spine were obtained noting right forehead hematoma with laceration, and nonacute fractures of the right maxillary sinus medial right orbital wall and right inferior orbital rim.  Laceration was which was repaired by the ED provider.  Patient was noted to be altered while in the ED and also fell out of bed.  Patient had been given Ativan  1.5 mg IV, Tylenol  1000 mg p.o., and  sinemet  resumed.  While in the ED patient had been noted to have fallen out of bed and  ripped out his Foley catheter which was able to be replaced.  Review of Systems: As mentioned in the history  of present illness. All other systems reviewed and are negative. Past Medical History:  Diagnosis Date   Bilateral pulmonary embolism (HCC) 03/16/2024   Hypertension    Parkinson disease (HCC) 03/16/2024   Parkinson's disease (HCC)    Stroke (HCC)    No past surgical history on file. Social History:  reports that he quit smoking about 25 years ago. His smoking use included cigars. He has never used smokeless tobacco. He reports current alcohol use. He reports that he does not use drugs.  Allergies  Allergen Reactions   Tamsulosin  Hcl Anxiety and Other (See Comments)    Patient states it makes him extremelyjittery    Family History  Problem Relation Age of Onset   Cancer Father     Prior to Admission medications   Medication Sig Start Date End Date Taking? Authorizing Provider  acetaminophen  (TYLENOL ) 500 MG tablet Take 1 tablet (500 mg total) by mouth every 6 (six) hours as needed for mild pain (pain score 1-3), moderate pain (pain score 4-6) or headache. 03/16/24   Hongalgi, Anand D, MD  apixaban  (ELIQUIS ) 5 MG TABS tablet Take 2 tabs (10 mg total) twice a day through 03/18/2024.  From 03/19/2024, start taking 1 tab (5 Mg total) twice daily. 03/16/24   Hongalgi, Anand D, MD  apixaban  (ELIQUIS ) 5 MG TABS tablet Take 5 mg by mouth 2 (two) times daily. Patient not taking: Reported on 03/16/2024 03/19/24  [provider]  carbidopa -levodopa  (SINEMET  CR) 50-200 MG tablet Take 1 tablet by mouth 6 (six) times daily. (4-5 AM, 8 AM, 11 AM, 2 PM, 5 PM, 8 PM). 12/08/23   Jaffe, Adam R, DO  carbidopa -levodopa  (SINEMET  CR) 50-200 MG tablet Take 1 tablet by mouth See admin instructions. Take one tablet by mouth six times daily using the following schedule:  (4-5am), 8am, 11am, 2pm, 5pm, and 8pm. 02/18/24   [provider]  cephALEXin  (KEFLEX ) 500 MG capsule Take 1 capsule (500 mg total) by mouth 3 (three) times daily for 5 days. 03/23/24 03/28/24  Melvenia Motto, MD  cyanocobalamin  1000 MCG tablet  Take 1 tablet (1,000 mcg total) by mouth daily. 03/23/24   Rashid, Farhan, MD  Multiple Vitamin (MULTIVITAMIN WITH MINERALS) TABS tablet Take 1 tablet by mouth daily. 03/23/24   Rashid, Farhan, MD  omeprazole  (PRILOSEC ) 20 MG capsule Take 1 capsule (20 mg total) by mouth daily. 12/10/23   [name], MD  omeprazole  (PRILOSEC ) 20 MG capsule Take 20 mg by mouth daily. 12/10/23   [provider]    Physical Exam: Vitals:   03/24/24 0708 03/24/24 0709 03/24/24 0724 03/24/24 0743  BP:   130/84   Pulse: (!) 105 (!) 106    Resp: (!) 21 17    Temp:    99.7 F (37.6 C)  TempSrc:    Rectal  SpO2: 96% 97%    Weight:      Height:       Constitutional: Elderly male who is not really following commands at this time. Eyes: Periorbital bruising appreciated.  Left eyelid lag.  3 facial lacerations 2 which are sutured and bruising present. ENMT: Mucous membranes are moist.  Fair dentition. Neck: normal. Respiratory: clear to auscultation bilaterally, no wheezing, no crackles.  On 2 L nasal cannula oxygen. Cardiovascular: Tachycardic. No extremity edema. 2+ pedal pulses. No carotid bruits.  Abdomen: no tenderness, no masses palpated.   Bowel sounds positive.  Foley catheter now in place. Musculoskeletal: no clubbing / cyanosis. No joint deformity upper and lower extremities. Good ROM, no contractures. Normal muscle tone.  Skin: Multiple areas of bruising of the upper and lower extremities.  Lacerations Neurologic: CN 2-12 grossly intact.  Resting tremor present.  Intermittent jerking movements seem no uncontrollable Psychiatric: Lethargic, not following commands  Data Reviewed:  Reviewed labs, imaging, and pertinent records as documented  Assessment and Plan:  Frequent falls Laceration to forehead Patient presents after having a fall after getting discharged back to [name] yesterday.  Patient has a history of gait disturbance with frequent falls likely related to his history of  Parkinson disease.  CT scan of the cervical spine noted concern for cervical spinal stenosis.  Laceration repaired with sutures. - Admit to a medical telemetry bed - Bed alarm on - Soft restraints - Check MRI of the cervical spine - PT to evaluate and treat - Sitter to bedside - Transitions of care consulted  Urinary tract infection Prior to arrival.  Urinalysis from yesterday significant for small hemoglobin, many bacteria present, and 11-20 WBCs.  Patient had been given 1 dose of Rocephin  IV - Check urine culture - Continue Rocephin   Acute metabolic encephalopathy Patient noted to be altered with slurred speech.  CT scan of the head did not note any acute abnormality.  Unclear if symptoms are related to a concussion with repeated falls with trauma to the head or possibly progression of Parkinson's disease. - Neurochecks - Follow-up pending - Check  EEG - Check MRI  - Appreciate neurology plan of services  Hypomagnesemia Acute.  Magnesium  noted to be 1.6. - Give magnesium  sulfate 1 g IV - Continue to monitor and replace as needed  Pulmonary embolism Patient found to have bilateral pulmonary embolism during previous hospitalization - Continue Eliquis , but with the repeated falls question the need of IVC filter placement instead  Parkinson's disease - Continue Sinemet   BPH with urinary obstruction - Continue Foley catheter  Obesity BMI 33.4 kg/m  DVT prophylaxis: Eliquis   Advance Care Planning:   Code Status: Full Code   Consults: Neurology  Family Communication:   Severity of Illness: The appropriate patient status for this patient is OBSERVATION. Observation status is judged to be reasonable and necessary in order to provide the required intensity of service to ensure the patient's safety. The patient's presenting symptoms, physical exam findings, and initial radiographic and laboratory data in the context of their medical condition is felt to place them at decreased  risk for further clinical deterioration. Furthermore, it is anticipated that the patient will be medically stable for discharge from the hospital within 2 midnights of admission.   Author: [name], MD 03/24/2024 11:16 AM  For on call review [email or web address].

## 2024-03-24 NOTE — ED Notes (Signed)
 Patient transported to CT

## 2024-03-24 NOTE — ED Notes (Signed)
 Neurology at bedside.

## 2024-03-24 NOTE — ED Notes (Signed)
 EDP at bedside

## 2024-03-24 NOTE — ED Notes (Signed)
 Pt placed on 2L [place] for apneic breathing

## 2024-03-24 NOTE — Progress Notes (Signed)
 EEG complete - results pending

## 2024-03-24 NOTE — ED Notes (Addendum)
 Pt keeps pulling monitoring cords off. Reaching up into the air.  New cath leg strap placed.

## 2024-03-24 NOTE — ED Notes (Signed)
 Pt found on floor with blood and feces around him. Pt states he was trying to get the catheter unstuck. Pt also states that he was trying to take a walk  Pt pulled catheter out with balloon intact. Pt has new small abrasion to right forehead. Pt cleaned and placed back in bed.  Pt placed in mitts to keep him from pulling the new catheter out. Pt has small abrasion to right knee. Pt continues to thrash around on stretcher.

## 2024-03-24 NOTE — ED Notes (Signed)
 PT removed foley with 10mm balloon intact, slight blood at meatus, new foley inserted to replace at 20mm inflation per Dr Doretha, PT disoriented but tolerated replacement foley well

## 2024-03-24 NOTE — ED Notes (Signed)
 Admitting MD Claudene at bedside. Pt placed in soft restraints, mittens in tact. Rails also wrapped with blankets and floor mat in place.

## 2024-03-24 NOTE — ED Notes (Signed)
Pt placed in soft restraints. 

## 2024-03-24 NOTE — Procedures (Addendum)
 Patient Name: John [name]  MRN: [id number]  Epilepsy Attending: Arlin MALVA Krebs  Referring Physician/Provider: Remi Pippin, NP  Date: 03/24/2024  Duration: 23.25 mins  Patient history:  63 y.o. male with a past medical history of parkinson's disease, HTN, recent PE now on eliquis  presenting after multiple falls and altered mental status. EEG to evaluate for seizure    Level of alertness: Awake  AEDs during EEG study: Ativan   Technical aspects: This EEG study was done with scalp electrodes positioned according to the 10-20 International system of electrode placement. Electrical activity was reviewed with band pass filter of 1-70Hz , sensitivity of 7 uV/mm, display speed of 29mm/sec with a 60Hz  notched filter applied as appropriate. EEG data were recorded continuously and digitally stored.  Video monitoring was available and reviewed as appropriate.  Description: The posterior dominant rhythm consists of 8 Hz activity of moderate voltage (25-35 uV) seen predominantly in posterior head regions, symmetric and reactive to eye opening and eye closing. Hyperventilation and photic stimulation were not performed.      IMPRESSION: This study is within normal limits. No seizures or epileptiform discharges were seen throughout the recording.   A normal interictal EEG does not exclude the diagnosis of epilepsy.   John [name]

## 2024-03-24 NOTE — ED Provider Notes (Addendum)
 [place] EMERGENCY DEPARTMENT AT [facility] Provider Note   CSN: [id number] Arrival date & time: 03/24/24  9297     Patient presents with: John [name] is a 63 y.o. male.   Pt is a 62y/o male with hx of Parkinson disease, frequent falls with facial fractures, anemia, and bilateral pulmonary embolism at the end of June now on Eliquis  with recent hospitalization and d/c 2 days ago after being r/o for sz but AKI and hyponatremia which resolved who was sent to SNF due to frequent falls and then fell yesterday with facial trauma and seen in the ER with neg head scan, facial injury repair and hypoK, hypomg and urinary retention and possible UTI who was d/ced back and returning today due to getting out of bed this morning and falling again and hitting his face on the floor now with laceration to the right side of the forehead and left knee pain.  Staff did not witness this fall this was the report by the patient.  Patient still has Foley intact.  Received a dose of Rocephin  yesterday.  He denies any neck pain, chest pain, abdominal pain or shortness of breath.  He denies any change in his vision.  Tetanus shot is up-to-date.  The history is provided by the patient, medical records, the EMS personnel and the nursing home.  Fall       Prior to Admission medications   Medication Sig Start Date End Date Taking? Authorizing Provider  acetaminophen  (TYLENOL ) 500 MG tablet Take 1 tablet (500 mg total) by mouth every 6 (six) hours as needed for mild pain (pain score 1-3), moderate pain (pain score 4-6) or headache. 03/16/24   Hongalgi, Anand D, MD  apixaban  (ELIQUIS ) 5 MG TABS tablet Take 2 tabs (10 mg total) twice a day through 03/18/2024.  From 03/19/2024, start taking 1 tab (5 Mg total) twice daily. 03/16/24   Hongalgi, Anand D, MD  apixaban  (ELIQUIS ) 5 MG TABS tablet Take 5 mg by mouth 2 (two) times daily. Patient not taking: Reported on 03/16/2024 03/19/24   [provider]   carbidopa -levodopa  (SINEMET  CR) 50-200 MG tablet Take 1 tablet by mouth 6 (six) times daily. (4-5 AM, 8 AM, 11 AM, 2 PM, 5 PM, 8 PM). 12/08/23   Jaffe, Adam R, DO  carbidopa -levodopa  (SINEMET  CR) 50-200 MG tablet Take 1 tablet by mouth See admin instructions. Take one tablet by mouth six times daily using the following schedule:  (4-5am), 8am, 11am, 2pm, 5pm, and 8pm. 02/18/24   [provider]  cephALEXin  (KEFLEX ) 500 MG capsule Take 1 capsule (500 mg total) by mouth 3 (three) times daily for 5 days. 03/23/24 03/28/24  Melvenia Motto, MD  cyanocobalamin  1000 MCG tablet Take 1 tablet (1,000 mcg total) by mouth daily. 03/23/24   Rashid, Farhan, MD  Multiple Vitamin (MULTIVITAMIN WITH MINERALS) TABS tablet Take 1 tablet by mouth daily. 03/23/24   Rashid, Farhan, MD  omeprazole  (PRILOSEC ) 20 MG capsule Take 1 capsule (20 mg total) by mouth daily. 12/10/23   [name], MD  omeprazole  (PRILOSEC ) 20 MG capsule Take 20 mg by mouth daily. 12/10/23   [provider]    Allergies: Tamsulosin  hcl    Review of Systems  Updated Vital Signs BP 130/84 (BP Location: Right Arm)   Pulse (!) 106   Temp 99.7 F (37.6 C) (Rectal)   Resp 17   Ht 6' 2 (1.88 m)   Wt 118 kg  SpO2 97%   BMI 33.40 kg/m   Physical Exam Vitals and nursing note reviewed.  Constitutional:      General: He is not in acute distress.    Appearance: He is well-developed.  HENT:     Head: Normocephalic.     Comments: Repaired laceration over the left eyebrow and now almost identical laceration and a Y shaped pattern over the right eyebrow approximately 3 and half centimeters.  Various areas of ecchymosis scattered over the face in various stages of healing    Nose:     Comments: Swelling of the bridge of the nose with a 0.5 cm laceration.  No epistaxis    Mouth/Throat:     Mouth: Mucous membranes are dry.  Eyes:     Extraocular Movements: Extraocular movements intact.     Conjunctiva/sclera: Conjunctivae normal.      Pupils: Pupils are equal, round, and reactive to light.  Cardiovascular:     Rate and Rhythm: Normal rate and regular rhythm.     Pulses: Normal pulses.     Heart sounds: No murmur heard. Pulmonary:     Effort: Pulmonary effort is normal. No respiratory distress.     Breath sounds: Normal breath sounds. No wheezing or rales.  Abdominal:     General: There is no distension.     Palpations: Abdomen is soft.     Tenderness: There is no abdominal tenderness. There is no guarding or rebound.  Genitourinary:    Comments: Foley catheter in place draining amber-colored urine Musculoskeletal:        General: Tenderness present. Normal range of motion.     Cervical back: Normal range of motion and neck supple.     Left knee: Swelling and ecchymosis present. Tenderness present over the medial joint line and lateral joint line.     Right lower leg: No edema.     Left lower leg: No edema.  Skin:    General: Skin is warm and dry.     Findings: No erythema or rash.  Neurological:     Mental Status: He is alert and oriented to person, place, and time.     Comments: Patient has resting tremor and occasional twitching and jerking of his legs however with intention he is able to lift both legs with normal strength as well as his arms.  He has no notable facial droop but does have some swelling over the left eye and some difficulty opening the left eyelid.  His extraocular movements are intact.  Patient does have mild slurred speech but no aphasia.  He reports his speech has been like this for a while but his mouth is also very dry  Psychiatric:        Behavior: Behavior normal.     (all labs ordered are listed, but only abnormal results are displayed) Labs Reviewed  COMPREHENSIVE METABOLIC PANEL WITH GFR - Abnormal; Notable for the following components:      Result Value   CO2 19 (*)    Glucose, Bld 122 (*)    Calcium 8.7 (*)    Total Protein 5.9 (*)    Albumin 3.0 (*)    All other components  within normal limits  CBC WITH DIFFERENTIAL/PLATELET - Abnormal; Notable for the following components:   RBC 3.27 (*)    Hemoglobin 8.9 (*)    HCT 28.7 (*)    All other components within normal limits  MAGNESIUM  - Abnormal; Notable for the following components:   Magnesium  1.6 (*)  All other components within normal limits  CULTURE, BLOOD (ROUTINE X 2)  CULTURE, BLOOD (ROUTINE X 2)    EKG: None  Radiology: CT Head Wo Contrast Result Date: 03/24/2024 CLINICAL DATA:  Provided history: Head trauma, coagulopathy. EXAM: CT HEAD WITHOUT CONTRAST CT MAXILLOFACIAL WITHOUT CONTRAST CT CERVICAL SPINE WITHOUT CONTRAST TECHNIQUE: Multidetector CT imaging of the head, cervical spine, and maxillofacial structures were performed using the standard protocol without intravenous contrast. Multiplanar CT image reconstructions of the cervical spine and maxillofacial structures were also generated. RADIATION DOSE REDUCTION: This exam was performed according to the departmental dose-optimization program which includes automated exposure control, adjustment of the mA and/or kV according to patient size and/or use of iterative reconstruction technique. COMPARISON:  Head CT 03/23/2024. Maxillofacial CT 03/23/2024. Cervical spine CT 03/23/2024. FINDINGS: CT HEAD FINDINGS Mildly motion degraded examination. Within this limitation, findings are as follows. Brain: Generalized cerebral atrophy. Chronic lacunar infarct again demonstrated within the left basal ganglia region. Patchy and ill-defined hypoattenuation within the cerebral white matter, nonspecific but compatible with mild chronic small vessel ischemic disease. There is no acute intracranial hemorrhage. No demarcated cortical infarct. No extra-axial fluid collection. No evidence of an intracranial mass. No midline shift. Vascular: No hyperdense vessel.  Atherosclerotic calcifications. Skull: No calvarial fracture or aggressive osseous lesion. Other: Right forehead  hematoma and laceration. CT MAXILLOFACIAL FINDINGS Moderately motion degraded examination. Within this limitation, findings are as follows. Osseous: Known acute, displaced fractures of the anterior and posterolateral walls of the right maxillary sinus. Known acute, nondisplaced fractures of the medial wall the right orbit and right inferior orbital rim, better appreciated on the prior maxillofacial CT examinations of 03/23/2024 and 03/10/2024. Within the limitations of motion degradation, no interval acute maxillofacial fracture is identified. Orbits: The globes are normal in size and contour. No evidence of retrobulbar hematoma. Sinuses: Near complete opacification of the right maxillary sinus, similar to the prior examination of 03/23/2024 and at least partly due to the presence of hemorrhage. Moderate mucosal thickening within the right sphenoid sinus. Mild mucosal thickening scattered within bilateral ethmoid air cells. Soft tissues: Right forehead laceration and hematoma. Right facial hematoma. CT CERVICAL SPINE FINDINGS Alignment: Levocurvature of the cervical spine. Nonspecific reversal of the expected cervical lordosis. No significant spondylolisthesis. Skull base and vertebrae: The basion-dental and atlanto-dental intervals are maintained.No evidence of acute fracture to the cervical spine. C5-C6 vertebral ankylosis. Unchanged mild anterior wedge deformity of the T1 vertebral body. Soft tissues and spinal canal: No prevertebral fluid or swelling. No visible canal hematoma. Disc levels: Diffuse cervical spondylosis. 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 Multilevel bony neural foraminal narrowing. Multilevel facet arthropathy. Prominent ventral osteophytes at C3-C4, C4-C5, C5-C6, C6-C7 and C7-T1. Upper chest: No consolidation within the imaged lung apices. No visible pneumothorax. IMPRESSION: CT head:  1. Mildly motion degraded exam. 2. No evidence of an acute intracranial abnormality. 3. Right forehead hematoma and laceration. 4. Cerebral atrophy and cerebral white matter chronic small vessel ischemic disease. 5. Redemonstrated chronic lacunar infarct in the left basal ganglia region. CT maxillofacial: 1. Moderately motion degraded examination. 2. Known acute fractures of the right maxillary sinus, medial right orbital wall and right inferior orbital rim. 3. Within the limitations of motion degradation, no interval acute maxillofacial fracture is identified. 4. Persistent near complete opacification of the right maxillary sinus (at least partly due to the presence of hemorrhage). 5. Mucosal thickening within the right sphenoid and  bilateral ethmoid sinuses. 6. Right forehead hematoma and laceration. 7. Right facial hematoma. CT cervical spine: 1. No evidence of an acute cervical spine fracture. 2. Unchanged mild anterior wedge deformity of the T1 vertebral body. 3. Levocurvature of the cervical spine. 4. Nonspecific reversal of the expected cervical lordosis. 5. Cervical spondylosis as described within body of the report. 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 6. C5-C6 vertebral ankylosis. Electronically Signed   By: Rockey Childs D.O.   On: 03/24/2024 08:48   CT Cervical Spine Wo Contrast Result Date: 03/24/2024 CLINICAL DATA:  Provided history: Head trauma, coagulopathy. EXAM: CT HEAD WITHOUT CONTRAST CT MAXILLOFACIAL WITHOUT CONTRAST CT CERVICAL SPINE WITHOUT CONTRAST TECHNIQUE: Multidetector CT imaging of the head, cervical spine, and maxillofacial structures were performed using the standard protocol without intravenous contrast. Multiplanar CT image reconstructions of the cervical spine and maxillofacial structures were also generated. RADIATION DOSE REDUCTION: This exam was performed according to the  departmental dose-optimization program which includes automated exposure control, adjustment of the mA and/or kV according to patient size and/or use of iterative reconstruction technique. COMPARISON:  Head CT 03/23/2024. Maxillofacial CT 03/23/2024. Cervical spine CT 03/23/2024. FINDINGS: CT HEAD FINDINGS Mildly motion degraded examination. Within this limitation, findings are as follows. Brain: Generalized cerebral atrophy. Chronic lacunar infarct again demonstrated within the left basal ganglia region. Patchy and ill-defined hypoattenuation within the cerebral white matter, nonspecific but compatible with mild chronic small vessel ischemic disease. There is no acute intracranial hemorrhage. No demarcated cortical infarct. No extra-axial fluid collection. No evidence of an intracranial mass. No midline shift. Vascular: No hyperdense vessel.  Atherosclerotic calcifications. Skull: No calvarial fracture or aggressive osseous lesion. Other: Right forehead hematoma and laceration. CT MAXILLOFACIAL FINDINGS Moderately motion degraded examination. Within this limitation, findings are as follows. Osseous: Known acute, displaced fractures of the anterior and posterolateral walls of the right maxillary sinus. Known acute, nondisplaced fractures of the medial wall the right orbit and right inferior orbital rim, better appreciated on the prior maxillofacial CT examinations of 03/23/2024 and 03/10/2024. Within the limitations of motion degradation, no interval acute maxillofacial fracture is identified. Orbits: The globes are normal in size and contour. No evidence of retrobulbar hematoma. Sinuses: Near complete opacification of the right maxillary sinus, similar to the prior examination of 03/23/2024 and at least partly due to the presence of hemorrhage. Moderate mucosal thickening within the right sphenoid sinus. Mild mucosal thickening scattered within bilateral ethmoid air cells. Soft tissues: Right forehead laceration  and hematoma. Right facial hematoma. CT CERVICAL SPINE FINDINGS Alignment: Levocurvature of the cervical spine. Nonspecific reversal of the expected cervical lordosis. No significant spondylolisthesis. Skull base and vertebrae: The basion-dental and atlanto-dental intervals are maintained.No evidence of acute fracture to the cervical spine. C5-C6 vertebral ankylosis. Unchanged mild anterior wedge deformity of the T1 vertebral body. Soft tissues and spinal canal: No prevertebral fluid or swelling. No visible canal hematoma. Disc levels: Diffuse cervical spondylosis. 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 Multilevel bony neural foraminal narrowing. Multilevel facet arthropathy. Prominent ventral osteophytes at C3-C4, C4-C5, C5-C6, C6-C7 and C7-T1. Upper chest: No consolidation within the imaged lung apices. No visible pneumothorax. IMPRESSION: CT head: 1. Mildly motion degraded exam. 2. No evidence of an acute intracranial abnormality. 3. Right forehead hematoma and laceration. 4. Cerebral atrophy and cerebral white matter chronic small vessel ischemic disease. 5.  Redemonstrated chronic lacunar infarct in the left basal ganglia region. CT maxillofacial: 1. Moderately motion degraded examination. 2. Known acute fractures of the right maxillary sinus, medial right orbital wall and right inferior orbital rim. 3. Within the limitations of motion degradation, no interval acute maxillofacial fracture is identified. 4. Persistent near complete opacification of the right maxillary sinus (at least partly due to the presence of hemorrhage). 5. Mucosal thickening within the right sphenoid and bilateral ethmoid sinuses. 6. Right forehead hematoma and laceration. 7. Right facial hematoma. CT cervical spine: 1. No evidence of an acute cervical spine fracture. 2. Unchanged mild anterior wedge deformity of the T1 vertebral body.  3. Levocurvature of the cervical spine. 4. Nonspecific reversal of the expected cervical lordosis. 5. Cervical spondylosis as described within body of the report. 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 6. C5-C6 vertebral ankylosis. Electronically Signed   By: Rockey Childs D.O.   On: 03/24/2024 08:48   CT Maxillofacial Wo Contrast Result Date: 03/24/2024 CLINICAL DATA:  Provided history: Head trauma, coagulopathy. EXAM: CT HEAD WITHOUT CONTRAST CT MAXILLOFACIAL WITHOUT CONTRAST CT CERVICAL SPINE WITHOUT CONTRAST TECHNIQUE: Multidetector CT imaging of the head, cervical spine, and maxillofacial structures were performed using the standard protocol without intravenous contrast. Multiplanar CT image reconstructions of the cervical spine and maxillofacial structures were also generated. RADIATION DOSE REDUCTION: This exam was performed according to the departmental dose-optimization program which includes automated exposure control, adjustment of the mA and/or kV according to patient size and/or use of iterative reconstruction technique. COMPARISON:  Head CT 03/23/2024. Maxillofacial CT 03/23/2024. Cervical spine CT 03/23/2024. FINDINGS: CT HEAD FINDINGS Mildly motion degraded examination. Within this limitation, findings are as follows. Brain: Generalized cerebral atrophy. Chronic lacunar infarct again demonstrated within the left basal ganglia region. Patchy and ill-defined hypoattenuation within the cerebral white matter, nonspecific but compatible with mild chronic small vessel ischemic disease. There is no acute intracranial hemorrhage. No demarcated cortical infarct. No extra-axial fluid collection. No evidence of an intracranial mass. No midline shift. Vascular: No hyperdense vessel.  Atherosclerotic calcifications. Skull: No calvarial fracture or aggressive osseous lesion. Other: Right forehead hematoma  and laceration. CT MAXILLOFACIAL FINDINGS Moderately motion degraded examination. Within this limitation, findings are as follows. Osseous: Known acute, displaced fractures of the anterior and posterolateral walls of the right maxillary sinus. Known acute, nondisplaced fractures of the medial wall the right orbit and right inferior orbital rim, better appreciated on the prior maxillofacial CT examinations of 03/23/2024 and 03/10/2024. Within the limitations of motion degradation, no interval acute maxillofacial fracture is identified. Orbits: The globes are normal in size and contour. No evidence of retrobulbar hematoma. Sinuses: Near complete opacification of the right maxillary sinus, similar to the prior examination of 03/23/2024 and at least partly due to the presence of hemorrhage. Moderate mucosal thickening within the right sphenoid sinus. Mild mucosal thickening scattered within bilateral ethmoid air cells. Soft tissues: Right forehead laceration and hematoma. Right facial hematoma. CT CERVICAL SPINE FINDINGS Alignment: Levocurvature of the cervical spine. Nonspecific reversal of the expected cervical lordosis. No significant spondylolisthesis. Skull base and vertebrae: The basion-dental and atlanto-dental intervals are maintained.No evidence of acute fracture to the cervical spine. C5-C6 vertebral ankylosis. Unchanged mild anterior wedge deformity of the T1 vertebral body. Soft tissues and spinal canal: No prevertebral fluid or swelling. No visible canal hematoma. Disc levels: Diffuse cervical spondylosis. At C3-C4, a posterior disc osteophyte complex and ossification of  the posterior longitudinal ligament contribute to severe spinal canal stenosis. Multilevel bony neural foraminal narrowing. Multilevel facet arthropathy. Prominent ventral osteophytes at C3-C4, C4-C5, C5-C6, C6-C7 and C7-T1. Upper chest: No consolidation within the imaged lung apices. No visible pneumothorax. IMPRESSION: CT head: 1. Mildly  motion degraded exam. 2. No evidence of an acute intracranial abnormality. 3. Right forehead hematoma and laceration. 4. Cerebral atrophy and cerebral white matter chronic small vessel ischemic disease. 5. Redemonstrated chronic lacunar infarct in the left basal ganglia region. CT maxillofacial: 1. Moderately motion degraded examination. 2. Known acute fractures of the right maxillary sinus, medial right orbital wall and right inferior orbital rim. 3. Within the limitations of motion degradation, no interval acute maxillofacial fracture is identified. 4. Persistent near complete opacification of the right maxillary sinus (at least partly due to the presence of hemorrhage). 5. Mucosal thickening within the right sphenoid and bilateral ethmoid sinuses. 6. Right forehead hematoma and laceration. 7. Right facial hematoma. CT cervical spine: 1. No evidence of an acute cervical spine fracture. 2. Unchanged mild anterior wedge deformity of the T1 vertebral body. 3. Levocurvature of the cervical spine. 4. Nonspecific reversal of the expected cervical lordosis. 5. Cervical spondylosis as described within body of the report. 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 6. C5-C6 vertebral ankylosis. Electronically Signed   By: Rockey Childs D.O.   On: 03/24/2024 08:48   DG Knee Complete 4 Views Left Result Date: 03/24/2024 CLINICAL DATA:  pain after fall EXAM: LEFT KNEE - COMPLETE 4+ VIEW COMPARISON:  February 16, 2024 FINDINGS: No acute fracture or dislocation. Heterotopic bone formation along the fibular head. No joint effusion. There is no evidence of arthropathy or other focal bone abnormality. Soft tissues are unremarkable. IMPRESSION: No acute fracture or dislocation. Electronically Signed   By: Rogelia Myers M.D.   On: 03/24/2024 08:39   CT Cervical Spine Wo Contrast Result Date: 03/23/2024 CLINICAL DATA:  Trauma fall  EXAM: CT CERVICAL SPINE WITHOUT CONTRAST TECHNIQUE: Multidetector CT imaging of the cervical spine was performed without intravenous contrast. Multiplanar CT image reconstructions were also generated. RADIATION DOSE REDUCTION: This exam was performed according to the departmental dose-optimization program which includes automated exposure control, adjustment of the mA and/or kV according to patient size and/or use of iterative reconstruction technique. COMPARISON:  CT 03/10/2024, 03/16/2024 FINDINGS: Alignment: Straightening of the cervical spine. No subluxation. Facet alignment is normal Skull base and vertebrae: No acute fracture. No primary bone lesion or focal pathologic process. Soft tissues and spinal canal: No prevertebral fluid or swelling. No visible canal hematoma. Disc levels: Diffuse degenerative changes with diffuse disc space narrowing, moderate severe narrowing at C5-C6, C6-C7 and C7-T1. 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 Multilevel facet degenerative changes. Severe foraminal narrowing at C3-C4. Bulky anterior osteophytes. Upper chest: Negative. Other: None IMPRESSION: 1. Straightening of the cervical spine with advanced degenerative changes. No acute osseous abnormality. 2. 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 Electronically Signed   By: Luke Bun M.D.   On: 03/23/2024 16:45   CT MAXILLOFACIAL WO CONTRAST Result Date: 03/23/2024 CLINICAL DATA:  Unwitnessed fall EXAM: CT MAXILLOFACIAL WITHOUT CONTRAST TECHNIQUE: Multidetector CT imaging of the maxillofacial structures was performed. Multiplanar CT image reconstructions were also generated. RADIATION DOSE REDUCTION: This exam was performed according to the departmental dose-optimization program which includes automated  exposure control, adjustment of the mA and/or kV according to patient size and/or use of  iterative reconstruction technique. COMPARISON:  Facial CT 03/10/2024 FINDINGS: Osseous: Mastoid air cells are clear. Mandibular heads are normally positioned. Heterogeneous sclerosis in the right mastoid. Pterygoid plates and zygomatic arches are intact. No acute nasal bone fracture Orbits: Redemonstrated nondisplaced fractures of the medial right orbital wall and inferior right orbital rim. Globes appear intact. No intraorbital soft tissue swelling. Chronic fracture deformity posterior floor of right orbit. Sinuses: Interval opacification of right maxillary sinus. Redemonstrated fractures of the anterior and posterolateral walls of the right maxillary sinus. Soft tissues: Residual right cheek hematoma. Deep laceration left forehead with small right forehead hematoma Limited intracranial: See separately dictated head CT IMPRESSION: 1. Redemonstrated fractures involving the right maxillary sinus and orbit, stable since 03/10/2024. No new fracture abnormality is seen. 2. Residual right cheek hematoma. Deep laceration left forehead with small right forehead scalp hematoma Electronically Signed   By: Luke Bun M.D.   On: 03/23/2024 16:37   DG Pelvis 1-2 Views Result Date: 03/23/2024 CLINICAL DATA:  Trauma fall EXAM: PELVIS - 1-2 VIEW COMPARISON:  03/16/2024 FINDINGS: SI joints are non widened. Pubic symphysis and rami appear intact. Limited assessment of femoral necks due to positioning and superimposition of trochanters. IMPRESSION: Limited assessment of femoral necks due to positioning. If there is high clinical concern for hip fracture, dedicated hip radiographs versus CT follow-up. Otherwise no acute osseous abnormality is seen Electronically Signed   By: Luke Bun M.D.   On: 03/23/2024 16:28   DG Chest 1 View Result Date: 03/23/2024 CLINICAL DATA:  Fall trauma EXAM: CHEST  1 VIEW COMPARISON:  03/16/2024 FINDINGS: Hypoventilatory changes. No acute airspace disease or effusion. Stable cardiomediastinal  silhouette. No pneumothorax IMPRESSION: No active disease. Hypoventilatory changes. Electronically Signed   By: Luke Bun M.D.   On: 03/23/2024 16:27   DG Tibia/Fibula Left Result Date: 03/23/2024 CLINICAL DATA:  Fall EXAM: LEFT TIBIA AND FIBULA - 2 VIEW COMPARISON:  CT knee 02/16/2024 FINDINGS: No fracture or malalignment. Heterotopic ossification surrounding the proximal fibula with ankylosis of the proximal tibiofibular joint as seen on prior CT. Soft tissues are unremarkable IMPRESSION: No acute osseous abnormality. Electronically Signed   By: Luke Bun M.D.   On: 03/23/2024 16:26   CT Head Wo Contrast Result Date: 03/23/2024 CLINICAL DATA:  Unwitnessed fall laceration to left eyebrow trauma EXAM: CT HEAD WITHOUT CONTRAST TECHNIQUE: Contiguous axial images were obtained from the base of the skull through the vertex without intravenous contrast. RADIATION DOSE REDUCTION: This exam was performed according to the departmental dose-optimization program which includes automated exposure control, adjustment of the mA and/or kV according to patient size and/or use of iterative reconstruction technique. COMPARISON:  CT brain 03/10/2024, 03/16/2024 FINDINGS: Brain: No acute territorial infarction, hemorrhage or intracranial mass. Moderate atrophy. Mild chronic small vessel ischemic changes of the white matter. Stable ventricle size. Vascular: No hyperdense vessels.  Carotid vascular calcification. Skull: No depressed skull fracture. Sinuses/Orbits: See separately dictated facial CT. Other: Laceration and hematoma to the left greater than right forehead. IMPRESSION: 1. No definite CT evidence for acute intracranial abnormality. 2. Atrophy and chronic small vessel ischemic changes of the white matter. 3. Laceration and hematoma to the left greater than right forehead. Electronically Signed   By: Luke Bun M.D.   On: 03/23/2024 16:24     Procedures  LACERATION REPAIR Performed by: [employer] Authorized by: Benton Shone Consent: Verbal consent obtained.  Risks and benefits: risks, benefits and alternatives were discussed Consent given by: patient Patient identity confirmed: provided demographic data Prepped and Draped in normal sterile fashion Wound explored  Laceration Location: right forehead  Laceration Length: 3.5cm  No Foreign Bodies seen or palpated  Anesthesia: local infiltration  Local anesthetic: lidocaine  2% with epinephrine   Anesthetic total: 5 ml  Irrigation method: syringe Amount of cleaning: standard  Skin closure: 5.0 vicryl rapide and 6.0 prolene  Number of sutures: 2 deep sutures and 12 superficial  Technique: simple interrupted  Patient tolerance: Patient tolerated the procedure well with no immediate complications.   Medications Ordered in the ED  lactated ringers  infusion (has no administration in time range)  lidocaine -EPINEPHrine  (XYLOCAINE  W/EPI) 2 %-1:200000 (PF) injection 10 mL (10 mLs Infiltration Given 03/24/24 0826)  acetaminophen  (TYLENOL ) tablet 1,000 mg (1,000 mg Oral Given 03/24/24 0823)  carbidopa -levodopa  (SINEMET  CR) 50-200 MG per tablet controlled release 1 tablet (1 tablet Oral Given 03/24/24 1005)  LORazepam  (ATIVAN ) injection 0.5 mg (0.5 mg Intravenous Given 03/24/24 1112)                                    Medical Decision Making Amount and/or Complexity of Data Reviewed Independent Historian: EMS External Data Reviewed: notes. Labs: ordered. Decision-making details documented in ED Course. Radiology: ordered and independent interpretation performed. Decision-making details documented in ED Course. ECG/medicine tests: ordered and independent interpretation performed. Decision-making details documented in ED Course.  Risk OTC drugs. Prescription drug management. Decision regarding hospitalization.   Pt with multiple medical problems and comorbidities and presenting today with a complaint that caries a  high risk for morbidity and mortality.  Returning today for recurrent falls.  Patient now has trauma to the right eyebrow.  He is on Eliquis  due to recent PEs at the end of June.  Tetanus shot is up-to-date.  Patient's mental status seems to be at baseline he is able to have full conversation and seems lucid.  No focal deficits noted except for some mild slurred speech however may be result of the severe dry mouth.  Will do a swallow test and see if that improves patient's speech.  No signs of bite marks to the tongue and during his last hospitalization patient was ruled out for seizure.  Patient when he was seen yesterday showed hypokalemia and hypomagnesemia which was replaced but also concern for a UTI and urinary retention.  Patient currently has a Foley in place.  Patient does have mild tachycardia however that has seemed to be present since having bilateral PEs.  He had a echo done at the end of June that showed no significant heart strain.  Will repeat labs today to ensure no recurrent electrolyte issues.  New scan of the head and face due to new injury.  11:15 AM Unfortunately after patient was given fluid to drink which he did pass the swallow test his speech did not improve.  The longer patient is in the emergency room the more delirious he becomes.  He can be redirected but does not appear to be his baseline.  Could be from recurrent falls and head injury.  Also could be exacerbation of his parkinsonism as he has not had any of his medication this morning.  I independently interpreted patient's labs and CBC with persistent anemia with a hemoglobin of 8.9 which is not significantly changed, normal white count, CMP with normal creatinine and electrolytes, chest  x-ray from yesterday without signs of pneumonia.  I have independently visualized and interpreted pt's images today. Imaging of the knee today without signs of fracture and on repeat evaluation patient is able to fully extend and flex.  Head CT  without evidence of acute bleed today.  Radiology reports that there is chronic changes but no acute findings and no acute facial fractures. 11:15 AM Despite pain patient's Sinemet  he is still delirious with intermittent hallucinations.  He attempted to get out of bed and fell also pulling his Foley catheter out.  Continues to have slurred speech.  Feel that patient needs admission for encephalopathy.  Was given some medication for agitation however due to prolonged QT could not get Haldol.  At this time patient cannot undergo MRI as he is not able to sit still.  Will consult hospitalist for admission.    CRITICAL CARE Performed by: Faye Sanfilippo Total critical care time: 30 minutes Critical care time was exclusive of separately billable procedures and treating other patients. Critical care was necessary to treat or prevent imminent or life-threatening deterioration. Critical care was time spent personally by me on the following activities: development of treatment plan with patient and/or surrogate as well as nursing, discussions with consultants, evaluation of patient's response to treatment, examination of patient, obtaining history from patient or surrogate, ordering and performing treatments and interventions, ordering and review of laboratory studies, ordering and review of radiographic studies, pulse oximetry and re-evaluation of patient's condition.   Final diagnoses:  Fall, initial encounter  Facial laceration, initial encounter  Injury of head, initial encounter  Encephalopathy acute  Acute cystitis with hematuria  Slurred speech    ED Discharge Orders     None          Doretha Folks, MD 03/24/24 1115    Doretha Folks, MD 03/24/24 1140

## 2024-03-25 ENCOUNTER — Inpatient Hospital Stay (HOSPITAL_COMMUNITY)

## 2024-03-25 ENCOUNTER — Encounter (HOSPITAL_COMMUNITY): Payer: Self-pay | Admitting: Internal Medicine

## 2024-03-25 DIAGNOSIS — G20A1 Parkinson's disease without dyskinesia, without mention of fluctuations: Secondary | ICD-10-CM | POA: Diagnosis not present

## 2024-03-25 DIAGNOSIS — R296 Repeated falls: Secondary | ICD-10-CM | POA: Diagnosis not present

## 2024-03-25 DIAGNOSIS — R4182 Altered mental status, unspecified: Secondary | ICD-10-CM | POA: Diagnosis not present

## 2024-03-25 DIAGNOSIS — R569 Unspecified convulsions: Secondary | ICD-10-CM | POA: Diagnosis not present

## 2024-03-25 LAB — PHOSPHORUS: Phosphorus: 2.4 mg/dL — ABNORMAL LOW (ref 2.5–4.6)

## 2024-03-25 LAB — CBC
HCT: 26.3 % — ABNORMAL LOW (ref 39.0–52.0)
Hemoglobin: 8.2 g/dL — ABNORMAL LOW (ref 13.0–17.0)
MCH: 27 pg (ref 26.0–34.0)
MCHC: 31.2 g/dL (ref 30.0–36.0)
MCV: 86.5 fL (ref 80.0–100.0)
Platelets: 277 K/uL (ref 150–400)
RBC: 3.04 MIL/uL — ABNORMAL LOW (ref 4.22–5.81)
RDW: 15.2 % (ref 11.5–15.5)
WBC: 7.1 K/uL (ref 4.0–10.5)
nRBC: 0 % (ref 0.0–0.2)

## 2024-03-25 LAB — COPPER, SERUM: Copper: 135 ug/dL — ABNORMAL HIGH (ref 69–132)

## 2024-03-25 LAB — ZINC: Zinc: 57 ug/dL (ref 44–115)

## 2024-03-25 LAB — BASIC METABOLIC PANEL WITH GFR
Anion gap: 8 (ref 5–15)
BUN: 10 mg/dL (ref 8–23)
CO2: 23 mmol/L (ref 22–32)
Calcium: 8.6 mg/dL — ABNORMAL LOW (ref 8.9–10.3)
Chloride: 111 mmol/L (ref 98–111)
Creatinine, Ser: 1.4 mg/dL — ABNORMAL HIGH (ref 0.61–1.24)
GFR, Estimated: 56 mL/min — ABNORMAL LOW (ref >60)
Glucose, Bld: 92 mg/dL (ref 70–99)
Potassium: 3.5 mmol/L (ref 3.5–5.1)
Sodium: 142 mmol/L (ref 135–145)

## 2024-03-25 LAB — MAGNESIUM: Magnesium: 1.7 mg/dL (ref 1.7–2.4)

## 2024-03-25 MED ORDER — THIAMINE HCL 100 MG/ML IJ SOLN
100.0000 mg | Freq: Every day | INTRAMUSCULAR | Status: DC
  Filled 2024-03-25: qty 2

## 2024-03-25 MED ORDER — MAGNESIUM SULFATE IN D5W 1-5 GM/100ML-% IV SOLN
1.0000 g | Freq: Once | INTRAVENOUS | Status: AC
  Administered 2024-03-25: 1 g via INTRAVENOUS
  Filled 2024-03-25: qty 100

## 2024-03-25 MED ORDER — APIXABAN 5 MG PO TABS
5.0000 mg | ORAL_TABLET | Freq: Two times a day (BID) | ORAL | Status: DC
  Administered 2024-03-25 – 2024-03-29 (×9): 5 mg via ORAL
  Filled 2024-03-25 (×9): qty 1

## 2024-03-25 MED ORDER — THIAMINE HCL 100 MG/ML IJ SOLN
500.0000 mg | Freq: Three times a day (TID) | INTRAVENOUS | Status: AC
  Administered 2024-03-25 – 2024-03-27 (×6): 500 mg via INTRAVENOUS
  Filled 2024-03-25 (×7): qty 5

## 2024-03-25 MED ORDER — CYANOCOBALAMIN 1000 MCG/ML IJ SOLN
1000.0000 ug | INTRAMUSCULAR | Status: DC
  Administered 2024-03-25: 1000 ug via SUBCUTANEOUS
  Filled 2024-03-25: qty 1

## 2024-03-25 MED ORDER — K PHOS MONO-SOD PHOS DI & MONO 155-852-130 MG PO TABS
500.0000 mg | ORAL_TABLET | Freq: Three times a day (TID) | ORAL | Status: AC
  Administered 2024-03-25 – 2024-03-26 (×3): 500 mg via ORAL
  Filled 2024-03-25 (×3): qty 2

## 2024-03-25 MED ORDER — THIAMINE HCL 100 MG/ML IJ SOLN
250.0000 mg | Freq: Every day | INTRAVENOUS | Status: AC
  Administered 2024-03-28 – 2024-04-02 (×6): 250 mg via INTRAVENOUS
  Filled 2024-03-25 (×6): qty 2.5

## 2024-03-25 NOTE — TOC CAGE-AID Note (Signed)
 Transition of Care [facility]) - CAGE-AID Screening   Patient Details  Name: John [name] MRN: [id number] Date of Birth: 14-Jun-1961  [facility]) CM/SW Contact:    [name], LCSW Phone Number: 03/25/2024, 11:31 AM   Clinical Narrative: Patient disoriented and unable to participate in screening at this time.    CAGE-AID Screening: Substance Abuse Screening unable to be completed due to: : Patient unable to participate

## 2024-03-25 NOTE — Progress Notes (Addendum)
 NEUROLOGY CONSULT FOLLOW UP NOTE   Date of service: March 25, 2024 Patient Name: John [name] MRN:  [id number] DOB:  1961-06-28  Interval Hx/subjective   Sleeping on my arrival to the room. Opens eyes to voice and is able to tell me that he is in the hospital because he fell, but needs repeated stimulation to stay awake. Follows commands. No jerking movements noted on my exam.  Vitals   Vitals:   03/25/24 0100 03/25/24 0300 03/25/24 0324 03/25/24 0400  BP:   (!) 140/76 139/74  Pulse:   (!) 115 (!) 108  Resp:   (!) 29 (!) 22  Temp: (!) 101.2 F (38.4 C) 100 F (37.8 C)    TempSrc: Axillary Axillary    SpO2:   95% 99%  Weight:      Height:         Body mass index is 33.4 kg/m.  Physical Exam   Constitutional: Appears well-developed and well-nourished.  Psych: Calm Eyes: No scleral injection.  HENT: No OP obstruction.  Head: Multiple areas of contusions, 2 lacerations on the front of his face.  Tender to touch of the left eyebrow in particular Cardiovascular: Tachycardic Respiratory: No distress, regular unlabored, now on 2L while sleeping  GI: Soft.  No distension. There is no tenderness.  Skin: WDI.   Neurologic Examination   Neuro: Mental Status: Patient is sleeping, but opens eyes when asked. Right eye opens more than left.  On attending examination was able to answer his age, the month, and that he was in the hospital, and that he had had multiple recent falls although hazy on any further details Cranial Nerves: II: PERRL III,IV, VI: EOMI without ptosis or diploplia.  V: Facial sensation is symmetric to temperature VII: Facial movement is symmetric resting and smiling VIII: Hearing is intact to voice X: Palate elevates symmetrically XI: Shoulder shrug is symmetric. XII: Tongue protrudes midline without atrophy or fasciculations.  Motor: Tone is normal. Bulk is normal. Elevates all extremities antigravity  Sensory: Sensation is symmetric to light touch in  the arms and legs. No extinction to DSS present.  Cerebellar: Unable to complete  Reflexes: 3+ bilateral brachioradialis and 3+ bilateral patellae  Medications  Current Facility-Administered Medications:    acetaminophen  (TYLENOL ) tablet 650 mg, 650 mg, Oral, Q6H PRN **OR** acetaminophen  (TYLENOL ) suppository 650 mg, 650 mg, Rectal, Q6H PRN, Claudene Reeves A, MD, 650 mg at 03/24/24 2329   albuterol  (PROVENTIL ) (2.5 MG/3ML) 0.083% nebulizer solution 2.5 mg, 2.5 mg, Nebulization, Q6H PRN, Claudene, Rondell A, MD   carbidopa -levodopa  (SINEMET  CR) 50-200 MG per tablet controlled release 1 tablet, 1 tablet, Oral, 6 times per day, Claudene Reeves A, MD   cefTRIAXone  (ROCEPHIN ) 1 g in sodium chloride  0.9 % 100 mL IVPB, 1 g, Intravenous, Q24H, Smith, Rondell A, MD, Stopped at 03/24/24 2033   Chlorhexidine  Gluconate Cloth 2 % PADS 6 each, 6 each, Topical, Daily, Claudene Reeves A, MD, 6 each at 03/24/24 1720   cyanocobalamin  (VITAMIN B12) injection 1,000 mcg, 1,000 mcg, Subcutaneous, Q30 days, Elgergawy, Brayton RAMAN, MD   lactated ringers  infusion, , Intravenous, Continuous, Plunkett, Whitney, MD, Last Rate: 125 mL/hr at 03/24/24 1855, New Bag at 03/24/24 1855   LORazepam  (ATIVAN ) injection 0.5 mg, 0.5 mg, Intravenous, Q6H PRN, Claudene, Rondell A, MD, 0.5 mg at 03/24/24 2222   ondansetron  (ZOFRAN ) tablet 4 mg, 4 mg, Oral, Q6H PRN **OR** ondansetron  (ZOFRAN ) injection 4 mg, 4 mg, Intravenous, Q6H PRN, Claudene Reeves LABOR, MD  sodium chloride  flush (NS) 0.9 % injection 3 mL, 3 mL, Intravenous, Q12H, Smith, Rondell A, MD, 3 mL at 03/24/24 2222  Labs and Diagnostic Imaging   CBC:  Recent Labs  Lab 03/24/24 0707 03/25/24 0624  WBC 9.2 7.1  NEUTROABS 6.7  --   HGB 8.9* 8.2*  HCT 28.7* 26.3*  MCV 87.8 86.5  PLT 304 277    Basic Metabolic Panel:  Lab Results  Component Value Date   NA 142 03/25/2024   K 3.5 03/25/2024   CO2 23 03/25/2024   GLUCOSE 92 03/25/2024   BUN 10 03/25/2024   CREATININE 1.40 (H)  03/25/2024   CALCIUM 8.6 (L) 03/25/2024   GFRNONAA 56 (L) 03/25/2024   GFRAA 88 03/13/2020   Lipid Panel:  Lab Results  Component Value Date   LDLCALC 73 12/10/2023   Alcohol Level     Component Value Date/Time   ETH <15 03/16/2024 2143   INR  Lab Results  Component Value Date   INR 1.3 (H) 03/23/2024   Folate - 10.6 Copper  B6 B3 B1 Vitamin E MMA  B12- 206  CT Head without contrast(Personally reviewed): No acute intracranial abnormality. Patchy chronic small vessel disease throughout the deep white matter.  CT C- spine: No acute bony abnormality. Cervical spondylosis with up to moderate to severe stenosis at C3/4   rEEG: 7/9  IMPRESSION: This study is within normal limits. No seizures or epileptiform discharges were seen throughout the recording. A normal interictal EEG does not exclude the diagnosis of epilepsy.    Assessment   John [name] is a 63 y.o. male with a past medical history of parkinson's disease, HTN, recent PE now on eliquis  presenting after multiple falls and altered mental status.  Significant gradual decline consistent with progressive Parkinson's disease.  Unable to safely manage medications at home or maintain home in a safe living condition.  Some possible cognitive decline as well although was still holding down his job at [employer] until disability recently approved within the past 2 months or so.  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 - Potential for C-spine injury - Potential postconcussive syndrome - Possible underlying seizure, though no clear description of seizures - Delirium in the setting of pain, hospitalization, head injury, medications - Concern for underlying poor nutritional status  Recommendations  - MRI Brain w wo when mental status allows - Would also obtain MRI cervical spine  without contrast to rule acute cord compression contributing given moderate to severe canal stenosis on CT cervical spine and many falls - Low normal B12 at 206, check MMA to see if functionally deficient and in the interim start empiric supplementation for goal level greater than 600 - Follow up pending labs:  zinc , copper , Vitamin E, B1, B3, B6, MMA - Please continue carbidopa /levodopa  at his home dose - Empiric thiamine  supplementation pending B1 result (Wernicke's dosing used given high benefit, low risk) - Neurology will follow along ______________________________________________________________________   Signed, Jorene Last, NP [facility]  Attending Neurologist's note:  I personally saw this patient, gathering history, performing a full neurologic examination, reviewing relevant labs, personally reviewing relevant imaging including head CT, CT C-spine, and formulated the assessment and plan, adding the note above for completeness and clarity to accurately reflect my thoughts   Lola Jernigan MD-PhD [facility] [phone]

## 2024-03-25 NOTE — NC FL2 (Signed)
 [place]  MEDICAID FL2 LEVEL OF CARE FORM     IDENTIFICATION  Patient Name: John [name] Birthdate: March 20, 1961 Sex: male Admission Date (Current Location): 03/24/2024  [facility] and [place] Number:  Producer, television/film/video and Address:  [facility], [address], [place], [place] [address]      Provider Number: [id number]  Attending Physician Name and Address:  Elgergawy, Brayton RAMAN, MD  Relative Name and Phone Number:       Current Level of Care: Hospital Recommended Level of Care: Skilled Nursing Facility Prior Approval Number:    Date Approved/Denied:   PASRR Number: [id number]  Discharge Plan: SNF    Current Diagnoses: Patient Active Problem List   Diagnosis Date Noted   Frequent falls 03/24/2024   Acute encephalopathy 03/24/2024   Acute metabolic encephalopathy 03/24/2024   Laceration of head 03/24/2024   Urinary tract infection 03/24/2024   Hypomagnesemia 03/24/2024   History of pulmonary embolism 03/24/2024   BPH with urinary obstruction 03/24/2024   Obesity (BMI 30-39.9) 03/24/2024   Transient loss of consciousness 03/17/2024   First time seizure (HCC) 03/16/2024   AKI (acute kidney injury) (HCC) 03/16/2024   Bilateral pulmonary embolism (HCC) 03/16/2024   Normocytic anemia 03/16/2024   Parkinson disease (HCC) 03/16/2024   Lactic acidosis 03/16/2024   Acute pulmonary embolism (HCC) 03/10/2024   ARF (acute renal failure) (HCC) 03/10/2024   Hypokalemia 03/10/2024   Hyponatremia 03/10/2024   Maxillary fracture (HCC) 03/10/2024   Anemia 03/10/2024   Tinnitus of both ears 12/10/2023   Parkinson's disease with fluctuating manifestations (HCC) 12/02/2022   Routine general medical examination at a health care facility 10/29/2021   Family history of prostate cancer 10/29/2021   Essential hypertension 10/29/2021   Gastroesophageal reflux disease without esophagitis 10/29/2021   Obesity (BMI 30.0-34.9) 10/29/2021    Orientation  RESPIRATION BLADDER Height & Weight     Self  O2 (2L nasal cannula) Incontinent, Indwelling catheter Weight: 260 lb 2.3 oz (118 kg) Height:  6' 2 (188 cm)  BEHAVIORAL SYMPTOMS/MOOD NEUROLOGICAL BOWEL NUTRITION STATUS      Continent Diet (See dc summary)  AMBULATORY STATUS COMMUNICATION OF NEEDS Skin   Extensive Assist Verbally Other (Comment), PU Stage and Appropriate Care (trauma wound on face, eye, leg, Stage III on heel;)                       Personal Care Assistance Level of Assistance  Bathing, Feeding, Dressing Bathing Assistance: Maximum assistance Feeding assistance: Limited assistance Dressing Assistance: Maximum assistance     Functional Limitations Info  Sight, Hearing Sight Info: Impaired Hearing Info: Impaired      SPECIAL CARE FACTORS FREQUENCY  PT (By licensed PT), OT (By licensed OT)     PT Frequency: 5x/week OT Frequency: 5x/week            Contractures Contractures Info: Not present    Additional Factors Info  Code Status, Allergies Code Status Info: Full Allergies Info: Flomax  (Tamsulosin  Hcl)           Current Medications (03/25/2024):  This is the current hospital active medication list Current Facility-Administered Medications  Medication Dose Route Frequency Provider Last Rate Last Admin   acetaminophen  (TYLENOL ) tablet 650 mg  650 mg Oral Q6H PRN Smith, Rondell A, MD       Or   acetaminophen  (TYLENOL ) suppository 650 mg  650 mg Rectal Q6H PRN Smith, Rondell A, MD   650 mg at 03/24/24  2329   albuterol  (PROVENTIL ) (2.5 MG/3ML) 0.083% nebulizer solution 2.5 mg  2.5 mg Nebulization Q6H PRN Smith, Rondell A, MD       carbidopa -levodopa  (SINEMET  CR) 50-200 MG per tablet controlled release 1 tablet  1 tablet Oral 6 times per day Claudene Maximino LABOR, MD   1 tablet at 03/25/24 1055   cefTRIAXone  (ROCEPHIN ) 1 g in sodium chloride  0.9 % 100 mL IVPB  1 g Intravenous Q24H Smith, Rondell A, MD   Stopped at 03/24/24 2033   Chlorhexidine  Gluconate  Cloth 2 % PADS 6 each  6 each Topical Daily Smith, Rondell A, MD   6 each at 03/25/24 0905   cyanocobalamin  (VITAMIN B12) injection 1,000 mcg  1,000 mcg Subcutaneous Q30 days Elgergawy, Dawood S, MD       lactated ringers  infusion   Intravenous Continuous Doretha Folks, MD 125 mL/hr at 03/25/24 1203 New Bag at 03/25/24 1203   LORazepam  (ATIVAN ) injection 0.5 mg  0.5 mg Intravenous Q6H PRN Smith, Rondell A, MD   0.5 mg at 03/24/24 2222   ondansetron  (ZOFRAN ) tablet 4 mg  4 mg Oral Q6H PRN Smith, Rondell A, MD       Or   ondansetron  (ZOFRAN ) injection 4 mg  4 mg Intravenous Q6H PRN Smith, Rondell A, MD       sodium chloride  flush (NS) 0.9 % injection 3 mL  3 mL Intravenous Q12H Smith, Rondell A, MD   3 mL at 03/25/24 9093   thiamine  (VITAMIN B1) 500 mg in sodium chloride  0.9 % 50 mL IVPB  500 mg Intravenous Q8H Bhagat, Srishti L, MD 110 mL/hr at 03/25/24 1052 500 mg at 03/25/24 1052   Followed by   NOREEN ON 03/28/2024] thiamine  (VITAMIN B1) 250 mg in sodium chloride  0.9 % 50 mL IVPB  250 mg Intravenous Daily Bhagat, Srishti L, MD       Followed by   NOREEN ON 04/03/2024] thiamine  (VITAMIN B1) injection 100 mg  100 mg Intravenous Daily Bhagat, Srishti L, MD         Discharge Medications: Please see discharge summary for a list of discharge medications.  Relevant Imaging Results:  Relevant Lab Results:   Additional Information [id number]  Marquett Bertoli S Shaleigh Laubscher, LCSW

## 2024-03-25 NOTE — TOC Progression Note (Addendum)
 Transition of Care [facility]) - Progression Note    Patient Details  Name: John [name] MRN: [id number] Date of Birth: May 28, 1961  [facility] CM/SW Contact  Corean JAYSON Canary, RN Phone Number: 03/25/2024, 11:21 AM  Clinical Narrative:     Long conversation with sister  She has  several concerns about what is happening to her brother. She is venting about how many times he has fallen and he is not safe at [facility]. Cited no mat at bedside, [facility] stated they had to order it. He fell soon after and back to [facility].  He is ping ponging back and forth between [facility] and the hospital What is long term prognosis from MD He has fallen many times at [place], Cited many concerns with [name], not being able to take care of him keep him from falling, not have safety mats when asked. Discussed  SNF  responsibility for finding another institution. As well , mitigation of injury is primary versus falls.  Choices are he could go back there or go home, however private duty would be out of pocket , CS services could be applied for, however this takes a while.  Discussed POA, he has a son, somewhat estranged, Sister and brothers are NOK after that. Discussed medical decision making versus financial. Medical will go to [facility] ([facility] according to [place] code.  Why can't he go somewhere else? Spoke to her that the patient only has medicaid, and there are limited medicaid beds. She said he had applied for disability and finally got it, she did not know that patient has to be on disability for 2 years prior to institution of Medicare. At one point she said  so he is just out of luck because no- one can do anything and there is bureaucracy everywhere  What is his long term prognosis? No- one can tell me- discussing with Dr Sherlon , discussing other issues with CSW. Will call her back with the answers as best as we can t these questions.    Spoke to [employer] CSW,  to make sure there is nothing else  we can do, discussed that he is on managed medicaid, needs full [place] medicaid to optimize choices. For LTC he would have to turn over his check from disability and spend down assets.   1430 Talked to Darin Pao, again who is still adamant about making sure he is somewhere besides [name], as it is dangerous, he has fallen several times. What is it going to take, do we just wait until he is dead from a fall? She feels like we should be able to find other places due to [name] letting him fall. And not having a plan for injury prevention. The other issue is regarding communication. She had called during ED stays, left messages no one got back to her. She gets a call at [phone] stating he's back at [name]. Will address this with leadership.  Expected Discharge Plan and Services                                               Social Determinants of Health (SDOH) Interventions SDOH Screenings   Food Insecurity: Patient Unable To Answer (03/25/2024)  Housing: Unknown (03/25/2024)  Transportation Needs: Patient Unable To Answer (03/25/2024)  Utilities: Patient Unable To Answer (03/25/2024)  Depression (PHQ2-9): Low Risk  (12/10/2023)  Tobacco Use:  Medium Risk (03/17/2024)    Readmission Risk Interventions     No data to display

## 2024-03-25 NOTE — Progress Notes (Signed)
 PROGRESS NOTE    John [name]  FMW:[id number] DOB: 03-03-1961 DOA: 03/24/2024 PCP: [name], MD   Chief Complaint  Patient presents with   Fall    Brief Narrative:    John [name] is a 63 y.o. male with medical history significant of hypertension, CVA, Parkinson's disease, recent hospitalization within the last month, most recently end of June for acute PE, started on Eliquis , and another admission just discharged 7/7, after presenting with a concern for seizure with AKI and hyponatremia which resolved.  Patient was sent to SNF, presents to ED secondary to fall with facial trauma . - In ED patient was noted to be afebrile, but later developed fever 101.6,  While in the ED patient had been noted to have fallen out of bed and  ripped out his Foley catheter which was able to be replaced.    Assessment & Plan:   Principal Problem:   Frequent falls Active Problems:   Laceration of head   Urinary tract infection   Acute metabolic encephalopathy   Hypomagnesemia   History of pulmonary embolism   Parkinson's disease with fluctuating manifestations (HCC)   BPH with urinary obstruction   Obesity (BMI 30-39.9)   Frequent falls Laceration to forehead - Patient has a history of gait disturbance with frequent falls likely related to his history of Parkinson disease.   -CT scan of the cervical spine noted concern for cervical spinal stenosis.  Laceration repaired with sutures. -Patient remains high risk for fall, will consult PT/OT when more awake. - Check MRI of the cervical spine - Sitter to bedside - Transitions of care consulted   Urinary tract infection - Prior to arrival.   - Check urine culture - Continue Rocephin    Acute metabolic encephalopathy Patient noted to be altered with slurred speech.  CT scan of the head did not note any acute abnormality.  Unclear if symptoms are related to a concussion with repeated falls with trauma to the head or possibly  progression of Parkinson's disease. 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 - Follow-up pending - Check EEG - Check MRI  - Appreciate neurology plan of services -Borderline B12 level, started on IM supplements - follow on myelopathy zinc , copper , folate, Vitamin E - follow on nutritional causes for altered mental status: B1, B3, B6   Hypomagnesemia -Replaced   Pulmonary embolism Patient found to have bilateral pulmonary embolism during previous hospitalization - Continue Eliquis    Parkinson's disease - Continue Sinemet    BPH with urinary obstruction - Continue Foley catheter   Obesity BMI 33.4 kg/m   DVT prophylaxis: Eliquis  Code Status: Full Family Communication: None at bedside Disposition:   Status is: Inpatient    Consultants:  neurology   Subjective:  Patient has been on for pointes restraints earlier today, but currently has a sitter, sleeping comfortably, he wakes up answer couple questions confused, go back to sleep.  Objective: Vitals:   03/25/24 0300 03/25/24 0324 03/25/24 0400 03/25/24 0800  BP:  (!) 140/76 139/74 (!) 149/89  Pulse:  (!) 115 (!) 108 (!) 111  Resp:  (!) 29 (!) 22 19  Temp: 100 F (37.8 C)   99.1 F (37.3 C)  TempSrc: Axillary   Axillary  SpO2:  95% 99% 100%  Weight:      Height:        Intake/Output Summary (Last 24 hours) at 03/25/2024 1211 Last data filed at 03/25/2024 0641 Gross per 24  hour  Intake 1498.92 ml  Output 1200 ml  Net 298.92 ml   Filed Weights   03/24/24 0706  Weight: 118 kg    Examination:     He is sleeping comfortably, wake up, even though he is oriented x 3, but he is with impaired judgment and insight, does not even know his age. Face with multiple bruising, multiple serrations with sutures. Symmetrical Chest wall movement, Good air movement bilaterally, CTAB RRR,No Gallops,Rubs or new Murmurs, No Parasternal Heave +ve  B.Sounds, Abd Soft, No tenderness, No rebound - guarding or rigidity. No Cyanosis, Clubbing or edema, No new Rash or bruise     Data Reviewed: I have personally reviewed following labs and imaging studies  CBC: Recent Labs  Lab 03/23/24 1517 03/23/24 1527 03/24/24 0707 03/25/24 0624  WBC 8.8  --  9.2 7.1  NEUTROABS  --   --  6.7  --   HGB 9.4* 9.5* 8.9* 8.2*  HCT 30.2* 28.0* 28.7* 26.3*  MCV 88.8  --  87.8 86.5  PLT 318  --  304 277    Basic Metabolic Panel: Recent Labs  Lab 03/23/24 1517 03/23/24 1527 03/24/24 0707 03/25/24 0624  NA 137 140 138 142  K 3.5 3.3* 3.5 3.5  CL 109 108 110 111  CO2 17*  --  19* 23  GLUCOSE 116* 118* 122* 92  BUN 19 17 14 10   CREATININE 1.25* 1.20 1.12 1.40*  CALCIUM 8.8*  --  8.7* 8.6*  MG 1.6*  --  1.6*  --     GFR: Estimated Creatinine Clearance: 73.7 mL/min (A) (by C-G formula based on SCr of 1.4 mg/dL (H)).  Liver Function Tests: Recent Labs  Lab 03/23/24 1517 03/24/24 0707  AST 19 17  ALT 6 <5  ALKPHOS 88 83  BILITOT 0.8 1.2  PROT 5.9* 5.9*  ALBUMIN 3.1* 3.0*    CBG: No results for input(s): GLUCAP in the last 168 hours.   Recent Results (from the past 240 hours)  Urine Culture     Status: None (Preliminary result)   Collection Time: 03/23/24  6:45 PM   Specimen: Urine, Clean Catch  Result Value Ref Range Status   Specimen Description URINE, CLEAN CATCH  Final   Special Requests NONE  Final   Culture   Final    CULTURE REINCUBATED FOR BETTER GROWTH Performed at [facility], [address], [place], [place] [address]    Report Status PENDING  Incomplete  Blood culture (routine x 2)     Status: None (Preliminary result)   Collection Time: 03/24/24 11:44 AM   Specimen: BLOOD LEFT ARM  Result Value Ref Range Status   Specimen Description BLOOD LEFT ARM  Final   Special Requests   Final    BOTTLES DRAWN AEROBIC AND ANAEROBIC Blood Culture results may not be optimal due to an inadequate volume of blood  received in culture bottles   Culture   Final    NO GROWTH < 24 HOURS Performed at [facility], [address], [place], [place] [address]    Report Status PENDING  Incomplete  Blood culture (routine x 2)     Status: None (Preliminary result)   Collection Time: 03/24/24 11:50 AM   Specimen: BLOOD RIGHT ARM  Result Value Ref Range Status   Specimen Description BLOOD RIGHT ARM  Final   Special Requests   Final    BOTTLES DRAWN AEROBIC AND ANAEROBIC Blood Culture results may not be  optimal due to an inadequate volume of blood received in culture bottles   Culture   Final    NO GROWTH < 24 HOURS Performed at [facility], [address] [place], [place] [address]    Report Status PENDING  Incomplete         Radiology Studies: CT CERVICAL SPINE WO CONTRAST Result Date: 03/25/2024 CLINICAL DATA:  Multiple falls EXAM: CT CERVICAL SPINE WITHOUT CONTRAST TECHNIQUE: Multidetector CT imaging of the cervical spine was performed without intravenous contrast. Multiplanar CT image reconstructions were also generated. RADIATION DOSE REDUCTION: This exam was performed according to the departmental dose-optimization program which includes automated exposure control, adjustment of the mA and/or kV according to patient size and/or use of iterative reconstruction technique. COMPARISON:  None Available. FINDINGS: Alignment: Normal Skull base and vertebrae: No acute fracture. No primary bone lesion or focal pathologic process. Soft tissues and spinal canal: No prevertebral fluid or swelling. No visible canal hematoma. Disc levels: Diffuse cervical spondylosis. 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 Multilevel bilateral neural foraminal narrowing due to facet arthropathy. Upper chest: No acute findings Other: None IMPRESSION: No acute bony abnormality. Cervical spondylosis. Electronically  Signed   By: Franky Crease M.D.   On: 03/25/2024 00:11   CT HEAD WO CONTRAST ( ) Result Date: 03/25/2024 CLINICAL DATA:  Multiple falls. EXAM: CT HEAD WITHOUT CONTRAST TECHNIQUE: Contiguous axial images were obtained from the base of the skull through the vertex without intravenous contrast. RADIATION DOSE REDUCTION: This exam was performed according to the departmental dose-optimization program which includes automated exposure control, adjustment of the mA and/or kV according to patient size and/or use of iterative reconstruction technique. COMPARISON:  03/24/2024 FINDINGS: Brain: No acute intracranial abnormality. Specifically, no hemorrhage, hydrocephalus, mass lesion, acute infarction, or significant intracranial injury. Patchy chronic small vessel disease throughout the deep white matter. Vascular: No hyperdense vessel or unexpected calcification. Skull: No acute calvarial abnormality. Sinuses/Orbits: Opacification of the right maxillary sinus. Fracture through the anterior wall the right maxillary sinus again noted, unchanged. Other: None IMPRESSION: No acute intracranial abnormality. Patchy chronic small vessel disease throughout the deep white matter. Electronically Signed   By: Franky Crease M.D.   On: 03/25/2024 00:09   EEG adult Result Date: 03/24/2024 [name], MD     03/24/2024  9:26 PM Patient Name: John [name] MRN: [id number] Epilepsy Attending: Arlin KIDD Shelton Referring Physician/Provider: Remi Pippin, NP Date: 03/24/2024 Duration: 23.25 mins Patient history:  63 y.o. male with a past medical history of parkinson's disease, HTN, recent PE now on eliquis  presenting after multiple falls and altered mental status. EEG to evaluate for seizure  Level of alertness: Awake AEDs during EEG study: Ativan  Technical aspects: This EEG study was done with scalp electrodes positioned according to the 10-20 International system of electrode placement. Electrical activity was reviewed with band pass  filter of 1-70Hz , sensitivity of 7 uV/mm, display speed of 40mm/sec with a 60Hz  notched filter applied as appropriate. EEG data were recorded continuously and digitally stored.  Video monitoring was available and reviewed as appropriate. Description: The posterior dominant rhythm consists of 8 Hz activity of moderate voltage (25-35 uV) seen predominantly in posterior head regions, symmetric and reactive to eye opening and eye closing. Drowsiness was characterized by attenuation of the posterior background rhythm. Physiologic photic driving was not seen during photic stimulation.  Hyperventilation was not performed.    IMPRESSION:  This study is within normal limits. No seizures or epileptiform discharges were seen throughout the recording.  A normal interictal EEG does not exclude the diagnosis of epilepsy. Priyanka O Yadav   CT Head Wo Contrast Result Date: 03/24/2024 CLINICAL DATA:  Provided history: Head trauma, coagulopathy. EXAM: CT HEAD WITHOUT CONTRAST CT MAXILLOFACIAL WITHOUT CONTRAST CT CERVICAL SPINE WITHOUT CONTRAST TECHNIQUE: Multidetector CT imaging of the head, cervical spine, and maxillofacial structures were performed using the standard protocol without intravenous contrast. Multiplanar CT image reconstructions of the cervical spine and maxillofacial structures were also generated. RADIATION DOSE REDUCTION: This exam was performed according to the departmental dose-optimization program which includes automated exposure control, adjustment of the mA and/or kV according to patient size and/or use of iterative reconstruction technique. COMPARISON:  Head CT 03/23/2024. Maxillofacial CT 03/23/2024. Cervical spine CT 03/23/2024. FINDINGS: CT HEAD FINDINGS Mildly motion degraded examination. Within this limitation, findings are as follows. Brain: Generalized cerebral atrophy. Chronic lacunar infarct again demonstrated within the left basal ganglia region. Patchy and ill-defined hypoattenuation within the  cerebral white matter, nonspecific but compatible with mild chronic small vessel ischemic disease. There is no acute intracranial hemorrhage. No demarcated cortical infarct. No extra-axial fluid collection. No evidence of an intracranial mass. No midline shift. Vascular: No hyperdense vessel.  Atherosclerotic calcifications. Skull: No calvarial fracture or aggressive osseous lesion. Other: Right forehead hematoma and laceration. CT MAXILLOFACIAL FINDINGS Moderately motion degraded examination. Within this limitation, findings are as follows. Osseous: Known acute, displaced fractures of the anterior and posterolateral walls of the right maxillary sinus. Known acute, nondisplaced fractures of the medial wall the right orbit and right inferior orbital rim, better appreciated on the prior maxillofacial CT examinations of 03/23/2024 and 03/10/2024. Within the limitations of motion degradation, no interval acute maxillofacial fracture is identified. Orbits: The globes are normal in size and contour. No evidence of retrobulbar hematoma. Sinuses: Near complete opacification of the right maxillary sinus, similar to the prior examination of 03/23/2024 and at least partly due to the presence of hemorrhage. Moderate mucosal thickening within the right sphenoid sinus. Mild mucosal thickening scattered within bilateral ethmoid air cells. Soft tissues: Right forehead laceration and hematoma. Right facial hematoma. CT CERVICAL SPINE FINDINGS Alignment: Levocurvature of the cervical spine. Nonspecific reversal of the expected cervical lordosis. No significant spondylolisthesis. Skull base and vertebrae: The basion-dental and atlanto-dental intervals are maintained.No evidence of acute fracture to the cervical spine. C5-C6 vertebral ankylosis. Unchanged mild anterior wedge deformity of the T1 vertebral body. Soft tissues and spinal canal: No prevertebral fluid or swelling. No visible canal hematoma. Disc levels: Diffuse cervical  spondylosis. 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 Multilevel bony neural foraminal narrowing. Multilevel facet arthropathy. Prominent ventral osteophytes at C3-C4, C4-C5, C5-C6, C6-C7 and C7-T1. Upper chest: No consolidation within the imaged lung apices. No visible pneumothorax. IMPRESSION: CT head: 1. Mildly motion degraded exam. 2. No evidence of an acute intracranial abnormality. 3. Right forehead hematoma and laceration. 4. Cerebral atrophy and cerebral white matter chronic small vessel ischemic disease. 5. Redemonstrated chronic lacunar infarct in the left basal ganglia region. CT maxillofacial: 1. Moderately motion degraded examination. 2. Known acute fractures of the right maxillary sinus, medial right orbital wall and right inferior orbital rim. 3. Within the limitations of motion degradation, no interval acute maxillofacial fracture is identified. 4. Persistent near complete opacification of the right maxillary sinus (at least partly due to the presence of hemorrhage).  5. Mucosal thickening within the right sphenoid and bilateral ethmoid sinuses. 6. Right forehead hematoma and laceration. 7. Right facial hematoma. CT cervical spine: 1. No evidence of an acute cervical spine fracture. 2. Unchanged mild anterior wedge deformity of the T1 vertebral body. 3. Levocurvature of the cervical spine. 4. Nonspecific reversal of the expected cervical lordosis. 5. Cervical spondylosis as described within body of the report. 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 6. C5-C6 vertebral ankylosis. Electronically Signed   By: Rockey Childs D.O.   On: 03/24/2024 08:48   CT Cervical Spine Wo Contrast Result Date: 03/24/2024 CLINICAL DATA:  Provided history: Head trauma, coagulopathy. EXAM: CT HEAD WITHOUT CONTRAST  CT MAXILLOFACIAL WITHOUT CONTRAST CT CERVICAL SPINE WITHOUT CONTRAST TECHNIQUE: Multidetector CT imaging of the head, cervical spine, and maxillofacial structures were performed using the standard protocol without intravenous contrast. Multiplanar CT image reconstructions of the cervical spine and maxillofacial structures were also generated. RADIATION DOSE REDUCTION: This exam was performed according to the departmental dose-optimization program which includes automated exposure control, adjustment of the mA and/or kV according to patient size and/or use of iterative reconstruction technique. COMPARISON:  Head CT 03/23/2024. Maxillofacial CT 03/23/2024. Cervical spine CT 03/23/2024. FINDINGS: CT HEAD FINDINGS Mildly motion degraded examination. Within this limitation, findings are as follows. Brain: Generalized cerebral atrophy. Chronic lacunar infarct again demonstrated within the left basal ganglia region. Patchy and ill-defined hypoattenuation within the cerebral white matter, nonspecific but compatible with mild chronic small vessel ischemic disease. There is no acute intracranial hemorrhage. No demarcated cortical infarct. No extra-axial fluid collection. No evidence of an intracranial mass. No midline shift. Vascular: No hyperdense vessel.  Atherosclerotic calcifications. Skull: No calvarial fracture or aggressive osseous lesion. Other: Right forehead hematoma and laceration. CT MAXILLOFACIAL FINDINGS Moderately motion degraded examination. Within this limitation, findings are as follows. Osseous: Known acute, displaced fractures of the anterior and posterolateral walls of the right maxillary sinus. Known acute, nondisplaced fractures of the medial wall the right orbit and right inferior orbital rim, better appreciated on the prior maxillofacial CT examinations of 03/23/2024 and 03/10/2024. Within the limitations of motion degradation, no interval acute maxillofacial fracture is identified. Orbits: The globes  are normal in size and contour. No evidence of retrobulbar hematoma. Sinuses: Near complete opacification of the right maxillary sinus, similar to the prior examination of 03/23/2024 and at least partly due to the presence of hemorrhage. Moderate mucosal thickening within the right sphenoid sinus. Mild mucosal thickening scattered within bilateral ethmoid air cells. Soft tissues: Right forehead laceration and hematoma. Right facial hematoma. CT CERVICAL SPINE FINDINGS Alignment: Levocurvature of the cervical spine. Nonspecific reversal of the expected cervical lordosis. No significant spondylolisthesis. Skull base and vertebrae: The basion-dental and atlanto-dental intervals are maintained.No evidence of acute fracture to the cervical spine. C5-C6 vertebral ankylosis. Unchanged mild anterior wedge deformity of the T1 vertebral body. Soft tissues and spinal canal: No prevertebral fluid or swelling. No visible canal hematoma. Disc levels: Diffuse cervical spondylosis. 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 Multilevel bony neural foraminal narrowing. Multilevel facet arthropathy. Prominent ventral osteophytes at C3-C4, C4-C5, C5-C6, C6-C7 and C7-T1. Upper chest: No consolidation within the imaged lung apices. No visible pneumothorax. IMPRESSION: CT head: 1. Mildly motion degraded exam. 2. No evidence of an acute intracranial abnormality. 3. Right forehead hematoma and laceration. 4. Cerebral atrophy and cerebral  white matter chronic small vessel ischemic disease. 5. Redemonstrated chronic lacunar infarct in the left basal ganglia region. CT maxillofacial: 1. Moderately motion degraded examination. 2. Known acute fractures of the right maxillary sinus, medial right orbital wall and right inferior orbital rim. 3. Within the limitations of motion degradation, no interval acute maxillofacial fracture is identified. 4.  Persistent near complete opacification of the right maxillary sinus (at least partly due to the presence of hemorrhage). 5. Mucosal thickening within the right sphenoid and bilateral ethmoid sinuses. 6. Right forehead hematoma and laceration. 7. Right facial hematoma. CT cervical spine: 1. No evidence of an acute cervical spine fracture. 2. Unchanged mild anterior wedge deformity of the T1 vertebral body. 3. Levocurvature of the cervical spine. 4. Nonspecific reversal of the expected cervical lordosis. 5. Cervical spondylosis as described within body of the report. 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 6. C5-C6 vertebral ankylosis. Electronically Signed   By: Rockey Childs D.O.   On: 03/24/2024 08:48   CT Maxillofacial Wo Contrast Result Date: 03/24/2024 CLINICAL DATA:  Provided history: Head trauma, coagulopathy. EXAM: CT HEAD WITHOUT CONTRAST CT MAXILLOFACIAL WITHOUT CONTRAST CT CERVICAL SPINE WITHOUT CONTRAST TECHNIQUE: Multidetector CT imaging of the head, cervical spine, and maxillofacial structures were performed using the standard protocol without intravenous contrast. Multiplanar CT image reconstructions of the cervical spine and maxillofacial structures were also generated. RADIATION DOSE REDUCTION: This exam was performed according to the departmental dose-optimization program which includes automated exposure control, adjustment of the mA and/or kV according to patient size and/or use of iterative reconstruction technique. COMPARISON:  Head CT 03/23/2024. Maxillofacial CT 03/23/2024. Cervical spine CT 03/23/2024. FINDINGS: CT HEAD FINDINGS Mildly motion degraded examination. Within this limitation, findings are as follows. Brain: Generalized cerebral atrophy. Chronic lacunar infarct again demonstrated within the left basal ganglia region. Patchy and ill-defined hypoattenuation within the cerebral  white matter, nonspecific but compatible with mild chronic small vessel ischemic disease. There is no acute intracranial hemorrhage. No demarcated cortical infarct. No extra-axial fluid collection. No evidence of an intracranial mass. No midline shift. Vascular: No hyperdense vessel.  Atherosclerotic calcifications. Skull: No calvarial fracture or aggressive osseous lesion. Other: Right forehead hematoma and laceration. CT MAXILLOFACIAL FINDINGS Moderately motion degraded examination. Within this limitation, findings are as follows. Osseous: Known acute, displaced fractures of the anterior and posterolateral walls of the right maxillary sinus. Known acute, nondisplaced fractures of the medial wall the right orbit and right inferior orbital rim, better appreciated on the prior maxillofacial CT examinations of 03/23/2024 and 03/10/2024. Within the limitations of motion degradation, no interval acute maxillofacial fracture is identified. Orbits: The globes are normal in size and contour. No evidence of retrobulbar hematoma. Sinuses: Near complete opacification of the right maxillary sinus, similar to the prior examination of 03/23/2024 and at least partly due to the presence of hemorrhage. Moderate mucosal thickening within the right sphenoid sinus. Mild mucosal thickening scattered within bilateral ethmoid air cells. Soft tissues: Right forehead laceration and hematoma. Right facial hematoma. CT CERVICAL SPINE FINDINGS Alignment: Levocurvature of the cervical spine. Nonspecific reversal of the expected cervical lordosis. No significant spondylolisthesis. Skull base and vertebrae: The basion-dental and atlanto-dental intervals are maintained.No evidence of acute fracture to the cervical spine. C5-C6 vertebral ankylosis. Unchanged mild anterior wedge deformity of the T1 vertebral body. Soft tissues and spinal canal: No prevertebral fluid or swelling. No visible canal hematoma. Disc levels: Diffuse cervical spondylosis.  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 Multilevel bony neural foraminal narrowing. Multilevel facet arthropathy. Prominent ventral osteophytes at C3-C4, C4-C5, C5-C6, C6-C7 and C7-T1. Upper chest: No consolidation within the imaged lung apices. No visible pneumothorax. IMPRESSION: CT head: 1. Mildly motion degraded exam. 2. No evidence of an acute intracranial abnormality. 3. Right forehead hematoma and laceration. 4. Cerebral atrophy and cerebral white matter chronic small vessel ischemic disease. 5. Redemonstrated chronic lacunar infarct in the left basal ganglia region. CT maxillofacial: 1. Moderately motion degraded examination. 2. Known acute fractures of the right maxillary sinus, medial right orbital wall and right inferior orbital rim. 3. Within the limitations of motion degradation, no interval acute maxillofacial fracture is identified. 4. Persistent near complete opacification of the right maxillary sinus (at least partly due to the presence of hemorrhage). 5. Mucosal thickening within the right sphenoid and bilateral ethmoid sinuses. 6. Right forehead hematoma and laceration. 7. Right facial hematoma. CT cervical spine: 1. No evidence of an acute cervical spine fracture. 2. Unchanged mild anterior wedge deformity of the T1 vertebral body. 3. Levocurvature of the cervical spine. 4. Nonspecific reversal of the expected cervical lordosis. 5. Cervical spondylosis as described within body of the report. 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 6. C5-C6 vertebral ankylosis. Electronically Signed   By: Rockey Childs D.O.   On: 03/24/2024 08:48   DG Knee Complete 4 Views Left Result Date: 03/24/2024 CLINICAL DATA:  pain after fall EXAM: LEFT KNEE - COMPLETE 4+ VIEW COMPARISON:  February 16, 2024 FINDINGS: No  acute fracture or dislocation. Heterotopic bone formation along the fibular head. No joint effusion. There is no evidence of arthropathy or other focal bone abnormality. Soft tissues are unremarkable. IMPRESSION: No acute fracture or dislocation. Electronically Signed   By: Rogelia Myers M.D.   On: 03/24/2024 08:39   CT Cervical Spine Wo Contrast Result Date: 03/23/2024 CLINICAL DATA:  Trauma fall EXAM: CT CERVICAL SPINE WITHOUT CONTRAST TECHNIQUE: Multidetector CT imaging of the cervical spine was performed without intravenous contrast. Multiplanar CT image reconstructions were also generated. RADIATION DOSE REDUCTION: This exam was performed according to the departmental dose-optimization program which includes automated exposure control, adjustment of the mA and/or kV according to patient size and/or use of iterative reconstruction technique. COMPARISON:  CT 03/10/2024, 03/16/2024 FINDINGS: Alignment: Straightening of the cervical spine. No subluxation. Facet alignment is normal Skull base and vertebrae: No acute fracture. No primary bone lesion or focal pathologic process. Soft tissues and spinal canal: No prevertebral fluid or swelling. No visible canal hematoma. Disc levels: Diffuse degenerative changes with diffuse disc space narrowing, moderate severe narrowing at C5-C6, C6-C7 and C7-T1. 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 Multilevel facet degenerative changes. Severe foraminal narrowing at C3-C4. Bulky anterior osteophytes. Upper chest: Negative. Other: None IMPRESSION: 1. Straightening of the cervical spine with advanced degenerative changes. No acute osseous abnormality. 2. 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 Electronically Signed   By: Luke Bun M.D.   On: 03/23/2024 16:45   CT MAXILLOFACIAL WO CONTRAST Result Date: 03/23/2024 CLINICAL DATA:  Unwitnessed  fall EXAM: CT MAXILLOFACIAL WITHOUT CONTRAST TECHNIQUE: Multidetector CT imaging of the maxillofacial structures was performed. Multiplanar CT image reconstructions were also generated. RADIATION DOSE REDUCTION: This exam was performed  according to the departmental dose-optimization program which includes automated exposure control, adjustment of the mA and/or kV according to patient size and/or use of iterative reconstruction technique. COMPARISON:  Facial CT 03/10/2024 FINDINGS: Osseous: Mastoid air cells are clear. Mandibular heads are normally positioned. Heterogeneous sclerosis in the right mastoid. Pterygoid plates and zygomatic arches are intact. No acute nasal bone fracture Orbits: Redemonstrated nondisplaced fractures of the medial right orbital wall and inferior right orbital rim. Globes appear intact. No intraorbital soft tissue swelling. Chronic fracture deformity posterior floor of right orbit. Sinuses: Interval opacification of right maxillary sinus. Redemonstrated fractures of the anterior and posterolateral walls of the right maxillary sinus. Soft tissues: Residual right cheek hematoma. Deep laceration left forehead with small right forehead hematoma Limited intracranial: See separately dictated head CT IMPRESSION: 1. Redemonstrated fractures involving the right maxillary sinus and orbit, stable since 03/10/2024. No new fracture abnormality is seen. 2. Residual right cheek hematoma. Deep laceration left forehead with small right forehead scalp hematoma Electronically Signed   By: Luke Bun M.D.   On: 03/23/2024 16:37   DG Pelvis 1-2 Views Result Date: 03/23/2024 CLINICAL DATA:  Trauma fall EXAM: PELVIS - 1-2 VIEW COMPARISON:  03/16/2024 FINDINGS: SI joints are non widened. Pubic symphysis and rami appear intact. Limited assessment of femoral necks due to positioning and superimposition of trochanters. IMPRESSION: Limited assessment of femoral necks due to positioning. If there is high clinical  concern for hip fracture, dedicated hip radiographs versus CT follow-up. Otherwise no acute osseous abnormality is seen Electronically Signed   By: Luke Bun M.D.   On: 03/23/2024 16:28   DG Chest 1 View Result Date: 03/23/2024 CLINICAL DATA:  Fall trauma EXAM: CHEST  1 VIEW COMPARISON:  03/16/2024 FINDINGS: Hypoventilatory changes. No acute airspace disease or effusion. Stable cardiomediastinal silhouette. No pneumothorax IMPRESSION: No active disease. Hypoventilatory changes. Electronically Signed   By: Luke Bun M.D.   On: 03/23/2024 16:27   DG Tibia/Fibula Left Result Date: 03/23/2024 CLINICAL DATA:  Fall EXAM: LEFT TIBIA AND FIBULA - 2 VIEW COMPARISON:  CT knee 02/16/2024 FINDINGS: No fracture or malalignment. Heterotopic ossification surrounding the proximal fibula with ankylosis of the proximal tibiofibular joint as seen on prior CT. Soft tissues are unremarkable IMPRESSION: No acute osseous abnormality. Electronically Signed   By: Luke Bun M.D.   On: 03/23/2024 16:26   CT Head Wo Contrast Result Date: 03/23/2024 CLINICAL DATA:  Unwitnessed fall laceration to left eyebrow trauma EXAM: CT HEAD WITHOUT CONTRAST TECHNIQUE: Contiguous axial images were obtained from the base of the skull through the vertex without intravenous contrast. RADIATION DOSE REDUCTION: This exam was performed according to the departmental dose-optimization program which includes automated exposure control, adjustment of the mA and/or kV according to patient size and/or use of iterative reconstruction technique. COMPARISON:  CT brain 03/10/2024, 03/16/2024 FINDINGS: Brain: No acute territorial infarction, hemorrhage or intracranial mass. Moderate atrophy. Mild chronic small vessel ischemic changes of the white matter. Stable ventricle size. Vascular: No hyperdense vessels.  Carotid vascular calcification. Skull: No depressed skull fracture. Sinuses/Orbits: See separately dictated facial CT. Other: Laceration and  hematoma to the left greater than right forehead. IMPRESSION: 1. No definite CT evidence for acute intracranial abnormality. 2. Atrophy and chronic small vessel ischemic changes of the white matter. 3. Laceration and hematoma to the left greater than right forehead. Electronically Signed   By: Luke Bun M.D.   On: 03/23/2024 16:24        Scheduled Meds:  carbidopa -levodopa   1 tablet Oral 6 times per day   Chlorhexidine  Gluconate Cloth  6 each Topical Daily   cyanocobalamin   1,000 mcg Subcutaneous Q30 days   sodium chloride  flush  3 mL Intravenous Q12H   [START ON 04/03/2024] thiamine  (VITAMIN B1) injection  100 mg Intravenous Daily   Continuous Infusions:  cefTRIAXone  (ROCEPHIN )  IV Stopped (03/24/24 2033)   lactated ringers  125 mL/hr at 03/25/24 1203   thiamine  (VITAMIN B1) injection 500 mg (03/25/24 1052)   Followed by   NOREEN ON 03/28/2024] thiamine  (VITAMIN B1) injection       LOS: 1 day       Brayton Lye, MD [facility]   To contact the attending provider between 7A-7P or the covering provider during after hours 7P-7A, please log into the web site [email or web address] and access using universal [place] password for that web site. If you do not have the password, please call the hospital operator.  03/25/2024, 12:11 PM

## 2024-03-25 NOTE — Plan of Care (Signed)

## 2024-03-26 ENCOUNTER — Inpatient Hospital Stay (HOSPITAL_COMMUNITY)

## 2024-03-26 DIAGNOSIS — R4182 Altered mental status, unspecified: Secondary | ICD-10-CM | POA: Diagnosis not present

## 2024-03-26 DIAGNOSIS — G20A1 Parkinson's disease without dyskinesia, without mention of fluctuations: Secondary | ICD-10-CM | POA: Diagnosis not present

## 2024-03-26 DIAGNOSIS — R296 Repeated falls: Secondary | ICD-10-CM | POA: Diagnosis not present

## 2024-03-26 DIAGNOSIS — Z7901 Long term (current) use of anticoagulants: Secondary | ICD-10-CM

## 2024-03-26 DIAGNOSIS — R569 Unspecified convulsions: Secondary | ICD-10-CM | POA: Diagnosis not present

## 2024-03-26 LAB — BASIC METABOLIC PANEL WITH GFR
Anion gap: 10 (ref 5–15)
BUN: 10 mg/dL (ref 8–23)
CO2: 20 mmol/L — ABNORMAL LOW (ref 22–32)
Calcium: 8.1 mg/dL — ABNORMAL LOW (ref 8.9–10.3)
Chloride: 110 mmol/L (ref 98–111)
Creatinine, Ser: 0.93 mg/dL (ref 0.61–1.24)
GFR, Estimated: >60 mL/min (ref >60)
Glucose, Bld: 92 mg/dL (ref 70–99)
Potassium: 3.3 mmol/L — ABNORMAL LOW (ref 3.5–5.1)
Sodium: 140 mmol/L (ref 135–145)

## 2024-03-26 LAB — CBC
HCT: 25.6 % — ABNORMAL LOW (ref 39.0–52.0)
Hemoglobin: 8 g/dL — ABNORMAL LOW (ref 13.0–17.0)
MCH: 27.3 pg (ref 26.0–34.0)
MCHC: 31.3 g/dL (ref 30.0–36.0)
MCV: 87.4 fL (ref 80.0–100.0)
Platelets: 277 K/uL (ref 150–400)
RBC: 2.93 MIL/uL — ABNORMAL LOW (ref 4.22–5.81)
RDW: 14.9 % (ref 11.5–15.5)
WBC: 5.5 K/uL (ref 4.0–10.5)
nRBC: 0 % (ref 0.0–0.2)

## 2024-03-26 LAB — URINE CULTURE: Culture: >=100000 — AB

## 2024-03-26 LAB — VITAMIN E
Vitamin E (Alpha Tocopherol): 10.9 mg/L (ref 9.0–29.0)
Vitamin E(Gamma Tocopherol): 0.8 mg/L (ref 0.5–4.9)

## 2024-03-26 LAB — METHYLMALONIC ACID, SERUM: Methylmalonic Acid, Quantitative: 84 nmol/L (ref 0–378)

## 2024-03-26 LAB — MAGNESIUM: Magnesium: 1.8 mg/dL (ref 1.7–2.4)

## 2024-03-26 LAB — PHOSPHORUS: Phosphorus: 2.3 mg/dL — ABNORMAL LOW (ref 2.5–4.6)

## 2024-03-26 MED ORDER — POTASSIUM CHLORIDE CRYS ER 20 MEQ PO TBCR
40.0000 meq | EXTENDED_RELEASE_TABLET | Freq: Four times a day (QID) | ORAL | Status: AC
  Administered 2024-03-26 (×2): 40 meq via ORAL
  Filled 2024-03-26 (×2): qty 2

## 2024-03-26 MED ORDER — LORAZEPAM 2 MG/ML IJ SOLN
1.0000 mg | Freq: Once | INTRAMUSCULAR | Status: AC
  Administered 2024-03-26: 1 mg via INTRAVENOUS
  Filled 2024-03-26: qty 1

## 2024-03-26 MED ORDER — K PHOS MONO-SOD PHOS DI & MONO 155-852-130 MG PO TABS
500.0000 mg | ORAL_TABLET | Freq: Three times a day (TID) | ORAL | Status: AC
  Administered 2024-03-26 – 2024-03-28 (×6): 500 mg via ORAL
  Filled 2024-03-26 (×6): qty 2

## 2024-03-26 MED ORDER — TIZANIDINE HCL 4 MG PO TABS: 4.0000 mg | ORAL_TABLET | Freq: Once | ORAL | Status: DC | PRN

## 2024-03-26 MED ORDER — TIZANIDINE HCL 2 MG PO TABS
2.0000 mg | ORAL_TABLET | Freq: Once | ORAL | Status: AC | PRN
  Administered 2024-03-26: 2 mg via ORAL
  Filled 2024-03-26: qty 1

## 2024-03-26 MED ORDER — SENNOSIDES-DOCUSATE SODIUM 8.6-50 MG PO TABS
1.0000 | ORAL_TABLET | Freq: Two times a day (BID) | ORAL | Status: DC
  Administered 2024-03-26 – 2024-04-16 (×36): 1 via ORAL
  Filled 2024-03-26 (×40): qty 1

## 2024-03-26 MED ORDER — LORAZEPAM 2 MG/ML IJ SOLN
0.5000 mg | Freq: Once | INTRAMUSCULAR | Status: AC | PRN
  Administered 2024-03-26: 0.5 mg via INTRAVENOUS
  Filled 2024-03-26: qty 1

## 2024-03-26 NOTE — Plan of Care (Signed)

## 2024-03-26 NOTE — TOC Initial Note (Signed)
 Transition of Care [facility]) - Initial/Assessment Note    Patient Details  Name: John [name] MRN: [id number] Date of Birth: [birth year]  [facility] CM/SW Contact:    [name], LCSW Phone Number: 03/26/2024, 3:53 PM  Clinical Narrative:                 CSW spoke with patient's sister and she expressed frustration with patient's ED experience and care at [name]. CSW explained that after conducting a SNF search, the only facility willing to accept his insurance is [facility] and they would be able to proceed with a Medicaid verification if family chooses to proceed. She stated she will speak with patient's son to see if it is too far away. She reported being unable to take patient home with her so CSW explained that CIR is not an option.   Expected Discharge Plan: Skilled Nursing Facility Barriers to Discharge: Continued Medical Work up, English as a second language teacher, SNF Pending bed offer   Patient Goals and CMS Choice Patient states their goals for this hospitalization and ongoing recovery are:: SNF CMS Medicare.gov Compare Post Acute Care list provided to:: Patient Represenative (must comment) Choice offered to / list presented to : Sibling [place] ownership interest in [facility].provided to:: Sibling    Expected Discharge Plan and Services In-house Referral: Clinical Social Work   Post Acute Care Choice: Skilled Nursing Facility Living arrangements for the past 2 months: Single Family Home, Skilled Nursing Facility                                      Prior Living Arrangements/Services Living arrangements for the past 2 months: Single Family Home, Skilled Nursing Facility   Patient language and need for interpreter reviewed:: Yes Do you feel safe going back to the place where you live?: Yes      Need for Family Participation in Patient Care: Yes (Comment) Care giver support system in place?: Yes (comment)   Criminal Activity/Legal  Involvement Pertinent to Current Situation/Hospitalization: No - Comment as needed  Activities of Daily Living   ADL Screening (condition at time of admission) Independently performs ADLs?: No Does the patient have a NEW difficulty with bathing/dressing/toileting/self-feeding that is expected to last >3 days?: Yes (Initiates electronic notice to provider for possible OT consult) Does the patient have a NEW difficulty with getting in/out of bed, walking, or climbing stairs that is expected to last >3 days?: Yes (Initiates electronic notice to provider for possible PT consult) Does the patient have a NEW difficulty with communication that is expected to last >3 days?: Yes (Initiates electronic notice to provider for possible SLP consult) Is the patient deaf or have difficulty hearing?: Yes Does the patient have difficulty seeing, even when wearing glasses/contacts?: No Does the patient have difficulty concentrating, remembering, or making decisions?: No  Permission Sought/Granted Permission sought to share information with : Facility Medical sales representative, Family Supports Permission granted to share information with : Yes, Verbal Permission Granted  Share Information with NAME: [name]  Permission granted to share info w AGENCY: SNFs  Permission granted to share info w Relationship: Sister  Permission granted to share info w Contact Information: [phone]  Emotional Assessment Appearance:: Appears stated age Attitude/Demeanor/Rapport: Unable to Assess Affect (typically observed): Unable to Assess Orientation: : Oriented to Self Alcohol / Substance Use: Not Applicable Psych Involvement: No (comment)  Admission diagnosis:  Slurred speech [R47.81]  Encephalopathy acute [G93.40] Acute cystitis with hematuria [N30.01] Acute encephalopathy [G93.40] Frequent falls [R29.6] Facial laceration, initial encounter [S01.81XA] Injury of head, initial encounter [S09.90XA] Fall, initial encounter  [W19.XXXA] Acute metabolic encephalopathy [G93.41] Patient Active Problem List   Diagnosis Date Noted   Frequent falls 03/24/2024   Acute encephalopathy 03/24/2024   Acute metabolic encephalopathy 03/24/2024   Laceration of head 03/24/2024   Urinary tract infection 03/24/2024   Hypomagnesemia 03/24/2024   History of pulmonary embolism 03/24/2024   BPH with urinary obstruction 03/24/2024   Obesity (BMI 30-39.9) 03/24/2024   Transient loss of consciousness 03/17/2024   First time seizure (HCC) 03/16/2024   AKI (acute kidney injury) (HCC) 03/16/2024   Bilateral pulmonary embolism (HCC) 03/16/2024   Normocytic anemia 03/16/2024   Parkinson disease (HCC) 03/16/2024   Lactic acidosis 03/16/2024   Acute pulmonary embolism (HCC) 03/10/2024   ARF (acute renal failure) (HCC) 03/10/2024   Hypokalemia 03/10/2024   Hyponatremia 03/10/2024   Maxillary fracture (HCC) 03/10/2024   Anemia 03/10/2024   Tinnitus of both ears 12/10/2023   Parkinson's disease with fluctuating manifestations (HCC) 12/02/2022   Routine general medical examination at a health care facility 10/29/2021   Family history of prostate cancer 10/29/2021   Essential hypertension 10/29/2021   Gastroesophageal reflux disease without esophagitis 10/29/2021   Obesity (BMI 30.0-34.9) 10/29/2021   PCP:  [name], MD Pharmacy:   [facility] [address] [place] - [address] [place] [address] Phone: [phone] Fax: [phone]  [facility] [place] - [address] [place]) [place] [address] Phone: [phone] Fax: [phone]  [facility] [address] [place] [address] Phone: [phone] Fax: [phone]     Social Drivers of Health (SDOH) Social History: SDOH Screenings   Food Insecurity: Patient Unable To Answer (03/25/2024)  Housing: Unknown  (03/25/2024)  Transportation Needs: Patient Unable To Answer (03/25/2024)  Utilities: Patient Unable To Answer (03/25/2024)  Depression (PHQ2-9): Low Risk  (12/10/2023)  Tobacco Use: Medium Risk (03/17/2024)   SDOH Interventions:     Readmission Risk Interventions     No data to display

## 2024-03-26 NOTE — Progress Notes (Signed)
 PROGRESS NOTE    John [name]  FMW:[id number] DOB: 1961-01-13 DOA: 03/24/2024 PCP: [name], MD   Chief Complaint  Patient presents with   Fall    Brief Narrative:    John [name] is a 63 y.o. male with medical history significant of hypertension, CVA, Parkinson's disease, recent hospitalization within the last month, most recently end of June for acute PE, started on Eliquis , and another admission just discharged 7/7, after presenting with a concern for seizure with AKI and hyponatremia which resolved.  Patient was sent to SNF, presents to ED secondary to fall with facial trauma . - In ED patient was noted to be afebrile, but later developed fever 101.6,  While in the ED patient had been noted to have fallen out of bed and  ripped out his Foley catheter which was able to be replaced.    Assessment & Plan:   Principal Problem:   Frequent falls Active Problems:   Laceration of head   Urinary tract infection   Acute metabolic encephalopathy   Hypomagnesemia   History of pulmonary embolism   Parkinson's disease with fluctuating manifestations (HCC)   BPH with urinary obstruction   Obesity (BMI 30-39.9)   Frequent falls Laceration to forehead - Patient has a history of gait disturbance with frequent falls likely related to his history of Parkinson disease.   -CT scan of the cervical spine noted concern for cervical spinal stenosis.  Laceration repaired with sutures. -Patient remains high risk for fall, will consult PT/OT when more awake. - MRI cervical spine poor quality, but overall sufficient to rule out any significant compressive disease, plan for MRI T-spine and L-spine today with tizanidine  and Ativan  prior to study to allow for better imaging - Sitter to bedside - Transitions of care consulted -High-dose thiamine  empirically, B1 level is pending   Urinary tract infection - Prior to arrival.   - Check urine culture - Continue Rocephin    Acute metabolic  encephalopathy Patient noted to be altered with slurred speech.  CT scan of the head did not note any acute abnormality.  Unclear if symptoms are related to a concussion with repeated falls with trauma to the head or possibly progression of Parkinson's disease. 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 - Appreciate neurology plan of services -Borderline B12 level, started on IM supplements - follow on myelopathy zinc , copper , folate, Vitamin E  - follow on nutritional causes for altered mental status: B1, B3, B6   Hypomagnesemia -Replaced   Pulmonary embolism Patient found to have bilateral pulmonary embolism during previous hospitalization - Continue Eliquis    Parkinson's disease - Continue Sinemet    BPH with urinary obstruction - Continue Foley catheter   Obesity BMI 33.4 kg/m  Hypokalemia Hypophosphatemia -Replaced   DVT prophylaxis: Eliquis  Code Status: Full Family Communication: None at bedside Disposition:   Status is: Inpatient    Consultants:  neurology   Subjective:  No significant events overnight as discussed with staff, he is more conversant Objective: Vitals:   03/26/24 0941 03/26/24 0946 03/26/24 0950 03/26/24 0953  BP: 110/66 117/76 96/62 (!) 87/67  Pulse:      Resp: 18 (!) 22 19 (!) 22  Temp:      TempSrc:      SpO2:      Weight:      Height:        Intake/Output Summary (Last 24 hours) at 03/26/2024 1223 Last data filed at 03/26/2024 [phone]  Gross per 24 hour  Intake 3278.06 ml  Output 1601 ml  Net 1677.06 ml   Filed Weights   03/24/24 0706  Weight: 118 kg    Examination:    Awake Alert, Oriented X 2, he is aware of year, month, name but not location, but he looks more conversant and appropriate today, which is slow but no aphasia, Good air entry bilaterally Abdomen soft Regular rate and rhythm Extremities no edema, has multiple bruising in the body from previous  falls   Picture on 7/10   Data Reviewed: I have personally reviewed following labs and imaging studies  CBC: Recent Labs  Lab 03/23/24 1517 03/23/24 1527 03/24/24 0707 03/25/24 0624 03/26/24 0628  WBC 8.8  --  9.2 7.1 5.5  NEUTROABS  --   --  6.7  --   --   HGB 9.4* 9.5* 8.9* 8.2* 8.0*  HCT 30.2* 28.0* 28.7* 26.3* 25.6*  MCV 88.8  --  87.8 86.5 87.4  PLT 318  --  304 277 277    Basic Metabolic Panel: Recent Labs  Lab 03/23/24 1517 03/23/24 1527 03/24/24 0707 03/25/24 0624 03/26/24 0628  NA 137 140 138 142 140  K 3.5 3.3* 3.5 3.5 3.3*  CL 109 108 110 111 110  CO2 17*  --  19* 23 20*  GLUCOSE 116* 118* 122* 92 92  BUN 19 17 14 10 10   CREATININE 1.25* 1.20 1.12 1.40* 0.93  CALCIUM 8.8*  --  8.7* 8.6* 8.1*  MG 1.6*  --  1.6* 1.7 1.8  PHOS  --   --   --  2.4* 2.3*    GFR: Estimated Creatinine Clearance: 111 mL/min (by C-G formula based on SCr of 0.93 mg/dL).  Liver Function Tests: Recent Labs  Lab 03/23/24 1517 03/24/24 0707  AST 19 17  ALT 6 <5  ALKPHOS 88 83  BILITOT 0.8 1.2  PROT 5.9* 5.9*  ALBUMIN 3.1* 3.0*    CBG: No results for input(s): GLUCAP in the last 168 hours.   Recent Results (from the past 240 hours)  Urine Culture     Status: Abnormal   Collection Time: 03/23/24  6:45 PM   Specimen: Urine, Clean Catch  Result Value Ref Range Status   Specimen Description URINE, CLEAN CATCH  Final   Special Requests NONE  Final   Culture (A)  Final    >=100,000 COLONIES/mL AEROCOCCUS SPECIES Standardized susceptibility testing for this organism is not available. Performed at [facility], [address] [place], [place] [address]    Report Status 03/26/2024 FINAL  Final  Blood culture (routine x 2)     Status: None (Preliminary result)   Collection Time: 03/24/24 11:44 AM   Specimen: BLOOD LEFT ARM  Result Value Ref Range Status   Specimen Description BLOOD LEFT ARM  Final   Special Requests   Final    BOTTLES DRAWN AEROBIC AND  ANAEROBIC Blood Culture results may not be optimal due to an inadequate volume of blood received in culture bottles   Culture   Final    NO GROWTH 2 DAYS Performed at [facility], [address], [place], [place] [address]    Report Status PENDING  Incomplete  Blood culture (routine x 2)     Status: None (Preliminary result)   Collection Time: 03/24/24 11:50 AM   Specimen: BLOOD RIGHT ARM  Result Value Ref Range Status   Specimen Description BLOOD RIGHT ARM  Final   Special  Requests   Final    BOTTLES DRAWN AEROBIC AND ANAEROBIC Blood Culture results may not be optimal due to an inadequate volume of blood received in culture bottles   Culture   Final    NO GROWTH 2 DAYS Performed at [facility], [address], [place], [place] [address]    Report Status PENDING  Incomplete         Radiology Studies: MR CERVICAL SPINE WO CONTRAST Result Date: 03/25/2024 CLINICAL DATA:  Initial evaluation for acute myelopathy. EXAM: MRI CERVICAL SPINE WITHOUT CONTRAST TECHNIQUE: Multiplanar, multisequence MR imaging of the cervical spine was performed. No intravenous contrast was administered. COMPARISON:  CT from earlier the same day FINDINGS: Alignment: Examination technically limited as the patient was unable to tolerate the full length of the exam. Sagittal imaging only was performed. No axial sequences were obtained. Additionally, provided images are degraded by motion artifact. Straightening of the normal cervical lordosis. Trace degenerative retrolisthesis of C3 on C4, with trace anterolisthesis of C6 on C7. Vertebrae: Vertebral body height maintained without acute or chronic fracture. Bone marrow signal intensity overall within normal limits. No visible worrisome osseous lesions. Suspected mild reactive marrow edema about the C3-4 facets due to facet arthritis. No other abnormal marrow edema. Cord: Grossly normal signal and morphology on this limited motion degraded exam. Posterior  Fossa, vertebral arteries, paraspinal tissues: No visible abnormality on this limited exam. Disc levels: C2-C3: Mild disc bulge. Mild facet hypertrophy. No significant stenosis. C3-C4: Diffuse disc bulge with bilateral uncovertebral spurring. Mild to moderate spinal stenosis. Foramina not well assessed on this exam. C4-C5: Diffuse disc bulge with uncovertebral spurring. No significant spinal stenosis. Foramina appear grossly patent. C5-C6: Mild disc bulge with endplate and uncovertebral spurring. No significant stenosis. C6-C7: Degenerative intervertebral disc space narrowing with diffuse disc osteophyte complex. No significant spinal stenosis. No more than mild bilateral foraminal stenosis. C7-T1: Mild disc bulge. Bilateral facet arthrosis. No significant canal or foraminal stenosis. IMPRESSION: 1. Technically limited exam due to the patient's inability to tolerate the study. Sagittal imaging only was performed. No axial sequences were obtained. Additionally, provided images are degraded by motion. 2. Grossly normal MRI appearance of the cervical spinal cord on this limited exam. 3. Multilevel cervical spondylosis with resultant mild to moderate spinal stenosis at C3-4. No other significant spinal stenosis. Evaluation of the neural foramina limited on this truncated exam. Electronically Signed   By: Morene Hoard M.D.   On: 03/25/2024 21:36   CT CERVICAL SPINE WO CONTRAST Result Date: 03/25/2024 CLINICAL DATA:  Multiple falls EXAM: CT CERVICAL SPINE WITHOUT CONTRAST TECHNIQUE: Multidetector CT imaging of the cervical spine was performed without intravenous contrast. Multiplanar CT image reconstructions were also generated. RADIATION DOSE REDUCTION: This exam was performed according to the departmental dose-optimization program which includes automated exposure control, adjustment of the mA and/or kV according to patient size and/or use of iterative reconstruction technique. COMPARISON:  None Available.  FINDINGS: Alignment: Normal Skull base and vertebrae: No acute fracture. No primary bone lesion or focal pathologic process. Soft tissues and spinal canal: No prevertebral fluid or swelling. No visible canal hematoma. Disc levels: Diffuse cervical spondylosis. 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 Multilevel bilateral neural foraminal narrowing due to facet arthropathy. Upper chest: No acute findings Other: None IMPRESSION: No acute bony abnormality. Cervical spondylosis. Electronically Signed   By: Franky Crease M.D.   On: 03/25/2024 00:11  CT HEAD WO CONTRAST ( ) Result Date: 03/25/2024 CLINICAL DATA:  Multiple falls. EXAM: CT HEAD WITHOUT CONTRAST TECHNIQUE: Contiguous axial images were obtained from the base of the skull through the vertex without intravenous contrast. RADIATION DOSE REDUCTION: This exam was performed according to the departmental dose-optimization program which includes automated exposure control, adjustment of the mA and/or kV according to patient size and/or use of iterative reconstruction technique. COMPARISON:  03/24/2024 FINDINGS: Brain: No acute intracranial abnormality. Specifically, no hemorrhage, hydrocephalus, mass lesion, acute infarction, or significant intracranial injury. Patchy chronic small vessel disease throughout the deep white matter. Vascular: No hyperdense vessel or unexpected calcification. Skull: No acute calvarial abnormality. Sinuses/Orbits: Opacification of the right maxillary sinus. Fracture through the anterior wall the right maxillary sinus again noted, unchanged. Other: None IMPRESSION: No acute intracranial abnormality. Patchy chronic small vessel disease throughout the deep white matter. Electronically Signed   By: Franky Crease M.D.   On: 03/25/2024 00:09   EEG adult Result Date: 03/24/2024 [name], MD     03/24/2024  9:26 PM Patient  Name: John [name] MRN: [id number] Epilepsy Attending: Arlin KIDD Shelton Referring Physician/Provider: Remi Pippin, NP Date: 03/24/2024 Duration: 23.25 mins Patient history:  63 y.o. male with a past medical history of parkinson's disease, HTN, recent PE now on eliquis  presenting after multiple falls and altered mental status. EEG to evaluate for seizure  Level of alertness: Awake AEDs during EEG study: Ativan  Technical aspects: This EEG study was done with scalp electrodes positioned according to the 10-20 International system of electrode placement. Electrical activity was reviewed with band pass filter of 1-70Hz , sensitivity of 7 uV/mm, display speed of 82mm/sec with a 60Hz  notched filter applied as appropriate. EEG data were recorded continuously and digitally stored.  Video monitoring was available and reviewed as appropriate. Description: The posterior dominant rhythm consists of 8 Hz activity of moderate voltage (25-35 uV) seen predominantly in posterior head regions, symmetric and reactive to eye opening and eye closing. Drowsiness was characterized by attenuation of the posterior background rhythm. Physiologic photic driving was not seen during photic stimulation.  Hyperventilation was not performed.    IMPRESSION: This study is within normal limits. No seizures or epileptiform discharges were seen throughout the recording.  A normal interictal EEG does not exclude the diagnosis of epilepsy. Priyanka O Yadav        Scheduled Meds:  apixaban   5 mg Oral BID   carbidopa -levodopa   1 tablet Oral 6 times per day   Chlorhexidine  Gluconate Cloth  6 each Topical Daily   cyanocobalamin   1,000 mcg Subcutaneous Q30 days   LORazepam   1 mg Intravenous Once   sodium chloride  flush  3 mL Intravenous Q12H   [START ON 04/03/2024] thiamine  (VITAMIN B1) injection  100 mg Intravenous Daily   Continuous Infusions:  cefTRIAXone  (ROCEPHIN )  IV Stopped (03/25/24 2214)   lactated ringers  125 mL/hr at 03/26/24 0854    thiamine  (VITAMIN B1) injection Stopped (03/26/24 0543)   Followed by   NOREEN ON 03/28/2024] thiamine  (VITAMIN B1) injection       LOS: 2 days       Brayton Lye, MD [facility]   To contact the attending provider between 7A-7P or the covering provider during after hours 7P-7A, please log into the web site [email or web address] and access using universal [place] password for that web site. If you do not have the password, please call the hospital operator.  03/26/2024, 12:23 PM

## 2024-03-26 NOTE — Evaluation (Signed)
 Occupational Therapy Evaluation Patient Details Name: John [name] MRN: [id number] DOB: 05/31/1961 Today's Date: 03/26/2024   History of Present Illness   Pt is a 63 year old man admitted from [facility] SNF with frequent falls and AMS. Head CT negative. +facial lacerations. PMH: Parkinsons Disease, HYN, CVA, PE, anemia, recent B PEs, UTI.     Clinical Impressions Pt reports walking with a quad cane and being independent in basic ADLs. He relies on SNF staff for IADLs. Pt presents with impaired cognition, generalized weakness, poor standing balance with posterior lean and orthostatic hypotension (see vitals flowsheet). Pt is currently requiring +2 assist for OOB with RW and max to total assist for ADLs. Will follow acutely. Patient will benefit from continued inpatient follow up therapy, <3 hours/day.     If plan is discharge home, recommend the following:   Two people to help with walking and/or transfers;A lot of help with bathing/dressing/bathroom;Assistance with cooking/housework;Direct supervision/assist for medications management;Direct supervision/assist for financial management;Assist for transportation;Help with stairs or ramp for entrance     Functional Status Assessment   Patient has had a recent decline in their functional status and/or demonstrates limited ability to make significant improvements in function in a reasonable and predictable amount of time     Equipment Recommendations   Other (comment) (defer)     Recommendations for Other Services         Precautions/Restrictions   Precautions Precautions: Fall Recall of Precautions/Restrictions: Impaired Precaution/Restrictions Comments: watch BP/ HR Restrictions Weight Bearing Restrictions Per Provider Order: No     Mobility Bed Mobility Overal bed mobility: Needs Assistance Bed Mobility: Supine to Sit, Sit to Supine     Supine to sit: Min assist, HOB elevated, Used rails Sit to supine:  Contact guard assist   General bed mobility comments: multimodal cues to initiate, light min assist for L foot over EOB and to raise trunk, no physical assist to return to supine    Transfers Overall transfer level: Needs assistance Equipment used: Rolling walker (2 wheels) Transfers: Sit to/from Stand Sit to Stand: +2 physical assistance, Min assist, From elevated surface           General transfer comment: cues for hand placement, assist to rise and steady, pt with increasing posterior lean requiring +2 mod assist during orthostatic vitals      Balance Overall balance assessment: Needs assistance   Sitting balance-Leahy Scale: Fair     Standing balance support: Bilateral upper extremity supported, During functional activity, Reliant on assistive device for balance Standing balance-Leahy Scale: Poor Standing balance comment: Reliant on RW and posterior lean                           ADL either performed or assessed with clinical judgement   ADL                                         General ADL Comments: Pt is currently needing max to total assist for ADLs due to impaired balance and cognition.     Vision Baseline Vision/History: 1 Wears glasses Additional Comments: glasses are not at hospital     Perception         Praxis         Pertinent Vitals/Pain Pain Assessment Pain Assessment: Faces Faces Pain Scale: No hurt     Extremity/Trunk Assessment  Upper Extremity Assessment Upper Extremity Assessment: Generalized weakness;Right hand dominant   Lower Extremity Assessment Lower Extremity Assessment: Defer to PT evaluation   Cervical / Trunk Assessment Cervical / Trunk Assessment: Other exceptions (weakness)   Communication Communication Communication: Impaired Factors Affecting Communication: Reduced clarity of speech   Cognition Arousal: Alert Behavior During Therapy: Flat affect, Restless Cognition: Cognition  impaired   Orientation impairments: Place, Time, Situation Awareness: Intellectual awareness impaired, Online awareness impaired Memory impairment (select all impairments): Working [employer] fast streamer, Conservation officer, historic buildings Attention impairment (select first level of impairment): Sustained attention Executive functioning impairment (select all impairments): Reasoning, Problem solving, Sequencing, Initiation                   Following commands: Impaired Following commands impaired: Follows one step commands inconsistently, Follows one step commands with increased time     Cueing  General Comments   Cueing Techniques: Verbal cues;Gestural cues      Exercises     Shoulder Instructions      Home Living Family/patient expects to be discharged to:: Skilled nursing facility                                        Prior Functioning/Environment Prior Level of Function : Needs assist             Mobility Comments: reports using a quad cane, frequent falls ADLs Comments: reports no assist for basic ADLs, assisted by SNF staff for IADLs    OT Problem List: Decreased activity tolerance;Impaired balance (sitting and/or standing);Decreased cognition;Impaired vision/perception;Decreased safety awareness;Cardiopulmonary status limiting activity;Decreased knowledge of use of DME or AE   OT Treatment/Interventions: Self-care/ADL training;DME and/or AE instruction;Therapeutic activities;Patient/family education;Balance training;Cognitive remediation/compensation      OT Goals(Current goals can be found in the care plan section)   Acute Rehab OT Goals OT Goal Formulation: With patient Time For Goal Achievement: 04/09/24 Potential to Achieve Goals: Fair ADL Goals Pt Will Perform Eating: Independently;sitting Pt Will Perform Grooming: with contact guard assist;standing Pt Will Perform Upper Body Bathing: with min assist;sitting Pt Will Perform Upper Body Dressing:  with supervision;sitting Pt Will Transfer to Toilet: with contact guard assist;ambulating;bedside commode Additional ADL Goal #1: Pt will complete bed mobility modified independently in preparation for ADLs.   OT Frequency:  Min 2X/week    Co-evaluation PT/OT/SLP Co-Evaluation/Treatment: Yes Reason for Co-Treatment: For patient/therapist safety          AM-PAC OT 6 Clicks Daily Activity     Outcome Measure Help from another person eating meals?: A Lot Help from another person taking care of personal grooming?: A Lot Help from another person toileting, which includes using toliet, bedpan, or urinal?: Total Help from another person bathing (including washing, rinsing, drying)?: Total Help from another person to put on and taking off regular upper body clothing?: A Lot Help from another person to put on and taking off regular lower body clothing?: Total 6 Click Score: 9   End of Session Equipment Utilized During Treatment: Gait belt;Rolling walker (2 wheels) Nurse Communication: Other (comment) (aware pt is orthostatic)  Activity Tolerance: Treatment limited secondary to medical complications (Comment) (pt + for orthostatic hypotension) Patient left: in bed;with call bell/phone within reach;with restraints reapplied;with nursing/sitter in room;with bed alarm set  OT Visit Diagnosis: Unsteadiness on feet (R26.81);Other abnormalities of gait and mobility (R26.89);Other symptoms and signs involving cognitive function;Muscle weakness (generalized) (M62.81);History of falling (  Z91.81)                Time: 9062-8998 OT Time Calculation (min): 24 min Charges:  OT General Charges $OT Visit: 1 Visit OT Evaluation $OT Eval Moderate Complexity: 1 Mod  John [name], OTR/L Acute Rehabilitation Services Office: [phone]   Kennth John Helling 03/26/2024, 10:58 AM

## 2024-03-26 NOTE — Evaluation (Signed)
 Speech Language Pathology Evaluation Patient Details Name: John [name] MRN: [id number] DOB: [birth year] Today's Date: 03/26/2024 Time: 1355-1405 SLP Time Calculation (min) (ACUTE ONLY): 10 min  Problem List:  Patient Active Problem List   Diagnosis Date Noted   Frequent falls 03/24/2024   Acute encephalopathy 03/24/2024   Acute metabolic encephalopathy 03/24/2024   Laceration of head 03/24/2024   Urinary tract infection 03/24/2024   Hypomagnesemia 03/24/2024   History of pulmonary embolism 03/24/2024   BPH with urinary obstruction 03/24/2024   Obesity (BMI 30-39.9) 03/24/2024   Transient loss of consciousness 03/17/2024   First time seizure (HCC) 03/16/2024   AKI (acute kidney injury) (HCC) 03/16/2024   Bilateral pulmonary embolism (HCC) 03/16/2024   Normocytic anemia 03/16/2024   Parkinson disease (HCC) 03/16/2024   Lactic acidosis 03/16/2024   Acute pulmonary embolism (HCC) 03/10/2024   ARF (acute renal failure) (HCC) 03/10/2024   Hypokalemia 03/10/2024   Hyponatremia 03/10/2024   Maxillary fracture (HCC) 03/10/2024   Anemia 03/10/2024   Tinnitus of both ears 12/10/2023   Parkinson's disease with fluctuating manifestations (HCC) 12/02/2022   Routine general medical examination at a health care facility 10/29/2021   Family history of prostate cancer 10/29/2021   Essential hypertension 10/29/2021   Gastroesophageal reflux disease without esophagitis 10/29/2021   Obesity (BMI 30.0-34.9) 10/29/2021   Past Medical History:  Past Medical History:  Diagnosis Date   Bilateral pulmonary embolism (HCC) 03/16/2024   Hypertension    Parkinson disease (HCC) 03/16/2024   Parkinson's disease (HCC)    Stroke [facility])    Past Surgical History: No past surgical history on file. HPI:  Pt is a 63 year old man admitted from [place] [facility] with frequent falls and acute metabolic encephalopathy. CT scan of the head did not note any acute abnormality.  Per MD notes, it is unclear if  symptoms are related to a concussion with repeated falls with trauma to the head or possibly progression of Parkinson's disease. +facial lacerations. PMH: Parkinsons Disease, HYN, CVA, PE, anemia, recent B PEs, UTI.   Assessment / Plan / Recommendation Clinical Impression  Pt presents with gross cognitive deficits and poorly intelligible speech - he was sleeping intermittently during assessment. Presents with inattention, impaired awareness, impaired registration and basic recall.  Followed one step commands inconsistently. Speech is fluent but difficult to understand with imprecise consonants and hypernasal resonance.  Pt is oriented to himself and some elements of time; not location or situation.  Recommend SLP f/u - will reassess cognition as his wakefulness improves. Per notes, has had some meds to help him rest; he was communicating more effectively with staff earlier today. SLP will follow.    SLP Assessment  SLP Recommendation/Assessment: Patient needs continued Speech Language Pathology Services SLP Visit Diagnosis: Cognitive communication deficit (R41.841)     Assistance Recommended at Discharge  Frequent or constant Supervision/Assistance  Functional Status Assessment Patient has had a recent decline in their functional status and demonstrates the ability to make significant improvements in function in a reasonable and predictable amount of time.  Frequency and Duration min 2x/week  2 weeks      SLP Evaluation Cognition  Overall Cognitive Status: Impaired/Different from baseline Arousal/Alertness: Lethargic Orientation Level: Oriented to person;Disoriented to situation;Disoriented to place Attention: Sustained Sustained Attention: Impaired Sustained Attention Impairment: Verbal basic;Functional basic Memory: Impaired Memory Impairment: Storage deficit Awareness: Impaired Awareness Impairment: Intellectual impairment Problem Solving: Impaired Safety/Judgment: Impaired        Comprehension  Auditory Comprehension Yes/No Questions: Within Functional Limits  Commands: Impaired One Step Basic Commands: 50-74% accurate Reading Comprehension Reading Status: Not tested    Expression Expression Primary Mode of Expression: Verbal Verbal Expression Overall Verbal Expression: Other (comment) (no obvious expressive language deficits) Written Expression Written Expression: Not tested   Oral / Motor  Oral Motor/Sensory Function Overall Oral Motor/Sensory Function: Within functional limits Motor Speech Overall Motor Speech: Impaired Phonation: Normal Resonance: Hypernasality Articulation: Impaired Level of Impairment: Phrase Motor Planning: Within functional limits Motor Speech Errors: Not applicable            Vona Palma Laurice 03/26/2024, 2:17 PM Sylvana Bonk L. Vona, MA CCC/SLP Clinical Specialist - Acute Care SLP Acute Rehabilitation Services Office number [phone]

## 2024-03-26 NOTE — Progress Notes (Addendum)
 SLP Cancellation Note  Patient Details Name: John [name] MRN: [id number] DOB: 1961/08/13   Cancelled treatment:       Reason Eval/Treat Not Completed: Patient at procedure or test/unavailable. Pt with PT/OT  at 0955. Currently off unit - will continue efforts to complete SLE.   [place] Virginia B. Dory, MSP, CCC-SLP Speech Language Pathologist Office: [phone]  Dory Caprice Daring 03/26/2024, 9:55 AM, 11:32 AM

## 2024-03-26 NOTE — Evaluation (Signed)
 Physical Therapy Evaluation Patient Details Name: John John [name] MRN: [id number] DOB: 1960/10/05 Today's Date: 03/26/2024  History of Present Illness  Pt is a 63 year old man admitted from [facility] with frequent falls and AMS. Head CT negative. +facial lacerations. PMH: Parkinsons Disease, HYN, CVA, PE, anemia, recent John [name] PEs, UTI.  Clinical Impression  Pt presents with admitting diagnosis above. Co-treat with OT. Pt today was able to stand with +2 Min A however was very limited by positive orthostatics. BP: 110/66 supine, BP: 117/76 seated, BP: 96/62 standing, BP: 87/67 standing afte 3 minutes. RN made aware. Patient will benefit from continued inpatient follow up therapy, <3 hours/day. PT will continue to follow.         If plan is discharge home, recommend the following: A lot of help with bathing/dressing/bathroom;A little help with walking and/or transfers;Assist for transportation;Help with stairs or ramp for entrance;Supervision due to cognitive status   Can travel by private vehicle   No    Equipment Recommendations Other (comment)  Recommendations for Other Services       Functional Status Assessment Patient has had a recent decline in their functional status and demonstrates the ability to make significant improvements in function in a reasonable and predictable amount of time.     Precautions / Restrictions Precautions Precautions: Fall Recall of Precautions/Restrictions: Impaired Precaution/Restrictions Comments: watch BP/ HR Restrictions Weight Bearing Restrictions Per Provider Order: No      Mobility  Bed Mobility Overal bed mobility: Needs Assistance Bed Mobility: Supine to Sit, Sit to Supine     Supine to sit: Min assist, HOB elevated, Used rails Sit to supine: Contact guard assist   General bed mobility comments: multimodal cues to initiate, light min assist for L foot over EOB and to raise trunk, no physical assist to return to supine     Transfers Overall transfer level: Needs assistance Equipment used: Rolling walker (2 wheels) Transfers: Sit to/from Stand Sit to Stand: +2 physical assistance, Min assist, From elevated surface           General transfer comment: cues for hand placement, assist to rise and steady, pt with increasing posterior lean requiring +2 mod assist during orthostatic vitals    Ambulation/Gait               General Gait Details: Deferred for safety  Stairs            Wheelchair Mobility     Tilt Bed    Modified Rankin (Stroke Patients Only)       Balance Overall balance assessment: Needs assistance   Sitting balance-Leahy Scale: Fair     Standing balance support: Bilateral upper extremity supported, During functional activity, Reliant on assistive device for balance Standing balance-Leahy Scale: Poor Standing balance comment: Reliant on RW and posterior lean                             Pertinent Vitals/Pain Pain Assessment Faces Pain Scale: No hurt    Home Living Family/patient expects to be discharged to:: Skilled nursing facility     Type of Home:  (SNF)                  Prior Function Prior Level of Function : Needs assist             Mobility Comments: reports using a quad cane, frequent falls ADLs Comments: reports no assist for basic ADLs, assisted by  SNF staff for IADLs     Extremity/Trunk Assessment             Cervical / Trunk Assessment Cervical / Trunk Assessment: Other exceptions (weakness)  Communication   Communication Communication: Impaired Factors Affecting Communication: Reduced clarity of speech    Cognition Arousal: Alert Behavior During Therapy: Flat affect, Restless                             Following commands: Impaired Following commands impaired: Follows one step commands inconsistently, Follows one step commands with increased time     Cueing Cueing Techniques: Verbal  cues, Gestural cues     General Comments General comments (skin integrity, edema, etc.): BP: 110/66 supine, BP: 117/76 seated, BP: 96/62 standing, BP: 87/67 standing afte 3 minutes.    Exercises     Assessment/Plan    PT Assessment Patient needs continued PT services  PT Problem List Decreased activity tolerance;Decreased balance;Decreased mobility;Decreased coordination;Decreased cognition;Decreased safety awareness       PT Treatment Interventions DME instruction;Gait training;Functional mobility training;Therapeutic activities;Balance training;Patient/family education    PT Goals (Current goals can be found in the Care Plan section)  Acute Rehab PT Goals Patient Stated Goal: stop falling so much PT Goal Formulation: With patient Time For Goal Achievement: 04/09/24 Potential to Achieve Goals: Fair    Frequency Min 2X/week     Co-evaluation PT/OT/SLP Co-Evaluation/Treatment: Yes Reason for Co-Treatment: For patient/therapist safety PT goals addressed during session: Mobility/safety with mobility OT goals addressed during session: ADL's and self-care       AM-PAC PT 6 Clicks Mobility  Outcome Measure Help needed turning from your back to your side while in a flat bed without using bedrails?: A Little Help needed moving from lying on your back to sitting on the side of a flat bed without using bedrails?: A Little Help needed moving to and from a bed to a chair (including a wheelchair)?: Total Help needed standing up from a chair using your arms (e.g., wheelchair or bedside chair)?: A Lot Help needed to walk in hospital room?: A Lot Help needed climbing 3-5 steps with a railing? : Total 6 Click Score: 12    End of Session Equipment Utilized During Treatment: Gait belt Activity Tolerance: Treatment limited secondary to medical complications (Comment) (Orthostatics) Patient left: in bed;with call bell/phone within reach;with bed alarm set;with nursing/sitter in  room Nurse Communication: Mobility status PT Visit Diagnosis: Unsteadiness on feet (R26.81);Other abnormalities of gait and mobility (R26.89);Other symptoms and signs involving the nervous system (R29.898) Pain - Right/Left: Right    Time: 9061-8998 PT Time Calculation (min) (ACUTE ONLY): 23 min   Charges:   PT Evaluation $PT Eval Moderate Complexity: 1 Mod   PT General Charges $$ ACUTE PT VISIT: 1 Visit         John John [name], PT, DPT Acute Rehab Services [phone]   John John [name] 03/26/2024, 3:54 PM

## 2024-03-26 NOTE — Progress Notes (Signed)
 NEUROLOGY CONSULT FOLLOW UP NOTE   Date of service: March 26, 2024 Patient Name: John [name] MRN:  [id number] DOB:  [birth year]  Interval Hx/subjective   More alert today Tolerated only a few sequences of MRI scan, had leg spasms per his report States my sister is exaggerating when asked about concerns living independently Recalls he has been having muscle spasms in both legs. Tizanidine  helped   Vitals   Vitals:   03/25/24 2004 03/25/24 2337 03/26/24 0007 03/26/24 0349  BP:   127/85 138/76  Pulse: 97  97 94  Resp: 20  20 14   Temp:  99.1 F (37.3 C)  97.7 F (36.5 C)  TempSrc:  Axillary  Axillary  SpO2: 98%  98% 97%  Weight:      Height:         Body mass index is 33.4 kg/m.  Physical Exam   Constitutional: Appears more well this morning Psych: Calm, cooperative  HENT: No OP obstruction. Multiple areas of contusions, bruising, 2 lacerations on the front of his face.  Tender to touch of the left eyebrow in particular Cardiovascular: Tachycardic Respiratory: No distress, regular unlabored GI: Soft.  No distension. There is no tenderness.  Skin: WDI.   Neurologic Examination   Neuro: Mental Status: Awake, follows commands, oriented to Jolynn Pack, Harvey, month, year, but states it is the 8th. Slightly poor attention / short term memory (2 trials to remember it is the 11th after a short delay).  Cranial Nerves: II: PERRL III,IV, VI: EOMI without diplopia V: Facial sensation is symmetric to light touch VII: Facial movement is symmetric resting and smiling VIII: Hearing is intact to voice X: Palate elevates symmetrically XI: Shoulder shrug is symmetric. XII: Tongue protrudes midline without atrophy or fasciculations.  Motor: 5/5 throughout upper extremities. 4+/5 Right hip flexion otherwise 5/5 in the lowers Sensory: Sensation is symmetric to light touch in the arms and legs. Cerebellar: Intact heel to shin bilaterally  Reflexes: 3+ bilateral  brachioradialis and 3+ bilateral patellae  Medications  Current Facility-Administered Medications:    acetaminophen  (TYLENOL ) tablet 650 mg, 650 mg, Oral, Q6H PRN, 650 mg at 03/25/24 2139 **OR** acetaminophen  (TYLENOL ) suppository 650 mg, 650 mg, Rectal, Q6H PRN, Claudene Reeves A, MD, 650 mg at 03/24/24 2329   albuterol  (PROVENTIL ) (2.5 MG/3ML) 0.083% nebulizer solution 2.5 mg, 2.5 mg, Nebulization, Q6H PRN, Claudene, Rondell A, MD   apixaban  (ELIQUIS ) tablet 5 mg, 5 mg, Oral, BID, Elgergawy, Dawood S, MD, 5 mg at 03/25/24 2133   carbidopa -levodopa  (SINEMET  CR) 50-200 MG per tablet controlled release 1 tablet, 1 tablet, Oral, 6 times per day, Claudene Reeves A, MD, 1 tablet at 03/26/24 0511   cefTRIAXone  (ROCEPHIN ) 1 g in sodium chloride  0.9 % 100 mL IVPB, 1 g, Intravenous, Q24H, Smith, Rondell A, MD, Stopped at 03/25/24 2214   Chlorhexidine  Gluconate Cloth 2 % PADS 6 each, 6 each, Topical, Daily, Claudene Reeves A, MD, 6 each at 03/25/24 9094   cyanocobalamin  (VITAMIN B12) injection 1,000 mcg, 1,000 mcg, Subcutaneous, Q30 days, Elgergawy, Brayton RAMAN, MD, 1,000 mcg at 03/25/24 1226   lactated ringers  infusion, , Intravenous, Continuous, Plunkett, Whitney, MD, Last Rate: 125 mL/hr at 03/25/24 2240, New Bag at 03/25/24 2240   LORazepam  (ATIVAN ) injection 0.5 mg, 0.5 mg, Intravenous, Q6H PRN, Claudene, Rondell A, MD, 0.5 mg at 03/25/24 1956   ondansetron  (ZOFRAN ) tablet 4 mg, 4 mg, Oral, Q6H PRN **OR** ondansetron  (ZOFRAN ) injection 4 mg, 4 mg, Intravenous, Q6H PRN, Claudene, Rondell A,  MD   phosphorus (K PHOS  NEUTRAL) tablet 500 mg, 500 mg, Oral, TID, Elgergawy, Dawood S, MD, 500 mg at 03/25/24 2133   sodium chloride  flush (NS) 0.9 % injection 3 mL, 3 mL, Intravenous, Q12H, Smith, Rondell A, MD, 3 mL at 03/25/24 2241   thiamine  (VITAMIN B1) 500 mg in sodium chloride  0.9 % 50 mL IVPB, 500 mg, Intravenous, Q8H, Stopped at 03/26/24 0543 **FOLLOWED BY** [START ON 03/28/2024] thiamine  (VITAMIN B1) 250 mg in sodium  chloride 0.9 % 50 mL IVPB, 250 mg, Intravenous, Daily **FOLLOWED BY** [START ON 04/03/2024] thiamine  (VITAMIN B1) injection 100 mg, 100 mg, Intravenous, Daily, Jerrie Lola CROME, MD  Labs and Diagnostic Imaging   CBC:  Recent Labs  Lab 03/24/24 0707 03/25/24 0624 03/26/24 0628  WBC 9.2 7.1 5.5  NEUTROABS 6.7  --   --   HGB 8.9* 8.2* 8.0*  HCT 28.7* 26.3* 25.6*  MCV 87.8 86.5 87.4  PLT 304 277 277    Basic Metabolic Panel:  Lab Results  Component Value Date   NA 142 03/25/2024   K 3.5 03/25/2024   CO2 23 03/25/2024   GLUCOSE 92 03/25/2024   BUN 10 03/25/2024   CREATININE 1.40 (H) 03/25/2024   CALCIUM 8.6 (L) 03/25/2024   GFRNONAA 56 (L) 03/25/2024   GFRAA 88 03/13/2020   Lipid Panel:  Lab Results  Component Value Date   LDLCALC 73 12/10/2023   Alcohol Level     Component Value Date/Time   ETH <15 03/16/2024 2143   INR  Lab Results  Component Value Date   INR 1.3 (H) 03/23/2024   Folate - 10.6 Copper  - mild elevation 135 (ref 69 - 132) Zinc  - 57 B6  B3 B1 Vitamin E  MMA  B12- 206  CT Head without contrast 7/9 and 7/10 (Personally reviewed): No acute intracranial abnormality. Patchy chronic small vessel disease throughout the deep white matter.  CT C- spine 7/9: No acute bony abnormality. Cervical spondylosis with up to moderate to severe stenosis at C3/4   MRI C-spine 7/10 1. Technically limited exam due to the patient's inability to tolerate the study. Sagittal imaging only was performed. No axial sequences were obtained. Additionally, provided images are degraded by motion. 2. Grossly normal MRI appearance of the cervical spinal cord on this limited exam. 3. Multilevel cervical spondylosis with resultant mild to moderate spinal stenosis at C3-4. No other significant spinal stenosis. Evaluation of the neural foramina limited on this truncated exam.   rEEG: 7/9  IMPRESSION: This study is within normal limits. No seizures or epileptiform  discharges were seen throughout the recording. A normal interictal EEG does not exclude the diagnosis of epilepsy.    Assessment   CHELSEA NUSZ is a 63 y.o. male with a past medical history of parkinson's disease, HTN, recent PE now on eliquis  presenting after multiple falls and altered mental status.  Significant gradual decline consistent with progressive Parkinson's disease.  Unable to safely manage medications at home or maintain home in a safe living condition.  Some possible cognitive decline as well although was still holding down his job at [employer] until disability recently approved within the past 2 months or so.  Mental status continuing to improve but does wax and wane, exam today reassuring overall but remains hyper-reflexive and high risk for having other spine injury. 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 - Potential for progression of  degenerative disc disease - Potential postconcussive syndrome - Possible underlying seizure, though no clear description of seizures - Delirium in the setting of pain, hospitalization, head injury, medications - Concern for underlying poor nutritional status  Recommendations  - MRI T-spine and L-spine - Tizanidine  4 mg one to facilitate MRI imaging - Orthostatic vitals, ted stockings and abdominal binder to be used only if positive and only before ambulation (remove when at rest) - Low normal B12 at 206, check MMA to see if functionally deficient and in the interim start empiric supplementation for goal level greater than 600 - Follow up pending labs:  Vitamin E , B1, B3, B6, MMA - Please continue carbidopa /levodopa  at his home dose - Empiric thiamine  supplementation pending B1 result (Wernicke's dosing used given high benefit, low risk) - Neurology will follow  along ______________________________________________________________________   Lola Jernigan MD-PhD [facility] [phone]

## 2024-03-27 ENCOUNTER — Telehealth (HOSPITAL_BASED_OUTPATIENT_CLINIC_OR_DEPARTMENT_OTHER): Payer: Self-pay | Admitting: *Deleted

## 2024-03-27 DIAGNOSIS — M549 Dorsalgia, unspecified: Secondary | ICD-10-CM

## 2024-03-27 DIAGNOSIS — R296 Repeated falls: Secondary | ICD-10-CM | POA: Diagnosis not present

## 2024-03-27 DIAGNOSIS — G20A1 Parkinson's disease without dyskinesia, without mention of fluctuations: Secondary | ICD-10-CM | POA: Diagnosis not present

## 2024-03-27 DIAGNOSIS — M4802 Spinal stenosis, cervical region: Secondary | ICD-10-CM | POA: Diagnosis not present

## 2024-03-27 DIAGNOSIS — G9340 Encephalopathy, unspecified: Secondary | ICD-10-CM | POA: Diagnosis not present

## 2024-03-27 LAB — BASIC METABOLIC PANEL WITH GFR
Anion gap: 8 (ref 5–15)
BUN: 7 mg/dL — ABNORMAL LOW (ref 8–23)
CO2: 20 mmol/L — ABNORMAL LOW (ref 22–32)
Calcium: 8.2 mg/dL — ABNORMAL LOW (ref 8.9–10.3)
Chloride: 111 mmol/L (ref 98–111)
Creatinine, Ser: 0.93 mg/dL (ref 0.61–1.24)
GFR, Estimated: >60 mL/min (ref >60)
Glucose, Bld: 89 mg/dL (ref 70–99)
Potassium: 3.7 mmol/L (ref 3.5–5.1)
Sodium: 139 mmol/L (ref 135–145)

## 2024-03-27 LAB — CBC
HCT: 25 % — ABNORMAL LOW (ref 39.0–52.0)
Hemoglobin: 8.1 g/dL — ABNORMAL LOW (ref 13.0–17.0)
MCH: 27.7 pg (ref 26.0–34.0)
MCHC: 32.4 g/dL (ref 30.0–36.0)
MCV: 85.6 fL (ref 80.0–100.0)
Platelets: 298 K/uL (ref 150–400)
RBC: 2.92 MIL/uL — ABNORMAL LOW (ref 4.22–5.81)
RDW: 14.6 % (ref 11.5–15.5)
WBC: 5.5 K/uL (ref 4.0–10.5)
nRBC: 0 % (ref 0.0–0.2)

## 2024-03-27 LAB — MAGNESIUM: Magnesium: 1.6 mg/dL — ABNORMAL LOW (ref 1.7–2.4)

## 2024-03-27 LAB — PHOSPHORUS: Phosphorus: 2.4 mg/dL — ABNORMAL LOW (ref 2.5–4.6)

## 2024-03-27 LAB — VITAMIN B1: Vitamin B1 (Thiamine): 81.7 nmol/L (ref 66.5–200.0)

## 2024-03-27 MED ORDER — ZINC SULFATE 220 (50 ZN) MG PO CAPS
220.0000 mg | ORAL_CAPSULE | Freq: Every day | ORAL | Status: DC
  Administered 2024-03-27 – 2024-04-15 (×19): 220 mg via ORAL
  Filled 2024-03-27 (×20): qty 1

## 2024-03-27 MED ORDER — POTASSIUM CHLORIDE CRYS ER 20 MEQ PO TBCR: 40.0000 meq | EXTENDED_RELEASE_TABLET | Freq: Four times a day (QID) | ORAL | Status: DC

## 2024-03-27 MED ORDER — POTASSIUM CHLORIDE CRYS ER 20 MEQ PO TBCR
40.0000 meq | EXTENDED_RELEASE_TABLET | Freq: Once | ORAL | Status: AC
  Administered 2024-03-27: 40 meq via ORAL
  Filled 2024-03-27: qty 2

## 2024-03-27 MED ORDER — CYANOCOBALAMIN 1000 MCG/ML IJ SOLN
1000.0000 ug | Freq: Every day | INTRAMUSCULAR | Status: AC
  Administered 2024-03-27 – 2024-04-01 (×6): 1000 ug via SUBCUTANEOUS
  Filled 2024-03-27 (×6): qty 1

## 2024-03-27 MED ORDER — CYANOCOBALAMIN 1000 MCG/ML IJ SOLN
1000.0000 ug | INTRAMUSCULAR | Status: DC
  Administered 2024-04-02: 1000 ug via SUBCUTANEOUS
  Filled 2024-03-27 (×2): qty 1

## 2024-03-27 MED ORDER — MAGNESIUM SULFATE 4 GM/100ML IV SOLN
4.0000 g | Freq: Once | INTRAVENOUS | Status: AC
  Administered 2024-03-27: 4 g via INTRAVENOUS
  Filled 2024-03-27: qty 100

## 2024-03-27 MED ORDER — VITAMIN E 45 MG (100 UNIT) PO CAPS
400.0000 [IU] | ORAL_CAPSULE | Freq: Every day | ORAL | Status: DC
  Administered 2024-03-27 – 2024-04-16 (×21): 400 [IU] via ORAL
  Filled 2024-03-27 (×21): qty 4

## 2024-03-27 NOTE — Progress Notes (Addendum)
 PROGRESS NOTE    TERESO UNANGST  FMW:[id number] DOB: [birth year] DOA: 03/24/2024 PCP: [name], MD   Chief Complaint  Patient presents with   Fall    Brief Narrative:    John [name] is a 63 y.o. male with medical history significant of hypertension, CVA, Parkinson's disease, recent hospitalization within the last month, most recently end of June for acute PE, started on Eliquis , and another admission just discharged 7/7, after presenting with a concern for seizure with AKI and hyponatremia which resolved.  Patient was sent to SNF, presents to ED secondary to fall with facial trauma . - In ED patient was noted to be afebrile, but later developed fever 101.6,  While in the ED patient had been noted to have fallen out of bed and  ripped out his Foley catheter which was able to be replaced.    Assessment & Plan:   Principal Problem:   Frequent falls Active Problems:   Laceration of head   Urinary tract infection   Acute metabolic encephalopathy   Hypomagnesemia   History of pulmonary embolism   Parkinson's disease with fluctuating manifestations (HCC)   BPH with urinary obstruction   Obesity (BMI 30-39.9)   Frequent falls Laceration to forehead - Patient has a history of gait disturbance with frequent falls likely related to his history of Parkinson disease.   -CT scan of the cervical spine noted concern for cervical spinal stenosis.  Laceration repaired with sutures. -Patient remains high risk for fall, will consult PT/OT when more awake. - MRI cervical spine poor quality, but overall sufficient to rule out any significant compressive disease, went for MRI T-spine/L-spine, he received tizanidine  and significant dose of Ativan , despite that remains poor quality, will await further recommendations from neurology if needed to be repeated . - Sitter to bedside - Transitions of care consulted -High-dose thiamine  empirically, B1 level is pending -Vitamin E , and zinc   borderline low so we will start on some supplements   Urinary tract infection - Prior to arrival.   - Continue Rocephin    Acute metabolic encephalopathy Patient noted to be altered with slurred speech.  CT scan of the head did not note any acute abnormality.  Unclear if symptoms are related to a concussion with repeated falls with trauma to the head or possibly progression of Parkinson's disease. 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   Obesity BMI 33.4 kg/m  Hypokalemia Hypophosphatemia -Replaced   DVT prophylaxis: Eliquis  Code Status: Full Family Communication: None at bedside Disposition:   Status is: Inpatient    Consultants:  neurology   Subjective:  No significant events overnight as discussed with staff, remains restless, confused, still requiring sitter. Objective: Vitals:   03/27/24 0337 03/27/24 0400 03/27/24 0727 03/27/24 1134  BP:  123/83 (!) 143/86 (!) 134/97  Pulse:  (!) 106 100 92  Resp:  (!) 26 19 20   Temp: 98.3 F (36.8 C) 98.2 F (36.8  C) 99.1 F (37.3 C) 97.9 F (36.6 C)  TempSrc: Oral   Oral  SpO2:  97% 100% 95%  Weight:      Height:        Intake/Output Summary (Last 24 hours) at 03/27/2024 1357 Last data filed at 03/27/2024 1136 Gross per 24 hour  Intake 3666.31 ml  Output 3150 ml  Net 516.31 ml    Filed Weights   03/24/24 0706  Weight: 118 kg    Examination:    Awake, alert, oriented x 2, conversant, but significantly impaired judgment and insight.  Significant facial bruising and forehead laceration. Good air entry bilaterally Abdomen soft Regular rate and rhythm Extremities no edema, has multiple bruising in the body from previous falls   Picture on 7/10   Data Reviewed: I have personally reviewed following labs and imaging studies  CBC: Recent Labs  Lab 03/23/24 1517 03/23/24 1527 03/24/24 0707 03/25/24 0624 03/26/24 0628 03/27/24 0624  WBC 8.8  --  9.2 7.1 5.5 5.5  NEUTROABS  --   --  6.7  --   --   --   HGB 9.4* 9.5* 8.9* 8.2* 8.0* 8.1*  HCT 30.2* 28.0* 28.7* 26.3* 25.6* 25.0*  MCV 88.8  --  87.8 86.5 87.4 85.6  PLT 318  --  304 277 277 298    Basic Metabolic Panel: Recent Labs  Lab 03/23/24 1517 03/23/24 1527 03/24/24 0707 03/25/24 0624 03/26/24 0628 03/27/24 0624  NA 137 140 138 142 140 139  K 3.5 3.3* 3.5 3.5 3.3* 3.7  CL 109 108 110 111 110 111  CO2 17*  --  19* 23 20* 20*  GLUCOSE 116* 118* 122* 92 92 89  BUN 19 17 14 10 10  7*  CREATININE 1.25* 1.20 1.12 1.40* 0.93 0.93  CALCIUM 8.8*  --  8.7* 8.6* 8.1* 8.2*  MG 1.6*  --  1.6* 1.7 1.8 1.6*  PHOS  --   --   --  2.4* 2.3* 2.4*    GFR: Estimated Creatinine Clearance: 111 mL/min (by C-G formula based on SCr of 0.93 mg/dL).  Liver Function Tests: Recent Labs  Lab 03/23/24 1517 03/24/24 0707  AST 19 17  ALT 6 <5  ALKPHOS 88 83  BILITOT 0.8 1.2  PROT 5.9* 5.9*  ALBUMIN 3.1* 3.0*    CBG: No results for input(s): GLUCAP in the last 168 hours.   Recent Results (from the past 240 hours)  Urine Culture     Status: Abnormal   Collection Time: 03/23/24  6:45 PM   Specimen: Urine, Clean Catch  Result Value Ref Range Status   Specimen Description URINE, CLEAN CATCH  Final   Special Requests NONE  Final   Culture (A)  Final    >=100,000 COLONIES/mL AEROCOCCUS  SPECIES Standardized susceptibility testing for this organism is not available. Performed at [facility], [address], [place], [place] [address]    Report Status 03/26/2024 FINAL  Final  Blood culture (routine x 2)     Status: None (Preliminary result)   Collection Time: 03/24/24 11:44 AM   Specimen: BLOOD LEFT ARM  Result Value Ref Range Status   Specimen Description BLOOD LEFT ARM  Final   Special Requests   Final    BOTTLES DRAWN AEROBIC AND ANAEROBIC Blood Culture results may not be optimal due to an inadequate volume of blood received in culture bottles   Culture   Final    NO GROWTH 3 DAYS Performed at [facility], [address], [place], [place] [address]    Report Status PENDING  Incomplete  Blood culture (routine x 2)     Status: None (Preliminary result)   Collection Time: 03/24/24 11:50 AM   Specimen: BLOOD RIGHT ARM  Result Value Ref Range Status   Specimen Description BLOOD RIGHT ARM  Final   Special Requests   Final    BOTTLES DRAWN AEROBIC AND ANAEROBIC Blood Culture results may not be optimal due to an inadequate volume of blood received in culture bottles   Culture   Final    NO GROWTH 3 DAYS Performed at [facility], [address], [place], [place] [address]    Report Status PENDING  Incomplete         Radiology Studies: MR LUMBAR SPINE WO CONTRAST Result Date: 03/26/2024 EXAM: MRI THORACIC AND LUMBAR SPINE WITHOUT INTRAVENOUS CONTRAST 03/26/2024 01:01:00 PM TECHNIQUE: Multiplanar multisequence MRI of the thoracic and lumbar spine was performed without the administration of intravenous contrast. COMPARISON: None available. CLINICAL HISTORY: Myelopathy, acute, thoracic spine. Only got Sag sequences on pt. No axials obtained, pt beating on scanner and tripping the table to where it would not run sequences. Pt very uncooperative, RN gave max amount of meds. Pt trying to come off scan table multiple times. Pt needs anes. For further MRI  FINDINGS: BONES AND ALIGNMENT: Normal thoracic spine alignment. Mild anterior height loss of the T7 vertebral body likely chronic and degenerative. Normal lumbar vertebral body heights. 3 mm retrolisthesis of L2 on L3. SPINAL CORD: Within limits of motion artifact and sagittal only imaging, no abnormal spinal cord signal. Conus terminates at approximately T12-L1. SOFT TISSUES: Unremarkable. THORACIC DISC LEVELS: Multilevel thoracic facet arthropathy. No significant disc herniation or spinal canal stenosis in the thoracic spine. At T3-4 there is moderate bilateral neuroforaminal narrowing. LUMBAR DISC LEVELS: Severely limited exam with significant motion artifact and sagittal only acquisition limiting evaluation for degenerative changes and stenosis. Within this limitation: L2-L3: Retrolisthesis, disc bulge and facet arthropathy contribute to likely mild spinal canal stenosis. L3-L4: Modic type 2 degenerative endplate marrow signal changes. No significant disc herniation. L4-L5: Modic type 2 degenerative endplate marrow signal changes. Small central disc protrusion. L5-S1: Left greater than right facet arthropathy contributes to at least moderate left neuroforaminal narrowing. IMPRESSION: 1. Severely limited exam with significant motion artifact and sagittal only acquisition. Patient should return for repeat imaging after anesthesia consultation. 2. Multilevel thoracic facet arthropathy, notable for moderate bilateral neural foraminal narrowing at T3-4. 3. Likely mild spinal canal stenosis at L2-3. 4. At least moderate left neuroforaminal narrowing at L5-S1 . Electronically signed by: Ryan Chess MD 03/26/2024 01:20 PM EDT RP Workstation: [id number]   MR THORACIC SPINE WO CONTRAST Result Date: 03/26/2024 EXAM: MRI THORACIC AND LUMBAR SPINE WITHOUT INTRAVENOUS CONTRAST 03/26/2024 01:01:00 PM TECHNIQUE: Multiplanar multisequence MRI of the thoracic and lumbar spine was performed without the administration of  intravenous contrast. COMPARISON: None available. CLINICAL HISTORY: Myelopathy, acute, thoracic spine. Only got Sag sequences on pt. No axials obtained, pt beating on scanner and tripping the table to where it would not run sequences. Pt very uncooperative, RN gave max amount of meds. Pt trying to come off scan table multiple times. Pt needs anes. For further MRI FINDINGS: BONES AND ALIGNMENT: Normal thoracic spine alignment. Mild anterior height loss of the T7 vertebral body likely chronic and degenerative. Normal lumbar vertebral body heights. 3 mm retrolisthesis of L2 on L3. SPINAL CORD: Within limits of motion artifact  and sagittal only imaging, no abnormal spinal cord signal. Conus terminates at approximately T12-L1. SOFT TISSUES: Unremarkable. THORACIC DISC LEVELS: Multilevel thoracic facet arthropathy. No significant disc herniation or spinal canal stenosis in the thoracic spine. At T3-4 there is moderate bilateral neuroforaminal narrowing. LUMBAR DISC LEVELS: Severely limited exam with significant motion artifact and sagittal only acquisition limiting evaluation for degenerative changes and stenosis. Within this limitation: L2-L3: Retrolisthesis, disc bulge and facet arthropathy contribute to likely mild spinal canal stenosis. L3-L4: Modic type 2 degenerative endplate marrow signal changes. No significant disc herniation. L4-L5: Modic type 2 degenerative endplate marrow signal changes. Small central disc protrusion. L5-S1: Left greater than right facet arthropathy contributes to at least moderate left neuroforaminal narrowing. IMPRESSION: 1. Severely limited exam with significant motion artifact and sagittal only acquisition. Patient should return for repeat imaging after anesthesia consultation. 2. Multilevel thoracic facet arthropathy, notable for moderate bilateral neural foraminal narrowing at T3-4. 3. Likely mild spinal canal stenosis at L2-3. 4. At least moderate left neuroforaminal narrowing at L5-S1  . Electronically signed by: Ryan Chess MD 03/26/2024 01:20 PM EDT RP Workstation: [id number]   MR CERVICAL SPINE WO CONTRAST Result Date: 03/25/2024 CLINICAL DATA:  Initial evaluation for acute myelopathy. EXAM: MRI CERVICAL SPINE WITHOUT CONTRAST TECHNIQUE: Multiplanar, multisequence MR imaging of the cervical spine was performed. No intravenous contrast was administered. COMPARISON:  CT from earlier the same day FINDINGS: Alignment: Examination technically limited as the patient was unable to tolerate the full length of the exam. Sagittal imaging only was performed. No axial sequences were obtained. Additionally, provided images are degraded by motion artifact. Straightening of the normal cervical lordosis. Trace degenerative retrolisthesis of C3 on C4, with trace anterolisthesis of C6 on C7. Vertebrae: Vertebral body height maintained without acute or chronic fracture. Bone marrow signal intensity overall within normal limits. No visible worrisome osseous lesions. Suspected mild reactive marrow edema about the C3-4 facets due to facet arthritis. No other abnormal marrow edema. Cord: Grossly normal signal and morphology on this limited motion degraded exam. Posterior Fossa, vertebral arteries, paraspinal tissues: No visible abnormality on this limited exam. Disc levels: C2-C3: Mild disc bulge. Mild facet hypertrophy. No significant stenosis. C3-C4: Diffuse disc bulge with bilateral uncovertebral spurring. Mild to moderate spinal stenosis. Foramina not well assessed on this exam. C4-C5: Diffuse disc bulge with uncovertebral spurring. No significant spinal stenosis. Foramina appear grossly patent. C5-C6: Mild disc bulge with endplate and uncovertebral spurring. No significant stenosis. C6-C7: Degenerative intervertebral disc space narrowing with diffuse disc osteophyte complex. No significant spinal stenosis. No more than mild bilateral foraminal stenosis. C7-T1: Mild disc bulge. Bilateral facet arthrosis. No  significant canal or foraminal stenosis. IMPRESSION: 1. Technically limited exam due to the patient's inability to tolerate the study. Sagittal imaging only was performed. No axial sequences were obtained. Additionally, provided images are degraded by motion. 2. Grossly normal MRI appearance of the cervical spinal cord on this limited exam. 3. Multilevel cervical spondylosis with resultant mild to moderate spinal stenosis at C3-4. No other significant spinal stenosis. Evaluation of the neural foramina limited on this truncated exam. Electronically Signed   By: Morene Hoard M.D.   On: 03/25/2024 21:36        Scheduled Meds:  apixaban   5 mg Oral BID   carbidopa -levodopa   1 tablet Oral 6 times per day   Chlorhexidine  Gluconate Cloth  6 each Topical Daily   cyanocobalamin   1,000 mcg Subcutaneous Daily   Followed by   NOREEN ON 04/02/2024] cyanocobalamin   1,000  mcg Subcutaneous Q30 days   phosphorus  500 mg Oral TID   senna-docusate  1 tablet Oral BID   sodium chloride  flush  3 mL Intravenous Q12H   [START ON 04/03/2024] thiamine  (VITAMIN B1) injection  100 mg Intravenous Daily   Continuous Infusions:  cefTRIAXone  (ROCEPHIN )  IV Stopped (03/26/24 2030)   lactated ringers  125 mL/hr at 03/27/24 1132   [START ON 03/28/2024] thiamine  (VITAMIN B1) injection       LOS: 3 days       Brayton Lye, MD [facility]   To contact the attending provider between 7A-7P or the covering provider during after hours 7P-7A, please log into the web site [email or web address] and access using universal [place] password for that web site. If you do not have the password, please call the hospital operator.  03/27/2024, 1:57 PM

## 2024-03-27 NOTE — Progress Notes (Signed)
 NEUROLOGY CONSULT FOLLOW UP NOTE   Date of service: March 27, 2024 Patient Name: John [name] MRN:  [id number] DOB:  1961-05-23  Interval Hx/subjective   More confused today Still tolerated only a few sequences of MRI scan  Multiple episodes of soft stool bowel incontinence today  Vitals   Vitals:   03/27/24 0400 03/27/24 0727 03/27/24 1134 03/27/24 1543  BP: 123/83 (!) 143/86 (!) 134/97   Pulse: (!) 106 100 92   Resp: (!) 26 19 20    Temp: 98.2 F (36.8 C) 99.1 F (37.3 C) 97.9 F (36.6 C) 97.9 F (36.6 C)  TempSrc:   Oral Oral  SpO2: 97% 100% 95%   Weight:      Height:         Body mass index is 33.4 kg/m.  Physical Exam   Constitutional: Appears well, trying to remove mitts with his teeth reportedly to wipe his eye Psych: Calm, cooperative with exam HENT: No OP obstruction. Multiple areas of contusions, bruising, 2 lacerations on the front of his face.   Cardiovascular: RRR  Respiratory: No distress, regular unlabored GI: Soft.  No distension. There is no tenderness.  GU: Good rectal tone  Skin: WDI.   Neurologic Examination   Neuro: Mental Status: Awake, follows commands, but oriented only to self at the time of my exam Cranial Nerves: II: PERRL III,IV, VI: EOMI without diplopia V: Facial sensation is symmetric to light touch VII: Facial movement is symmetric resting and smiling VIII: Hearing is intact to voice XII: Tongue protrudes midline without atrophy or fasciculations.  Motor: 5/5 throughout upper extremities and lower extremities today Sensory: Sensation is symmetric to light touch in the arms and legs. Reflexes: 3+ bilateral brachioradialis and 3+ bilateral patellae, positive jaw jerk  Medications  Current Facility-Administered Medications:    acetaminophen  (TYLENOL ) tablet 650 mg, 650 mg, Oral, Q6H PRN, 650 mg at 03/27/24 0534 **OR** acetaminophen  (TYLENOL ) suppository 650 mg, 650 mg, Rectal, Q6H PRN, Claudene Reeves A, MD, 650 mg at  03/24/24 2329   albuterol  (PROVENTIL ) (2.5 MG/3ML) 0.083% nebulizer solution 2.5 mg, 2.5 mg, Nebulization, Q6H PRN, Claudene, Rondell A, MD   apixaban  (ELIQUIS ) tablet 5 mg, 5 mg, Oral, BID, Elgergawy, Dawood S, MD, 5 mg at 03/27/24 0859   carbidopa -levodopa  (SINEMET  CR) 50-200 MG per tablet controlled release 1 tablet, 1 tablet, Oral, 6 times per day, Claudene Reeves A, MD, 1 tablet at 03/27/24 1533   cefTRIAXone  (ROCEPHIN ) 1 g in sodium chloride  0.9 % 100 mL IVPB, 1 g, Intravenous, Q24H, Smith, Rondell A, MD, Stopped at 03/26/24 2030   Chlorhexidine  Gluconate Cloth 2 % PADS 6 each, 6 each, Topical, Daily, Smith, Rondell A, MD, 6 each at 03/27/24 0900   cyanocobalamin  (VITAMIN B12) injection 1,000 mcg, 1,000 mcg, Subcutaneous, Daily, 1,000 mcg at 03/27/24 1533 **FOLLOWED BY** [START ON 04/02/2024] cyanocobalamin  (VITAMIN B12) injection 1,000 mcg, 1,000 mcg, Subcutaneous, Q30 days, Elgergawy, Brayton RAMAN, MD   lactated ringers  infusion, , Intravenous, Continuous, Plunkett, Whitney, MD, Last Rate: 125 mL/hr at 03/27/24 1132, New Bag at 03/27/24 1132   LORazepam  (ATIVAN ) injection 0.5 mg, 0.5 mg, Intravenous, Q6H PRN, Claudene, Rondell A, MD, 0.5 mg at 03/25/24 1956   ondansetron  (ZOFRAN ) tablet 4 mg, 4 mg, Oral, Q6H PRN **OR** ondansetron  (ZOFRAN ) injection 4 mg, 4 mg, Intravenous, Q6H PRN, Smith, Rondell A, MD   phosphorus (K PHOS  NEUTRAL) tablet 500 mg, 500 mg, Oral, TID, Elgergawy, Dawood S, MD, 500 mg at 03/27/24 1538   senna-docusate (Senokot-S)  tablet 1 tablet, 1 tablet, Oral, BID, Elgergawy, Dawood S, MD, 1 tablet at 03/27/24 0859   sodium chloride  flush (NS) 0.9 % injection 3 mL, 3 mL, Intravenous, Q12H, Smith, Rondell A, MD, 3 mL at 03/27/24 0900   [COMPLETED] thiamine  (VITAMIN B1) 500 mg in sodium chloride  0.9 % 50 mL IVPB, 500 mg, Intravenous, Q8H, Last Rate: 110 mL/hr at 03/27/24 0530, 500 mg at 03/27/24 0530 **FOLLOWED BY** [START ON 03/28/2024] thiamine  (VITAMIN B1) 250 mg in sodium chloride  0.9 % 50  mL IVPB, 250 mg, Intravenous, Daily **FOLLOWED BY** [START ON 04/03/2024] thiamine  (VITAMIN B1) injection 100 mg, 100 mg, Intravenous, Daily, Ashok Sawaya L, MD   vitamin E  capsule 400 Units, 400 Units, Oral, Daily, Elgergawy, Brayton RAMAN, MD, 400 Units at 03/27/24 1532   zinc  sulfate (50mg  elemental zinc ) capsule 220 mg, 220 mg, Oral, Daily, Elgergawy, Brayton RAMAN, MD, 220 mg at 03/27/24 1533  Labs and Diagnostic Imaging   CBC:  Recent Labs  Lab 03/24/24 0707 03/25/24 0624 03/26/24 0628 03/27/24 0624  WBC 9.2   < > 5.5 5.5  NEUTROABS 6.7  --   --   --   HGB 8.9*   < > 8.0* 8.1*  HCT 28.7*   < > 25.6* 25.0*  MCV 87.8   < > 87.4 85.6  PLT 304   < > 277 298   < > = values in this interval not displayed.    Basic Metabolic Panel:  Lab Results  Component Value Date   NA 139 03/27/2024   K 3.7 03/27/2024   CO2 20 (L) 03/27/2024   GLUCOSE 89 03/27/2024   BUN 7 (L) 03/27/2024   CREATININE 0.93 03/27/2024   CALCIUM 8.2 (L) 03/27/2024   GFRNONAA >60 03/27/2024   GFRAA 88 03/13/2020   Lipid Panel:  Lab Results  Component Value Date   LDLCALC 73 12/10/2023   Alcohol Level     Component Value Date/Time   ETH <15 03/16/2024 2143   INR  Lab Results  Component Value Date   INR 1.3 (H) 03/23/2024   Folate - 10.6 Copper  - mild elevation 135 (ref 69 - 132) Zinc  - 57 B6  B3 B1 Vitamin E  MMA  B12- 206  CT Head without contrast 7/9 and 7/10 (Personally reviewed): No acute intracranial abnormality. Patchy chronic small vessel disease throughout the deep white matter.  CT C- spine 7/9: No acute bony abnormality. Cervical spondylosis with up to moderate to severe stenosis at C3/4   MRI C-spine 7/10 1. Technically limited exam due to the patient's inability to tolerate the study. Sagittal imaging only was performed. No axial sequences were obtained. Additionally, provided images are degraded by motion. 2. Grossly normal MRI appearance of the cervical spinal cord on  this limited exam. 3. Multilevel cervical spondylosis with resultant mild to moderate spinal stenosis at C3-4. No other significant spinal stenosis. Evaluation of the neural foramina limited on this truncated exam.  MRI L-spine and T-spine 1. Severely limited exam with significant motion artifact and sagittal only acquisition. Patient should return for repeat imaging after anesthesia consultation. 2. Multilevel thoracic facet arthropathy, notable for moderate bilateral neural foraminal narrowing at T3-4. 3. Likely mild spinal canal stenosis at L2-3. 4. At least moderate left neuroforaminal narrowing at L5-S1 .  rEEG: 7/9  IMPRESSION: This study is within normal limits. No seizures or epileptiform discharges were seen throughout the recording. A normal interictal EEG does not exclude the diagnosis of epilepsy.    Assessment  BENEDICTO CAPOZZI is a 63 y.o. male with a past medical history of parkinson's disease, HTN, recent PE now on eliquis  presenting after multiple falls and altered mental status.  Significant gradual decline consistent with progressive Parkinson's disease.  Unable to safely manage medications at home or maintain home in a safe living condition.  Some possible cognitive decline as well although was still holding down his job at [employer] until disability recently approved within the past 2 months or so.  No severe compressive process on spinal cord imaging and reassuringly exam remains with good strength, stable mild hyperrflexia, intact rectal tone despite incontinience. With positive jaw jerk reflexes may also be more due to global encephalopathy rather than cord process through certainly c-spine stenosis could be playing a role.   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 - Potential for progression of degenerative disc disease - Potential  postconcussive syndrome - Unlikely underlying seizure, though no clear description of seizures - Delirium in the setting of pain, hospitalization, head injury, medications   Recommendations  - TED stockings and abdominal binder to be used only before ambulation (remove when at rest) - Low normal B12 at 206, MMA normal but still recommend goal > 600 - Follow up pending labs:  Vitamin E , B1, B3, B6, supplement as needed - Please continue carbidopa /levodopa  at his home dose - Empiric thiamine  supplementation pending B1 result (Wernicke's dosing used given high benefit, low risk) - Neurology will sign off at this time, discussed with primary via secure chat ______________________________________________________________________   Lola Jernigan MD-PhD [facility] [phone]  Available 7 AM to 7 PM, outside these hours please contact Neurologist on call listed on AMION

## 2024-03-27 NOTE — Telephone Encounter (Signed)
 Post ED Visit - Positive Culture Follow-up  Culture report reviewed by antimicrobial stewardship pharmacist: [facility] []  Rankin Dee, Pharm.D. []  Venetia Gully, Pharm.D., BCPS AQ-ID []  Garrel Crews, Pharm.D., BCPS []  Almarie Lunger, [address].D., BCPS []  [place], [address].D., BCPS, AAHIVP []  Rosaline Bihari, Pharm.D., BCPS, AAHIVP []  Vernell Meier, PharmD, BCPS []  Latanya Hint, PharmD, BCPS []  Donald Medley, PharmD, BCPS []  Rocky Bold, PharmD []  Dorothyann Alert, PharmD, BCPS [x]  Dorn Poot, PharmD  [facility] []  Rosaline Edison, PharmD []  Romona Bliss, PharmD []  Dolphus Roller, PharmD []  Veva Seip, Rph []  Vernell Daunt) Leonce, PharmD []  Eva Allis, PharmD []  Rosaline Millet, PharmD []  Iantha Batch, PharmD []  Arvin Gauss, PharmD []  Wanda Hasting, PharmD []  Ronal Rav, PharmD []  Rocky Slade, PharmD []  Bard Jeans, PharmD   Positive urine culture Treated with Cephalexin , organism sensitive to the same and no further patient follow-up is required at this time.  Albino Alan Novak 03/27/2024, 10:32 AM

## 2024-03-27 NOTE — Plan of Care (Signed)

## 2024-03-27 NOTE — TOC Progression Note (Signed)
 Transition of Care [facility]) - Progression Note    Patient Details  Name: UNDREA SHIPES MRN: [id number] Date of Birth: 19-Dec-1960  Transition of [facility]) CM/SW Contact  [name], LCSW Phone Number: 03/27/2024, 1:01 PM  Clinical Narrative:    CSW provided update to patient's sister.    Expected Discharge Plan: Skilled Nursing Facility Barriers to Discharge: Continued Medical Work up, English as a second language teacher, SNF Pending bed offer  Expected Discharge Plan and Services In-house Referral: Clinical Social Work   Post Acute Care Choice: Skilled Nursing Facility Living arrangements for the past 2 months: Single Family Home, Skilled Nursing Facility                                       Social Determinants of Health (SDOH) Interventions SDOH Screenings   Food Insecurity: Patient Unable To Answer (03/25/2024)  Housing: Unknown (03/25/2024)  Transportation Needs: Patient Unable To Answer (03/25/2024)  Utilities: Patient Unable To Answer (03/25/2024)  Depression (PHQ2-9): Low Risk  (12/10/2023)  Tobacco Use: Medium Risk (03/17/2024)    Readmission Risk Interventions     No data to display

## 2024-03-28 ENCOUNTER — Inpatient Hospital Stay (HOSPITAL_COMMUNITY)

## 2024-03-28 DIAGNOSIS — R296 Repeated falls: Secondary | ICD-10-CM | POA: Diagnosis not present

## 2024-03-28 LAB — BASIC METABOLIC PANEL WITH GFR
Anion gap: 9 (ref 5–15)
BUN: <5 mg/dL — ABNORMAL LOW (ref 8–23)
CO2: 20 mmol/L — ABNORMAL LOW (ref 22–32)
Calcium: 8.3 mg/dL — ABNORMAL LOW (ref 8.9–10.3)
Chloride: 107 mmol/L (ref 98–111)
Creatinine, Ser: 0.88 mg/dL (ref 0.61–1.24)
GFR, Estimated: >60 mL/min (ref >60)
Glucose, Bld: 92 mg/dL (ref 70–99)
Potassium: 3.7 mmol/L (ref 3.5–5.1)
Sodium: 136 mmol/L (ref 135–145)

## 2024-03-28 LAB — CBC
HCT: 26 % — ABNORMAL LOW (ref 39.0–52.0)
Hemoglobin: 8.3 g/dL — ABNORMAL LOW (ref 13.0–17.0)
MCH: 27.5 pg (ref 26.0–34.0)
MCHC: 31.9 g/dL (ref 30.0–36.0)
MCV: 86.1 fL (ref 80.0–100.0)
Platelets: 312 K/uL (ref 150–400)
RBC: 3.02 MIL/uL — ABNORMAL LOW (ref 4.22–5.81)
RDW: 14.6 % (ref 11.5–15.5)
WBC: 5.1 K/uL (ref 4.0–10.5)
nRBC: 0 % (ref 0.0–0.2)

## 2024-03-28 LAB — PHOSPHORUS: Phosphorus: 2.5 mg/dL (ref 2.5–4.6)

## 2024-03-28 LAB — MAGNESIUM: Magnesium: 2.1 mg/dL (ref 1.7–2.4)

## 2024-03-28 LAB — VITAMIN B6: Vitamin B6: 2.6 ug/L — ABNORMAL LOW (ref 3.4–65.2)

## 2024-03-28 MED ORDER — HALOPERIDOL LACTATE 5 MG/ML IJ SOLN: 2.0000 mg | Freq: Four times a day (QID) | INTRAMUSCULAR | Status: DC | PRN

## 2024-03-28 MED ORDER — QUETIAPINE FUMARATE 50 MG PO TABS
50.0000 mg | ORAL_TABLET | Freq: Every day | ORAL | Status: DC
  Administered 2024-03-28: 50 mg via ORAL
  Filled 2024-03-28: qty 1

## 2024-03-28 MED ORDER — LORAZEPAM 2 MG/ML IJ SOLN
1.0000 mg | Freq: Once | INTRAMUSCULAR | Status: AC | PRN
Start: 2024-03-28 — End: 2024-03-28
  Administered 2024-03-28: 1 mg via INTRAVENOUS

## 2024-03-28 MED ORDER — HALOPERIDOL LACTATE 5 MG/ML IJ SOLN: 1.0000 mg | Freq: Four times a day (QID) | INTRAMUSCULAR | Status: DC | PRN

## 2024-03-28 MED ORDER — LORAZEPAM 2 MG/ML IJ SOLN
0.5000 mg | Freq: Once | INTRAMUSCULAR | Status: AC
  Administered 2024-03-28: 0.5 mg via INTRAVENOUS
  Filled 2024-03-28: qty 1

## 2024-03-28 MED ORDER — LORAZEPAM 2 MG/ML IJ SOLN
1.0000 mg | Freq: Once | INTRAMUSCULAR | Status: AC | PRN
  Administered 2024-03-29: 1 mg via INTRAVENOUS
  Filled 2024-03-28: qty 1

## 2024-03-28 NOTE — Progress Notes (Signed)
 PROGRESS NOTE    John [name]  FMW:[id number] DOB: 10-30-1960 DOA: 03/24/2024 PCP: [name], MD   Chief Complaint  Patient presents with   Fall    Brief Narrative:    John [name] is a 63 y.o. male with medical history significant of hypertension, CVA, Parkinson's disease, recent hospitalization within the last month, most recently end of June for acute PE, started on Eliquis , and another admission just discharged 7/7, after presenting with a concern for seizure with AKI and hyponatremia which resolved.  Patient was sent to SNF, presents to ED secondary to fall with facial trauma . - In ED patient was noted to be afebrile, but later developed fever 101.6,  While in the ED patient had been noted to have fallen out of bed and  ripped out his Foley catheter which was able to be replaced.    Assessment & Plan:   Principal Problem:   Frequent falls Active Problems:   Laceration of head   Urinary tract infection   Acute metabolic encephalopathy   Hypomagnesemia   History of pulmonary embolism   Parkinson's disease with fluctuating manifestations (HCC)   BPH with urinary obstruction   Obesity (BMI 30-39.9)   Frequent falls Laceration to forehead - Patient has a history of gait disturbance with frequent falls likely related to his history of Parkinson disease.   -CT scan of the cervical spine noted concern for cervical spinal stenosis. . -MRI cervical spine, thoracic spine, lumbar spine has been obtained by neurology, overall but per neurology there is no severe compressive process on spinal cord imaging, and his physical exam is reassuring with good strength . - Other labs has been checked for myelopathic and neuropathic etiology, 12 is borderline, started on supplement, he is on zinc  and vitamin EE given borderline levels . -High-dose thiamine  empirically, B1 level is pending - Following B3, B6 levels. 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   Laceration of the head -Sutured, he is on Rocephin  mainly for UTI which should cover as well, will need sutures removed in 10 days from application (7/18)   Urinary tract infection - Prior to arrival.   - Continue Rocephin    Acute metabolic encephalopathy -CT head 7/8, 7/9, 7/10 with no acute findings but significant for patchy small vessel disease throughout the deep white matter. - Resume B12, zinc  and vitamin D supplements. - Developing Parkinson dementia prerenal. - Underlying acute process contributing to worsening dementia explaining acute deterioration in matter of few weeks including UTI, recurrent hospitalization with hospital delirium, as well likely postconcussive disease. - MRI brain with without contrast is pending, hopefully he will be able to sit still for the study. -Borderline B12 level, started on IM supplements - follow on myelopathy zinc , copper , folate, Vitamin E , vitamin D and zinc  borderline low so started on supplement, folate within normal limit, - follow on nutritional causes for altered mental status: B1, B3, B6 -Significant agitation and restlessness overnight, so we will try low-dose Seroquel  at nighttime.,  On as needed Haldol    Hypomagnesemia Hypophosphatemia -Replaced   Pulmonary embolism Patient found to have bilateral pulmonary embolism during previous hospitalization - Continue Eliquis    Parkinson's disease - Continue Sinemet    BPH with urinary obstruction - Failed voiding trial yesterday, will reinsert Foley catheter   Obesity BMI 33.4 kg/m  Hypokalemia Hypophosphatemia -Replaced   DVT  prophylaxis: Eliquis  Code Status: Full Family Communication: None at bedside, discussed with sister by phone Disposition:   Status is: Inpatient    Consultants:  neurology   Subjective:  Is with agitation and restlessness  overnight, required as needed Ativan  Objective: Vitals:   03/27/24 1543 03/27/24 1929 03/28/24 0604 03/28/24 0801  BP:  130/81 (!) 139/99 113/61  Pulse:    (!) 106  Resp:    16  Temp: 97.9 F (36.6 C) 97.9 F (36.6 C) 98.7 F (37.1 C) 98.9 F (37.2 C)  TempSrc: Oral Oral Oral Axillary  SpO2:    98%  Weight:      Height:        Intake/Output Summary (Last 24 hours) at 03/28/2024 1229 Last data filed at 03/28/2024 1100 Gross per 24 hour  Intake 240 ml  Output 3300 ml  Net -3060 ml   Filed Weights   03/24/24 0706  Weight: 118 kg    Examination:    Awake, alert, used to be more confused and less conversant today, slow speech .  Significant facial bruising and forehead laceration. Good air entry bilaterally Abdomen soft Regular rate and rhythm Extremities no edema, has multiple bruising in the body from previous falls   Picture on 7/10   Data Reviewed: I have personally reviewed following labs and imaging studies  CBC: Recent Labs  Lab 03/24/24 0707 03/25/24 0624 03/26/24 0628 03/27/24 0624 03/28/24 0742  WBC 9.2 7.1 5.5 5.5 5.1  NEUTROABS 6.7  --   --   --   --   HGB 8.9* 8.2* 8.0* 8.1* 8.3*  HCT 28.7* 26.3* 25.6* 25.0* 26.0*  MCV 87.8 86.5 87.4 85.6 86.1  PLT 304 277 277 298 312    Basic Metabolic Panel: Recent Labs  Lab 03/24/24 0707 03/25/24 0624 03/26/24 0628 03/27/24 0624 03/28/24 0742  NA 138 142 140 139 136  K 3.5 3.5 3.3* 3.7 3.7  CL 110 111 110 111 107  CO2 19* 23 20* 20* 20*  GLUCOSE 122* 92 92 89 92  BUN 14 10 10  7* <5*  CREATININE 1.12 1.40* 0.93 0.93 0.88  CALCIUM 8.7* 8.6* 8.1* 8.2* 8.3*  MG 1.6* 1.7 1.8 1.6* 2.1  PHOS  --  2.4* 2.3* 2.4* 2.5    GFR: Estimated Creatinine Clearance: 117.3 mL/min (by C-G formula based on SCr of 0.88 mg/dL).  Liver Function Tests: Recent Labs  Lab 03/23/24 1517 03/24/24 0707  AST 19 17  ALT 6 <5  ALKPHOS 88 83  BILITOT 0.8 1.2  PROT 5.9* 5.9*  ALBUMIN 3.1* 3.0*    CBG: No results  for input(s): GLUCAP in the last 168 hours.   Recent Results (from the past 240 hours)  Urine Culture     Status: Abnormal   Collection Time: 03/23/24  6:45 PM   Specimen: Urine, Clean Catch  Result Value Ref Range Status   Specimen Description URINE, CLEAN CATCH  Final   Special Requests NONE  Final   Culture (A)  Final    >=100,000 COLONIES/mL AEROCOCCUS SPECIES Standardized susceptibility testing for this organism is not available. Performed at [facility], [address], [place], [place] [address]    Report Status 03/26/2024 FINAL  Final  Blood culture (routine x 2)     Status: None (Preliminary result)   Collection Time: 03/24/24 11:44 AM   Specimen: BLOOD LEFT ARM  Result Value Ref Range Status   Specimen Description BLOOD LEFT ARM  Final   Special Requests  Final    BOTTLES DRAWN AEROBIC AND ANAEROBIC Blood Culture results may not be optimal due to an inadequate volume of blood received in culture bottles   Culture   Final    NO GROWTH 4 DAYS Performed at [facility], [address], [place], [place] [address]    Report Status PENDING  Incomplete  Blood culture (routine x 2)     Status: None (Preliminary result)   Collection Time: 03/24/24 11:50 AM   Specimen: BLOOD RIGHT ARM  Result Value Ref Range Status   Specimen Description BLOOD RIGHT ARM  Final   Special Requests   Final    BOTTLES DRAWN AEROBIC AND ANAEROBIC Blood Culture results may not be optimal due to an inadequate volume of blood received in culture bottles   Culture   Final    NO GROWTH 4 DAYS Performed at [facility], [address] [place], [place] [address]    Report Status PENDING  Incomplete         Radiology Studies: MR LUMBAR SPINE WO CONTRAST Result Date: 03/26/2024 EXAM: MRI THORACIC AND LUMBAR SPINE WITHOUT INTRAVENOUS CONTRAST 03/26/2024 01:01:00 PM TECHNIQUE: Multiplanar multisequence MRI of the thoracic and lumbar spine was performed without the administration  of intravenous contrast. COMPARISON: None available. CLINICAL HISTORY: Myelopathy, acute, thoracic spine. Only got Sag sequences on pt. No axials obtained, pt beating on scanner and tripping the table to where it would not run sequences. Pt very uncooperative, RN gave max amount of meds. Pt trying to come off scan table multiple times. Pt needs anes. For further MRI FINDINGS: BONES AND ALIGNMENT: Normal thoracic spine alignment. Mild anterior height loss of the T7 vertebral body likely chronic and degenerative. Normal lumbar vertebral body heights. 3 mm retrolisthesis of L2 on L3. SPINAL CORD: Within limits of motion artifact and sagittal only imaging, no abnormal spinal cord signal. Conus terminates at approximately T12-L1. SOFT TISSUES: Unremarkable. THORACIC DISC LEVELS: Multilevel thoracic facet arthropathy. No significant disc herniation or spinal canal stenosis in the thoracic spine. At T3-4 there is moderate bilateral neuroforaminal narrowing. LUMBAR DISC LEVELS: Severely limited exam with significant motion artifact and sagittal only acquisition limiting evaluation for degenerative changes and stenosis. Within this limitation: L2-L3: Retrolisthesis, disc bulge and facet arthropathy contribute to likely mild spinal canal stenosis. L3-L4: Modic type 2 degenerative endplate marrow signal changes. No significant disc herniation. L4-L5: Modic type 2 degenerative endplate marrow signal changes. Small central disc protrusion. L5-S1: Left greater than right facet arthropathy contributes to at least moderate left neuroforaminal narrowing. IMPRESSION: 1. Severely limited exam with significant motion artifact and sagittal only acquisition. Patient should return for repeat imaging after anesthesia consultation. 2. Multilevel thoracic facet arthropathy, notable for moderate bilateral neural foraminal narrowing at T3-4. 3. Likely mild spinal canal stenosis at L2-3. 4. At least moderate left neuroforaminal narrowing at  L5-S1 . Electronically signed by: Ryan Chess MD 03/26/2024 01:20 PM EDT RP Workstation: [id number]   MR THORACIC SPINE WO CONTRAST Result Date: 03/26/2024 EXAM: MRI THORACIC AND LUMBAR SPINE WITHOUT INTRAVENOUS CONTRAST 03/26/2024 01:01:00 PM TECHNIQUE: Multiplanar multisequence MRI of the thoracic and lumbar spine was performed without the administration of intravenous contrast. COMPARISON: None available. CLINICAL HISTORY: Myelopathy, acute, thoracic spine. Only got Sag sequences on pt. No axials obtained, pt beating on scanner and tripping the table to where it would not run sequences. Pt very uncooperative, RN gave max amount of meds. Pt trying to come off scan table multiple times. Pt needs  anes. For further MRI FINDINGS: BONES AND ALIGNMENT: Normal thoracic spine alignment. Mild anterior height loss of the T7 vertebral body likely chronic and degenerative. Normal lumbar vertebral body heights. 3 mm retrolisthesis of L2 on L3. SPINAL CORD: Within limits of motion artifact and sagittal only imaging, no abnormal spinal cord signal. Conus terminates at approximately T12-L1. SOFT TISSUES: Unremarkable. THORACIC DISC LEVELS: Multilevel thoracic facet arthropathy. No significant disc herniation or spinal canal stenosis in the thoracic spine. At T3-4 there is moderate bilateral neuroforaminal narrowing. LUMBAR DISC LEVELS: Severely limited exam with significant motion artifact and sagittal only acquisition limiting evaluation for degenerative changes and stenosis. Within this limitation: L2-L3: Retrolisthesis, disc bulge and facet arthropathy contribute to likely mild spinal canal stenosis. L3-L4: Modic type 2 degenerative endplate marrow signal changes. No significant disc herniation. L4-L5: Modic type 2 degenerative endplate marrow signal changes. Small central disc protrusion. L5-S1: Left greater than right facet arthropathy contributes to at least moderate left neuroforaminal narrowing. IMPRESSION: 1.  Severely limited exam with significant motion artifact and sagittal only acquisition. Patient should return for repeat imaging after anesthesia consultation. 2. Multilevel thoracic facet arthropathy, notable for moderate bilateral neural foraminal narrowing at T3-4. 3. Likely mild spinal canal stenosis at L2-3. 4. At least moderate left neuroforaminal narrowing at L5-S1 . Electronically signed by: Ryan Chess MD 03/26/2024 01:20 PM EDT RP Workstation: [id number]        Scheduled Meds:  apixaban   5 mg Oral BID   carbidopa -levodopa   1 tablet Oral 6 times per day   Chlorhexidine  Gluconate Cloth  6 each Topical Daily   cyanocobalamin   1,000 mcg Subcutaneous Daily   Followed by   NOREEN ON 04/02/2024] cyanocobalamin   1,000 mcg Subcutaneous Q30 days   QUEtiapine   50 mg Oral QHS   senna-docusate  1 tablet Oral BID   sodium chloride  flush  3 mL Intravenous Q12H   [START ON 04/03/2024] thiamine  (VITAMIN B1) injection  100 mg Intravenous Daily   vitamin E   400 Units Oral Daily   zinc  sulfate (50mg  elemental zinc )  220 mg Oral Daily   Continuous Infusions:  cefTRIAXone  (ROCEPHIN )  IV 1 g (03/27/24 2042)   lactated ringers  125 mL/hr at 03/28/24 0452   thiamine  (VITAMIN B1) injection       LOS: 4 days       Brayton Lye, MD [facility]   To contact the attending provider between 7A-7P or the covering provider during after hours 7P-7A, please log into the web site [email or web address] and access using universal [place] password for that web site. If you do not have the password, please call the hospital operator.  03/28/2024, 12:29 PM

## 2024-03-28 NOTE — Plan of Care (Signed)

## 2024-03-28 NOTE — Progress Notes (Signed)
 [facility] night cross cover note:   I was notified by the patient's RN that the patient is restless, agitated, anxious, and continues to attempt to pull out leads and lines in spite of mittens.  As needed IV Ativan  has been effective in reducing the patient's anxiety, but RN conveys that the patient is not yet eligible for his next dose of prn IV Ativan .  I subsequently placed a one-time additional order for Ativan  0.5 mg IV x 1 dose now.     Eva Pore, DO Hospitalist

## 2024-03-29 ENCOUNTER — Inpatient Hospital Stay (HOSPITAL_COMMUNITY)

## 2024-03-29 ENCOUNTER — Encounter (HOSPITAL_COMMUNITY): Payer: Self-pay | Admitting: Internal Medicine

## 2024-03-29 ENCOUNTER — Inpatient Hospital Stay (HOSPITAL_COMMUNITY): Admitting: Anesthesiology

## 2024-03-29 ENCOUNTER — Encounter (HOSPITAL_COMMUNITY)
Admission: EM | Disposition: A | Discharge status: Skilled Nursing Facility | Payer: Self-pay | Source: Home / Self Care | Attending: Internal Medicine

## 2024-03-29 DIAGNOSIS — M4802 Spinal stenosis, cervical region: Secondary | ICD-10-CM | POA: Diagnosis not present

## 2024-03-29 DIAGNOSIS — R296 Repeated falls: Secondary | ICD-10-CM

## 2024-03-29 DIAGNOSIS — I6203 Nontraumatic chronic subdural hemorrhage: Secondary | ICD-10-CM

## 2024-03-29 DIAGNOSIS — M47814 Spondylosis without myelopathy or radiculopathy, thoracic region: Secondary | ICD-10-CM

## 2024-03-29 DIAGNOSIS — G20A1 Parkinson's disease without dyskinesia, without mention of fluctuations: Secondary | ICD-10-CM | POA: Diagnosis not present

## 2024-03-29 DIAGNOSIS — M5124 Other intervertebral disc displacement, thoracic region: Secondary | ICD-10-CM | POA: Diagnosis not present

## 2024-03-29 DIAGNOSIS — R410 Disorientation, unspecified: Secondary | ICD-10-CM

## 2024-03-29 DIAGNOSIS — Z87891 Personal history of nicotine dependence: Secondary | ICD-10-CM

## 2024-03-29 DIAGNOSIS — Z86711 Personal history of pulmonary embolism: Secondary | ICD-10-CM

## 2024-03-29 DIAGNOSIS — I10 Essential (primary) hypertension: Secondary | ICD-10-CM

## 2024-03-29 DIAGNOSIS — M549 Dorsalgia, unspecified: Secondary | ICD-10-CM

## 2024-03-29 DIAGNOSIS — S065XAA Traumatic subdural hemorrhage with loss of consciousness status unknown, initial encounter: Secondary | ICD-10-CM

## 2024-03-29 HISTORY — PX: RADIOLOGY WITH ANESTHESIA: SHX6223

## 2024-03-29 HISTORY — DX: Traumatic subdural hemorrhage with loss of consciousness status unknown, initial encounter: S06.5XAA

## 2024-03-29 LAB — CULTURE, BLOOD (ROUTINE X 2)
Culture: NO GROWTH
Culture: NO GROWTH

## 2024-03-29 LAB — CBC
HCT: 28.8 % — ABNORMAL LOW (ref 39.0–52.0)
Hemoglobin: 9 g/dL — ABNORMAL LOW (ref 13.0–17.0)
MCH: 26.9 pg (ref 26.0–34.0)
MCHC: 31.3 g/dL (ref 30.0–36.0)
MCV: 86 fL (ref 80.0–100.0)
Platelets: 342 K/uL (ref 150–400)
RBC: 3.35 MIL/uL — ABNORMAL LOW (ref 4.22–5.81)
RDW: 14.6 % (ref 11.5–15.5)
WBC: 5.5 K/uL (ref 4.0–10.5)
nRBC: 0 % (ref 0.0–0.2)

## 2024-03-29 LAB — BASIC METABOLIC PANEL WITH GFR
Anion gap: 11 (ref 5–15)
BUN: <5 mg/dL — ABNORMAL LOW (ref 8–23)
CO2: 20 mmol/L — ABNORMAL LOW (ref 22–32)
Calcium: 8.6 mg/dL — ABNORMAL LOW (ref 8.9–10.3)
Chloride: 106 mmol/L (ref 98–111)
Creatinine, Ser: 0.95 mg/dL (ref 0.61–1.24)
GFR, Estimated: >60 mL/min (ref >60)
Glucose, Bld: 83 mg/dL (ref 70–99)
Potassium: 3.9 mmol/L (ref 3.5–5.1)
Sodium: 137 mmol/L (ref 135–145)

## 2024-03-29 LAB — PHOSPHORUS: Phosphorus: 2.6 mg/dL (ref 2.5–4.6)

## 2024-03-29 LAB — MAGNESIUM: Magnesium: 1.8 mg/dL (ref 1.7–2.4)

## 2024-03-29 SURGERY — RADIOLOGY WITH ANESTHESIA
Anesthesia: General

## 2024-03-29 MED ORDER — CHLORHEXIDINE GLUCONATE 0.12 % MT SOLN
OROMUCOSAL | Status: AC
  Administered 2024-03-29: 15 mL
  Filled 2024-03-29: qty 15

## 2024-03-29 MED ORDER — MIDAZOLAM HCL 2 MG/2ML IJ SOLN
INTRAMUSCULAR | Status: AC
  Filled 2024-03-29: qty 2

## 2024-03-29 MED ORDER — LIDOCAINE 2% (20 MG/ML) 5 ML SYRINGE
INTRAMUSCULAR | Status: DC | PRN
  Administered 2024-03-29: 100 mg via INTRAVENOUS

## 2024-03-29 MED ORDER — SUGAMMADEX SODIUM 200 MG/2ML IV SOLN
INTRAVENOUS | Status: DC | PRN
  Administered 2024-03-29: 200 mg via INTRAVENOUS

## 2024-03-29 MED ORDER — ROCURONIUM BROMIDE 10 MG/ML (PF) SYRINGE
PREFILLED_SYRINGE | INTRAVENOUS | Status: DC | PRN
  Administered 2024-03-29: 50 mg via INTRAVENOUS
  Administered 2024-03-29: 20 mg via INTRAVENOUS

## 2024-03-29 MED ORDER — PHENYLEPHRINE HCL-NACL 20-0.9 MG/250ML-% IV SOLN
INTRAVENOUS | Status: DC | PRN
  Administered 2024-03-29: 20 ug/min via INTRAVENOUS

## 2024-03-29 MED ORDER — PROPOFOL 10 MG/ML IV BOLUS
INTRAVENOUS | Status: DC | PRN
  Administered 2024-03-29: 200 mg via INTRAVENOUS

## 2024-03-29 MED ORDER — ONDANSETRON HCL 4 MG/2ML IJ SOLN
INTRAMUSCULAR | Status: DC | PRN
  Administered 2024-03-29: 4 mg via INTRAVENOUS

## 2024-03-29 MED ORDER — DEXAMETHASONE SODIUM PHOSPHATE 10 MG/ML IJ SOLN
INTRAMUSCULAR | Status: DC | PRN
  Administered 2024-03-29: 10 mg via INTRAVENOUS

## 2024-03-29 MED ORDER — PHENYLEPHRINE 80 MCG/ML (10ML) SYRINGE FOR IV PUSH (FOR BLOOD PRESSURE SUPPORT)
PREFILLED_SYRINGE | INTRAVENOUS | Status: DC | PRN
  Administered 2024-03-29: 160 ug via INTRAVENOUS
  Administered 2024-03-29: 80 ug via INTRAVENOUS
  Administered 2024-03-29: 160 ug via INTRAVENOUS

## 2024-03-29 MED ORDER — FENTANYL CITRATE (PF) 100 MCG/2ML IJ SOLN
INTRAMUSCULAR | Status: AC
  Filled 2024-03-29: qty 2

## 2024-03-29 MED ORDER — SUCCINYLCHOLINE CHLORIDE 200 MG/10ML IV SOSY
PREFILLED_SYRINGE | INTRAVENOUS | Status: DC | PRN
  Administered 2024-03-29: 120 mg via INTRAVENOUS

## 2024-03-29 MED ORDER — GADOBUTROL 1 MMOL/ML IV SOLN
10.0000 mL | Freq: Once | INTRAVENOUS | Status: AC | PRN
  Administered 2024-03-29: 10 mL via INTRAVENOUS

## 2024-03-29 NOTE — Plan of Care (Signed)
 Patient confused, mumbling incomprehensible speech.  Legs partially hanging across bed rails.  Fall mats beside bed.  Patient repositioned in bed during bedside shift report.  HOB elevated.  Foley intact. PIV protected with gauze dressing.  Side rails up times four.  Reorientation provided to patient.  NAD noted with patient. Door remains open for frequent observation of patient. Bed alarm set. Problem: Safety: Goal: Non-violent Restraint(s) Outcome: Progressing   Problem: Education: Goal: Knowledge of General Education information will improve Description: Including pain rating scale, medication(s)/side effects and non-pharmacologic comfort measures Outcome: Progressing   Problem: Health Behavior/Discharge Planning: Goal: Ability to manage health-related needs will improve Outcome: Progressing   Problem: Clinical Measurements: Goal: Ability to maintain clinical measurements within normal limits will improve Outcome: Progressing Goal: Will remain free from infection Outcome: Progressing Goal: Diagnostic test results will improve Outcome: Progressing Goal: Respiratory complications will improve Outcome: Progressing Goal: Cardiovascular complication will be avoided Outcome: Progressing   Problem: Activity: Goal: Risk for activity intolerance will decrease Outcome: Progressing   Problem: Nutrition: Goal: Adequate nutrition will be maintained Outcome: Progressing   Problem: Coping: Goal: Level of anxiety will decrease Outcome: Progressing   Problem: Elimination: Goal: Will not experience complications related to bowel motility Outcome: Progressing Goal: Will not experience complications related to urinary retention Outcome: Progressing   Problem: Pain Managment: Goal: General experience of comfort will improve and/or be controlled Outcome: Progressing   Problem: Safety: Goal: Ability to remain free from injury will improve Outcome: Progressing   Problem: Skin  Integrity: Goal: Risk for impaired skin integrity will decrease Outcome: Progressing

## 2024-03-29 NOTE — Consult Note (Signed)
 Reason for Consult: Cervical spinal stenosis, bilateral subdural hematoma Referring Physician: Hospitalist and neurology  John [name] is an 63 y.o. male.   HPI:  63 year old gentleman with Parkinson's disease and recent pulmonary embolism who was found on MRI today to have cervical stenosis and small bilateral subdural hematomas.  Looking back at his CT scan of the head from 4 days ago the tiny subdural hematomas were there and are stable when compared to the CT scan of the head today.  He is unable to really cooperate with history and physical though he does follow some very simple commands and has garbled speech that is difficult to discern.  He does describe neck and shoulder pain.  He has had multiple recent falls and has sutures to the regions bilaterally above his eyes.  Neurology wanted us  to see the patient today or tomorrow regarding critical spinal stenosis in the cervical region  Past Medical History:  Diagnosis Date   Bilateral pulmonary embolism (HCC) 03/16/2024   Hypertension    Parkinson disease (HCC) 03/16/2024   Parkinson's disease (HCC)    Stroke [facility])     History reviewed. No pertinent surgical history.  Allergies  Allergen Reactions   Flomax  [Tamsulosin  Hcl] Anxiety and Other (See Comments)    Jittery     Social History   Tobacco Use   Smoking status: Former    Types: Cigars    Quit date: 2000    Years since quitting: 25.5   Smokeless tobacco: Never   Tobacco comments:    Quit 2000  Substance Use Topics   Alcohol use: Yes    Family History  Problem Relation Age of Onset   Cancer Father      Review of Systems  Positive ROS: Unable to obtain  All other systems have been reviewed and were otherwise negative with the exception of those mentioned in the HPI and as above.  Objective: Vital signs in last 24 hours: Temp:  [98.1 F (36.7 C)-98.6 F (37 C)] 98.1 F (36.7 C) (07/14 1700) Pulse Rate:  [98-112] 104 (07/14 1700) Resp:  [16-22] 18  (07/14 1700) BP: (99-156)/(64-102) 99/71 (07/14 1806) SpO2:  [92 %-99 %] 95 % (07/14 1700) FiO2 (%):  [93 %] 93 % (07/14 1835) Weight:  [881 kg] 118 kg (07/14 1116)  General Appearance: Alert, cooperative, no distress, appears older than stated age, language is garbled, he is in multiple restraints and mittens Head: Normocephalic, sutured lacerations above both eyes Eyes: PERRL, conjunctiva/corneas clear, gaze conjugate Neck: Supple, symmetrical, trachea midline Lungs: respirations unlabored Heart: Regular rate and rhythm Abdomen: Soft  NEUROLOGIC:   Mental status: Alert with decent attention span, no aphasia, good attention span, Memory and fund of knowledge difficult to assess Motor Exam - grossly normal, normal tone and bulk, he appears to have good strength throughout Sensory Exam - grossly normal Reflexes: symmetric and mildly brisk, no pathologic reflexes, No Hoffman's, No clonus Coordination - grossly normal Gait -not tested Balance -not tested Cranial Nerves: I: smell Not tested  II: visual acuity  OS: na  OD: na  II: visual fields Full to confrontation  II: pupils Equal, round, reactive to light  III,VII: ptosis None  III,IV,VI: extraocular muscles  Full ROM  V: mastication Normal  V: facial light touch sensation  Normal  V,VII: corneal reflex  Present  VII: facial muscle function - upper  Normal  VII: facial muscle function - lower Normal  VIII: hearing Not tested  IX: soft palate elevation  Normal  IX,X: gag reflex Present  XI: trapezius strength  5/5  XI: sternocleidomastoid strength 5/5  XI: neck flexion strength  5/5  XII: tongue strength  Normal    Data Review Lab Results  Component Value Date   WBC 5.5 03/29/2024   HGB 9.0 (L) 03/29/2024   HCT 28.8 (L) 03/29/2024   MCV 86.0 03/29/2024   PLT 342 03/29/2024   Lab Results  Component Value Date   NA 137 03/29/2024   K 3.9 03/29/2024   CL 106 03/29/2024   CO2 20 (L) 03/29/2024   BUN <5 (L)  03/29/2024   CREATININE 0.95 03/29/2024   GLUCOSE 83 03/29/2024   Lab Results  Component Value Date   INR 1.3 (H) 03/23/2024    Radiology: MR CERVICAL SPINE WO CONTRAST Result Date: 03/25/2024 CLINICAL DATA:  Initial evaluation for acute myelopathy. EXAM: MRI CERVICAL SPINE WITHOUT CONTRAST TECHNIQUE: Multiplanar, multisequence MR imaging of the cervical spine was performed. No intravenous contrast was administered. COMPARISON:  CT from earlier the same day FINDINGS: Alignment: Examination technically limited as the patient was unable to tolerate the full length of the exam. Sagittal imaging only was performed. No axial sequences were obtained. Additionally, provided images are degraded by motion artifact. Straightening of the normal cervical lordosis. Trace degenerative retrolisthesis of C3 on C4, with trace anterolisthesis of C6 on C7. Vertebrae: Vertebral body height maintained without acute or chronic fracture. Bone marrow signal intensity overall within normal limits. No visible worrisome osseous lesions. Suspected mild reactive marrow edema about the C3-4 facets due to facet arthritis. No other abnormal marrow edema. Cord: Grossly normal signal and morphology on this limited motion degraded exam. Posterior Fossa, vertebral arteries, paraspinal tissues: No visible abnormality on this limited exam. Disc levels: C2-C3: Mild disc bulge. Mild facet hypertrophy. No significant stenosis. C3-C4: Diffuse disc bulge with bilateral uncovertebral spurring. Mild to moderate spinal stenosis. Foramina not well assessed on this exam. C4-C5: Diffuse disc bulge with uncovertebral spurring. No significant spinal stenosis. Foramina appear grossly patent. C5-C6: Mild disc bulge with endplate and uncovertebral spurring. No significant stenosis. C6-C7: Degenerative intervertebral disc space narrowing with diffuse disc osteophyte complex. No significant spinal stenosis. No more than mild bilateral foraminal stenosis. C7-T1:  Mild disc bulge. Bilateral facet arthrosis. No significant canal or foraminal stenosis. IMPRESSION: 1. Technically limited exam due to the patient's inability to tolerate the study. Sagittal imaging only was performed. No axial sequences were obtained. Additionally, provided images are degraded by motion. 2. Grossly normal MRI appearance of the cervical spinal cord on this limited exam. 3. Multilevel cervical spondylosis with resultant mild to moderate spinal stenosis at C3-4. No other significant spinal stenosis. Evaluation of the neural foramina limited on this truncated exam. Electronically Signed   By: Morene Hoard M.D.   On: 03/25/2024 21:36   CT CERVICAL SPINE WO CONTRAST Result Date: 03/25/2024 CLINICAL DATA:  Multiple falls EXAM: CT CERVICAL SPINE WITHOUT CONTRAST TECHNIQUE: Multidetector CT imaging of the cervical spine was performed without intravenous contrast. Multiplanar CT image reconstructions were also generated. RADIATION DOSE REDUCTION: This exam was performed according to the departmental dose-optimization program which includes automated exposure control, adjustment of the mA and/or kV according to patient size and/or use of iterative reconstruction technique. COMPARISON:  None Available. FINDINGS: Alignment: Normal Skull base and vertebrae: No acute fracture. No primary bone lesion or focal pathologic process. Soft tissues and spinal canal: No prevertebral fluid or swelling. No visible canal hematoma. Disc levels: Diffuse cervical spondylosis. 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 Multilevel bilateral neural foraminal narrowing due to facet arthropathy. Upper chest: No acute findings Other: None IMPRESSION: No acute bony abnormality. Cervical spondylosis. Electronically Signed   By: Franky Crease M.D.   On: 03/25/2024 00:11   CT HEAD WO CONTRAST ( ) Result Date:  03/25/2024 CLINICAL DATA:  Multiple falls. EXAM: CT HEAD WITHOUT CONTRAST TECHNIQUE: Contiguous axial images were obtained from the base of the skull through the vertex without intravenous contrast. RADIATION DOSE REDUCTION: This exam was performed according to the departmental dose-optimization program which includes automated exposure control, adjustment of the mA and/or kV according to patient size and/or use of iterative reconstruction technique. COMPARISON:  03/24/2024 FINDINGS: Brain: No acute intracranial abnormality. Specifically, no hemorrhage, hydrocephalus, mass lesion, acute infarction, or significant intracranial injury. Patchy chronic small vessel disease throughout the deep white matter. Vascular: No hyperdense vessel or unexpected calcification. Skull: No acute calvarial abnormality. Sinuses/Orbits: Opacification of the right maxillary sinus. Fracture through the anterior wall the right maxillary sinus again noted, unchanged. Other: None IMPRESSION: No acute intracranial abnormality. Patchy chronic small vessel disease throughout the deep white matter. Electronically Signed   By: Franky Crease M.D.   On: 03/25/2024 00:09   EEG adult Result Date: 03/24/2024 [name], MD     03/24/2024  9:26 PM Patient Name: KASEM MOZER MRN: [id number] Epilepsy Attending: Arlin KIDD Shelton Referring Physician/Provider: Remi Pippin, NP Date: 03/24/2024 Duration: 23.25 mins Patient history:  63 y.o. male with a past medical history of parkinson's disease, HTN, recent PE now on eliquis  presenting after multiple falls and altered mental status. EEG to evaluate for seizure  Level of alertness: Awake AEDs during EEG study: Ativan  Technical aspects: This EEG study was done with scalp electrodes positioned according to the 10-20 International system of electrode placement. Electrical activity was reviewed with band pass filter of 1-70Hz , sensitivity of 7 uV/mm, display speed of 80mm/sec with a 60Hz  notched filter  applied as appropriate. EEG data were recorded continuously and digitally stored.  Video monitoring was available and reviewed as appropriate. Description: The posterior dominant rhythm consists of 8 Hz activity of moderate voltage (25-35 uV) seen predominantly in posterior head regions, symmetric and reactive to eye opening and eye closing. Drowsiness was characterized by attenuation of the posterior background rhythm. Physiologic photic driving was not seen during photic stimulation.  Hyperventilation was not performed.    IMPRESSION: This study is within normal limits. No seizures or epileptiform discharges were seen throughout the recording.  A normal interictal EEG does not exclude the diagnosis of epilepsy. Priyanka O Yadav   CT Head Wo Contrast Result Date: 03/24/2024 CLINICAL DATA:  Provided history: Head trauma, coagulopathy. EXAM: CT HEAD WITHOUT CONTRAST CT MAXILLOFACIAL WITHOUT CONTRAST CT CERVICAL SPINE WITHOUT CONTRAST TECHNIQUE: Multidetector CT imaging of the head, cervical spine, and maxillofacial structures were performed using the standard protocol without intravenous contrast. Multiplanar CT image reconstructions of the cervical spine and maxillofacial structures were also generated. RADIATION DOSE REDUCTION: This exam was performed according to the departmental dose-optimization program which includes automated exposure control, adjustment of the mA and/or kV according to patient size and/or use of iterative reconstruction technique. COMPARISON:  Head CT 03/23/2024. Maxillofacial CT 03/23/2024. Cervical spine CT 03/23/2024. FINDINGS: CT HEAD FINDINGS Mildly motion degraded examination. Within this limitation, findings are as follows. Brain: Generalized cerebral atrophy. Chronic lacunar infarct again demonstrated within the left basal ganglia region.  Patchy and ill-defined hypoattenuation within the cerebral white matter, nonspecific but compatible with mild chronic small vessel ischemic  disease. There is no acute intracranial hemorrhage. No demarcated cortical infarct. No extra-axial fluid collection. No evidence of an intracranial mass. No midline shift. Vascular: No hyperdense vessel.  Atherosclerotic calcifications. Skull: No calvarial fracture or aggressive osseous lesion. Other: Right forehead hematoma and laceration. CT MAXILLOFACIAL FINDINGS Moderately motion degraded examination. Within this limitation, findings are as follows. Osseous: Known acute, displaced fractures of the anterior and posterolateral walls of the right maxillary sinus. Known acute, nondisplaced fractures of the medial wall the right orbit and right inferior orbital rim, better appreciated on the prior maxillofacial CT examinations of 03/23/2024 and 03/10/2024. Within the limitations of motion degradation, no interval acute maxillofacial fracture is identified. Orbits: The globes are normal in size and contour. No evidence of retrobulbar hematoma. Sinuses: Near complete opacification of the right maxillary sinus, similar to the prior examination of 03/23/2024 and at least partly due to the presence of hemorrhage. Moderate mucosal thickening within the right sphenoid sinus. Mild mucosal thickening scattered within bilateral ethmoid air cells. Soft tissues: Right forehead laceration and hematoma. Right facial hematoma. CT CERVICAL SPINE FINDINGS Alignment: Levocurvature of the cervical spine. Nonspecific reversal of the expected cervical lordosis. No significant spondylolisthesis. Skull base and vertebrae: The basion-dental and atlanto-dental intervals are maintained.No evidence of acute fracture to the cervical spine. C5-C6 vertebral ankylosis. Unchanged mild anterior wedge deformity of the T1 vertebral body. Soft tissues and spinal canal: No prevertebral fluid or swelling. No visible canal hematoma. Disc levels: Diffuse cervical spondylosis. At C3-C4, a posterior disc osteophyte complex and ossification of the posterior  longitudinal ligament contribute to severe spinal canal stenosis. Multilevel bony neural foraminal narrowing. Multilevel facet arthropathy. Prominent ventral osteophytes at C3-C4, C4-C5, C5-C6, C6-C7 and C7-T1. Upper chest: No consolidation within the imaged lung apices. No visible pneumothorax. IMPRESSION: CT head: 1. Mildly motion degraded exam. 2. No evidence of an acute intracranial abnormality. 3. Right forehead hematoma and laceration. 4. Cerebral atrophy and cerebral white matter chronic small vessel ischemic disease. 5. Redemonstrated chronic lacunar infarct in the left basal ganglia region. CT maxillofacial: 1. Moderately motion degraded examination. 2. Known acute fractures of the right maxillary sinus, medial right orbital wall and right inferior orbital rim. 3. Within the limitations of motion degradation, no interval acute maxillofacial fracture is identified. 4. Persistent near complete opacification of the right maxillary sinus (at least partly due to the presence of hemorrhage). 5. Mucosal thickening within the right sphenoid and bilateral ethmoid sinuses. 6. Right forehead hematoma and laceration. 7. Right facial hematoma. CT cervical spine: 1. No evidence of an acute cervical spine fracture. 2. Unchanged mild anterior wedge deformity of the T1 vertebral body. 3. Levocurvature of the cervical spine. 4. Nonspecific reversal of the expected cervical lordosis. 5. Cervical spondylosis as described within body of the report. 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 6. C5-C6 vertebral ankylosis. Electronically Signed   By: Rockey Childs D.O.   On: 03/24/2024 08:48   CT Cervical Spine Wo Contrast Result Date: 03/24/2024 CLINICAL DATA:  Provided history: Head trauma, coagulopathy. EXAM: CT HEAD WITHOUT CONTRAST CT MAXILLOFACIAL WITHOUT CONTRAST CT CERVICAL SPINE WITHOUT CONTRAST TECHNIQUE: Multidetector  CT imaging of the head, cervical spine, and maxillofacial structures were performed using the standard protocol without intravenous contrast. Multiplanar CT image reconstructions of the cervical spine and maxillofacial structures were also  generated. RADIATION DOSE REDUCTION: This exam was performed according to the departmental dose-optimization program which includes automated exposure control, adjustment of the mA and/or kV according to patient size and/or use of iterative reconstruction technique. COMPARISON:  Head CT 03/23/2024. Maxillofacial CT 03/23/2024. Cervical spine CT 03/23/2024. FINDINGS: CT HEAD FINDINGS Mildly motion degraded examination. Within this limitation, findings are as follows. Brain: Generalized cerebral atrophy. Chronic lacunar infarct again demonstrated within the left basal ganglia region. Patchy and ill-defined hypoattenuation within the cerebral white matter, nonspecific but compatible with mild chronic small vessel ischemic disease. There is no acute intracranial hemorrhage. No demarcated cortical infarct. No extra-axial fluid collection. No evidence of an intracranial mass. No midline shift. Vascular: No hyperdense vessel.  Atherosclerotic calcifications. Skull: No calvarial fracture or aggressive osseous lesion. Other: Right forehead hematoma and laceration. CT MAXILLOFACIAL FINDINGS Moderately motion degraded examination. Within this limitation, findings are as follows. Osseous: Known acute, displaced fractures of the anterior and posterolateral walls of the right maxillary sinus. Known acute, nondisplaced fractures of the medial wall the right orbit and right inferior orbital rim, better appreciated on the prior maxillofacial CT examinations of 03/23/2024 and 03/10/2024. Within the limitations of motion degradation, no interval acute maxillofacial fracture is identified. Orbits: The globes are normal in size and contour. No evidence of retrobulbar hematoma. Sinuses: Near complete  opacification of the right maxillary sinus, similar to the prior examination of 03/23/2024 and at least partly due to the presence of hemorrhage. Moderate mucosal thickening within the right sphenoid sinus. Mild mucosal thickening scattered within bilateral ethmoid air cells. Soft tissues: Right forehead laceration and hematoma. Right facial hematoma. CT CERVICAL SPINE FINDINGS Alignment: Levocurvature of the cervical spine. Nonspecific reversal of the expected cervical lordosis. No significant spondylolisthesis. Skull base and vertebrae: The basion-dental and atlanto-dental intervals are maintained.No evidence of acute fracture to the cervical spine. C5-C6 vertebral ankylosis. Unchanged mild anterior wedge deformity of the T1 vertebral body. Soft tissues and spinal canal: No prevertebral fluid or swelling. No visible canal hematoma. Disc levels: Diffuse cervical spondylosis. 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 Multilevel bony neural foraminal narrowing. Multilevel facet arthropathy. Prominent ventral osteophytes at C3-C4, C4-C5, C5-C6, C6-C7 and C7-T1. Upper chest: No consolidation within the imaged lung apices. No visible pneumothorax. IMPRESSION: CT head: 1. Mildly motion degraded exam. 2. No evidence of an acute intracranial abnormality. 3. Right forehead hematoma and laceration. 4. Cerebral atrophy and cerebral white matter chronic small vessel ischemic disease. 5. Redemonstrated chronic lacunar infarct in the left basal ganglia region. CT maxillofacial: 1. Moderately motion degraded examination. 2. Known acute fractures of the right maxillary sinus, medial right orbital wall and right inferior orbital rim. 3. Within the limitations of motion degradation, no interval acute maxillofacial fracture is identified. 4. Persistent near complete opacification of the right maxillary sinus (at least partly due to  the presence of hemorrhage). 5. Mucosal thickening within the right sphenoid and bilateral ethmoid sinuses. 6. Right forehead hematoma and laceration. 7. Right facial hematoma. CT cervical spine: 1. No evidence of an acute cervical spine fracture. 2. Unchanged mild anterior wedge deformity of the T1 vertebral body. 3. Levocurvature of the cervical spine. 4. Nonspecific reversal of the expected cervical lordosis. 5. Cervical spondylosis as described within body of the report. 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 6. C5-C6 vertebral ankylosis. Electronically Signed  By: Rockey Childs D.O.   On: 03/24/2024 08:48   CT Maxillofacial Wo Contrast Result Date: 03/24/2024 CLINICAL DATA:  Provided history: Head trauma, coagulopathy. EXAM: CT HEAD WITHOUT CONTRAST CT MAXILLOFACIAL WITHOUT CONTRAST CT CERVICAL SPINE WITHOUT CONTRAST TECHNIQUE: Multidetector CT imaging of the head, cervical spine, and maxillofacial structures were performed using the standard protocol without intravenous contrast. Multiplanar CT image reconstructions of the cervical spine and maxillofacial structures were also generated. RADIATION DOSE REDUCTION: This exam was performed according to the departmental dose-optimization program which includes automated exposure control, adjustment of the mA and/or kV according to patient size and/or use of iterative reconstruction technique. COMPARISON:  Head CT 03/23/2024. Maxillofacial CT 03/23/2024. Cervical spine CT 03/23/2024. FINDINGS: CT HEAD FINDINGS Mildly motion degraded examination. Within this limitation, findings are as follows. Brain: Generalized cerebral atrophy. Chronic lacunar infarct again demonstrated within the left basal ganglia region. Patchy and ill-defined hypoattenuation within the cerebral white matter, nonspecific but compatible with mild chronic small vessel ischemic disease.  There is no acute intracranial hemorrhage. No demarcated cortical infarct. No extra-axial fluid collection. No evidence of an intracranial mass. No midline shift. Vascular: No hyperdense vessel.  Atherosclerotic calcifications. Skull: No calvarial fracture or aggressive osseous lesion. Other: Right forehead hematoma and laceration. CT MAXILLOFACIAL FINDINGS Moderately motion degraded examination. Within this limitation, findings are as follows. Osseous: Known acute, displaced fractures of the anterior and posterolateral walls of the right maxillary sinus. Known acute, nondisplaced fractures of the medial wall the right orbit and right inferior orbital rim, better appreciated on the prior maxillofacial CT examinations of 03/23/2024 and 03/10/2024. Within the limitations of motion degradation, no interval acute maxillofacial fracture is identified. Orbits: The globes are normal in size and contour. No evidence of retrobulbar hematoma. Sinuses: Near complete opacification of the right maxillary sinus, similar to the prior examination of 03/23/2024 and at least partly due to the presence of hemorrhage. Moderate mucosal thickening within the right sphenoid sinus. Mild mucosal thickening scattered within bilateral ethmoid air cells. Soft tissues: Right forehead laceration and hematoma. Right facial hematoma. CT CERVICAL SPINE FINDINGS Alignment: Levocurvature of the cervical spine. Nonspecific reversal of the expected cervical lordosis. No significant spondylolisthesis. Skull base and vertebrae: The basion-dental and atlanto-dental intervals are maintained.No evidence of acute fracture to the cervical spine. C5-C6 vertebral ankylosis. Unchanged mild anterior wedge deformity of the T1 vertebral body. Soft tissues and spinal canal: No prevertebral fluid or swelling. No visible canal hematoma. Disc levels: Diffuse cervical spondylosis. At C3-C4, a posterior disc osteophyte complex and ossification of the posterior  longitudinal ligament contribute to severe spinal canal stenosis. Multilevel bony neural foraminal narrowing. Multilevel facet arthropathy. Prominent ventral osteophytes at C3-C4, C4-C5, C5-C6, C6-C7 and C7-T1. Upper chest: No consolidation within the imaged lung apices. No visible pneumothorax. IMPRESSION: CT head: 1. Mildly motion degraded exam. 2. No evidence of an acute intracranial abnormality. 3. Right forehead hematoma and laceration. 4. Cerebral atrophy and cerebral white matter chronic small vessel ischemic disease. 5. Redemonstrated chronic lacunar infarct in the left basal ganglia region. CT maxillofacial: 1. Moderately motion degraded examination. 2. Known acute fractures of the right maxillary sinus, medial right orbital wall and right inferior orbital rim. 3. Within the limitations of motion degradation, no interval acute maxillofacial fracture is identified. 4. Persistent near complete opacification of the right maxillary sinus (at least partly due to the presence of hemorrhage). 5. Mucosal thickening within the right sphenoid and bilateral ethmoid sinuses. 6. Right forehead hematoma and laceration. 7. Right facial hematoma.  CT cervical spine: 1. No evidence of an acute cervical spine fracture. 2. Unchanged mild anterior wedge deformity of the T1 vertebral body. 3. Levocurvature of the cervical spine. 4. Nonspecific reversal of the expected cervical lordosis. 5. Cervical spondylosis as described within body of the report. 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 6. C5-C6 vertebral ankylosis. Electronically Signed   By: Rockey Childs D.O.   On: 03/24/2024 08:48   DG Knee Complete 4 Views Left Result Date: 03/24/2024 CLINICAL DATA:  pain after fall EXAM: LEFT KNEE - COMPLETE 4+ VIEW COMPARISON:  February 16, 2024 FINDINGS: No acute fracture or dislocation. Heterotopic bone formation along the fibular  head. No joint effusion. There is no evidence of arthropathy or other focal bone abnormality. Soft tissues are unremarkable. IMPRESSION: No acute fracture or dislocation. Electronically Signed   By: Rogelia Myers M.D.   On: 03/24/2024 08:39     Assessment/Plan: Estimated body mass index is 33.4 kg/m as calculated from the following:   Height as of this encounter: 6' 2 (1.88 m).   Weight as of this encounter: 118 kg.   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  He was on Eliquis  for PE 2 weeks ago.  That has been stopped.  No reversal.  Looking back at the CT scan of the head from 4 days ago it was read as no acute intracranial abnormality but the tiny extra-axial collections were there at that time and they were stable on the scan done this evening.  There has been no change despite the Eliquis .  These require no surgical intervention at this time.  Would follow with serial imaging.  Neurology on board.  Could consider MMA embolization with interventional radiology, and this may allow earlier resumption of anticoagulants if they are necessary.  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  I think he has moderate stenosis with fairly mild flattening of the cord.  He is mildly hyperreflexic, I cannot test his gait, he has no Hoffmann sign or clonus.  I really do not find him to be a good surgical candidate.  This would require posterior cervical decompression and instrumented fusion versus anterior corpectomy.  These would be a large surgeries for him and he seems to be fairly ill-appearing and appears older than stated age.  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  He has declining functional status.  I am just not sure he would tolerate such a surgery very well.  The risk of DVT PE would be quite high, and risk of failure of the construct or breakage of the construct with multiple falls would be higher than normal.  He certainly could not undergo urgent surgery over the next 3 days because of the Eliquis , but other than preventing a central cord syndrome from his chronic falling, I am not sure surgery is indicated at this time.  Potentially could be mildly myelopathic with some hyperreflexia, but surgery would not likely reverse this, though it could prevent progressive myelopathy.  However, do the risks outweigh the benefits in this particular  case?  Certainly recommend a frank discussion with the family about goals of care prior to any aggressive intervention on this gentleman, as they need to understand what we can and cannot accomplish here and we need to understand how aggressive they would like to be.    Alm GORMAN Molt 03/29/2024 9:54 PM

## 2024-03-29 NOTE — Anesthesia Preprocedure Evaluation (Addendum)
 Anesthesia Evaluation  Patient identified by MRN, date of birth, ID band Patient confused    Reviewed: Allergy & Precautions, NPO status , Patient's Chart, lab work & pertinent test results  History of Anesthesia Complications Negative for: history of anesthetic complications  Airway Mallampati: III  TM Distance: >3 FB     Dental  (+) Dental Advisory Given, Poor Dentition   Pulmonary former smoker, PE (bilateral)   Pulmonary exam normal breath sounds clear to auscultation       Cardiovascular hypertension,  Rhythm:Regular Rate:Normal  TTE 03/11/2024: IMPRESSIONS    1. Left ventricular ejection fraction, by estimation, is 60 to 65%. The  left ventricle has normal function. The left ventricle has no regional  wall motion abnormalities. Left ventricular diastolic parameters are  consistent with Grade I diastolic  dysfunction (impaired relaxation).   2. Right ventricular systolic function is normal. The right ventricular  size is normal.   3. Left atrial size was mildly dilated.   4. Right atrial size was mildly dilated.   5. There is no evidence of cardiac tamponade.   6. The mitral valve is grossly normal. Trivial mitral valve  regurgitation.   7. The aortic valve is tricuspid. Aortic valve regurgitation is not  visualized.   8. The inferior vena cava is normal in size with greater than 50%  respiratory variability, suggesting right atrial pressure of 3 mmHg.     Neuro/Psych Seizures -,   Neuromuscular disease (Parkinson's disease, c-spine stenosis) CVA    GI/Hepatic ,GERD  Medicated,,  Endo/Other    Renal/GU Renal disease     Musculoskeletal   Abdominal  (+) + obese  Peds  Hematology  (+) Blood dyscrasia, anemia Lab Results      Component                Value               Date                      WBC                      5.5                 03/29/2024                HGB                      9.0 (L)              03/29/2024                HCT                      28.8 (L)            03/29/2024                MCV                      86.0                03/29/2024                PLT                      342  03/29/2024              Anesthesia Other Findings  63 y.o. male with medical history significant of hypertension, CVA, Parkinson's disease, recent hospitalization within the last month, most recently end of June for acute PE, started on Eliquis , and another admission just discharged 7/7, after presenting with a concern for seizure with AKI and hyponatremia which resolved.  Patient was sent to SNF, presents to ED secondary to fall with facial trauma . - In ED patient was noted to be afebrile, but later developed fever 101.6,  While in the ED patient had been noted to have fallen out of bed and  ripped out his Foley catheter which was able to be replaced.  Last Eliquis : this morning  Reproductive/Obstetrics                              Anesthesia Physical Anesthesia Plan  ASA: 4  Anesthesia Plan: General   Post-op Pain Management: Minimal or no pain anticipated   Induction: Intravenous  PONV Risk Score and Plan: 2 and Ondansetron , Dexamethasone  and Treatment may vary due to age or medical condition  Airway Management Planned: Oral ETT and Video Laryngoscope Planned  Additional Equipment:   Intra-op Plan:   Post-operative Plan: Extubation in OR  Informed Consent: I have reviewed the patients History and Physical, chart, labs and discussed the procedure including the risks, benefits and alternatives for the proposed anesthesia with the patient or authorized representative who has indicated his/her understanding and acceptance.     Dental advisory given  Plan Discussed with: CRNA and Anesthesiologist  Anesthesia Plan Comments: (Risks of general anesthesia discussed including, but not limited to, sore throat, hoarse voice, chipped/damaged  teeth, injury to vocal cords, nausea and vomiting, allergic reactions, lung infection, heart attack, stroke, and death. All questions answered. )         Anesthesia Quick Evaluation

## 2024-03-29 NOTE — Progress Notes (Addendum)
 Notified by radiology there is bilateral subdural hematoma, 6 mm on the right, 4 mm on the left, no midline shift, holding his Eliquis , radiology requested CT head to evaluate of any active bleed, neurosurgery consulted, I have reached out to neurosurgery for consult as well, updated his sister Ms. Tanda Pao. Brayton Powers MD

## 2024-03-29 NOTE — Transfer of Care (Signed)
 Immediate Anesthesia Transfer of Care Note  Patient: John [name]  Procedure(s) Performed: RADIOLOGY WITH ANESTHESIA  Patient Location: PACU  Anesthesia Type:General  Level of Consciousness: awake and drowsy  Airway & Oxygen Therapy: Patient Spontanous Breathing and Patient connected to face mask oxygen  Post-op Assessment: Report given to RN and Post -op Vital signs reviewed and stable  Post vital signs: Reviewed and stable  Last Vitals:  Vitals Value Taken Time  BP 103/65 03/29/24 16:00  Temp 36.8 C 03/29/24 15:50  Pulse 104 03/29/24 16:03  Resp 22 03/29/24 16:03  SpO2 93 % 03/29/24 16:03  Vitals shown include unfiled device data.  Last Pain:  Vitals:   03/29/24 1550  TempSrc:   PainSc: Asleep      Patients Stated Pain Goal: 0 (03/29/24 0803)  Complications: No notable events documented.

## 2024-03-29 NOTE — Progress Notes (Signed)
 NEUROLOGY CONSULT FOLLOW UP NOTE   Date of service: March 29, 2024 Patient Name: John [name] MRN:  [id number] DOB:  12-03-1960  Interval Hx/subjective   Neurology reconsulted for fluctuating level of consciousness. Patient was in MRI under general anesthesia when we were reconsulted.  This revealed small bilateral subdural hematomas.  Follow-up CT head showed no acute blood  Vitals   Vitals:   03/29/24 1630 03/29/24 1645 03/29/24 1700 03/29/24 1806  BP: 105/69 102/69 115/78 99/71  Pulse: (!) 107 (!) 107 (!) 104   Resp: (!) 22 20 18    Temp:   98.1 F (36.7 C)   TempSrc:      SpO2: 94% 92% 95%   Weight:      Height:         Body mass index is 33.4 kg/m.  Physical Exam   Constitutional: Chronically ill-appearing elderly patient in no acute distress Psych: Calm, cooperative with exam HENT: No OP obstruction. Multiple areas of contusions, bruising, 2 lacerations on the front of his face.   Cardiovascular: RRR  Respiratory: No distress, regular unlabored GI: Soft.  No distension. There is no tenderness.  Skin: WDI.   Neurologic Examination   Neuro: Mental Status: Patient is awake, responds to name but is disoriented to place, time and situation.  He is able to follow single and two-step commands. Cranial Nerves: II: PERRL III,IV, VI: EOMI  V: Facial sensation is symmetric to light touch VII: Facial movement is symmetric resting and smiling VIII: Hearing is intact to voice XII: Tongue protrudes midline without atrophy or fasciculations.  Motor: Able to move all 4 extremities to command with good antigravity strength Sensory: Sensation is symmetric to light touch in the arms and legs.  Medications  Current Facility-Administered Medications:    acetaminophen  (TYLENOL ) tablet 650 mg, 650 mg, Oral, Q6H PRN, 650 mg at 03/27/24 0534 **OR** acetaminophen  (TYLENOL ) suppository 650 mg, 650 mg, Rectal, Q6H PRN, Claudene Reeves A, MD, 650 mg at 03/24/24 2329   albuterol   (PROVENTIL ) (2.5 MG/3ML) 0.083% nebulizer solution 2.5 mg, 2.5 mg, Nebulization, Q6H PRN, Claudene, Rondell A, MD   carbidopa -levodopa  (SINEMET  CR) 50-200 MG per tablet controlled release 1 tablet, 1 tablet, Oral, 6 times per day, Claudene Reeves A, MD, 1 tablet at 03/29/24 0932   cefTRIAXone  (ROCEPHIN ) 1 g in sodium chloride  0.9 % 100 mL IVPB, 1 g, Intravenous, Q24H, Smith, Rondell A, MD, Last Rate: 200 mL/hr at 03/28/24 2020, 1 g at 03/28/24 2020   Chlorhexidine  Gluconate Cloth 2 % PADS 6 each, 6 each, Topical, Daily, Claudene Reeves A, MD, 6 each at 03/28/24 9096   cyanocobalamin  (VITAMIN B12) injection 1,000 mcg, 1,000 mcg, Subcutaneous, Daily, 1,000 mcg at 03/29/24 0948 **FOLLOWED BY** [START ON 04/02/2024] cyanocobalamin  (VITAMIN B12) injection 1,000 mcg, 1,000 mcg, Subcutaneous, Q30 days, Elgergawy, Brayton RAMAN, MD   fentaNYL  (SUBLIMAZE ) 100 MCG/2ML injection, , , ,    haloperidol  lactate (HALDOL ) injection 2 mg, 2 mg, Intravenous, Q6H PRN, Elgergawy, Brayton RAMAN, MD   lactated ringers  infusion, , Intravenous, Continuous, Elgergawy, Brayton RAMAN, MD, Last Rate: 125 mL/hr at 03/29/24 1249, Restarted at 03/29/24 1520   LORazepam  (ATIVAN ) injection 0.5 mg, 0.5 mg, Intravenous, Q6H PRN, Claudene, Rondell A, MD, 0.5 mg at 03/27/24 2216   midazolam  (VERSED ) 2 MG/2ML injection, , , ,    ondansetron  (ZOFRAN ) tablet 4 mg, 4 mg, Oral, Q6H PRN **OR** ondansetron  (ZOFRAN ) injection 4 mg, 4 mg, Intravenous, Q6H PRN, Claudene Reeves LABOR, MD   QUEtiapine  (SEROQUEL ) tablet  50 mg, 50 mg, Oral, QHS, Elgergawy, Dawood S, MD, 50 mg at 03/28/24 2114   senna-docusate (Senokot-S) tablet 1 tablet, 1 tablet, Oral, BID, Elgergawy, Dawood S, MD, 1 tablet at 03/29/24 0932   sodium chloride  flush (NS) 0.9 % injection 3 mL, 3 mL, Intravenous, Q12H, Smith, Rondell A, MD, 3 mL at 03/29/24 0933   [COMPLETED] thiamine  (VITAMIN B1) 500 mg in sodium chloride  0.9 % 50 mL IVPB, 500 mg, Intravenous, Q8H, Last Rate: 110 mL/hr at 03/27/24 0530, 500 mg at  03/27/24 0530 **FOLLOWED BY** thiamine  (VITAMIN B1) 250 mg in sodium chloride  0.9 % 50 mL IVPB, 250 mg, Intravenous, Daily, Last Rate: 105 mL/hr at 03/29/24 0936, 250 mg at 03/29/24 0936 **FOLLOWED BY** [START ON 04/03/2024] thiamine  (VITAMIN B1) injection 100 mg, 100 mg, Intravenous, Daily, Bhagat, Srishti L, MD   vitamin E  capsule 400 Units, 400 Units, Oral, Daily, Elgergawy, Brayton RAMAN, MD, 400 Units at 03/29/24 9061   zinc  sulfate (50mg  elemental zinc ) capsule 220 mg, 220 mg, Oral, Daily, Elgergawy, Brayton RAMAN, MD, 220 mg at 03/28/24 0903  Labs and Diagnostic Imaging   CBC:  Recent Labs  Lab 03/24/24 0707 03/25/24 0624 03/28/24 0742 03/29/24 0605  WBC 9.2   < > 5.1 5.5  NEUTROABS 6.7  --   --   --   HGB 8.9*   < > 8.3* 9.0*  HCT 28.7*   < > 26.0* 28.8*  MCV 87.8   < > 86.1 86.0  PLT 304   < > 312 342   < > = values in this interval not displayed.    Basic Metabolic Panel:  Lab Results  Component Value Date   NA 137 03/29/2024   K 3.9 03/29/2024   CO2 20 (L) 03/29/2024   GLUCOSE 83 03/29/2024   BUN <5 (L) 03/29/2024   CREATININE 0.95 03/29/2024   CALCIUM 8.6 (L) 03/29/2024   GFRNONAA >60 03/29/2024   GFRAA 88 03/13/2020   Lipid Panel:  Lab Results  Component Value Date   LDLCALC 73 12/10/2023   Alcohol Level     Component Value Date/Time   ETH <15 03/16/2024 2143   INR  Lab Results  Component Value Date   INR 1.3 (H) 03/23/2024   Folate - 10.6 Copper  - mild elevation 135 (ref 69 - 132) Zinc  - 57 B6  B3 B1 Vitamin E  MMA  B12- 206  CT Head without contrast 7/9 and 7/10 (Personally reviewed): No acute intracranial abnormality. Patchy chronic small vessel disease throughout the deep white matter.  CT C- spine 7/9: No acute bony abnormality. Cervical spondylosis with up to moderate to severe stenosis at C3/4   MRI brain under general anesthesia 7/14: Subdural hematomas overlying bilateral cerebral convexities, 6 mm in thickness on the right and 4 mm in  thickness on the left, balanced mass effect upon underlying cerebral hemispheres without midline shift, mild diffuse dural thickening and enhancement overlying bilateral cerebral convexities along the falx, small cluster of chronic microhemorrhages within anterior left frontal lobe, mild chronic small vessel ischemic changes  MRI of the cervical, thoracic and lumbar spine under general anesthesia 7/14: Cervical spondylosis with spinal canal stenosis at C3-C4 at the right aspect of the spinal canal with spinal cord flattening, small focus of signal abnormality with right within right aspect of spinal cord at this level, bilateral neuroforaminal narrowing, additional sites of foraminal stenosis greatest on the left at C6-C7, mild patchy edema within posterior elements, likely degenerative and related to facet arthropathy,  suspected bridging osseous fusion across C5-C6 disc space, bulky bridging ventral osteophyte at C3-C4, mild grade 1 spondee U lithiasis at C3-C4 and C4-C5, no signal abnormality identified within cervical spinal cord, mild chronic anterior wedge deformities at T1 T7 T11 and T12 vertebrae, degenerative appearing edema and enhancement within T12 vertebral body, moderate neuroforaminal narrowing at T3-T4, disc degeneration greatest at T6-C7, T7-T8, T8-T9 and T9-T10, partially imaged sacral edema and enhancement, with lumbar spondylosis with no significant central canal stenosis, levocurvature of the lumbar spine  rEEG: 7/9  IMPRESSION: This study is within normal limits. No seizures or epileptiform discharges were seen throughout the recording. A normal interictal EEG does not exclude the diagnosis of epilepsy.    Assessment   John [name] is a 63 y.o. male with a past medical history of parkinson's disease, HTN, recent PE now on eliquis  presenting after multiple falls and altered mental status.  Significant gradual decline consistent with progressive Parkinson's disease.  Unable to  safely manage medications at home or maintain home in a safe living condition.  Some possible cognitive decline as well although was still holding down his job at [employer] until disability recently approved within the past 2 months or so.  Multifactorial gait disorder due to PD, c-spine stenosis, back pain, orthostasis   On 7/14, patient underwent MRI of the brain and total spine under anesthesia.  Small bilateral subdurals were noted. Head CT showed no acute blood. Neurosurgery was consulted by hospitalist who recommended holding eliquis . Dr. Sherlon d/w Dr. Matthews by phone and stated that he felt comfortable with this since his PE were 2 weeks ago and no DVT identified on imaging.   Given critical cervical spinal stenosis, patient will need neurosurgical evaluation today or tomorrow.   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 - Critical cervical spinal stenosis - Potential postconcussive syndrome - Unlikely underlying seizure, though no clear description of seizures - Delirium in the setting of pain, hospitalization, head injury, medications   Recommendations  -Holding eliquis  for now see above - No indication for ICU or strict BP goals given subdurals appear chronic, patient has remained <160 SBP past 24 hrs -Neurosurgical consult for critical cervical spinal stenosis - Repeat head CT stat with any change in exam - TED stockings and abdominal binder to be used only before ambulation (remove when at rest) - Low normal B12 at 206, MMA normal but still recommend goal > 600 - Follow up pending labs:  Vitamin E , B1, B3, B6, supplement as needed - Please continue carbidopa /levodopa  at his home dose - Empiric thiamine  supplementation pending B1 result (Wernicke's dosing used given high benefit, low risk) - Neurology will continue to follow; plan repeat exam tomorrow when GA has worn  off ______________________________________________________________________  Patient seen by NP and then by MD, MD to edit note as needed. Cortney E Everitt Clint Kill , MSN, AGACNP-BC [facility] See Amion for schedule and pager information 03/29/2024 6:27 PM    Attending Neurohospitalist Addendum Patient seen and examined with APP/Resident. Agree with the history and physical as documented above. Agree with the plan as documented, which I helped formulate. I have edited the note above to reflect my full findings and recommendations. I have independently reviewed the chart, obtained history, review of systems and examined the patient.I have personally reviewed pertinent head/neck/spine imaging (CT/MRI). Please feel free to call with any questions.  -- Elida Matthews, MD [facility] [phone]  If 7pm- 7am,  please page neurology on call as listed in AMION.

## 2024-03-29 NOTE — Progress Notes (Signed)
 PROGRESS NOTE    CLAUDE WALDMAN  FMW:[id number] DOB: 26-May-1961 DOA: 03/24/2024 PCP: [name], MD   Chief Complaint  Patient presents with   Fall    Brief Narrative:    John [name] is a 63 y.o. male with medical history significant of hypertension, CVA, Parkinson's disease, recent hospitalization within the last month, most recently end of June for acute PE, started on Eliquis , and another admission just discharged 7/7, after presenting with a concern for seizure with AKI and hyponatremia which resolved.  Patient was sent to SNF, presents to ED secondary to fall with facial trauma . - In ED patient was noted to be afebrile, but later developed fever 101.6,  While in the ED patient had been noted to have fallen out of bed and  ripped out his Foley catheter which was able to be replaced.    Assessment & Plan:   Principal Problem:   Frequent falls Active Problems:   Laceration of head   Urinary tract infection   Acute metabolic encephalopathy   Hypomagnesemia   History of pulmonary embolism   Parkinson's disease with fluctuating manifestations (HCC)   BPH with urinary obstruction   Obesity (BMI 30-39.9)   Frequent falls Laceration to forehead - Patient has a history of gait disturbance with frequent falls likely related to his history of Parkinson disease.   -CT scan of the cervical spine noted concern for cervical spinal stenosis. . -MRI cervical spine, thoracic spine, lumbar spine has been obtained by neurology, overall but per neurology there is no severe compressive process on spinal cord imaging, and his physical exam is reassuring with good strength . - Other labs has been checked for myelopathic and neuropathic etiology, 12 is borderline, started on supplement, he is on zinc  and vitamin EE given borderline levels . -High-dose thiamine  empirically, B1 level is pending - Following B3, B6 levels. 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   Laceration of the head -Sutured, he is on Rocephin  mainly for UTI which should cover as well, will need sutures removed in 10 days from application (7/18)   Urinary tract infection - Prior to arrival.   - Continue Rocephin    Acute metabolic encephalopathy -CT head 7/8, 7/9, 7/10 with no acute findings but significant for patchy small vessel disease throughout the deep white matter. - Resume B12, zinc  and vitamin D supplements. - Developing Parkinson dementia prerenal. - Underlying acute process contributing to worsening dementia explaining acute deterioration in matter of few weeks including UTI, recurrent hospitalization with hospital delirium, as well likely postconcussive disease. - MRI brain with without contrast is pending, hopefully he will be able to sit still for the study. -Borderline B12 level, started on IM supplements - follow on myelopathy zinc , copper , folate, Vitamin E , vitamin D and zinc  borderline low so started on supplement, folate within normal limit, - follow on nutritional causes for altered mental status: B1, B3, B6 -Significant agitation and restlessness overnight, so we will try low-dose Seroquel  at nighttime.,  On as needed Haldol  -Will proceed with MRI brain with and without IV contrast under general anesthesia, given multiple attempts despite anxiety medicine were unsuccessful, and as discussed neurology, will repeat MRI cervical/thoracic/lumbar spine with and without IV contrast as now he will be under general anesthesia, and those previous imaging were poor quality.   Hypomagnesemia Hypophosphatemia -Replaced  Pulmonary embolism Patient found to have bilateral pulmonary embolism during previous hospitalization - Continue Eliquis    Parkinson's disease - Continue Sinemet    BPH with urinary obstruction - Failed voiding trial yesterday, Foley  catheter reinserted 7/13   Obesity BMI 33.4 kg/m  Hypokalemia Hypophosphatemia -Replaced   DVT prophylaxis: Eliquis  Code Status: Full Family Communication: None at bedside, discussed with sister by phone 7/13. Disposition:   Status is: Inpatient    Consultants:  neurology   Subjective:  Unsuccessful attempt yesterday with sedation for MRI, Foley catheter was reinserted yesterday due to recurrent retention Objective: Vitals:   03/29/24 0339 03/29/24 0344 03/29/24 0749 03/29/24 1116  BP: 138/64  (!) 143/89 (!) 126/91  Pulse:   (!) 112 (!) 104  Resp: 20   20  Temp: 98.6 F (37 C)  98.5 F (36.9 C) 98.4 F (36.9 C)  TempSrc: Oral  Oral Axillary  SpO2:  93%  96%  Weight:    118 kg  Height:    6' 2 (1.88 m)    Intake/Output Summary (Last 24 hours) at 03/29/2024 1523 Last data filed at 03/29/2024 0735 Gross per 24 hour  Intake --  Output 3925 ml  Net -3925 ml   Filed Weights   03/24/24 0706 03/29/24 1116  Weight: 118 kg 118 kg    Examination:    GEN somnolent, but opens his eyes, answering to his name only, otherwise impaired judgment and insight, does not answer any questions appropriately or following commands  Significant facial bruising, please see picture below . Good air entry bilaterally  Regular rate and rhythm  Abdomen soft  Extremities with no edema.  Multiple bruises from previous falls .     Data Reviewed: I have personally reviewed following labs and imaging studies  CBC: Recent Labs  Lab 03/24/24 0707 03/25/24 0624 03/26/24 0628 03/27/24 0624 03/28/24 0742 03/29/24 0605  WBC 9.2 7.1 5.5 5.5 5.1 5.5  NEUTROABS 6.7  --   --   --   --   --   HGB 8.9* 8.2* 8.0* 8.1* 8.3* 9.0*  HCT 28.7* 26.3* 25.6* 25.0* 26.0* 28.8*  MCV 87.8 86.5 87.4 85.6 86.1 86.0  PLT 304 277 277 298 312 342    Basic Metabolic Panel: Recent Labs  Lab 03/25/24 0624 03/26/24 0628 03/27/24 0624 03/28/24 0742 03/29/24 0605  NA 142 140 139 136 137  K 3.5  3.3* 3.7 3.7 3.9  CL 111 110 111 107 106  CO2 23 20* 20* 20* 20*  GLUCOSE 92 92 89 92 83  BUN 10 10 7* <5* <5*  CREATININE 1.40* 0.93 0.93 0.88 0.95  CALCIUM 8.6* 8.1* 8.2* 8.3* 8.6*  MG 1.7 1.8 1.6* 2.1 1.8  PHOS 2.4* 2.3* 2.4* 2.5 2.6    GFR: Estimated Creatinine Clearance: 108.6 mL/min (by C-G formula based on SCr of 0.95 mg/dL).  Liver Function Tests: Recent Labs  Lab 03/23/24 1517 03/24/24 0707  AST 19 17  ALT 6 <5  ALKPHOS 88 83  BILITOT 0.8 1.2  PROT 5.9* 5.9*  ALBUMIN 3.1* 3.0*    CBG: No results for input(s): GLUCAP in the last 168 hours.   Recent Results (from the past 240 hours)  Urine Culture     Status: Abnormal   Collection Time: 03/23/24  6:45 PM   Specimen: Urine, Clean Catch  Result Value Ref Range Status   Specimen Description URINE, CLEAN CATCH  Final   Special Requests NONE  Final   Culture (A)  Final    >=  100,000 COLONIES/mL AEROCOCCUS SPECIES Standardized susceptibility testing for this organism is not available. Performed at [facility], [address] [place], [place] [address]    Report Status 03/26/2024 FINAL  Final  Blood culture (routine x 2)     Status: None   Collection Time: 03/24/24 11:44 AM   Specimen: BLOOD LEFT ARM  Result Value Ref Range Status   Specimen Description BLOOD LEFT ARM  Final   Special Requests   Final    BOTTLES DRAWN AEROBIC AND ANAEROBIC Blood Culture results may not be optimal due to an inadequate volume of blood received in culture bottles   Culture   Final    NO GROWTH 5 DAYS Performed at [facility], [address], [place], [place] [address]    Report Status 03/29/2024 FINAL  Final  Blood culture (routine x 2)     Status: None   Collection Time: 03/24/24 11:50 AM   Specimen: BLOOD RIGHT ARM  Result Value Ref Range Status   Specimen Description BLOOD RIGHT ARM  Final   Special Requests   Final    BOTTLES DRAWN AEROBIC AND ANAEROBIC Blood Culture results may not be optimal due to an  inadequate volume of blood received in culture bottles   Culture   Final    NO GROWTH 5 DAYS Performed at [facility], [address], [place], [place] [address]    Report Status 03/29/2024 FINAL  Final         Radiology Studies: No results found.       Scheduled Meds:  [MAR Hold] apixaban   5 mg Oral BID   [MAR Hold] carbidopa -levodopa   1 tablet Oral 6 times per day   [MAR Hold] Chlorhexidine  Gluconate Cloth  6 each Topical Daily   [MAR Hold] cyanocobalamin   1,000 mcg Subcutaneous Daily   Followed by   [facility] Hold] cyanocobalamin   1,000 mcg Subcutaneous Q30 days   fentaNYL        midazolam        [MAR Hold] QUEtiapine   50 mg Oral QHS   [MAR Hold] senna-docusate  1 tablet Oral BID   [MAR Hold] sodium chloride  flush  3 mL Intravenous Q12H   [MAR Hold] thiamine  (VITAMIN B1) injection  100 mg Intravenous Daily   [MAR Hold] vitamin E   400 Units Oral Daily   [MAR Hold] zinc  sulfate (50mg  elemental zinc )  220 mg Oral Daily   Continuous Infusions:  [MAR Hold] cefTRIAXone  (ROCEPHIN )  IV 1 g (03/28/24 2020)   lactated ringers  125 mL/hr at 03/29/24 1211   [MAR Hold] thiamine  (VITAMIN B1) injection 250 mg (03/29/24 0936)     LOS: 5 days       Brayton Lye, MD [facility]   To contact the attending provider between 7A-7P or the covering provider during after hours 7P-7A, please log into the web site [email or web address] and access using universal [place] password for that web site. If you do not have the password, please call the hospital operator.  03/29/2024, 3:23 PM

## 2024-03-29 NOTE — Anesthesia Procedure Notes (Signed)
 Procedure Name: Intubation Date/Time: 03/29/2024 12:56 PM  Performed by: Julien Manus, CRNAPre-anesthesia Checklist: Patient identified, Emergency Drugs available, Suction available and Patient being monitored Patient Re-evaluated:Patient Re-evaluated prior to induction Oxygen Delivery Method: Circle system utilized Preoxygenation: Pre-oxygenation with 100% oxygen Induction Type: IV induction Ventilation: Mask ventilation without difficulty Laryngoscope Size: Glidescope and 4 Grade View: Grade I Tube type: Oral Tube size: 7.5 mm Number of attempts: 1 Airway Equipment and Method: Stylet and Video-laryngoscopy Placement Confirmation: ETT inserted through vocal cords under direct vision, positive ETCO2 and breath sounds checked- equal and bilateral Secured at: 23 cm Tube secured with: Tape Dental Injury: Teeth and Oropharynx as per pre-operative assessment

## 2024-03-30 ENCOUNTER — Inpatient Hospital Stay (HOSPITAL_COMMUNITY)

## 2024-03-30 ENCOUNTER — Encounter (HOSPITAL_COMMUNITY): Payer: Self-pay | Admitting: Radiology

## 2024-03-30 DIAGNOSIS — R410 Disorientation, unspecified: Secondary | ICD-10-CM | POA: Diagnosis not present

## 2024-03-30 DIAGNOSIS — R569 Unspecified convulsions: Secondary | ICD-10-CM | POA: Diagnosis not present

## 2024-03-30 DIAGNOSIS — S065XAA Traumatic subdural hemorrhage with loss of consciousness status unknown, initial encounter: Secondary | ICD-10-CM | POA: Diagnosis not present

## 2024-03-30 DIAGNOSIS — I6203 Nontraumatic chronic subdural hemorrhage: Secondary | ICD-10-CM | POA: Diagnosis not present

## 2024-03-30 DIAGNOSIS — G20A1 Parkinson's disease without dyskinesia, without mention of fluctuations: Secondary | ICD-10-CM | POA: Diagnosis not present

## 2024-03-30 DIAGNOSIS — R296 Repeated falls: Secondary | ICD-10-CM | POA: Diagnosis not present

## 2024-03-30 LAB — APTT: aPTT: 41 s — ABNORMAL HIGH (ref 24–36)

## 2024-03-30 LAB — CBC
HCT: 31 % — ABNORMAL LOW (ref 39.0–52.0)
Hemoglobin: 9.7 g/dL — ABNORMAL LOW (ref 13.0–17.0)
MCH: 27 pg (ref 26.0–34.0)
MCHC: 31.3 g/dL (ref 30.0–36.0)
MCV: 86.4 fL (ref 80.0–100.0)
Platelets: 399 K/uL (ref 150–400)
RBC: 3.59 MIL/uL — ABNORMAL LOW (ref 4.22–5.81)
RDW: 14.6 % (ref 11.5–15.5)
WBC: 9.8 K/uL (ref 4.0–10.5)
nRBC: 0 % (ref 0.0–0.2)

## 2024-03-30 LAB — HEPARIN LEVEL (UNFRACTIONATED): Heparin Unfractionated: 0.93 [IU]/mL — ABNORMAL HIGH (ref 0.30–0.70)

## 2024-03-30 LAB — PHOSPHORUS: Phosphorus: 1.5 mg/dL — ABNORMAL LOW (ref 2.5–4.6)

## 2024-03-30 LAB — BASIC METABOLIC PANEL WITH GFR
Anion gap: 13 (ref 5–15)
BUN: 12 mg/dL (ref 8–23)
CO2: 21 mmol/L — ABNORMAL LOW (ref 22–32)
Calcium: 9 mg/dL (ref 8.9–10.3)
Chloride: 107 mmol/L (ref 98–111)
Creatinine, Ser: 1.07 mg/dL (ref 0.61–1.24)
GFR, Estimated: >60 mL/min (ref >60)
Glucose, Bld: 89 mg/dL (ref 70–99)
Potassium: 4.2 mmol/L (ref 3.5–5.1)
Sodium: 141 mmol/L (ref 135–145)

## 2024-03-30 LAB — MAGNESIUM: Magnesium: 2 mg/dL (ref 1.7–2.4)

## 2024-03-30 MED ORDER — HEPARIN (PORCINE) 25000 UT/250ML-% IV SOLN
1800.0000 [IU]/h | INTRAVENOUS | Status: DC
  Administered 2024-03-30: 1500 [IU]/h via INTRAVENOUS
  Administered 2024-03-31: 1700 [IU]/h via INTRAVENOUS
  Administered 2024-03-31: 1850 [IU]/h via INTRAVENOUS
  Administered 2024-04-01: 1800 [IU]/h via INTRAVENOUS
  Filled 2024-03-30 (×4): qty 250

## 2024-03-30 MED ORDER — LACTATED RINGERS IV SOLN: INTRAVENOUS | Status: DC

## 2024-03-30 MED ORDER — SODIUM CHLORIDE 0.9% FLUSH: 10.0000 mL | INTRAVENOUS | Status: DC | PRN

## 2024-03-30 NOTE — Progress Notes (Signed)
 Patient ID: John [name], male   DOB: Sep 22, 1960, 63 y.o.   MRN: [id number] He does look better this morning.  He is more awake and alert and conversant.  Speech is still a little garbled.  He is moving all extremities well.  He denies headache or neck pain or numbness or tingling or weakness.  He still having some delirium and confusion at times.  Very difficult situation.  I would like to see how he does with therapy today.  He states he would rather not have any surgery.  We will start heparin  for the recent PE.  That way it can be turned off and reversed easily.  Plan follow-up head CT in a few days.  Regarding the cervical spinal stenosis, again I am not sure he is a great surgical candidate but he does look better than yesterday.  I think the goal of the surgery would be to decompress the canal and prevent progressive myelopathy and/or central cord syndrome with his falling.  I do not think it would reverse the neurologic issues he has currently.  It is still a consideration.

## 2024-03-30 NOTE — Progress Notes (Signed)
 Speech Language Pathology Treatment: Cognitive-Linguistic  Patient Details Name: John [name] MRN: [id number] DOB: 28-Nov-1960 Today's Date: 03/30/2024 Time: 1015-1030 SLP Time Calculation (min) (ACUTE ONLY): 15 min  Assessment / Plan / Recommendation Clinical Impression  Pt seen at bedside for skilled ST intervention targeting established goals. Diagnostic treatment today included administration of the Mini-Mental State Examination (MMSE). Pt scored 21/30, with difficulty noted on orientation to time, serial 7's, delayed recall (2/3 words recalled), writing and figure copying. Pt exhibited difficulty with clock drawing task as well. Pt improving cognitively, however, he continues to exhibit deficits in attention, orientation, memory and problem solving. Pt speaks at a rapid rate, which adversely affects intelligibility. Improved clarity of speech with slower rate and overarticulation. Pt receptive to education to reduce rate of speech, however, limited carryover anticipated at this time. ST will continue to follow acutely. Recommend continued ST intervention at next venue of care.   HPI HPI: Pt is a 63 year old man admitted from [place] [facility] with frequent falls and acute metabolic encephalopathy. CT scan of the head did not note any acute abnormality.  Per MD notes, it is unclear if symptoms are related to a concussion with repeated falls with trauma to the head or possibly progression of Parkinson's disease. +facial lacerations. PMH: Parkinsons Disease, HYN, CVA, PE, anemia, recent B PEs, UTI.      SLP Plan  Continue with current plan of care          Recommendations   Frequent supervision at DC            Frequent or constant Supervision/Assistance Cognitive communication deficit (R41.841);Dysarthria and anarthria (R47.1)     Continue with current plan of care    Janeane Cozart B. Dory, MSP, CCC-SLP Speech Language Pathologist Office: [phone]  Dory Caprice Daring 03/30/2024, 10:35 AM

## 2024-03-30 NOTE — Anesthesia Postprocedure Evaluation (Signed)
 Anesthesia Post Note  Patient: John [name]  Procedure(s) Performed: RADIOLOGY WITH ANESTHESIA     Patient location during evaluation: PACU Anesthesia Type: General Level of consciousness: awake Pain management: pain level controlled Vital Signs Assessment: post-procedure vital signs reviewed and stable Respiratory status: spontaneous breathing, nonlabored ventilation and respiratory function stable Cardiovascular status: blood pressure returned to baseline and stable Postop Assessment: no apparent nausea or vomiting Anesthetic complications: no   No notable events documented.  Last Vitals:    Last Pain:                 Garnette FORBES [place]

## 2024-03-30 NOTE — Progress Notes (Signed)
 Physical Therapy Treatment Patient Details Name: John [name] MRN: [id number] DOB: 1961-08-13 Today's Date: 03/30/2024   History of Present Illness Pt is a 63 year old man admitted from [facility] with frequent falls and AMS. Head CT negative. +facial lacerations. MRI shows cervical stenosis and small bilat SDH; neurosurgery following. PMH: Parkinsons Disease, HYN, CVA, PE, anemia, recent B PEs, UTI.    PT Comments  Pt with EEG donned upon PT arrival to room, and pt had pulled Ivs out stating I took them out because I need to go to [facility]. No active bleeding noted. Pt very unsteady and requires mod assist for bed mobility, repeated transfers into standing, and short distance gait in room. Pt limited by heavy posterior bias and unsteadiness, pt also endorsing knee pain while standing. Plan remains appropriate.   HR 120s-145 bpm throughout session     If plan is discharge home, recommend the following: A lot of help with bathing/dressing/bathroom;Assist for transportation;Help with stairs or ramp for entrance;Supervision due to cognitive status;A lot of help with walking and/or transfers   Can travel by private vehicle        Equipment Recommendations  None recommended by PT    Recommendations for Other Services       Precautions / Restrictions Precautions Precautions: Fall Recall of Precautions/Restrictions: Impaired Precaution/Restrictions Comments: watch BP/ HR Restrictions Weight Bearing Restrictions Per Provider Order: No     Mobility  Bed Mobility Overal bed mobility: Needs Assistance Bed Mobility: Supine to Sit, Sit to Supine     Supine to sit: Mod assist Sit to supine: Mod assist   General bed mobility comments: assist for trunk and LE management. cues for sequencing    Transfers Overall transfer level: Needs assistance Equipment used: Rolling walker (2 wheels) Transfers: Sit to/from Stand Sit to Stand: Min assist, +2 safety/equipment, From elevated  surface           General transfer comment: assist for rise and steady, heavy posterior bias and difficult for pt to correct. stand x3, from EOB and chair in room.    Ambulation/Gait Ambulation/Gait assistance: Mod assist, +2 safety/equipment Gait Distance (Feet): 5 Feet (x2) Assistive device: Rolling walker (2 wheels) Gait Pattern/deviations: Step-through pattern, Decreased stride length, Trunk flexed, Narrow base of support Gait velocity: decr     General Gait Details: assist to steady, guide RW, cues for upright posture and placement in RW. after 5 ft gait pt needing to sit stating I need to sit but unable to state why.   Stairs             Wheelchair Mobility     Tilt Bed    Modified Rankin (Stroke Patients Only)       Balance Overall balance assessment: Needs assistance Sitting-balance support: No upper extremity supported, Feet supported Sitting balance-Leahy Scale: Fair     Standing balance support: Bilateral upper extremity supported, During functional activity, Reliant on assistive device for balance Standing balance-Leahy Scale: Poor Standing balance comment: Reliant on RW and posterior lean                            Communication Communication Communication: Impaired Factors Affecting Communication: Reduced clarity of speech (worsening slurring of speech with continued session)  Cognition Arousal: Alert Behavior During Therapy: Restless, Impulsive   PT - Cognitive impairments: Orientation, Awareness, Memory, Attention, Safety/Judgement, Problem solving  PT - Cognition Comments: pt had removed IVs upon PT arrival to room, states I took them out, I need to go to [facility] before they close. PT reoriented pt to the hospital, pt stating I know where I am, but I need to go to the hospital tomorrow to meet with my neurologist. Pt with very poor safety awareness, requires multiple cues to wait for  PT. Following commands: Impaired Following commands impaired: Follows one step commands inconsistently, Follows one step commands with increased time    Cueing Cueing Techniques: Gestural cues, Verbal cues, Tactile cues  Exercises      General Comments        Pertinent Vitals/Pain Pain Assessment Pain Assessment: No/denies pain    Home Living                          Prior Function            PT Goals (current goals can now be found in the care plan section) Acute Rehab PT Goals Patient Stated Goal: stop falling so much PT Goal Formulation: With patient Time For Goal Achievement: 04/09/24 Potential to Achieve Goals: Fair Progress towards PT goals: Progressing toward goals    Frequency    Min 2X/week      PT Plan      Co-evaluation              AM-PAC PT 6 Clicks Mobility   Outcome Measure  Help needed turning from your back to your side while in a flat bed without using bedrails?: A Lot Help needed moving from lying on your back to sitting on the side of a flat bed without using bedrails?: A Lot Help needed moving to and from a bed to a chair (including a wheelchair)?: A Lot Help needed standing up from a chair using your arms (e.g., wheelchair or bedside chair)?: A Lot Help needed to walk in hospital room?: A Lot Help needed climbing 3-5 steps with a railing? : Total 6 Click Score: 11    End of Session Equipment Utilized During Treatment: Gait belt Activity Tolerance: Patient limited by fatigue Patient left: in bed;with call bell/phone within reach;with bed alarm set;with nursing/sitter in room Nurse Communication: Mobility status PT Visit Diagnosis: Unsteadiness on feet (R26.81);Other abnormalities of gait and mobility (R26.89);Other symptoms and signs involving the nervous system (R29.898)     Time: 8541-8468 PT Time Calculation (min) (ACUTE ONLY): 33 min  Charges:    $Therapeutic Activity: 23-37 mins PT General Charges $$  ACUTE PT VISIT: 1 Visit                     Johana RAMAN, PT DPT Acute Rehabilitation Services Secure Chat Preferred  Office [phone]    Jermall Isaacson FORBES [place] 03/30/2024, 4:45 PM

## 2024-03-30 NOTE — Progress Notes (Signed)
 LTM EEG hooked up and running - no initial skin breakdown - push button tested - [facility] monitoring.

## 2024-03-30 NOTE — Progress Notes (Signed)
 PHARMACY - ANTICOAGULATION CONSULT NOTE  Pharmacy Consult for Heparin  Indication: pulmonary embolus  Allergies  Allergen Reactions   Flomax  [Tamsulosin  Hcl] Anxiety and Other (See Comments)    Jittery     Patient Measurements: Height: 6' 2 (188 cm) Weight: 118 kg (260 lb 2.3 oz) IBW/kg (Calculated) : 82.2 HEPARIN  DW (KG): 107.3  Vital Signs: Temp: 99.1 F (37.3 C) (07/15 1732) Temp Source: Axillary (07/15 1732) BP: 109/68 (07/15 1732)  Labs: Recent Labs    03/28/24 0742 03/29/24 0605 03/30/24 1015 03/30/24 1743  HGB 8.3* 9.0* 9.7*  --   HCT 26.0* 28.8* 31.0*  --   PLT 312 342 399  --   APTT  --   --   --  41*  HEPARINUNFRC  --   --   --  0.93*  CREATININE 0.88 0.95 1.07  --     Estimated Creatinine Clearance: 96.4 mL/min (by C-G formula based on SCr of 1.07 mg/dL).   Medical History: Past Medical History:  Diagnosis Date   Bilateral pulmonary embolism (HCC) 03/16/2024   Hypertension    Parkinson disease (HCC) 03/16/2024   Parkinson's disease (HCC)    Stroke [facility])     Assessment: Patient recently started on apixaban  prior to this admission for acute PE, which is currently on hold due to bilateral subdural hematoma. Last apixaban  dose was on 7/14 at 0932. Will monitor aPTT levels to guide therapy.   Heparin  infusion initiated at 1500, level back 0.93 and aPTT 41 - not correlating. Per RN, drawn appropriately and no new bleeding noted.  Goal of Therapy:  Heparin  level 0.3 - 0.5 units/ml aPTT level 66 - 85 Monitor platelets by anticoagulation protocol: Yes   Plan:  Increase heparin  infusion to 1700 units/hr. No bolus with hematoma Will check anti-xa and aPTT level in 6 hours and daily while on heparin  until correlating Continue to monitor H&H platelets  Randall Call, PharmD, BCPS 03/30/24 7:01 PM

## 2024-03-30 NOTE — Progress Notes (Signed)
 PHARMACY - ANTICOAGULATION CONSULT NOTE  Pharmacy Consult for Heparin  Indication: pulmonary embolus  Allergies  Allergen Reactions   Flomax  [Tamsulosin  Hcl] Anxiety and Other (See Comments)    Jittery     Patient Measurements: Height: 6' 2 (188 cm) Weight: 118 kg (260 lb 2.3 oz) IBW/kg (Calculated) : 82.2 HEPARIN  DW (KG): 107.3  Vital Signs: Temp: 97.9 F (36.6 C) (07/15 0000) Temp Source: Oral (07/15 0000) BP: 130/79 (07/15 0400) Pulse Rate: 117 (07/15 0400)  Labs: Recent Labs    03/28/24 0742 03/29/24 0605  HGB 8.3* 9.0*  HCT 26.0* 28.8*  PLT 312 342  CREATININE 0.88 0.95    Estimated Creatinine Clearance: 108.6 mL/min (by C-G formula based on SCr of 0.95 mg/dL).   Medical History: Past Medical History:  Diagnosis Date   Bilateral pulmonary embolism (HCC) 03/16/2024   Hypertension    Parkinson disease (HCC) 03/16/2024   Parkinson's disease (HCC)    Stroke [facility])     Assessment: Patient recently started on apixaban  prior to this admission for acute PE, which is currently on hold due to bilateral subdural hematoma. Last apixaban  dose was on 7/14 at 0932. Will monitor aPTT levels to guide therapy.   Goal of Therapy:  Heparin  level 0.3-0.5 units/ml aPTT level 66-85 Monitor platelets by anticoagulation protocol: Yes   Plan:  Start heparin  infusion at 1500 units/hr. Per provider consult, will not give a bolus given hematoma.  Will check anti-xa and aPTT level in 6 hours and daily while on heparin .  Continue to monitor H&H platelets  Feliciano GORMAN Close 03/30/2024,9:09 AM

## 2024-03-30 NOTE — Progress Notes (Addendum)
 PROGRESS NOTE    TREQUAN MARSOLEK  FMW:[id number] DOB: [birth year] DOA: 03/24/2024 PCP: [name], MD   Chief Complaint  Patient presents with   Fall    Brief Narrative:    John [name] is a 63 y.o. male with medical history significant of hypertension, CVA, Parkinson's disease, recent hospitalization within the last month, most recently end of June for acute PE, started on Eliquis , and another admission just discharged 7/7, after presenting with a concern for seizure with AKI and hyponatremia which resolved.  Patient was sent to SNF, presents to ED secondary to fall with facial trauma . - In ED patient was noted to be afebrile, but later developed fever 101.6,  While in the ED patient had been noted to have fallen out of bed and  ripped out his Foley catheter which was able to be replaced.  He remains significantly altered, confused during hospital stay, multiple attempts has been made for MRI, overall reestablishing with peripheral volatility given to noncompliance, he went for MRI brain, cervical spine, thoracic spine, lumbar spine with without contrast 7/14, which was significant for subdural hematoma and cervical spine stenosis.    Assessment & Plan:   Principal Problem:   Frequent falls Active Problems:   Laceration of head   Urinary tract infection   Acute metabolic encephalopathy   Hypomagnesemia   History of pulmonary embolism   Parkinson's disease with fluctuating manifestations (HCC)   BPH with urinary obstruction   Obesity (BMI 30-39.9)   Frequent falls Laceration to forehead - Patient has a history of gait disturbance with frequent falls likely related to his history of Parkinson disease.  Concern of Parkinson dementia as well . - Cervical spinal stenosis contributing as well to unsteady gait and falls, please see discussion below . - PT, OT following .  Cervical spinal stenosis -Neurosurgery input greatly appreciated, very difficult situation, unclear if  he is a good candidate for surgery. - Neurosurgery following, and they will reassess after mentation improved and see how is he doing with physical therapy and intubational therapy.  Subdural hematoma - This is secondary to fall. - Neurosurgery input greatly appreciated. - Okay to start heparin  drip for now pending further workup and clinical improvement and decision regarding cervical spine surgery  Acute metabolic encephalopathy - He is with acute deterioration in most likely in setting of multiple hospitalization and hospital delirium, as well due to subdural hematoma. - Patient reported gradual decline raising possibility of Parkinson dementia, but decline has been minimal and slow, so acute deterioration due to above. - Neurology input appreciated, will DC Seroquel  as may worsen Parkinson symptoms. - B12 is borderline, continue with supplements  Laceration of the head -Sutured, he is on Rocephin  mainly for UTI which should cover as well, will need sutures removed in 10 days from application (7/18)   Urinary tract infection - Prior to arrival.   - on  Rocephin     Hypomagnesemia Hypophosphatemia -Replaced   Pulmonary embolism Patient found to have bilateral pulmonary embolism during previous hospitalization - On Eliquis , currently on heparin  drip, please see above discussion   Parkinson's disease - Continue Sinemet    BPH with urinary obstruction - Failed voiding trial yesterday, Foley catheter reinserted 7/13   Obesity BMI 33.4 kg/m  Hypokalemia Hypophosphatemia -Replaced   DVT prophylaxis: Eliquis  Code Status: Full Family Communication: None at bedside, discussed with sister by phone 7/13.  7/14, 7/15 Disposition:   Status is: Inpatient    Consultants:  Neurology Neurosurgery  Subjective:  No significant events overnight, he himself denies any complaints today, Objective: Vitals:   03/29/24 2100 03/30/24 0000 03/30/24 0400 03/30/24 0912  BP:  (!)  149/90 130/79 (!) 156/101  Pulse: (!) 113 (!) 111 (!) 117   Resp: 15 20 18    Temp:  97.9 F (36.6 C)  99.4 F (37.4 C)  TempSrc:  Oral  Oral  SpO2: 98% 98% 96%   Weight:      Height:        Intake/Output Summary (Last 24 hours) at 03/30/2024 1110 Last data filed at 03/30/2024 0551 Gross per 24 hour  Intake 1800 ml  Output 2925 ml  Net -1125 ml   Filed Weights   03/24/24 0706 03/29/24 1116  Weight: 118 kg 118 kg    Examination:    Awake, alert oriented x 2 confused, but he is much more coherent and conversant and appropriate today  Significant facial bruising, wounds, please see picture below . Good air entry bilaterally  Regular rate and rhythm  Abdomen soft  Extremities with no edema.  Multiple bruises from previous falls .  Pic on 7/14   Data Reviewed: I have personally reviewed following labs and imaging studies  CBC: Recent Labs  Lab 03/24/24 0707 03/25/24 0624 03/26/24 0628 03/27/24 0624 03/28/24 0742 03/29/24 0605  WBC 9.2 7.1 5.5 5.5 5.1 5.5  NEUTROABS 6.7  --   --   --   --   --   HGB 8.9* 8.2* 8.0* 8.1* 8.3* 9.0*  HCT 28.7* 26.3* 25.6* 25.0* 26.0* 28.8*  MCV 87.8 86.5 87.4 85.6 86.1 86.0  PLT 304 277 277 298 312 342    Basic Metabolic Panel: Recent Labs  Lab 03/25/24 0624 03/26/24 0628 03/27/24 0624 03/28/24 0742 03/29/24 0605  NA 142 140 139 136 137  K 3.5 3.3* 3.7 3.7 3.9  CL 111 110 111 107 106  CO2 23 20* 20* 20* 20*  GLUCOSE 92 92 89 92 83  BUN 10 10 7* <5* <5*  CREATININE 1.40* 0.93 0.93 0.88 0.95  CALCIUM 8.6* 8.1* 8.2* 8.3* 8.6*  MG 1.7 1.8 1.6* 2.1 1.8  PHOS 2.4* 2.3* 2.4* 2.5 2.6    GFR: Estimated Creatinine Clearance: 108.6 mL/min (by C-G formula based on SCr of 0.95 mg/dL).  Liver Function Tests: Recent Labs  Lab 03/23/24 1517 03/24/24 0707  AST 19 17  ALT 6 <5  ALKPHOS 88 83  BILITOT 0.8 1.2  PROT 5.9* 5.9*  ALBUMIN 3.1* 3.0*    CBG: No results for input(s): GLUCAP in the last 168 hours.   Recent  Results (from the past 240 hours)  Urine Culture     Status: Abnormal   Collection Time: 03/23/24  6:45 PM   Specimen: Urine, Clean Catch  Result Value Ref Range Status   Specimen Description URINE, CLEAN CATCH  Final   Special Requests NONE  Final   Culture (A)  Final    >=100,000 COLONIES/mL AEROCOCCUS SPECIES Standardized susceptibility testing for this organism is not available. Performed at [facility], [address] [place], [place] [address]    Report Status 03/26/2024 FINAL  Final  Blood culture (routine x 2)     Status: None   Collection Time: 03/24/24 11:44 AM   Specimen: BLOOD LEFT ARM  Result Value Ref Range Status   Specimen Description BLOOD LEFT ARM  Final   Special Requests   Final    BOTTLES DRAWN AEROBIC AND ANAEROBIC Blood Culture results may  not be optimal due to an inadequate volume of blood received in culture bottles   Culture   Final    NO GROWTH 5 DAYS Performed at [facility], [address], [place], [place] [address]    Report Status 03/29/2024 FINAL  Final  Blood culture (routine x 2)     Status: None   Collection Time: 03/24/24 11:50 AM   Specimen: BLOOD RIGHT ARM  Result Value Ref Range Status   Specimen Description BLOOD RIGHT ARM  Final   Special Requests   Final    BOTTLES DRAWN AEROBIC AND ANAEROBIC Blood Culture results may not be optimal due to an inadequate volume of blood received in culture bottles   Culture   Final    NO GROWTH 5 DAYS Performed at [facility], [address], [place], [place] [address]    Report Status 03/29/2024 FINAL  Final         Radiology Studies: CT HEAD WO CONTRAST ( ) Result Date: 03/29/2024 CLINICAL DATA:  Subdural hematoma on MRI, will need to compare with recent CTs to evaluate for chronicity. EXAM: CT HEAD WITHOUT CONTRAST TECHNIQUE: Contiguous axial images were obtained from the base of the skull through the vertex without intravenous contrast. RADIATION DOSE REDUCTION: This  exam was performed according to the departmental dose-optimization program which includes automated exposure control, adjustment of the mA and/or kV according to patient size and/or use of iterative reconstruction technique. COMPARISON:  CT head 03/25/2024, MRI head 03/29/2024, CT max face 03/24/2024, CT head and max face 03/10/2024 FINDINGS: Brain: No evidence of large-territorial acute infarction. No parenchymal hemorrhage. No mass lesion. Stable hypodense 6 mm right calvarial convexity extra-axial fluid collection. Stable poorly visualized hypodense 4 mm left calvarial convexity extra-axial fluid collection. No mass effect or midline shift. No hydrocephalus. Basilar cisterns are patent. Vascular: No hyperdense vessel. Skull: Subacute to chronic right anterior and posterolateral maxillary sinus wall fracture. Sinuses/Orbits: Heterogeneous right maxillary mucosal thickening. Right sphenoid sinus mucosal thickening. Otherwise paranasal sinuses and mastoid air cells are clear. The orbits are unremarkable. Other: Chronic right maxillary soft tissue 1.6 x 0.7 cm fluid collection. IMPRESSION: 1. No acute intracranial hemorrhage. 2. Stable subacute to chronic 6 mm right and chronic 4 mm left subdural hematoma. 3. Chronic right maxillary soft tissue 1.6 x 0.7 cm fluid collection. 4. Subacute to chronic right anterior and posterolateral maxillary sinus wall fracture. Associated blood products within the right maxillary sinus. Electronically Signed   By: Morgane  Naveau M.D.   On: 03/29/2024 17:44   MR THORACIC SPINE W WO CONTRAST Addendum Date: 03/29/2024 ADDENDUM REPORT: 03/29/2024 17:37 ADDENDUM: Dextrocurvature of the thoracic spine. Electronically Signed   By: Rockey Childs D.O.   On: 03/29/2024 17:37   Result Date: 03/29/2024 CLINICAL DATA:  Provided history: Parkinson's disease. Multiple falls. EXAM: MRI THORACIC WITHOUT AND WITH CONTRAST TECHNIQUE: Multiplanar and multiecho pulse sequences of the thoracic spine  were obtained without and with intravenous contrast. CONTRAST:  10mL GADAVIST  GADOBUTROL  1 MMOL/ML IV SOLN COMPARISON:  Thoracic spine CT 03/26/2024. FINDINGS: Alignment: No significant spondylolisthesis. Vertebrae: Mild chronic anterior wedge deformities of the T1, T7, T11 and T12 vertebrae. Degenerative appearing edema and enhancement within the T12 vertebral body anteriorly (adjacent to a bulky T11-T12 ventral osteophyte). No significant marrow edema or focal worrisome marrow lesion identified elsewhere. Multilevel ventrolateral osteophytes/syndesmophytes, many of which are bridging. Cord: No signal abnormality identified within the thoracic spinal cord. No pathologic spinal cord enhancement. Paraspinal and other soft  tissues: Partially imaged bilateral pleural effusions. No paraspinal mass or collection. Disc levels: Multilevel disc degeneration, greatest at T6-T7, T7-T8, T8-T9 and T10-T11 (moderate-to-advanced with possible early bridging osseous fusion across the disc spaces at these levels). Multilevel shallow disc bulges, facet arthropathy and ligamentum flavum hypertrophy. Small central disc protrusion at T1-T2. No more than mild relative spinal canal narrowing. Facet arthropathy contributes to moderate left neural foraminal narrowing at T3-T4. IMPRESSION: 1. No signal abnormality identified within the thoracic spinal cord. No pathologic spinal cord enhancement. 2. Mild chronic anterior wedge deformities of the T1, T7, T11 and T12 vertebrae. 3. Thoracic spondylosis as outlined within the body of the report. No more than mild relative spinal canal narrowing. Moderate left neural foraminal narrowing at T3-T4. Disc degeneration is greatest at T6-T7, T7-T8, T8-T9 and T9-T10 (moderate-to-advanced, with possible early bridging osseous fusion across the disc spaces, at these levels). 4. Degenerative appearing edema and enhancement within the T12 vertebral body anteriorly (adjacent to a bulky T11-T12 ventral  osteophyte). 5. Multilevel ventrolateral osteophytes/syndesmophytes, many of which are bridging. 6. Partially imaged bilateral pleural effusions. Electronically Signed: By: Rockey Childs D.O. On: 03/29/2024 17:16   MR Lumbar Spine W Wo Contrast Result Date: 03/29/2024 CLINICAL DATA:  Provided history: Parkinson's disease. Multiple falls. EXAM: MRI LUMBAR SPINE WITHOUT AND WITH CONTRAST TECHNIQUE: Multiplanar and multiecho pulse sequences of the lumbar spine were obtained without and with intravenous contrast. CONTRAST:  10mL GADAVIST  GADOBUTROL  1 MMOL/ML IV SOLN COMPARISON:  Report from lumbar spine MRI 03/26/2024 (images currently unavailable). CT chest/abdomen/pelvis 03/10/2024. FINDINGS: Segmentation: 5 lumbar vertebrae. The caudal most well-formed intervertebral disc space is designated L5-S1. Alignment: Slight L1-L2 grade 1 retrolisthesis. 3 mm L2-L3 grade 1 retrolisthesis. Slight L3-L4 grade 1 retrolisthesis. 3 mm L5-S1 grade 1 retrolisthesis. Vertebrae: Partially imaged sacral edema and enhancement. An acute fracture of the S3 sacral segment was present on the prior CT chest/abdomen/pelvis 03/10/2024. No lumbar vertebral compression fracture. No significant marrow edema or focal worrisome marrow lesion within the lumbar spine. Multilevel Schmorl nodes. Bulky multilevel ventrolateral osteophytes (some bridging). Conus medullaris and cauda equina: Conus extends to the L1-L2 level. No signal abnormality identified within the visualized distal spinal cord. No pathologic enhancement of the visualized distal spinal cord. Paraspinal and other soft tissues: No acute finding within included portions of the abdomen/retroperitoneum. No paraspinal mass or collection. Disc levels: Multilevel disc degeneration, greatest at L5-S1 (moderate at this level). T12-L1: Grade 1 retrolisthesis. Small disc bulge. Facet arthropathy and ligamentum flavum hypertrophy. No significant spinal canal or foraminal stenosis. L1-L2: Grade 1  retrolisthesis. Small disc bulge. Facet arthropathy and ligamentum flavum hypertrophy. No significant spinal canal or foraminal stenosis. L2-L3: Grade 1 retrolisthesis. Disc bulge. Superimposed broad-based right foraminal disc protrusion. Facet arthropathy and ligamentum flavum hypertrophy. Mild relative right subarticular narrowing. No significant central canal stenosis. Mild relative right inferior neural foraminal narrowing. L3-L4: Grade 1 retrolisthesis. Disc bulge. Superimposed broad-based right foraminal disc protrusion. Facet and ligamentum flavum hypertrophy. Mild relative right subarticular narrowing. No significant central canal stenosis. Mild relative right inferior neural foraminal narrowing. L4-L5: Posterior annular fissure. Disc bulge with endplate spurring. Facet arthropathy and ligamentum flavum hypertrophy. Mild relative right subarticular narrowing. No significant central canal stenosis. Mild bilateral neural foraminal narrowing (greater on the left). L5-S1: Grade 1 retrolisthesis. Disc bulge with endplate spurring. Facet arthropathy with ligamentum flavum hypertrophy. No significant spinal canal stenosis. Bilateral neural foraminal narrowing (mild moderate right, moderate left). IMPRESSION: 1. Partially imaged sacral edema and enhancement. An acute fracture of the  S3 sacral segment was demonstrated on the prior CT chest/abdomen/pelvis of 03/10/2024. 2. Lumbar spondylosis as outlined within the body of the report. No significant central canal stenosis. No more than mild subarticular narrowing. Multilevel foraminal stenosis, greatest bilaterally at L5-S1 (mild-to-moderate right, moderate left). 3. Bulky multilevel ventrolateral osteophytes (some bridging). 4. Levocurvature of the lumbar spine. 5. Mild grade 1 retrolisthesis at T12-L1, L1-L2, L2-L3, L3-L4 and L5-S1. Electronically Signed   By: Rockey Childs D.O.   On: 03/29/2024 17:35   MR CERVICAL SPINE W WO CONTRAST Result Date:  03/29/2024 CLINICAL DATA:  Provided history: Parkinson's disease, multiple falls. EXAM: MRI CERVICAL SPINE WITHOUT AND WITH CONTRAST TECHNIQUE: Multiplanar and multiecho pulse sequences of the cervical spine, to include the craniocervical junction and cervicothoracic junction, were obtained without and with intravenous contrast. CONTRAST:  10mL GADAVIST  GADOBUTROL  1 MMOL/ML IV SOLN COMPARISON:  Cervical spine MRI 03/25/2024. Cervical spine CT 03/25/2024. FINDINGS: Alignment: Levocurvature of the cervical spine. Slight grade retrolisthesis at C3-C4. Slight grade 1 anterolisthesis at C4-C5. Vertebrae: Cervical vertebral body height is maintained. Mild patchy marrow edema within the posterior elements, likely degenerative and related to facet arthropathy. No significant marrow edema or focal worrisome marrow lesion identified elsewhere. Multilevel ventral osteophytes, including a bulky bridging ventral osteophyte at C3-C4, bulky ventral osteophyte at C4-C5 and somewhat bulky ventral osteophyte at C7-T1. Cord: Spinal cord flattening and apparent small focus of signal abnormality within the right aspect of the spinal cord at C3-C4. Posterior Fossa, vertebral arteries, paraspinal tissues: Intracranial findings as described on same-day brain MRI. Flow voids preserved within the visible cervical vertebral arteries. No paraspinal mass or collection. Disc levels: Suspected bridging osseous fusion across the C5-C6 disc space. Disc degeneration disc degeneration at the remaining cervical levels, greatest at C6-C7 (moderate at this level). C2-C3: Disc bulge. Facet arthropathy (greater on the right). Ligamentum flavum hypertrophy. No significant spinal canal stenosis or neural foraminal narrowing. C3-C4: Bulky posterior disc osteophyte complex with bilateral disc osteophyte ridge/uncinate hypertrophy. 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 Spinal canal stenosis with spinal cord flattening. Most notably, there is severe stenosis of the right aspect of the spinal canal with spinal cord flattening. Apparent small focus of T2 hyperintense signal abnormality within the right aspect of the spinal cord, which may reflect focal edema and/or myelomalacia (series 16, image 19). Bilateral neural foraminal narrowing (severe right, moderate-to-severe left). C4-C5: Small central disc protrusion. Facet arthropathy (greater on the right). The disc protrusion results in mild spinal canal stenosis. Mild right neural foraminal narrowing. C5-C6: Suspected bridging osseous fusion across the disc space. Facet arthropathy on the left. No significant spinal canal or foraminal stenosis. C6-C7: Shallow disc bulge. Uncovertebral hypertrophy (greater on the left). Mild facet arthropathy. No significant spinal canal stenosis. Moderate-to-severe left neural foraminal narrowing. C7-T1: Mild to moderate facet arthropathy. No significant disc herniation or stenosis. Perineural cyst within the left neural foramen. Impression #1 will be called to the ordering clinician or representative by the Radiologist Assistant, and communication documented in the PACS or [employer]. IMPRESSION: 1. Cervical spondylosis as outlined within the body of the report. 2. 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 Most notably, there is severe stenosis of the right aspect of the spinal canal with spinal cord flattening. Additionally, there is an apparent small focus of signal abnormality with right aspect of  the spinal cord at this level, which may reflect focal edema and/or myelomalacia. Bilateral neural foraminal narrowing (severe right, moderate-to-severe left). 3. No more than mild spinal canal stenosis at the remaining levels. 4. Additional sites of foraminal stenosis, greatest  on the left at C6-C7 (moderate-to-severe at this site). 5. Mild patchy edema within the posterior elements, likely degenerative and related to facet arthropathy. 6. Suspected bridging osseous fusion across the C5-C6 disc space. 7. Bulky bridging ventral osteophyte at C3-C4. 8. Levocurvature of the cervical spine. 9. Mild grade 1 spondylolisthesis at C3-C4 and C4-C5. Electronically Signed   By: Rockey Childs D.O.   On: 03/29/2024 16:59   MR BRAIN W WO CONTRAST Result Date: 03/29/2024 CLINICAL DATA:  Provided history: Progressive encephalopathy, underlying Parkinson's disease, possible dementia, recent fall with head trauma, rapid decline in mental status over the past few weeks. EXAM: MRI HEAD WITHOUT AND WITH CONTRAST TECHNIQUE: Multiplanar, multiecho pulse sequences of the brain and surrounding structures were obtained without and with intravenous contrast. CONTRAST:  10 mL Gadavist  intravenous contrast. COMPARISON:  Prior head CT examinations 03/24/2024 and earlier. FINDINGS: Brain: Subdural collections (containing small internal foci of susceptibility-weighted signal loss) overlying the bilateral cerebral convexities, consistent with subdural hematomas. The hematomas measure up to 6 mm in thickness on the right and 4 mm in thickness on the left. Balanced mass effect upon the underlying cerebral hemispheres without midline shift. Mild diffuse dural thickening enhancement along the bilateral cerebral convexities, and along the falx, nonspecific and possibly related to the subdural hematomas. Mild multifocal T2 FLAIR hyperintense signal abnormality within the cerebral white matter, nonspecific but compatible chronic small vessel ischemic disease. 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 Small cluster of chronic microhemorrhages within the anterior left frontal lobe. There is no acute infarct. No evidence of an intracranial mass. Vascular:  Maintained flow voids within the proximal large arterial vessels. Skull and upper cervical spine: No focal worrisome marrow lesion. Sinuses/Orbits: No mass or acute finding within the imaged orbits. Severe right maxillary sinusitis. Moderate right sphenoid sinusitis. Other: Small-volume fluid within right mastoid air cells. Right facial hematoma. Impression #1 called by telephone at the time of interpretation on 03/29/2024 at 4:29 pm to provider [facility] , who verbally acknowledged these results. IMPRESSION: 1. Subdural hematomas overlying the bilateral cerebral convexities (measuring up to 6 mm in thickness on the right and 4 mm in thickness on the left). Consider a repeat head CT to assess for any acute hemorrhage within the hematomas. Balanced mass effect upon the underlying cerebral hemispheres without midline shift. 2. Mild diffuse dural thickening and enhancement overlying the bilateral cerebral convexities, and along the falx, nonspecific but possibly related to the subdural hematomas. 3. Small cluster of chronic microhemorrhages within the anterior left frontal lobe. 4. Mild chronic small vessel ischemic changes within the cerebral white matter. 5. Paranasal sinus disease as described. 6. Small-volume fluid within right mastoid air cells. Electronically Signed   By: Rockey Childs D.O.   On: 03/29/2024 16:30         Scheduled Meds:  carbidopa -levodopa   1 tablet Oral 6 times per day   Chlorhexidine  Gluconate Cloth  6 each Topical Daily   cyanocobalamin   1,000 mcg Subcutaneous Daily   Followed by   NOREEN ON 04/02/2024] cyanocobalamin   1,000 mcg Subcutaneous Q30 days   QUEtiapine   50 mg Oral QHS   senna-docusate  1 tablet Oral BID   sodium chloride  flush  3 mL Intravenous Q12H   [START ON 04/03/2024] thiamine  (VITAMIN B1) injection  100 mg Intravenous Daily   vitamin E   400 Units Oral Daily   zinc  sulfate (50mg  elemental zinc )  220 mg Oral Daily   Continuous Infusions:  cefTRIAXone   (ROCEPHIN )  IV 1 g (03/30/24 0231)   heparin  1,500 Units/hr (03/30/24 1034)   lactated ringers  125 mL/hr at 03/29/24 1249   thiamine  (VITAMIN B1) injection 250 mg (03/30/24 1006)     LOS: 6 days       Brayton Lye, MD [facility]   To contact the attending provider between 7A-7P or the covering provider during after hours 7P-7A, please log into the web site [email or web address] and access using universal [place] password for that web site. If you do not have the password, please call the hospital operator.  03/30/2024, 11:10 AM

## 2024-03-30 NOTE — Progress Notes (Signed)

## 2024-03-30 NOTE — TOC Progression Note (Signed)
 Transition of Care [facility]) - Progression Note    Patient Details  Name: John [name] MRN: [id number] Date of Birth: 1961-08-27  Transition of Care [facility]) CM/SW Contact  [name], LCSW Phone Number: 03/30/2024, 9:49 AM  Clinical Narrative:    CSW continuing to follow.    Expected Discharge Plan: Skilled Nursing Facility Barriers to Discharge: Continued Medical Work up, English as a second language teacher, SNF Pending bed offer  Expected Discharge Plan and Services In-house Referral: Clinical Social Work   Post Acute Care Choice: Skilled Nursing Facility Living arrangements for the past 2 months: Single Family Home, Skilled Nursing Facility                                       Social Determinants of Health (SDOH) Interventions SDOH Screenings   Food Insecurity: Patient Unable To Answer (03/25/2024)  Housing: Unknown (03/25/2024)  Transportation Needs: Patient Unable To Answer (03/25/2024)  Utilities: Patient Unable To Answer (03/25/2024)  Depression (PHQ2-9): Low Risk  (12/10/2023)  Tobacco Use: Medium Risk (03/29/2024)    Readmission Risk Interventions     No data to display

## 2024-03-30 NOTE — Progress Notes (Signed)
 Pt ate a decent breakfast, no lunch, and slept through dinner.  MD notified and IVF ordered

## 2024-03-30 NOTE — Progress Notes (Signed)
 NEUROLOGY CONSULT FOLLOW UP NOTE   Date of service: March 30, 2024 Patient Name: John [name] MRN:  [id number] DOB:  Dec 30, 1960  Interval Hx/subjective   Patient remains hemodynamically stable and afebrile overnight.  He has been seen by neurosurgery for subdural hematomas, and they wish to do no interventions for this at this time.  There is a consideration for surgery for his cervical spinal stenosis, but it is uncertain if benefits outweigh risks.  They recommend starting heparin  for treatment of PE.  Patient continues to be disoriented to place, time and situation, has illusions (perceived speaker on the bed as an eyeball) and is speaking to people who are not in the room.  Vitals   Vitals:   03/29/24 1806 03/29/24 2100 03/30/24 0000 03/30/24 0400  BP: 99/71  (!) 149/90 130/79  Pulse:  (!) 113 (!) 111 (!) 117  Resp:  15 20 18   Temp:   97.9 F (36.6 C)   TempSrc:   Oral   SpO2:  98% 98% 96%  Weight:      Height:         Body mass index is 33.4 kg/m.  Physical Exam   Constitutional: Chronically ill-appearing elderly patient in no acute distress Psych: Calm, cooperative with exam HENT: No OP obstruction. Multiple areas of contusions, bruising, 2 lacerations on the front of his face.   Respiratory: No distress, regular unlabored GI: Soft.  No distension. There is no tenderness.  Skin: WDI.   Neurologic Examination   Neuro: Mental Status: Patient is awake, responds to name but is disoriented to place, time and situation.  He is able to follow single and two-step commands.  [place]off and trucks fly outside of his window and is talking to people who are not in the room. Cranial Nerves: II: PERRL III,IV, VI: EOMI  V: Facial sensation is symmetric to light touch VII: Facial movement is symmetric resting and smiling VIII: Hearing is intact to voice XII: Tongue protrudes midline without atrophy or fasciculations.   Motor: Able to move all 4 extremities to command with good antigravity strength Sensory: Sensation is symmetric to light touch in the arms and legs. Coordination:  Finger-to-nose intact bilaterally  Medications  Current Facility-Administered Medications:    acetaminophen  (TYLENOL ) tablet 650 mg, 650 mg, Oral, Q6H PRN, 650 mg at 03/27/24 0534 **OR** acetaminophen  (TYLENOL ) suppository 650 mg, 650 mg, Rectal, Q6H PRN, Claudene Reeves A, MD, 650 mg at 03/24/24 2329   albuterol  (PROVENTIL ) (2.5 MG/3ML) 0.083% nebulizer solution 2.5 mg, 2.5 mg, Nebulization, Q6H PRN, Claudene, Rondell A, MD   carbidopa -levodopa  (SINEMET  CR) 50-200 MG per tablet controlled release 1 tablet, 1 tablet, Oral, 6 times per day, Claudene Reeves A, MD, 1 tablet at 03/29/24 0932   cefTRIAXone  (ROCEPHIN ) 1 g in sodium chloride  0.9 % 100 mL IVPB, 1 g, Intravenous, Q24H, Smith, Rondell A, MD, Last Rate: 200 mL/hr at 03/30/24 0231, 1 g at 03/30/24 0231   Chlorhexidine  Gluconate Cloth 2 % PADS 6 each, 6 each, Topical, Daily, Claudene Reeves A, MD, 6 each at 03/28/24 9096   cyanocobalamin  (VITAMIN B12) injection 1,000 mcg, 1,000 mcg, Subcutaneous, Daily, 1,000 mcg at 03/29/24 0948 **FOLLOWED BY** [START ON 04/02/2024] cyanocobalamin  (VITAMIN B12) injection 1,000 mcg, 1,000 mcg, Subcutaneous, Q30 days, Elgergawy, Brayton RAMAN, MD   haloperidol  lactate (HALDOL ) injection 2 mg, 2 mg, Intravenous, Q6H PRN, Elgergawy, Brayton RAMAN, MD   lactated ringers  infusion, , Intravenous,  Continuous, Elgergawy, Brayton RAMAN, MD, Last Rate: 125 mL/hr at 03/29/24 1249, Restarted at 03/29/24 1520   LORazepam  (ATIVAN ) injection 0.5 mg, 0.5 mg, Intravenous, Q6H PRN, Claudene Reeves A, MD, 0.5 mg at 03/27/24 2216   ondansetron  (ZOFRAN ) tablet 4 mg, 4 mg, Oral, Q6H PRN **OR** ondansetron  (ZOFRAN ) injection 4 mg, 4 mg, Intravenous, Q6H PRN, Claudene, Rondell A, MD   QUEtiapine  (SEROQUEL ) tablet 50 mg, 50 mg, Oral, QHS, Elgergawy, Dawood S, MD, 50 mg at 03/28/24 2114    senna-docusate (Senokot-S) tablet 1 tablet, 1 tablet, Oral, BID, Elgergawy, Dawood S, MD, 1 tablet at 03/29/24 0932   sodium chloride  flush (NS) 0.9 % injection 3 mL, 3 mL, Intravenous, Q12H, Smith, Rondell A, MD, 3 mL at 03/29/24 2016   [COMPLETED] thiamine  (VITAMIN B1) 500 mg in sodium chloride  0.9 % 50 mL IVPB, 500 mg, Intravenous, Q8H, Last Rate: 110 mL/hr at 03/27/24 0530, 500 mg at 03/27/24 0530 **FOLLOWED BY** thiamine  (VITAMIN B1) 250 mg in sodium chloride  0.9 % 50 mL IVPB, 250 mg, Intravenous, Daily, Last Rate: 105 mL/hr at 03/29/24 0936, 250 mg at 03/29/24 0936 **FOLLOWED BY** [START ON 04/03/2024] thiamine  (VITAMIN B1) injection 100 mg, 100 mg, Intravenous, Daily, Bhagat, Srishti L, MD   vitamin E  capsule 400 Units, 400 Units, Oral, Daily, Elgergawy, Brayton RAMAN, MD, 400 Units at 03/29/24 9061   zinc  sulfate (50mg  elemental zinc ) capsule 220 mg, 220 mg, Oral, Daily, Elgergawy, Brayton RAMAN, MD, 220 mg at 03/28/24 0903  Labs and Diagnostic Imaging   CBC:  Recent Labs  Lab 03/24/24 0707 03/25/24 0624 03/28/24 0742 03/29/24 0605  WBC 9.2   < > 5.1 5.5  NEUTROABS 6.7  --   --   --   HGB 8.9*   < > 8.3* 9.0*  HCT 28.7*   < > 26.0* 28.8*  MCV 87.8   < > 86.1 86.0  PLT 304   < > 312 342   < > = values in this interval not displayed.    Basic Metabolic Panel:  Lab Results  Component Value Date   NA 137 03/29/2024   K 3.9 03/29/2024   CO2 20 (L) 03/29/2024   GLUCOSE 83 03/29/2024   BUN <5 (L) 03/29/2024   CREATININE 0.95 03/29/2024   CALCIUM 8.6 (L) 03/29/2024   GFRNONAA >60 03/29/2024   GFRAA 88 03/13/2020   Lipid Panel:  Lab Results  Component Value Date   LDLCALC 73 12/10/2023   Alcohol Level     Component Value Date/Time   ETH <15 03/16/2024 2143   INR  Lab Results  Component Value Date   INR 1.3 (H) 03/23/2024   Folate - 10.6 Copper  - mild elevation 135 (ref 69 - 132) Zinc  - 57 B6 2.6 B3 B1 81 16.7 Vitamin E  10.9 yeah okay to put about long-term EEG presents  with a subdural hematomas likely having subsequent seizure activity he does yeah yeah couple days you doing my push he did go to a few weeks ago he was okay for couple days PT OT he is having some accidents yeah I think he is also got some pain he is telling me that he is at the like 5 and he is seeing you trust plays outside this also yeah really doing but he does not look like he was going to try thing just just yeah so MMA  B12- 206  CT Head without contrast 7/9 and 7/10 (Personally reviewed): No acute intracranial abnormality. Patchy chronic small vessel disease  throughout the deep white matter.  CT C- spine 7/9: No acute bony abnormality. Cervical spondylosis with up to moderate to severe stenosis at C3/4   MRI brain under general anesthesia 7/14: Subdural hematomas overlying bilateral cerebral convexities, 6 mm in thickness on the right and 4 mm in thickness on the left, balanced mass effect upon underlying cerebral hemispheres without midline shift, mild diffuse dural thickening and enhancement overlying bilateral cerebral convexities along the falx, small cluster of chronic microhemorrhages within anterior left frontal lobe, mild chronic small vessel ischemic changes  MRI of the cervical, thoracic and lumbar spine under general anesthesia 7/14: Cervical spondylosis with spinal canal stenosis at C3-C4 at the right aspect of the spinal canal with spinal cord flattening, small focus of signal abnormality with right within right aspect of spinal cord at this level, bilateral neuroforaminal narrowing, additional sites of foraminal stenosis greatest on the left at C6-C7, mild patchy edema within posterior elements, likely degenerative and related to facet arthropathy, suspected bridging osseous fusion across C5-C6 disc space, bulky bridging ventral osteophyte at C3-C4, mild grade 1 spondee U lithiasis at C3-C4 and C4-C5, no signal abnormality identified within cervical spinal cord, mild chronic  anterior wedge deformities at T1 T7 T11 and T12 vertebrae, degenerative appearing edema and enhancement within T12 vertebral body, moderate neuroforaminal narrowing at T3-T4, disc degeneration greatest at T6-C7, T7-T8, T8-T9 and T9-T10, partially imaged sacral edema and enhancement, with lumbar spondylosis with no significant central canal stenosis, levocurvature of the lumbar spine  rEEG: 7/9  IMPRESSION: This study is within normal limits. No seizures or epileptiform discharges were seen throughout the recording. A normal interictal EEG does not exclude the diagnosis of epilepsy.    Assessment   HAYES CZAJA is a 63 y.o. male with a past medical history of parkinson's disease, HTN, recent PE now on eliquis  presenting after multiple falls and altered mental status.  Significant gradual decline consistent with progressive Parkinson's disease.  He was originally diagnosed with Parkinson's disease in 2022.  Unable to safely manage medications at home or maintain home in a safe living condition over the last few months.  Some possible cognitive decline as well although was still holding down his job at [employer] until disability recently approved within the past 2 months or so.  Multifactorial gait disorder due to PD, c-spine stenosis, back pain, orthostasis   On 7/14, patient underwent MRI of the brain and total spine under anesthesia.  Small bilateral subdurals were noted. Head CT showed no acute blood. Neurosurgery was consulted by hospitalist who recommended holding eliquis . Dr. Sherlon d/w Dr. Matthews by phone and stated that he felt comfortable with this since his PE were 2 weeks ago and no DVT identified on imaging.   Neurosurgery has evaluated patient does not wish to intervene for subdural hematomas.  They recommend starting heparin  to treat pulmonary embolus as it can be discontinued abruptly.  They are considering surgery for cervical spinal stenosis  Given patient's continued altered  mental status and presence of subdural hematoma, subclinical seizure activity is a concern.  Will place patient on LTM EEG to assess for this.  Also, given hallucinations and illusions as well as fluctuating course of mental status, delirium is a likely component of patient's presentation.   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 - Critical cervical spinal stenosis - Potential postconcussive syndrome - Unlikely underlying seizure, though no clear description of seizures - Delirium in the setting  of pain, hospitalization, head injury, medications   Recommendations  -Holding eliquis  for now see above, okay with starting heparin  per neurosurgery recommendations - No indication for ICU or strict BP goals given subdurals appear chronic, patient has remained <160 SBP past 24 hrs - Repeat head CT stat with any change in exam - TED stockings and abdominal binder to be used only before ambulation (remove when at rest) - Low normal B12 at 206, MMA normal but still recommend goal > 600 - Follow up pending labs:  B3 supplement as need needed  - Please continue carbidopa /levodopa  at his home dose - LTM EEG - Delirium precautions -Would discontinue Haldol  and minimize use of Seroquel  as these can exacerbate parkinsonian symptoms -Continue home dose of Sinemet  - Neurology will continue to follow ______________________________________________________________________  Patient seen by NP and then by MD, MD to edit note as needed. Cortney E Everitt Clint Kill , MSN, AGACNP-BC [facility] See Amion for schedule and pager information 03/30/2024 8:31 AM     Attending Neurohospitalist Addendum Patient seen and examined with APP/Resident. Agree with the history and physical as documented above. Agree with the plan as documented, which I helped formulate. I have edited the note above to reflect my full findings  and recommendations. I have independently reviewed the chart, obtained history, review of systems and examined the patient.I have personally reviewed pertinent head/neck/spine imaging (CT/MRI). Please feel free to call with any questions.  -- Elida Ross, MD [facility] [phone]  If 7pm- 7am, please page neurology on call as listed in AMION.

## 2024-03-31 ENCOUNTER — Encounter (HOSPITAL_COMMUNITY)

## 2024-03-31 ENCOUNTER — Inpatient Hospital Stay (HOSPITAL_COMMUNITY)

## 2024-03-31 DIAGNOSIS — R296 Repeated falls: Secondary | ICD-10-CM | POA: Diagnosis not present

## 2024-03-31 DIAGNOSIS — R569 Unspecified convulsions: Secondary | ICD-10-CM | POA: Diagnosis not present

## 2024-03-31 DIAGNOSIS — N401 Enlarged prostate with lower urinary tract symptoms: Secondary | ICD-10-CM | POA: Diagnosis not present

## 2024-03-31 DIAGNOSIS — G20B2 Parkinson's disease with dyskinesia, with fluctuations: Secondary | ICD-10-CM | POA: Diagnosis not present

## 2024-03-31 DIAGNOSIS — R410 Disorientation, unspecified: Secondary | ICD-10-CM | POA: Diagnosis not present

## 2024-03-31 DIAGNOSIS — G9341 Metabolic encephalopathy: Secondary | ICD-10-CM | POA: Diagnosis not present

## 2024-03-31 DIAGNOSIS — M4802 Spinal stenosis, cervical region: Secondary | ICD-10-CM | POA: Diagnosis not present

## 2024-03-31 DIAGNOSIS — I6203 Nontraumatic chronic subdural hemorrhage: Secondary | ICD-10-CM | POA: Diagnosis not present

## 2024-03-31 DIAGNOSIS — G20A1 Parkinson's disease without dyskinesia, without mention of fluctuations: Secondary | ICD-10-CM | POA: Diagnosis not present

## 2024-03-31 LAB — HEPARIN LEVEL (UNFRACTIONATED)
Heparin Unfractionated: >1.1 [IU]/mL — ABNORMAL HIGH (ref 0.30–0.70)
Heparin Unfractionated: 0.69 [IU]/mL (ref 0.30–0.70)
Heparin Unfractionated: 1 [IU]/mL — ABNORMAL HIGH (ref 0.30–0.70)

## 2024-03-31 LAB — APTT
aPTT: 138 s — ABNORMAL HIGH (ref 24–36)
aPTT: 52 s — ABNORMAL HIGH (ref 24–36)
aPTT: 84 s — ABNORMAL HIGH (ref 24–36)

## 2024-03-31 MED ORDER — VITAMIN B-6 25 MG PO TABS
50.0000 mg | ORAL_TABLET | Freq: Every day | ORAL | Status: DC
  Administered 2024-03-31 – 2024-04-16 (×17): 50 mg via ORAL
  Filled 2024-03-31 (×17): qty 2

## 2024-03-31 NOTE — Progress Notes (Addendum)
 PROGRESS NOTE        PATIENT DETAILS Name: John [name] Age: 63 y.o. Sex: male Date of Birth: Dec 12, 1960 Admit Date: 03/24/2024 Admitting Physician [name], MD [name], [name], MD  Brief Summary: Patient is a 63 y.o.  male with history of Parkinson's disease, recent PE on anticoagulation-presented with recurrent falls-confusion.  Underwent multiple attempts for MRI imaging of brain/spine-but due to lack of patient cooperation-poor study due to significant motion artifact, subsequently underwent MRI under anesthesia-which showed SDH and severe C-spine stenosis.  Significant events: 7/9>> admit to [facility]  Significant studies: 7/8>> CXR: No PNA 7/8>> x-ray pelvis: No fracture.  7/8>> CT head: No acute intracranial abnormality 7/8>> CT maxillofacial: Redemonstrated fractures involving the right maxillary sinus/orbit-stable since 6/25.  No new fractures. 7/8>> CT C-spine: No fracture. 7/9>> x-ray left knee: No fracture. 7/9>> CT head: No acute intracranial abnormality. 7/9>> CT C-spine: No acute fracture. 7/10>> MRI C-spine/T-spine/L-spine: Motion artifact-limited study. 7/14>> MRI brain: SDH overlying bilateral cerebral convexities. 7/14>> MRI C-spine: Severe spinal canal stenosis 7/14>> MRI T-spine: No signal abnormality within the thoracic spinal cord. 7/14>> MRI L-spine: Acute fracture of the S3 sacral segment (demonstrated on prior CT 6/25).  Lumbar spondylosis. 7/14>> CT head: Stable subacute to chronic 6 mm right and chronic 4 mm left subdural hematoma. 7/15-7/16>> LTM EEG: No seizures.  Significant microbiology data: 7/9>> blood culture:  Procedures: None  Consults: Neurology Neurosurgery  Subjective: Lying comfortably in bed-denies any chest pain or shortness of breath.  Objective: Vitals: Blood pressure (!) 166/93, pulse 86, temperature 97.6 F (36.4 C), temperature source Oral, resp. rate 14, height 6' 2 (1.88 m), weight  118 kg, SpO2 99%.   Exam: Gen Exam:Alert awake-not in any distress HEENT:atraumatic, normocephalic Chest: B/L clear to auscultation anteriorly CVS:S1S2 regular Abdomen:soft non tender, non distended Extremities:no edema Neurology: Non focal Skin: no rash  Pertinent Labs/Radiology:    Latest Ref Rng & Units 03/30/2024   10:15 AM 03/29/2024    6:05 AM 03/28/2024    7:42 AM  CBC  WBC 4.0 - 10.5 K/uL 9.8  5.5  5.1   Hemoglobin 13.0 - 17.0 g/dL 9.7  9.0  8.3   Hematocrit 39.0 - 52.0 % 31.0  28.8  26.0   Platelets 150 - 400 K/uL 399  342  312     Lab Results  Component Value Date   NA 141 03/30/2024   K 4.2 03/30/2024   CL 107 03/30/2024   CO2 21 (L) 03/30/2024      Assessment/Plan: Frequent falls with right cheek hematoma, left forehead laceration (sutured 7/8 in the ED) right maxillary sinus and orbit fracture (present prior to this admit), S3 sacral segment fracture (present prior to this admit) Likely related to gait instability/autonomic dysfunction in the setting of Parkinson's disease-along with degenerative disc disease/C-spine stenosis. PT/OT eval SNF recommended Remove sutures around 7/18   SDH Traumatic Tolerating IV heparin  Neurosurgery following-repeat CT planned next several days.  Cervical spinal stenosis Neurosurgery following-would ongoing considerations for surgical intervention at some point-based on patient's clinical progress.  Acute metabolic encephalopathy Felt to be multifactorial-felt to be secondary to progressive Parkinson's disease related dementia-superimposed on potential postconcussive syndrome/SDH/borderline vitamin B12 deficiency/UTI Mental status improving with supportive care LTM EEG negative for seizures. Allergy following.  Complicated UTI Completed IV Rocephin  course-unfortunately urine culture was never sent.  Parkinson's disease  Continue Sinemet   Recent pulmonary embolism On IV heparin  given SDH Once okay with  neurosurgery-switch to DOAC.  BPH with acute urinary retention Failed voiding trial-Foley catheter reinserted 7/13 Outpatient follow-up with urology-will DC with Foley catheter.  Borderline vitamin B12 deficiency Being supplemented.  B6 deficiency Start supplementation.  Class 1 Obesity: Estimated body mass index is 33.4 kg/m as calculated from the following:   Height as of this encounter: 6' 2 (1.88 m).   Weight as of this encounter: 118 kg.   Code status:   Code Status: Full Code   DVT Prophylaxis: Place TED hose Start: 03/26/24 0929 IV heparin    Family Communication: None at bedside   Disposition Plan: Status is: Inpatient Remains inpatient appropriate because: Severity of illness   Planned Discharge Destination:Skilled nursing facility   Diet: Diet Order             Diet regular Room service appropriate? Yes; Fluid consistency: Thin  Diet effective now                     Antimicrobial agents: Anti-infectives (From admission, onward)    Start     Dose/Rate Route Frequency Ordered Stop   03/24/24 2000  cefTRIAXone  (ROCEPHIN ) 1 g in sodium chloride  0.9 % 100 mL IVPB        1 g 200 mL/hr over 30 Minutes Intravenous Every 24 hours 03/24/24 1150 03/30/24 2226        MEDICATIONS: Scheduled Meds:  carbidopa -levodopa   1 tablet Oral 6 times per day   Chlorhexidine  Gluconate Cloth  6 each Topical Daily   cyanocobalamin   1,000 mcg Subcutaneous Daily   Followed by   NOREEN ON 04/02/2024] cyanocobalamin   1,000 mcg Subcutaneous Q30 days   senna-docusate  1 tablet Oral BID   sodium chloride  flush  3 mL Intravenous Q12H   [START ON 04/03/2024] thiamine  (VITAMIN B1) injection  100 mg Intravenous Daily   vitamin E   400 Units Oral Daily   zinc  sulfate (50mg  elemental zinc )  220 mg Oral Daily   Continuous Infusions:  heparin  1,900 Units/hr (03/31/24 0643)   lactated ringers  75 mL/hr at 03/31/24 0325   thiamine  (VITAMIN B1) injection Stopped (03/30/24 1040)    PRN Meds:.acetaminophen  **OR** acetaminophen , albuterol , LORazepam , ondansetron  **OR** ondansetron  (ZOFRAN ) IV, sodium chloride  flush   I have personally reviewed following labs and imaging studies  LABORATORY DATA: CBC: Recent Labs  Lab 03/26/24 0628 03/27/24 0624 03/28/24 0742 03/29/24 0605 03/30/24 1015  WBC 5.5 5.5 5.1 5.5 9.8  HGB 8.0* 8.1* 8.3* 9.0* 9.7*  HCT 25.6* 25.0* 26.0* 28.8* 31.0*  MCV 87.4 85.6 86.1 86.0 86.4  PLT 277 298 312 342 399    Basic Metabolic Panel: Recent Labs  Lab 03/26/24 0628 03/27/24 0624 03/28/24 0742 03/29/24 0605 03/30/24 1015  NA 140 139 136 137 141  K 3.3* 3.7 3.7 3.9 4.2  CL 110 111 107 106 107  CO2 20* 20* 20* 20* 21*  GLUCOSE 92 89 92 83 89  BUN 10 7* <5* <5* 12  CREATININE 0.93 0.93 0.88 0.95 1.07  CALCIUM 8.1* 8.2* 8.3* 8.6* 9.0  MG 1.8 1.6* 2.1 1.8 2.0  PHOS 2.3* 2.4* 2.5 2.6 1.5*    GFR: Estimated Creatinine Clearance: 96.4 mL/min (by C-G formula based on SCr of 1.07 mg/dL).  Liver Function Tests: No results for input(s): AST, ALT, ALKPHOS, BILITOT, PROT, ALBUMIN in the last 168 hours. No results for input(s): LIPASE, AMYLASE in the last 168 hours. No results  for input(s): AMMONIA in the last 168 hours.  Coagulation Profile: No results for input(s): INR, PROTIME in the last 168 hours.  Cardiac Enzymes: No results for input(s): CKTOTAL, CKMB, CKMBINDEX, TROPONINI in the last 168 hours.  BNP (last 3 results) No results for input(s): PROBNP in the last 8760 hours.  Lipid Profile: No results for input(s): CHOL, HDL, LDLCALC, TRIG, CHOLHDL, LDLDIRECT in the last 72 hours.  Thyroid Function Tests: No results for input(s): TSH, T4TOTAL, FREET4, T3FREE, THYROIDAB in the last 72 hours.  Anemia Panel: No results for input(s): VITAMINB12, FOLATE, FERRITIN, TIBC, IRON, RETICCTPCT in the last 72 hours.  Urine analysis:    Component Value Date/Time    COLORURINE AMBER (A) 03/23/2024 1845   APPEARANCEUR HAZY (A) 03/23/2024 1845   LABSPEC 1.016 03/23/2024 1845   LABSPEC 1.010 02/10/2024 1448   PHURINE 6.0 03/23/2024 1845   GLUCOSEU NEGATIVE 03/23/2024 1845   HGBUR SMALL (A) 03/23/2024 1845   BILIRUBINUR NEGATIVE 03/23/2024 1845   BILIRUBINUR negative 02/10/2024 1448   KETONESUR 5 (A) 03/23/2024 1845   PROTEINUR NEGATIVE 03/23/2024 1845   NITRITE NEGATIVE 03/23/2024 1845   LEUKOCYTESUR NEGATIVE 03/23/2024 1845    Sepsis Labs: Lactic Acid, Venous    Component Value Date/Time   LATICACIDVEN 1.2 03/16/2024 2143    MICROBIOLOGY: Recent Results (from the past 240 hours)  Urine Culture     Status: Abnormal   Collection Time: 03/23/24  6:45 PM   Specimen: Urine, Clean Catch  Result Value Ref Range Status   Specimen Description URINE, CLEAN CATCH  Final   Special Requests NONE  Final   Culture (A)  Final    >=100,000 COLONIES/mL AEROCOCCUS SPECIES Standardized susceptibility testing for this organism is not available. Performed at [facility], [address] [place], [place] [address]    Report Status 03/26/2024 FINAL  Final  Blood culture (routine x 2)     Status: None   Collection Time: 03/24/24 11:44 AM   Specimen: BLOOD LEFT ARM  Result Value Ref Range Status   Specimen Description BLOOD LEFT ARM  Final   Special Requests   Final    BOTTLES DRAWN AEROBIC AND ANAEROBIC Blood Culture results may not be optimal due to an inadequate volume of blood received in culture bottles   Culture   Final    NO GROWTH 5 DAYS Performed at [facility], [address], [place], [place] [address]    Report Status 03/29/2024 FINAL  Final  Blood culture (routine x 2)     Status: None   Collection Time: 03/24/24 11:50 AM   Specimen: BLOOD RIGHT ARM  Result Value Ref Range Status   Specimen Description BLOOD RIGHT ARM  Final   Special Requests   Final    BOTTLES DRAWN AEROBIC AND ANAEROBIC Blood Culture results may not be  optimal due to an inadequate volume of blood received in culture bottles   Culture   Final    NO GROWTH 5 DAYS Performed at [facility], [address] [place], [place] [address]    Report Status 03/29/2024 FINAL  Final    RADIOLOGY STUDIES/RESULTS: Overnight EEG with video Result Date: 03/31/2024 [name], MD     03/31/2024  9:21 AM Patient Name: POLO MCMARTIN MRN: [id number] Epilepsy Attending: Arlin KIDD Shelton Referring Physician/Provider: everitt Clint Abbey Earle FORBES, NP Duration: 03/30/2024 0956 to 03/31/2024 0900  Patient history:  63 y.o. male with a past medical history of parkinson's  disease, HTN, recent PE now on eliquis  presenting after multiple falls and altered mental status. EEG to evaluate for seizure  Level of alertness: Awake, asleep  AEDs during EEG study: None Technical aspects: This EEG study was done with scalp electrodes positioned according to the 10-20 International system of electrode placement. Electrical activity was reviewed with band pass filter of 1-70Hz , sensitivity of 7 uV/mm, display speed of 93mm/sec with a 60Hz  notched filter applied as appropriate. EEG data were recorded continuously and digitally stored.  Video monitoring was available and reviewed as appropriate.  Description: The posterior dominant rhythm consists of 8 Hz activity of moderate voltage (25-35 uV) seen predominantly in posterior head regions, symmetric and reactive to eye opening and eye closing. Sleep was characterized by sleep spindles (12-14hz ), maximum frontocentral region. EEG showed intermittent generalized 3 to paresthesia. ABNORMALITY - Intermittent slow, generalized IMPRESSION: This study is suggestive of mild diffuse encephalopathy.  No seizures or epileptiform discharges were seen throughout the recording.  Priyanka MALVA Krebs   CT HEAD WO CONTRAST ( ) Result Date: 03/29/2024 CLINICAL DATA:  Subdural hematoma on MRI, will need to compare with recent CTs to evaluate for chronicity.  EXAM: CT HEAD WITHOUT CONTRAST TECHNIQUE: Contiguous axial images were obtained from the base of the skull through the vertex without intravenous contrast. RADIATION DOSE REDUCTION: This exam was performed according to the departmental dose-optimization program which includes automated exposure control, adjustment of the mA and/or kV according to patient size and/or use of iterative reconstruction technique. COMPARISON:  CT head 03/25/2024, MRI head 03/29/2024, CT max face 03/24/2024, CT head and max face 03/10/2024 FINDINGS: Brain: No evidence of large-territorial acute infarction. No parenchymal hemorrhage. No mass lesion. Stable hypodense 6 mm right calvarial convexity extra-axial fluid collection. Stable poorly visualized hypodense 4 mm left calvarial convexity extra-axial fluid collection. No mass effect or midline shift. No hydrocephalus. Basilar cisterns are patent. Vascular: No hyperdense vessel. Skull: Subacute to chronic right anterior and posterolateral maxillary sinus wall fracture. Sinuses/Orbits: Heterogeneous right maxillary mucosal thickening. Right sphenoid sinus mucosal thickening. Otherwise paranasal sinuses and mastoid air cells are clear. The orbits are unremarkable. Other: Chronic right maxillary soft tissue 1.6 x 0.7 cm fluid collection. IMPRESSION: 1. No acute intracranial hemorrhage. 2. Stable subacute to chronic 6 mm right and chronic 4 mm left subdural hematoma. 3. Chronic right maxillary soft tissue 1.6 x 0.7 cm fluid collection. 4. Subacute to chronic right anterior and posterolateral maxillary sinus wall fracture. Associated blood products within the right maxillary sinus. Electronically Signed   By: Morgane  Naveau M.D.   On: 03/29/2024 17:44   MR THORACIC SPINE W WO CONTRAST Addendum Date: 03/29/2024 ADDENDUM REPORT: 03/29/2024 17:37 ADDENDUM: Dextrocurvature of the thoracic spine. Electronically Signed   By: Rockey Childs D.O.   On: 03/29/2024 17:37   Result Date:  03/29/2024 CLINICAL DATA:  Provided history: Parkinson's disease. Multiple falls. EXAM: MRI THORACIC WITHOUT AND WITH CONTRAST TECHNIQUE: Multiplanar and multiecho pulse sequences of the thoracic spine were obtained without and with intravenous contrast. CONTRAST:  10mL GADAVIST  GADOBUTROL  1 MMOL/ML IV SOLN COMPARISON:  Thoracic spine CT 03/26/2024. FINDINGS: Alignment: No significant spondylolisthesis. Vertebrae: Mild chronic anterior wedge deformities of the T1, T7, T11 and T12 vertebrae. Degenerative appearing edema and enhancement within the T12 vertebral body anteriorly (adjacent to a bulky T11-T12 ventral osteophyte). No significant marrow edema or focal worrisome marrow lesion identified elsewhere. Multilevel ventrolateral osteophytes/syndesmophytes, many of which are bridging. Cord: No signal abnormality identified within the thoracic spinal cord. No pathologic  spinal cord enhancement. Paraspinal and other soft tissues: Partially imaged bilateral pleural effusions. No paraspinal mass or collection. Disc levels: Multilevel disc degeneration, greatest at T6-T7, T7-T8, T8-T9 and T10-T11 (moderate-to-advanced with possible early bridging osseous fusion across the disc spaces at these levels). Multilevel shallow disc bulges, facet arthropathy and ligamentum flavum hypertrophy. Small central disc protrusion at T1-T2. No more than mild relative spinal canal narrowing. Facet arthropathy contributes to moderate left neural foraminal narrowing at T3-T4. IMPRESSION: 1. No signal abnormality identified within the thoracic spinal cord. No pathologic spinal cord enhancement. 2. Mild chronic anterior wedge deformities of the T1, T7, T11 and T12 vertebrae. 3. Thoracic spondylosis as outlined within the body of the report. No more than mild relative spinal canal narrowing. Moderate left neural foraminal narrowing at T3-T4. Disc degeneration is greatest at T6-T7, T7-T8, T8-T9 and T9-T10 (moderate-to-advanced, with possible  early bridging osseous fusion across the disc spaces, at these levels). 4. Degenerative appearing edema and enhancement within the T12 vertebral body anteriorly (adjacent to a bulky T11-T12 ventral osteophyte). 5. Multilevel ventrolateral osteophytes/syndesmophytes, many of which are bridging. 6. Partially imaged bilateral pleural effusions. Electronically Signed: By: Rockey Childs D.O. On: 03/29/2024 17:16   MR Lumbar Spine W Wo Contrast Result Date: 03/29/2024 CLINICAL DATA:  Provided history: Parkinson's disease. Multiple falls. EXAM: MRI LUMBAR SPINE WITHOUT AND WITH CONTRAST TECHNIQUE: Multiplanar and multiecho pulse sequences of the lumbar spine were obtained without and with intravenous contrast. CONTRAST:  10mL GADAVIST  GADOBUTROL  1 MMOL/ML IV SOLN COMPARISON:  Report from lumbar spine MRI 03/26/2024 (images currently unavailable). CT chest/abdomen/pelvis 03/10/2024. FINDINGS: Segmentation: 5 lumbar vertebrae. The caudal most well-formed intervertebral disc space is designated L5-S1. Alignment: Slight L1-L2 grade 1 retrolisthesis. 3 mm L2-L3 grade 1 retrolisthesis. Slight L3-L4 grade 1 retrolisthesis. 3 mm L5-S1 grade 1 retrolisthesis. Vertebrae: Partially imaged sacral edema and enhancement. An acute fracture of the S3 sacral segment was present on the prior CT chest/abdomen/pelvis 03/10/2024. No lumbar vertebral compression fracture. No significant marrow edema or focal worrisome marrow lesion within the lumbar spine. Multilevel Schmorl nodes. Bulky multilevel ventrolateral osteophytes (some bridging). Conus medullaris and cauda equina: Conus extends to the L1-L2 level. No signal abnormality identified within the visualized distal spinal cord. No pathologic enhancement of the visualized distal spinal cord. Paraspinal and other soft tissues: No acute finding within included portions of the abdomen/retroperitoneum. No paraspinal mass or collection. Disc levels: Multilevel disc degeneration, greatest at  L5-S1 (moderate at this level). T12-L1: Grade 1 retrolisthesis. Small disc bulge. Facet arthropathy and ligamentum flavum hypertrophy. No significant spinal canal or foraminal stenosis. L1-L2: Grade 1 retrolisthesis. Small disc bulge. Facet arthropathy and ligamentum flavum hypertrophy. No significant spinal canal or foraminal stenosis. L2-L3: Grade 1 retrolisthesis. Disc bulge. Superimposed broad-based right foraminal disc protrusion. Facet arthropathy and ligamentum flavum hypertrophy. Mild relative right subarticular narrowing. No significant central canal stenosis. Mild relative right inferior neural foraminal narrowing. L3-L4: Grade 1 retrolisthesis. Disc bulge. Superimposed broad-based right foraminal disc protrusion. Facet and ligamentum flavum hypertrophy. Mild relative right subarticular narrowing. No significant central canal stenosis. Mild relative right inferior neural foraminal narrowing. L4-L5: Posterior annular fissure. Disc bulge with endplate spurring. Facet arthropathy and ligamentum flavum hypertrophy. Mild relative right subarticular narrowing. No significant central canal stenosis. Mild bilateral neural foraminal narrowing (greater on the left). L5-S1: Grade 1 retrolisthesis. Disc bulge with endplate spurring. Facet arthropathy with ligamentum flavum hypertrophy. No significant spinal canal stenosis. Bilateral neural foraminal narrowing (mild moderate right, moderate left). IMPRESSION: 1. Partially imaged sacral edema  and enhancement. An acute fracture of the S3 sacral segment was demonstrated on the prior CT chest/abdomen/pelvis of 03/10/2024. 2. Lumbar spondylosis as outlined within the body of the report. No significant central canal stenosis. No more than mild subarticular narrowing. Multilevel foraminal stenosis, greatest bilaterally at L5-S1 (mild-to-moderate right, moderate left). 3. Bulky multilevel ventrolateral osteophytes (some bridging). 4. Levocurvature of the lumbar spine. 5. Mild  grade 1 retrolisthesis at T12-L1, L1-L2, L2-L3, L3-L4 and L5-S1. Electronically Signed   By: Rockey Childs D.O.   On: 03/29/2024 17:35   MR CERVICAL SPINE W WO CONTRAST Result Date: 03/29/2024 CLINICAL DATA:  Provided history: Parkinson's disease, multiple falls. EXAM: MRI CERVICAL SPINE WITHOUT AND WITH CONTRAST TECHNIQUE: Multiplanar and multiecho pulse sequences of the cervical spine, to include the craniocervical junction and cervicothoracic junction, were obtained without and with intravenous contrast. CONTRAST:  10mL GADAVIST  GADOBUTROL  1 MMOL/ML IV SOLN COMPARISON:  Cervical spine MRI 03/25/2024. Cervical spine CT 03/25/2024. FINDINGS: Alignment: Levocurvature of the cervical spine. Slight grade retrolisthesis at C3-C4. Slight grade 1 anterolisthesis at C4-C5. Vertebrae: Cervical vertebral body height is maintained. Mild patchy marrow edema within the posterior elements, likely degenerative and related to facet arthropathy. No significant marrow edema or focal worrisome marrow lesion identified elsewhere. Multilevel ventral osteophytes, including a bulky bridging ventral osteophyte at C3-C4, bulky ventral osteophyte at C4-C5 and somewhat bulky ventral osteophyte at C7-T1. Cord: Spinal cord flattening and apparent small focus of signal abnormality within the right aspect of the spinal cord at C3-C4. Posterior Fossa, vertebral arteries, paraspinal tissues: Intracranial findings as described on same-day brain MRI. Flow voids preserved within the visible cervical vertebral arteries. No paraspinal mass or collection. Disc levels: Suspected bridging osseous fusion across the C5-C6 disc space. Disc degeneration disc degeneration at the remaining cervical levels, greatest at C6-C7 (moderate at this level). C2-C3: Disc bulge. Facet arthropathy (greater on the right). Ligamentum flavum hypertrophy. No significant spinal canal stenosis or neural foraminal narrowing. C3-C4: Bulky posterior disc osteophyte complex with  bilateral disc osteophyte ridge/uncinate hypertrophy. 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 Spinal canal stenosis with spinal cord flattening. Most notably, there is severe stenosis of the right aspect of the spinal canal with spinal cord flattening. Apparent small focus of T2 hyperintense signal abnormality within the right aspect of the spinal cord, which may reflect focal edema and/or myelomalacia (series 16, image 19). Bilateral neural foraminal narrowing (severe right, moderate-to-severe left). C4-C5: Small central disc protrusion. Facet arthropathy (greater on the right). The disc protrusion results in mild spinal canal stenosis. Mild right neural foraminal narrowing. C5-C6: Suspected bridging osseous fusion across the disc space. Facet arthropathy on the left. No significant spinal canal or foraminal stenosis. C6-C7: Shallow disc bulge. Uncovertebral hypertrophy (greater on the left). Mild facet arthropathy. No significant spinal canal stenosis. Moderate-to-severe left neural foraminal narrowing. C7-T1: Mild to moderate facet arthropathy. No significant disc herniation or stenosis. Perineural cyst within the left neural foramen. Impression #1 will be called to the ordering clinician or representative by the Radiologist Assistant, and communication documented in the PACS or [employer]. IMPRESSION: 1. Cervical spondylosis as outlined within the body of the report. 2. 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 Most notably, there is severe stenosis of the right aspect of the spinal canal with spinal cord flattening. Additionally, there is an apparent small focus  of signal abnormality with right aspect of the spinal cord at this level, which may reflect focal edema and/or myelomalacia. Bilateral  neural foraminal narrowing (severe right, moderate-to-severe left). 3. No more than mild spinal canal stenosis at the remaining levels. 4. Additional sites of foraminal stenosis, greatest on the left at C6-C7 (moderate-to-severe at this site). 5. Mild patchy edema within the posterior elements, likely degenerative and related to facet arthropathy. 6. Suspected bridging osseous fusion across the C5-C6 disc space. 7. Bulky bridging ventral osteophyte at C3-C4. 8. Levocurvature of the cervical spine. 9. Mild grade 1 spondylolisthesis at C3-C4 and C4-C5. Electronically Signed   By: Rockey Childs D.O.   On: 03/29/2024 16:59   MR BRAIN W WO CONTRAST Result Date: 03/29/2024 CLINICAL DATA:  Provided history: Progressive encephalopathy, underlying Parkinson's disease, possible dementia, recent fall with head trauma, rapid decline in mental status over the past few weeks. EXAM: MRI HEAD WITHOUT AND WITH CONTRAST TECHNIQUE: Multiplanar, multiecho pulse sequences of the brain and surrounding structures were obtained without and with intravenous contrast. CONTRAST:  10 mL Gadavist  intravenous contrast. COMPARISON:  Prior head CT examinations 03/24/2024 and earlier. FINDINGS: Brain: Subdural collections (containing small internal foci of susceptibility-weighted signal loss) overlying the bilateral cerebral convexities, consistent with subdural hematomas. The hematomas measure up to 6 mm in thickness on the right and 4 mm in thickness on the left. Balanced mass effect upon the underlying cerebral hemispheres without midline shift. Mild diffuse dural thickening enhancement along the bilateral cerebral convexities, and along the falx, nonspecific and possibly related to the subdural hematomas. Mild multifocal T2 FLAIR hyperintense signal abnormality within the cerebral white matter, nonspecific but compatible chronic small vessel ischemic disease. 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 Small cluster of chronic microhemorrhages within the anterior left frontal lobe. There is no acute infarct. No evidence of an intracranial mass. Vascular: Maintained flow voids within the proximal large arterial vessels. Skull and upper cervical spine: No focal worrisome marrow lesion. Sinuses/Orbits: No mass or acute finding within the imaged orbits. Severe right maxillary sinusitis. Moderate right sphenoid sinusitis. Other: Small-volume fluid within right mastoid air cells. Right facial hematoma. Impression #1 called by telephone at the time of interpretation on 03/29/2024 at 4:29 pm to provider [facility] , who verbally acknowledged these results. IMPRESSION: 1. Subdural hematomas overlying the bilateral cerebral convexities (measuring up to 6 mm in thickness on the right and 4 mm in thickness on the left). Consider a repeat head CT to assess for any acute hemorrhage within the hematomas. Balanced mass effect upon the underlying cerebral hemispheres without midline shift. 2. Mild diffuse dural thickening and enhancement overlying the bilateral cerebral convexities, and along the falx, nonspecific but possibly related to the subdural hematomas. 3. Small cluster of chronic microhemorrhages within the anterior left frontal lobe. 4. Mild chronic small vessel ischemic changes within the cerebral white matter. 5. Paranasal sinus disease as described. 6. Small-volume fluid within right mastoid air cells. Electronically Signed   By: Rockey Childs D.O.   On: 03/29/2024 16:30     LOS: 7 days   Donalda Applebaum, MD  [facility]    To contact the attending provider between 7A-7P or the covering provider during after hours 7P-7A, please log into the web site [email or web address] and access using universal  password for that web site. If you do not have the password, please call the hospital operator.  03/31/2024, 9:42 AM

## 2024-03-31 NOTE — Procedures (Addendum)
 Patient Name: John [name]  MRN: [id number]  Epilepsy Attending: Arlin MALVA Krebs  Referring Physician/Provider: everitt Clint Abbey Earle FORBES, NP  Duration: 03/30/2024 9043 to 03/31/2024 9043   Patient history:  63 y.o. male with a past medical history of parkinson's disease, HTN, recent PE now on eliquis  presenting after multiple falls and altered mental status. EEG to evaluate for seizure    Level of alertness: Awake, asleep   AEDs during EEG study: None  Technical aspects: This EEG study was done with scalp electrodes positioned according to the 10-20 International system of electrode placement. Electrical activity was reviewed with band pass filter of 1-70Hz , sensitivity of 7 uV/mm, display speed of 73mm/sec with a 60Hz  notched filter applied as appropriate. EEG data were recorded continuously and digitally stored.  Video monitoring was available and reviewed as appropriate.   Description: The posterior dominant rhythm consists of 8 Hz activity of moderate voltage (25-35 uV) seen predominantly in posterior head regions, symmetric and reactive to eye opening and eye closing. Sleep was characterized by sleep spindles (12-14hz ), maximum frontocentral region. EEG showed intermittent generalized 3 to paresthesia.  ABNORMALITY - Intermittent slow, generalized  IMPRESSION: This study is suggestive of mild diffuse encephalopathy.  No seizures or epileptiform discharges were seen throughout the recording.    Shawna Wearing O Hanae Waiters

## 2024-03-31 NOTE — Progress Notes (Addendum)
 LTM maint complete - no skin breakdown under: FP1,F8,T8  [facility] now monitoring EEG connection was down overnight

## 2024-03-31 NOTE — Plan of Care (Signed)

## 2024-03-31 NOTE — Progress Notes (Signed)
 Patient ID: John [name], male   DOB: Feb 16, 1961, 63 y.o.   MRN: [id number] He appears to be doing about the same.  Still some delirium or disorientation.  He is awake and alert and pleasant.  Frontal suture sites look okay.  He is getting EEG.  He moves all extremities well.  I read the therapy notes from yesterday.  Continue heparin  for recent PE  Still very difficult surgical decision making  Repeat head CT in a few days  Still considering decompressive surgery for the cervical spine at some point though I do not believe it is urgent

## 2024-03-31 NOTE — Progress Notes (Signed)
 PHARMACY - ANTICOAGULATION CONSULT NOTE  Pharmacy Consult for Heparin  Indication: pulmonary embolus  Allergies  Allergen Reactions   Flomax  [Tamsulosin  Hcl] Anxiety and Other (See Comments)    Jittery     Patient Measurements: Height: 6' 2 (188 cm) Weight: 118 kg (260 lb 2.3 oz) IBW/kg (Calculated) : 82.2 HEPARIN  DW (KG): 107.3  Vital Signs: Temp: 97.7 F (36.5 C) (07/15 2350) Temp Source: Oral (07/15 2350) BP: 127/96 (07/16 0329) Pulse Rate: 83 (07/16 0329)  Labs: Recent Labs    03/28/24 0742 03/29/24 0605 03/30/24 1015 03/30/24 1743 03/31/24 0100 03/31/24 0515  HGB 8.3* 9.0* 9.7*  --   --   --   HCT 26.0* 28.8* 31.0*  --   --   --   PLT 312 342 399  --   --   --   APTT  --   --   --  41* 138* 52*  HEPARINUNFRC  --   --   --  0.93* >1.10* 0.69  CREATININE 0.88 0.95 1.07  --   --   --     Estimated Creatinine Clearance: 96.4 mL/min (by C-G formula based on SCr of 1.07 mg/dL).   Medical History: Past Medical History:  Diagnosis Date   Bilateral pulmonary embolism (HCC) 03/16/2024   Hypertension    Parkinson disease (HCC) 03/16/2024   Parkinson's disease (HCC)    Stroke [facility])     Assessment: Patient recently started on apixaban  prior to this admission for acute PE, which is currently on hold due to bilateral subdural hematoma. Last apixaban  dose was on 7/14 at 0932. Will monitor aPTT levels to guide therapy.   Heparin  infusion initiated at 1500, level back 0.93 and aPTT 41 - not correlating. Per RN, drawn appropriately and no new bleeding noted.  7/16 AM update:  aPTT low at 52  Goal of Therapy:  Heparin  level 0.3 - 0.5 units/ml aPTT level 66 - 85 Monitor platelets by anticoagulation protocol: Yes   Plan:  Increase heparin  infusion to 1900 units/hr. No bolus with hematoma Will check anti-xa and aPTT level in 6-8 hours and daily while on heparin  until correlating Continue to monitor H&H platelets  Lynwood Mckusick, PharmD, BCPS Clinical  Pharmacist Phone: [phone]

## 2024-03-31 NOTE — Progress Notes (Signed)
 PHARMACY - ANTICOAGULATION CONSULT NOTE  Pharmacy Consult for Heparin  Indication: pulmonary embolus  Allergies  Allergen Reactions   Flomax  [Tamsulosin  Hcl] Anxiety and Other (See Comments)    Jittery     Patient Measurements: Height: 6' 2 (188 cm) Weight: 118 kg (260 lb 2.3 oz) IBW/kg (Calculated) : 82.2 HEPARIN  DW (KG): 107.3  Vital Signs: Temp: 97.4 F (36.3 C) (07/16 1149) Temp Source: Oral (07/16 1149) BP: 154/87 (07/16 1149) Pulse Rate: 91 (07/16 1149)  Labs: Recent Labs    03/29/24 0605 03/30/24 1015 03/30/24 1743 03/31/24 0100 03/31/24 0515 03/31/24 1431  HGB 9.0* 9.7*  --   --   --   --   HCT 28.8* 31.0*  --   --   --   --   PLT 342 399  --   --   --   --   APTT  --   --    < > 138* 52* 84*  HEPARINUNFRC  --   --    < > >1.10* 0.69 1.00*  CREATININE 0.95 1.07  --   --   --   --    < > = values in this interval not displayed.    Estimated Creatinine Clearance: 96.4 mL/min (by C-G formula based on SCr of 1.07 mg/dL).   Medical History: Past Medical History:  Diagnosis Date   Bilateral pulmonary embolism (HCC) 03/16/2024   Hypertension    Parkinson disease (HCC) 03/16/2024   Parkinson's disease (HCC)    Stroke [facility])     Assessment: Patient recently started on apixaban  prior to this admission for acute PE, which is currently on hold due to bilateral subdural hematoma. Last apixaban  dose was on 7/14 at 0932. Will monitor aPTT levels to guide therapy.   PTT 84 seconds   Goal of Therapy:  Heparin  level 0.3 - 0.5 units/ml aPTT level 66 - 85 Monitor platelets by anticoagulation protocol: Yes   Plan:  Decrease heparin  to 1850 units / hr.  Will check anti-xa and aPTT daily while on heparin  until correlating Continue to monitor H&H platelets  Thank you. Olam Monte, PharmD  03/31/24 3:38 PM

## 2024-03-31 NOTE — Progress Notes (Signed)
 Occupational Therapy Treatment Patient Details Name: John [name] MRN: [id number] DOB: 14-Jul-1961 Today's Date: 03/31/2024   History of present illness Pt is a 63 year old man admitted from [facility] with frequent falls and AMS. Head CT negative. +facial lacerations. MRI shows cervical stenosis and small bilat SDH; neurosurgery following. PMH: Parkinsons Disease, HTN, CVA, PE, anemia, recent B PEs, UTI.   OT comments  Pt asking to have a shower, unaware of continuous EEG. Moderate assistance for bed mobility. Participated in 2 grooming activities with set up. Pt continues to be disoriented to situation and his fall risk. Pt pleasant and cooperative with replacement of mitts at end of session. Patient will benefit from continued inpatient follow up therapy, <3 hours/day.      If plan is discharge home, recommend the following:  Two people to help with walking and/or transfers;A lot of help with bathing/dressing/bathroom;Assistance with cooking/housework;Direct supervision/assist for medications management;Direct supervision/assist for financial management;Assist for transportation;Help with stairs or ramp for entrance   Equipment Recommendations  Other (comment) (defer)    Recommendations for Other Services      Precautions / Restrictions Precautions Precautions: Fall Recall of Precautions/Restrictions: Impaired Precaution/Restrictions Comments: watch BP/ HR Restrictions Weight Bearing Restrictions Per Provider Order: No       Mobility Bed Mobility Overal bed mobility: Needs Assistance Bed Mobility: Supine to Sit, Sit to Supine     Supine to sit: Mod assist Sit to supine: Mod assist   General bed mobility comments: assist for trunk and LE management. cues for sequencing    Transfers                         Balance Overall balance assessment: Needs assistance   Sitting balance-Leahy Scale: Fair                                     ADL  either performed or assessed with clinical judgement   ADL Overall ADL's : Needs assistance/impaired     Grooming: Wash/dry hands;Oral care;Sitting;Supervision/safety               Lower Body Dressing: Total assistance;Bed level                      Extremity/Trunk Assessment              Vision       Perception     Praxis     Communication Communication Communication: No apparent difficulties   Cognition Arousal: Alert Behavior During Therapy: Flat affect, Restless Cognition: Cognition impaired   Orientation impairments: Place, Time, Situation Awareness: Intellectual awareness impaired, Online awareness impaired Memory impairment (select all impairments): Working [employer] fast streamer, Conservation officer, historic buildings Attention impairment (select first level of impairment): Sustained attention Executive functioning impairment (select all impairments): Reasoning, Problem solving, Sequencing, Initiation OT - Cognition Comments: pt repeatedly asking for a shower,                 Following commands: Intact Following commands impaired: Follows one step commands with increased time      Cueing   Cueing Techniques: Verbal cues  Exercises      Shoulder Instructions       General Comments      Pertinent Vitals/ Pain       Pain Assessment Pain Assessment: No/denies pain  Home Living  Prior Functioning/Environment              Frequency  Min 2X/week        Progress Toward Goals  OT Goals(current goals can now be found in the care plan section)  Progress towards OT goals: Progressing toward goals  Acute Rehab OT Goals OT Goal Formulation: With patient Time For Goal Achievement: 04/09/24 Potential to Achieve Goals: Fair  Plan      Co-evaluation                 AM-PAC OT 6 Clicks Daily Activity     Outcome Measure   Help from another person eating meals?: A Little Help  from another person taking care of personal grooming?: A Little Help from another person toileting, which includes using toliet, bedpan, or urinal?: Total Help from another person bathing (including washing, rinsing, drying)?: Total Help from another person to put on and taking off regular upper body clothing?: A Lot Help from another person to put on and taking off regular lower body clothing?: Total 6 Click Score: 11    End of Session    OT Visit Diagnosis: Unsteadiness on feet (R26.81);Other abnormalities of gait and mobility (R26.89);Other symptoms and signs involving cognitive function;Muscle weakness (generalized) (M62.81);History of falling (Z91.81)   Activity Tolerance Treatment limited secondary to medical complications (Comment) (continuous EEG)   Patient Left in bed;with call bell/phone within reach;with restraints reapplied;with nursing/sitter in room;with bed alarm set   Nurse Communication          Time: 8796-8769 OT Time Calculation (min): 27 min  Charges: OT General Charges $OT Visit: 1 Visit OT Treatments $Self Care/Home Management : 23-37 mins  John [name], John [name] Acute Rehabilitation Services Office: [phone]   John [name] 03/31/2024, 12:41 PM

## 2024-03-31 NOTE — Progress Notes (Signed)
 NEUROLOGY CONSULT FOLLOW UP NOTE   Date of service: March 31, 2024 Patient Name: John [name] MRN:  [id number] DOB:  1960/09/29  Interval Hx/subjective   Patient remains hemodynamically stable and afebrile overnight.  He has been seen by neurosurgery for subdural hematomas, and they wish to do no interventions for this at this time.  There is a consideration for surgery for his cervical spinal stenosis, but it is uncertain if benefits outweigh risks.  Awakens easily to voice, able to tell me his name and that he is in the hospital because he had some falls.  He is unable to tell me why he fell.  He states  well if I knew that I would not be here States the year as 43 and that he is 63 years old. Vitals   Vitals:   03/30/24 2000 03/30/24 2350 03/31/24 0329 03/31/24 0400  BP: 121/76 (!) 138/90 (!) 127/96 131/86  Pulse: (!) 106 (!) 104 83 86  Resp: 19 16 16 15   Temp:  97.7 F (36.5 C)    TempSrc:  Oral    SpO2: 97% 99% 99% 99%  Weight:      Height:         Body mass index is 33.4 kg/m.  Physical Exam   Constitutional: Chronically ill-appearing elderly patient in no acute distress Psych: Calm, cooperative with exam HENT: No OP obstruction. Multiple areas of contusions, bruising, 2 lacerations on the front of his face.   Respiratory: No distress, regular unlabored GI: Soft.  No distension. There is no tenderness.  Skin: WDI.   Neurologic Examination   Neuro: Mental Status: Patient is awake, responds to name and states he is in the hospital due to falls but is disoriented time and situation.  He is able to follow single and two-step commands.   Cranial Nerves: II: PERRL III,IV, VI: EOMI  V: Facial sensation is symmetric to light touch VII: Facial movement is symmetric resting and smiling VIII: Hearing is intact to voice XII: Tongue protrudes midline without atrophy or fasciculations.  Motor: Able to move all 4 extremities to command with good antigravity  strength Sensory: Sensation is symmetric to light touch in the arms and legs. Coordination:  Finger-to-nose intact bilaterally  Medications  Current Facility-Administered Medications:    acetaminophen  (TYLENOL ) tablet 650 mg, 650 mg, Oral, Q6H PRN, 650 mg at 03/27/24 0534 **OR** acetaminophen  (TYLENOL ) suppository 650 mg, 650 mg, Rectal, Q6H PRN, Claudene Reeves A, MD, 650 mg at 03/24/24 2329   albuterol  (PROVENTIL ) (2.5 MG/3ML) 0.083% nebulizer solution 2.5 mg, 2.5 mg, Nebulization, Q6H PRN, Claudene, Rondell A, MD   carbidopa -levodopa  (SINEMET  CR) 50-200 MG per tablet controlled release 1 tablet, 1 tablet, Oral, 6 times per day, Claudene Reeves A, MD, 1 tablet at 03/31/24 9358   Chlorhexidine  Gluconate Cloth 2 % PADS 6 each, 6 each, Topical, Daily, Claudene Reeves A, MD, 6 each at 03/30/24 1035   cyanocobalamin  (VITAMIN B12) injection 1,000 mcg, 1,000 mcg, Subcutaneous, Daily, 1,000 mcg at 03/30/24 0856 **FOLLOWED BY** [START ON 04/02/2024] cyanocobalamin  (VITAMIN B12) injection 1,000 mcg, 1,000 mcg, Subcutaneous, Q30 days, Elgergawy, Brayton RAMAN, MD   heparin  ADULT infusion 100 units/mL (25000 units/250mL), 1,900 Units/hr, Intravenous, Continuous, Clair Lynwood CROME, RPH, Last Rate: 19 mL/hr at 03/31/24 0643, 1,900 Units/hr at 03/31/24 0643   lactated ringers  infusion, , Intravenous, Continuous, Elgergawy, Brayton RAMAN, MD, Last Rate: 75 mL/hr at 03/31/24 0325, Infusion Verify at 03/31/24 0325   LORazepam  (ATIVAN ) injection 0.5 mg, 0.5 mg, Intravenous,  Q6H PRN, Claudene Reeves A, MD, 0.5 mg at 03/27/24 2216   ondansetron  (ZOFRAN ) tablet 4 mg, 4 mg, Oral, Q6H PRN **OR** ondansetron  (ZOFRAN ) injection 4 mg, 4 mg, Intravenous, Q6H PRN, Claudene, Rondell A, MD   senna-docusate (Senokot-S) tablet 1 tablet, 1 tablet, Oral, BID, Elgergawy, Brayton RAMAN, MD, 1 tablet at 03/30/24 2154   sodium chloride  flush (NS) 0.9 % injection 10-40 mL, 10-40 mL, Intracatheter, PRN, Elgergawy, Brayton RAMAN, MD   sodium chloride  flush (NS) 0.9 %  injection 3 mL, 3 mL, Intravenous, Q12H, Smith, Rondell A, MD, 3 mL at 03/30/24 2133   [COMPLETED] thiamine  (VITAMIN B1) 500 mg in sodium chloride  0.9 % 50 mL IVPB, 500 mg, Intravenous, Q8H, Last Rate: 110 mL/hr at 03/27/24 0530, 500 mg at 03/27/24 0530 **FOLLOWED BY** thiamine  (VITAMIN B1) 250 mg in sodium chloride  0.9 % 50 mL IVPB, 250 mg, Intravenous, Daily, Stopped at 03/30/24 1040 **FOLLOWED BY** [START ON 04/03/2024] thiamine  (VITAMIN B1) injection 100 mg, 100 mg, Intravenous, Daily, Bhagat, Srishti L, MD   vitamin E  capsule 400 Units, 400 Units, Oral, Daily, Elgergawy, Brayton RAMAN, MD, 400 Units at 03/30/24 9144   zinc  sulfate (50mg  elemental zinc ) capsule 220 mg, 220 mg, Oral, Daily, Elgergawy, Brayton RAMAN, MD, 220 mg at 03/30/24 0855  Labs and Diagnostic Imaging   CBC:  Recent Labs  Lab 03/29/24 0605 03/30/24 1015  WBC 5.5 9.8  HGB 9.0* 9.7*  HCT 28.8* 31.0*  MCV 86.0 86.4  PLT 342 399    Basic Metabolic Panel:  Lab Results  Component Value Date   NA 141 03/30/2024   K 4.2 03/30/2024   CO2 21 (L) 03/30/2024   GLUCOSE 89 03/30/2024   BUN 12 03/30/2024   CREATININE 1.07 03/30/2024   CALCIUM 9.0 03/30/2024   GFRNONAA >60 03/30/2024   GFRAA 88 03/13/2020   Lipid Panel:  Lab Results  Component Value Date   LDLCALC 73 12/10/2023   Alcohol Level     Component Value Date/Time   ETH <15 03/16/2024 2143   INR  Lab Results  Component Value Date   INR 1.3 (H) 03/23/2024   Folate - 10.6 Copper  - mild elevation 135 (ref 69 - 132) Zinc  - 57 B6 2.6 B3 B1 81 16.7 Vitamin E  10.9 yeah okay to put about long-term EEG presents with a subdural hematomas likely having subsequent seizure activity he does yeah yeah couple days you doing my push he did go to a few weeks ago he was okay for couple days PT OT he is having some accidents yeah I think he is also got some pain he is telling me that he is at the like 5 and he is seeing you trust plays outside this also yeah really doing but he  does not look like he was going to try thing just just yeah so MMA  B12- 206  CT Head without contrast 7/9 and 7/10 (Personally reviewed): No acute intracranial abnormality. Patchy chronic small vessel disease throughout the deep white matter.  CT C- spine 7/9: No acute bony abnormality. Cervical spondylosis with up to moderate to severe stenosis at C3/4   MRI brain under general anesthesia 7/14: Subdural hematomas overlying bilateral cerebral convexities, 6 mm in thickness on the right and 4 mm in thickness on the left, balanced mass effect upon underlying cerebral hemispheres without midline shift, mild diffuse dural thickening and enhancement overlying bilateral cerebral convexities along the falx, small cluster of chronic microhemorrhages within anterior left frontal lobe, mild chronic  small vessel ischemic changes  MRI of the cervical, thoracic and lumbar spine under general anesthesia 7/14: Cervical spondylosis with spinal canal stenosis at C3-C4 at the right aspect of the spinal canal with spinal cord flattening, small focus of signal abnormality with right within right aspect of spinal cord at this level, bilateral neuroforaminal narrowing, additional sites of foraminal stenosis greatest on the left at C6-C7, mild patchy edema within posterior elements, likely degenerative and related to facet arthropathy, suspected bridging osseous fusion across C5-C6 disc space, bulky bridging ventral osteophyte at C3-C4, mild grade 1 spondee U lithiasis at C3-C4 and C4-C5, no signal abnormality identified within cervical spinal cord, mild chronic anterior wedge deformities at T1 T7 T11 and T12 vertebrae, degenerative appearing edema and enhancement within T12 vertebral body, moderate neuroforaminal narrowing at T3-T4, disc degeneration greatest at T6-C7, T7-T8, T8-T9 and T9-T10, partially imaged sacral edema and enhancement, with lumbar spondylosis with no significant central canal stenosis, levocurvature of  the lumbar spine  rEEG: 7/9  IMPRESSION: This study is within normal limits. No seizures or epileptiform discharges were seen throughout the recording. A normal interictal EEG does not exclude the diagnosis of epilepsy.   cEEG:   Assessment   John [name] is a 63 y.o. male with a past medical history of parkinson's disease, HTN, recent PE now on eliquis  presenting after multiple falls and altered mental status.  Significant gradual decline consistent with progressive Parkinson's disease.  He was originally diagnosed with Parkinson's disease in 2022.  Unable to safely manage medications at home or maintain home in a safe living condition over the last few months.  Some possible cognitive decline as well although was still holding down his job at [employer] until disability recently approved within the past 2 months or so.  Multifactorial gait disorder due to PD, c-spine stenosis, back pain, orthostasis   On 7/14, patient underwent MRI of the brain and total spine under anesthesia.  Small bilateral subdurals were noted. Head CT showed no acute blood. Neurosurgery was consulted by hospitalist who recommended holding eliquis . Dr. Sherlon d/w Dr. Matthews by phone and stated that he felt comfortable with this since his PE were 2 weeks ago and no DVT identified on imaging.   Neurosurgery has evaluated patient does not wish to intervene for subdural hematomas.  They recommend starting heparin  to treat pulmonary embolus as it can be discontinued abruptly.  They are considering surgery for cervical spinal stenosis  Given patient's continued altered mental status and presence of subdural hematoma, subclinical seizure activity is a concern.  Will place patient on LTM EEG to assess for this.  Also, given hallucinations and illusions as well as fluctuating course of mental status, delirium is a likely component of patient's presentation.   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 - Critical cervical spinal stenosis - Potential postconcussive syndrome - Unlikely underlying seizure, though no clear description of seizures - Delirium in the setting of pain, hospitalization, head injury, medications   Recommendations  -Holding eliquis  for now see above, okay with starting heparin  per neurosurgery recommendations - No indication for ICU or strict BP goals given subdurals appear chronic, patient has remained <160 SBP past 24 hrs - Repeat head CT stat with any change in exam - TED stockings and abdominal binder to be used only before ambulation (remove when at rest) - Low normal B12 at 206, MMA normal but still recommend goal > 600 - Follow up  pending labs:  B3 supplement as need needed  - Please continue carbidopa /levodopa  at his home dose - LTM EEG - Delirium precautions -Would discontinue Haldol  and minimize use of Seroquel  as these can exacerbate parkinsonian symptoms -Continue home dose of Sinemet  - Neurology will continue to follow ______________________________________________________________________  Patient seen and examined by NP/APP with MD. MD to update note as needed.   Jorene Last, DNP, FNP-BC [facility] Pager: [phone]   Attending Neurohospitalist Addendum Patient seen and examined with APP/Resident. Agree with the history and physical as documented above. Agree with the plan as documented, which I helped formulate. I have edited the note above to reflect my full findings and recommendations. I have independently reviewed the chart, obtained history, review of systems and examined the patient.I have personally reviewed pertinent head/neck/spine imaging (CT/MRI). Please feel free to call with any questions.  If no epileptiform abnormalities are seen on tonight's EEG recording, OK to d/c LTM. Will f/u in the AM to discuss results and next steps with family; otherwise  will plan to be available prn for questions.   -- Elida Ross, MD [facility] [phone]  If 7pm- 7am, please page neurology on call as listed in AMION.

## 2024-03-31 NOTE — Progress Notes (Signed)
 LTM maint complete - no skin breakdown under: Fp1 Fp2 Fpz Serviced Fp1  [facility] monitored, Event button test confirmed by [facility].

## 2024-04-01 ENCOUNTER — Inpatient Hospital Stay (HOSPITAL_COMMUNITY)

## 2024-04-01 DIAGNOSIS — N401 Enlarged prostate with lower urinary tract symptoms: Secondary | ICD-10-CM | POA: Diagnosis not present

## 2024-04-01 DIAGNOSIS — G20C Parkinsonism, unspecified: Secondary | ICD-10-CM | POA: Diagnosis not present

## 2024-04-01 DIAGNOSIS — I6203 Nontraumatic chronic subdural hemorrhage: Secondary | ICD-10-CM | POA: Diagnosis not present

## 2024-04-01 DIAGNOSIS — F0280 Dementia in other diseases classified elsewhere without behavioral disturbance: Secondary | ICD-10-CM | POA: Diagnosis not present

## 2024-04-01 DIAGNOSIS — G9341 Metabolic encephalopathy: Secondary | ICD-10-CM | POA: Diagnosis not present

## 2024-04-01 DIAGNOSIS — G20B2 Parkinson's disease with dyskinesia, with fluctuations: Secondary | ICD-10-CM | POA: Diagnosis not present

## 2024-04-01 DIAGNOSIS — R410 Disorientation, unspecified: Secondary | ICD-10-CM | POA: Diagnosis not present

## 2024-04-01 DIAGNOSIS — R569 Unspecified convulsions: Secondary | ICD-10-CM | POA: Diagnosis not present

## 2024-04-01 DIAGNOSIS — R296 Repeated falls: Secondary | ICD-10-CM | POA: Diagnosis not present

## 2024-04-01 LAB — APTT: aPTT: 113 s — ABNORMAL HIGH (ref 24–36)

## 2024-04-01 LAB — PHOSPHORUS: Phosphorus: 2.1 mg/dL — ABNORMAL LOW (ref 2.5–4.6)

## 2024-04-01 MED ORDER — HEPARIN (PORCINE) 25000 UT/250ML-% IV SOLN
1500.0000 [IU]/h | INTRAVENOUS | Status: DC
  Administered 2024-04-02 (×2): 1600 [IU]/h via INTRAVENOUS
  Administered 2024-04-03: 1650 [IU]/h via INTRAVENOUS
  Administered 2024-04-04 – 2024-04-06 (×3): 1500 [IU]/h via INTRAVENOUS
  Filled 2024-04-01 (×6): qty 250

## 2024-04-01 NOTE — Progress Notes (Addendum)
 PHARMACY - ANTICOAGULATION CONSULT NOTE  Pharmacy Consult for Heparin  Indication: pulmonary embolus  Allergies  Allergen Reactions   Flomax  [Tamsulosin  Hcl] Anxiety and Other (See Comments)    Jittery     Patient Measurements: Height: 6' 2 (188 cm) Weight: 118 kg (260 lb 2.3 oz) IBW/kg (Calculated) : 82.2 HEPARIN  DW (KG): 107.3  Vital Signs: Temp: 98.3 F (36.8 C) (07/17 1500) Temp Source: Axillary (07/17 1500) BP: 135/88 (07/17 1500) Pulse Rate: 90 (07/17 1500)  Labs: Recent Labs    03/30/24 1015 03/30/24 1743 03/31/24 0100 03/31/24 0515 03/31/24 1431 04/01/24 1725  HGB 9.7*  --   --   --   --   --   HCT 31.0*  --   --   --   --   --   PLT 399  --   --   --   --   --   APTT  --    < > 138* 52* 84* 113*  HEPARINUNFRC  --    < > >1.10* 0.69 1.00*  --   CREATININE 1.07  --   --   --   --   --    < > = values in this interval not displayed.    Estimated Creatinine Clearance: 96.4 mL/min (by C-G formula based on SCr of 1.07 mg/dL).   Medical History: Past Medical History:  Diagnosis Date   Bilateral pulmonary embolism (HCC) 03/16/2024   Hypertension    Parkinson disease (HCC) 03/16/2024   Parkinson's disease (HCC)    Stroke [facility])     Assessment: Pt recently started on apixaban  prior to current admission for acute PE (03/10/24), which is on hold due to bilateral subdural hematoma. Last apixaban  dose was on 7/14 at 0932. Previously aPTT subtherapeutic on 1500 to 1900 units/hr but now uptrending despite decreases in rate. Will monitor aPTT levels to guide therapy.  aPTT 113 seconds , supratherapeutic. No issues with line and no bleeding per RN  Goal of Therapy:  Heparin  level 0.3 - 0.5 units/ml aPTT level 66 - 85 Monitor platelets by anticoagulation protocol: Yes   Plan:  Hold for 30 minutes per protocol Decrease heparin  infusion to 1600 units/hr Check heparin  level in 6 hours and daily while on heparin  Continue to monitor H&H and platelets   Randall Call, PharmD, BCPS 04/01/24 6:49 PM

## 2024-04-01 NOTE — Progress Notes (Signed)
 PROGRESS NOTE        PATIENT DETAILS Name: John [name] Age: 63 y.o. Sex: male Date of Birth: 10-18-1960 Admit Date: 03/24/2024 Admitting Physician [name], MD [name], [name], MD  Brief Summary: Patient is a 62 y.o.  male with history of Parkinson's disease, recent PE on anticoagulation-presented with recurrent falls-confusion.  Underwent multiple attempts for MRI imaging of brain/spine-but due to lack of patient cooperation-poor study due to significant motion artifact, subsequently underwent MRI under anesthesia-which showed SDH and severe C-spine stenosis.  Significant events: 7/9>> admit to [facility]  Significant studies: 7/8>> CXR: No PNA 7/8>> x-ray pelvis: No fracture.  7/8>> CT head: No acute intracranial abnormality 7/8>> CT maxillofacial: Redemonstrated fractures involving the right maxillary sinus/orbit-stable since 6/25.  No new fractures. 7/8>> CT C-spine: No fracture. 7/9>> x-ray left knee: No fracture. 7/9>> CT head: No acute intracranial abnormality. 7/9>> CT C-spine: No acute fracture. 7/10>> MRI C-spine/T-spine/L-spine: Motion artifact-limited study. 7/14>> MRI brain: SDH overlying bilateral cerebral convexities. 7/14>> MRI C-spine: Severe spinal canal stenosis 7/14>> MRI T-spine: No signal abnormality within the thoracic spinal cord. 7/14>> MRI L-spine: Acute fracture of the S3 sacral segment (demonstrated on prior CT 6/25).  Lumbar spondylosis. 7/14>> CT head: Stable subacute to chronic 6 mm right and chronic 4 mm left subdural hematoma. 7/15-7/16>> LTM EEG: No seizures. 7/16-7/17>> LTM EEG: No seizures.  Significant microbiology data: 7/9>> blood culture:  Procedures: None  Consults: Neurology Neurosurgery  Subjective: No issues overnight-less confused-able to answer more questions today.  Objective: Vitals: Blood pressure 131/79, pulse 86, temperature 98 F (36.7 C), temperature source Oral, resp. rate 18,  height 6' 2 (1.88 m), weight 118 kg, SpO2 92%.   Exam: Not in any distress Atraumatic/normocephalic Chest: Clear to auscultation CVS: S1-S2 regular Abdomen: Soft nontender nondistended Neurology: Moving all 4 extremities symmetrically.    Pertinent Labs/Radiology:    Latest Ref Rng & Units 03/30/2024   10:15 AM 03/29/2024    6:05 AM 03/28/2024    7:42 AM  CBC  WBC 4.0 - 10.5 K/uL 9.8  5.5  5.1   Hemoglobin 13.0 - 17.0 g/dL 9.7  9.0  8.3   Hematocrit 39.0 - 52.0 % 31.0  28.8  26.0   Platelets 150 - 400 K/uL 399  342  312     Lab Results  Component Value Date   NA 141 03/30/2024   K 4.2 03/30/2024   CL 107 03/30/2024   CO2 21 (L) 03/30/2024    Assessment/Plan: Frequent falls with right cheek hematoma, left forehead laceration (sutured 7/8 in the ED) right maxillary sinus and orbit fracture (present prior to this admit), S3 sacral segment fracture (present prior to this admit) Likely related to gait instability/autonomic dysfunction in the setting of Parkinson's disease-along with degenerative disc disease/C-spine stenosis. PT/OT eval SNF recommended Remove sutures around 7/18   SDH Traumatic Tolerating IV heparin  Neurosurgery following-repeat CT planned next several days.  Cervical spinal stenosis Neurosurgery following-would ongoing considerations for surgical intervention at some point-based on patient's clinical progress.  Acute metabolic encephalopathy Felt to be multifactorial-felt to be secondary to progressive Parkinson's disease related dementia-superimposed on potential postconcussive syndrome/SDH/borderline vitamin B12 deficiency/UTI Mental status improving with supportive care LTM EEG x 48 hours negative for seizures. Neurology following.  Complicated UTI Completed IV Rocephin  course-unfortunately urine culture was never sent.  Parkinson's disease Continue Sinemet   Recent pulmonary embolism On IV heparin  given SDH Once okay with neurosurgery-switch to  DOAC.  BPH with acute urinary retention Failed voiding trial-Foley catheter reinserted 7/13 Outpatient follow-up with urology-will DC with Foley catheter.  Borderline vitamin B12 deficiency Being supplemented.  B6 deficiency Continue supplementation.  Class 1 Obesity: Estimated body mass index is 33.4 kg/m as calculated from the following:   Height as of this encounter: 6' 2 (1.88 m).   Weight as of this encounter: 118 kg.   Code status:   Code Status: Full Code   DVT Prophylaxis: Place TED hose Start: 03/26/24 0929 IV heparin    Family Communication: Sister-[name] updated 7/17   Disposition Plan: Status is: Inpatient Remains inpatient appropriate because: Severity of illness   Planned Discharge Destination:Skilled nursing facility   Diet: Diet Order             Diet regular Room service appropriate? Yes; Fluid consistency: Thin  Diet effective now                     Antimicrobial agents: Anti-infectives (From admission, onward)    Start     Dose/Rate Route Frequency Ordered Stop   03/24/24 2000  cefTRIAXone  (ROCEPHIN ) 1 g in sodium chloride  0.9 % 100 mL IVPB        1 g 200 mL/hr over 30 Minutes Intravenous Every 24 hours 03/24/24 1150 03/30/24 2226        MEDICATIONS: Scheduled Meds:  carbidopa -levodopa   1 tablet Oral 6 times per day   Chlorhexidine  Gluconate Cloth  6 each Topical Daily   [START ON 04/02/2024] cyanocobalamin   1,000 mcg Subcutaneous Q30 days   pyridOXINE   50 mg Oral Daily   senna-docusate  1 tablet Oral BID   sodium chloride  flush  3 mL Intravenous Q12H   [START ON 04/03/2024] thiamine  (VITAMIN B1) injection  100 mg Intravenous Daily   vitamin E   400 Units Oral Daily   zinc  sulfate (50mg  elemental zinc )  220 mg Oral Daily   Continuous Infusions:  heparin  1,800 Units/hr (04/01/24 1006)   thiamine  (VITAMIN B1) injection 250 mg (04/01/24 1015)   PRN Meds:.acetaminophen  **OR** acetaminophen , albuterol , LORazepam ,  ondansetron  **OR** ondansetron  (ZOFRAN ) IV, sodium chloride  flush   I have personally reviewed following labs and imaging studies  LABORATORY DATA: CBC: Recent Labs  Lab 03/26/24 0628 03/27/24 0624 03/28/24 0742 03/29/24 0605 03/30/24 1015  WBC 5.5 5.5 5.1 5.5 9.8  HGB 8.0* 8.1* 8.3* 9.0* 9.7*  HCT 25.6* 25.0* 26.0* 28.8* 31.0*  MCV 87.4 85.6 86.1 86.0 86.4  PLT 277 298 312 342 399    Basic Metabolic Panel: Recent Labs  Lab 03/26/24 0628 03/27/24 0624 03/28/24 0742 03/29/24 0605 03/30/24 1015 04/01/24 0502  NA 140 139 136 137 141  --   K 3.3* 3.7 3.7 3.9 4.2  --   CL 110 111 107 106 107  --   CO2 20* 20* 20* 20* 21*  --   GLUCOSE 92 89 92 83 89  --   BUN 10 7* <5* <5* 12  --   CREATININE 0.93 0.93 0.88 0.95 1.07  --   CALCIUM 8.1* 8.2* 8.3* 8.6* 9.0  --   MG 1.8 1.6* 2.1 1.8 2.0  --   PHOS 2.3* 2.4* 2.5 2.6 1.5* 2.1*    GFR: Estimated Creatinine Clearance: 96.4 mL/min (by C-G formula based on SCr of 1.07 mg/dL).  Liver Function Tests: No results for input(s): AST, ALT, ALKPHOS, BILITOT, PROT, ALBUMIN in the last  168 hours. No results for input(s): LIPASE, AMYLASE in the last 168 hours. No results for input(s): AMMONIA in the last 168 hours.  Coagulation Profile: No results for input(s): INR, PROTIME in the last 168 hours.  Cardiac Enzymes: No results for input(s): CKTOTAL, CKMB, CKMBINDEX, TROPONINI in the last 168 hours.  BNP (last 3 results) No results for input(s): PROBNP in the last 8760 hours.  Lipid Profile: No results for input(s): CHOL, HDL, LDLCALC, TRIG, CHOLHDL, LDLDIRECT in the last 72 hours.  Thyroid Function Tests: No results for input(s): TSH, T4TOTAL, FREET4, T3FREE, THYROIDAB in the last 72 hours.  Anemia Panel: No results for input(s): VITAMINB12, FOLATE, FERRITIN, TIBC, IRON, RETICCTPCT in the last 72 hours.  Urine analysis:    Component Value Date/Time    COLORURINE AMBER (A) 03/23/2024 1845   APPEARANCEUR HAZY (A) 03/23/2024 1845   LABSPEC 1.016 03/23/2024 1845   LABSPEC 1.010 02/10/2024 1448   PHURINE 6.0 03/23/2024 1845   GLUCOSEU NEGATIVE 03/23/2024 1845   HGBUR SMALL (A) 03/23/2024 1845   BILIRUBINUR NEGATIVE 03/23/2024 1845   BILIRUBINUR negative 02/10/2024 1448   KETONESUR 5 (A) 03/23/2024 1845   PROTEINUR NEGATIVE 03/23/2024 1845   NITRITE NEGATIVE 03/23/2024 1845   LEUKOCYTESUR NEGATIVE 03/23/2024 1845    Sepsis Labs: Lactic Acid, Venous    Component Value Date/Time   LATICACIDVEN 1.2 03/16/2024 2143    MICROBIOLOGY: Recent Results (from the past 240 hours)  Urine Culture     Status: Abnormal   Collection Time: 03/23/24  6:45 PM   Specimen: Urine, Clean Catch  Result Value Ref Range Status   Specimen Description URINE, CLEAN CATCH  Final   Special Requests NONE  Final   Culture (A)  Final    >=100,000 COLONIES/mL AEROCOCCUS SPECIES Standardized susceptibility testing for this organism is not available. Performed at [facility], [address] [place], [place] [address]    Report Status 03/26/2024 FINAL  Final  Blood culture (routine x 2)     Status: None   Collection Time: 03/24/24 11:44 AM   Specimen: BLOOD LEFT ARM  Result Value Ref Range Status   Specimen Description BLOOD LEFT ARM  Final   Special Requests   Final    BOTTLES DRAWN AEROBIC AND ANAEROBIC Blood Culture results may not be optimal due to an inadequate volume of blood received in culture bottles   Culture   Final    NO GROWTH 5 DAYS Performed at [facility], [address], [place], [place] [address]    Report Status 03/29/2024 FINAL  Final  Blood culture (routine x 2)     Status: None   Collection Time: 03/24/24 11:50 AM   Specimen: BLOOD RIGHT ARM  Result Value Ref Range Status   Specimen Description BLOOD RIGHT ARM  Final   Special Requests   Final    BOTTLES DRAWN AEROBIC AND ANAEROBIC Blood Culture results may not be  optimal due to an inadequate volume of blood received in culture bottles   Culture   Final    NO GROWTH 5 DAYS Performed at [facility], [address] [place], [place] [address]    Report Status 03/29/2024 FINAL  Final    RADIOLOGY STUDIES/RESULTS: Overnight EEG with video Result Date: 03/31/2024 [name], MD     04/01/2024 10:41 AM Patient Name: John [name] MRN: [id number] Epilepsy Attending: Arlin KIDD Shelton Referring Physician/Provider: everitt Clint Abbey Earle FORBES, NP Duration: 03/30/2024 9043 to 03/31/2024 9043  Patient history:  63 y.o. male with a past medical history of parkinson's disease, HTN, recent PE now on eliquis  presenting after multiple falls and altered mental status. EEG to evaluate for seizure  Level of alertness: Awake, asleep  AEDs during EEG study: None Technical aspects: This EEG study was done with scalp electrodes positioned according to the 10-20 International system of electrode placement. Electrical activity was reviewed with band pass filter of 1-70Hz , sensitivity of 7 uV/mm, display speed of 60mm/sec with a 60Hz  notched filter applied as appropriate. EEG data were recorded continuously and digitally stored.  Video monitoring was available and reviewed as appropriate.  Description: The posterior dominant rhythm consists of 8 Hz activity of moderate voltage (25-35 uV) seen predominantly in posterior head regions, symmetric and reactive to eye opening and eye closing. Sleep was characterized by sleep spindles (12-14hz ), maximum frontocentral region. EEG showed intermittent generalized 3 to paresthesia. ABNORMALITY - Intermittent slow, generalized IMPRESSION: This study is suggestive of mild diffuse encephalopathy.  No seizures or epileptiform discharges were seen throughout the recording.  Arlin MALVA Krebs     LOS: 8 days   Donalda Applebaum, MD  [facility]    To contact the attending provider between 7A-7P or the covering provider during after hours  7P-7A, please log into the web site [email or web address] and access using universal [place] password for that web site. If you do not have the password, please call the hospital operator.  04/01/2024, 11:08 AM

## 2024-04-01 NOTE — Progress Notes (Signed)
 LTM maint complete - no skin breakdown under: FP2,O2,C3

## 2024-04-01 NOTE — Procedures (Signed)
 Patient Name: John [name]  MRN: [id number]  Epilepsy Attending: Arlin MALVA Krebs  Referring Physician/Provider: everitt Clint Abbey Earle FORBES, NP  Duration: 03/31/2024 9043 to 04/01/2024 9043   Patient history:  63 y.o. male with a past medical history of parkinson's disease, HTN, recent PE now on eliquis  presenting after multiple falls and altered mental status. EEG to evaluate for seizure    Level of alertness: Awake, asleep   AEDs during EEG study: None   Technical aspects: This EEG study was done with scalp electrodes positioned according to the 10-20 International system of electrode placement. Electrical activity was reviewed with band pass filter of 1-70Hz , sensitivity of 7 uV/mm, display speed of 77mm/sec with a 60Hz  notched filter applied as appropriate. EEG data were recorded continuously and digitally stored.  Video monitoring was available and reviewed as appropriate.   Description: The posterior dominant rhythm consists of 8 Hz activity of moderate voltage (25-35 uV) seen predominantly in posterior head regions, symmetric and reactive to eye opening and eye closing. Sleep was characterized by sleep spindles (12-14hz ), maximum frontocentral region. EEG showed intermittent generalized 3 to paresthesia.   ABNORMALITY - Intermittent slow, generalized   IMPRESSION: This study is suggestive of mild diffuse encephalopathy.  No seizures or epileptiform discharges were seen throughout the recording.     John [name]

## 2024-04-01 NOTE — Progress Notes (Signed)
 NEUROLOGY CONSULT FOLLOW UP NOTE   Date of service: April 01, 2024 Patient Name: John [name] MRN:  [id number] DOB:  Oct 08, 1960  Interval Hx/subjective   Patient remains hemodynamically stable and afebrile overnight.  He has been seen by neurosurgery for subdural hematomas, and they wish to do no interventions for this at this time.  There is a consideration for surgery for his cervical spinal stenosis, but it is uncertain if benefits outweigh risks.  Awakens easily to voice, able to tell me his name and that he is in the hospital again today.  States the year as 55 and that he is 63 years old. He is able to correct himself today to tell me that he should be older than that  Vitals   Vitals:   03/31/24 1149 03/31/24 1558 03/31/24 2300 04/01/24 0914  BP: (!) 154/87 127/87 136/73 131/79  Pulse: 91 (!) 196 94 86  Resp: 17 20 20 18   Temp: (!) 97.4 F (36.3 C) 98.7 F (37.1 C)  98 F (36.7 C)  TempSrc: Oral Axillary  Oral  SpO2: 100% (!) 87% 98% 92%  Weight:      Height:         Body mass index is 33.4 kg/m.  Physical Exam   Constitutional: Chronically ill-appearing elderly patient in no acute distress Psych: Calm, cooperative with exam HENT: No OP obstruction. Multiple areas of contusions, bruising, 2 lacerations on the front of his face.   Respiratory: No distress, regular unlabored GI: Soft.  No distension. There is no tenderness.  Skin: WDI.   Neurologic Examination   Neuro: Mental Status: Patient is awake, responds to name and states he is in the hospital due to falls but is disoriented time and situation.  He is able to follow single and two-step commands.   Cranial Nerves: II: PERRL III,IV, VI: EOMI  V: Facial sensation is symmetric to light touch VII: Facial movement is symmetric resting and smiling VIII: Hearing is intact to voice XII: Tongue protrudes midline without atrophy or fasciculations.  Motor: Able to move all 4 extremities to command with good  antigravity strength Sensory: Sensation is symmetric to light touch in the arms and legs. Coordination:  Finger-to-nose intact bilaterally  Medications  Current Facility-Administered Medications:    acetaminophen  (TYLENOL ) tablet 650 mg, 650 mg, Oral, Q6H PRN, 650 mg at 03/27/24 0534 **OR** acetaminophen  (TYLENOL ) suppository 650 mg, 650 mg, Rectal, Q6H PRN, Claudene Reeves A, MD, 650 mg at 03/24/24 2329   albuterol  (PROVENTIL ) (2.5 MG/3ML) 0.083% nebulizer solution 2.5 mg, 2.5 mg, Nebulization, Q6H PRN, Claudene, Rondell A, MD   carbidopa -levodopa  (SINEMET  CR) 50-200 MG per tablet controlled release 1 tablet, 1 tablet, Oral, 6 times per day, Claudene Reeves A, MD, 1 tablet at 04/01/24 1009   Chlorhexidine  Gluconate Cloth 2 % PADS 6 each, 6 each, Topical, Daily, Claudene Reeves A, MD, 6 each at 04/01/24 1010   [COMPLETED] cyanocobalamin  (VITAMIN B12) injection 1,000 mcg, 1,000 mcg, Subcutaneous, Daily, 1,000 mcg at 04/01/24 1010 **FOLLOWED BY** [START ON 04/02/2024] cyanocobalamin  (VITAMIN B12) injection 1,000 mcg, 1,000 mcg, Subcutaneous, Q30 days, Elgergawy, Brayton RAMAN, MD   heparin  ADULT infusion 100 units/mL (25000 units/250mL), 1,800 Units/hr, Intravenous, Continuous, Ghimire, Shanker M, MD, Last Rate: 18 mL/hr at 04/01/24 1006, 1,800 Units/hr at 04/01/24 1006   LORazepam  (ATIVAN ) injection 0.5 mg, 0.5 mg, Intravenous, Q6H PRN, Claudene, Rondell A, MD, 0.5 mg at 03/31/24 1626   ondansetron  (ZOFRAN ) tablet 4 mg, 4 mg, Oral, Q6H PRN **OR** ondansetron  (  ZOFRAN ) injection 4 mg, 4 mg, Intravenous, Q6H PRN, Claudene, Rondell A, MD   pyridOXINE  (VITAMIN B6) tablet 50 mg, 50 mg, Oral, Daily, Ghimire, Donalda HERO, MD, 50 mg at 04/01/24 1008   senna-docusate (Senokot-S) tablet 1 tablet, 1 tablet, Oral, BID, Elgergawy, Brayton RAMAN, MD, 1 tablet at 04/01/24 1009   sodium chloride  flush (NS) 0.9 % injection 10-40 mL, 10-40 mL, Intracatheter, PRN, Elgergawy, Brayton RAMAN, MD   sodium chloride  flush (NS) 0.9 % injection 3 mL, 3  mL, Intravenous, Q12H, Smith, Rondell A, MD, 3 mL at 04/01/24 1010   [COMPLETED] thiamine  (VITAMIN B1) 500 mg in sodium chloride  0.9 % 50 mL IVPB, 500 mg, Intravenous, Q8H, Last Rate: 110 mL/hr at 03/27/24 0530, 500 mg at 03/27/24 0530 **FOLLOWED BY** thiamine  (VITAMIN B1) 250 mg in sodium chloride  0.9 % 50 mL IVPB, 250 mg, Intravenous, Daily, Last Rate: 105 mL/hr at 04/01/24 1015, 250 mg at 04/01/24 1015 **FOLLOWED BY** [START ON 04/03/2024] thiamine  (VITAMIN B1) injection 100 mg, 100 mg, Intravenous, Daily, Bhagat, Srishti L, MD   vitamin E  capsule 400 Units, 400 Units, Oral, Daily, Elgergawy, Brayton RAMAN, MD, 400 Units at 04/01/24 1009   zinc  sulfate (50mg  elemental zinc ) capsule 220 mg, 220 mg, Oral, Daily, Elgergawy, Brayton RAMAN, MD, 220 mg at 03/31/24 0912  Labs and Diagnostic Imaging   CBC:  Recent Labs  Lab 03/29/24 0605 03/30/24 1015  WBC 5.5 9.8  HGB 9.0* 9.7*  HCT 28.8* 31.0*  MCV 86.0 86.4  PLT 342 399    Basic Metabolic Panel:  Lab Results  Component Value Date   NA 141 03/30/2024   K 4.2 03/30/2024   CO2 21 (L) 03/30/2024   GLUCOSE 89 03/30/2024   BUN 12 03/30/2024   CREATININE 1.07 03/30/2024   CALCIUM 9.0 03/30/2024   GFRNONAA >60 03/30/2024   GFRAA 88 03/13/2020   Lipid Panel:  Lab Results  Component Value Date   LDLCALC 73 12/10/2023   Alcohol Level     Component Value Date/Time   ETH <15 03/16/2024 2143   INR  Lab Results  Component Value Date   INR 1.3 (H) 03/23/2024   Folate - 10.6 Copper  - mild elevation 135 (ref 69 - 132) Zinc  - 57 B6 2.6 B3 B1 81 16.7 Vitamin E  10.9 yeah okay to put about long-term EEG presents with a subdural hematomas likely having subsequent seizure activity he does yeah yeah couple days you doing my push he did go to a few weeks ago he was okay for couple days PT OT he is having some accidents yeah I think he is also got some pain he is telling me that he is at the like 5 and he is seeing you trust plays outside this also yeah  really doing but he does not look like he was going to try thing just just yeah so MMA  B12- 206  CT Head without contrast 7/9 and 7/10 (Personally reviewed): No acute intracranial abnormality. Patchy chronic small vessel disease throughout the deep white matter.  CT C- spine 7/9: No acute bony abnormality. Cervical spondylosis with up to moderate to severe stenosis at C3/4   MRI brain under general anesthesia 7/14: Subdural hematomas overlying bilateral cerebral convexities, 6 mm in thickness on the right and 4 mm in thickness on the left, balanced mass effect upon underlying cerebral hemispheres without midline shift, mild diffuse dural thickening and enhancement overlying bilateral cerebral convexities along the falx, small cluster of chronic microhemorrhages within anterior left  frontal lobe, mild chronic small vessel ischemic changes  MRI of the cervical, thoracic and lumbar spine under general anesthesia 7/14: Cervical spondylosis with spinal canal stenosis at C3-C4 at the right aspect of the spinal canal with spinal cord flattening, small focus of signal abnormality with right within right aspect of spinal cord at this level, bilateral neuroforaminal narrowing, additional sites of foraminal stenosis greatest on the left at C6-C7, mild patchy edema within posterior elements, likely degenerative and related to facet arthropathy, suspected bridging osseous fusion across C5-C6 disc space, bulky bridging ventral osteophyte at C3-C4, mild grade 1 spondee U lithiasis at C3-C4 and C4-C5, no signal abnormality identified within cervical spinal cord, mild chronic anterior wedge deformities at T1 T7 T11 and T12 vertebrae, degenerative appearing edema and enhancement within T12 vertebral body, moderate neuroforaminal narrowing at T3-T4, disc degeneration greatest at T6-C7, T7-T8, T8-T9 and T9-T10, partially imaged sacral edema and enhancement, with lumbar spondylosis with no significant central canal  stenosis, levocurvature of the lumbar spine  rEEG: 7/9  IMPRESSION: This study is within normal limits. No seizures or epileptiform discharges were seen throughout the recording. A normal interictal EEG does not exclude the diagnosis of epilepsy.   cEEG: 03/30/2024 0956 to 03/31/2024 0900  This study is suggestive of mild diffuse encephalopathy.  No seizures or epileptiform discharges were seen throughout the recording.     Assessment   John [name] is a 63 y.o. male with a past medical history of parkinson's disease, HTN, recent PE now on eliquis  presenting after multiple falls and altered mental status.  Significant gradual decline consistent with progressive Parkinson's disease.  He was originally diagnosed with Parkinson's disease in 2022.  Unable to safely manage medications at home or maintain home in a safe living condition over the last few months.  Some possible cognitive decline as well although was still holding down his job at [employer] until disability recently approved within the past 2 months or so.  Multifactorial gait disorder due to PD, c-spine stenosis, back pain, orthostasis   On 7/14, patient underwent MRI of the brain and total spine under anesthesia.  Small bilateral subdurals were noted. Head CT showed no acute blood. Neurosurgery was consulted by hospitalist who recommended holding eliquis . Dr. Sherlon d/w Dr. Matthews by phone and stated that he felt comfortable with this since his PE were 2 weeks ago and no DVT identified on imaging.   Neurosurgery has evaluated patient does not wish to intervene for subdural hematomas.  They recommend starting heparin  to treat pulmonary embolus as it can be discontinued abruptly.  They are considering surgery for cervical spinal stenosis  Given patient's continued altered mental status and presence of subdural hematoma, subclinical seizure activity is a concern.  Will place patient on LTM EEG to assess for this.  Also, given  hallucinations and illusions as well as fluctuating course of mental status, delirium is a likely component of patient's presentation.   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 - Critical cervical spinal stenosis - Potential postconcussive syndrome - Unlikely underlying seizure, though no clear description of seizures - Delirium in the setting of pain, hospitalization, head injury, medications   Recommendations  -Holding eliquis  for now see above, okay with starting heparin  per neurosurgery recommendations - No indication for ICU or strict BP goals given subdurals appear chronic, patient has remained <160 SBP past 24 hrs - Repeat head CT stat with any change in exam - TED  stockings and abdominal binder to be used only before ambulation (remove when at rest) - Low normal B12 at 206, MMA normal but still recommend goal > 600 - Follow up pending labs:  B3 supplement as need needed  - Please continue carbidopa /levodopa  at his home dose - LTM EEG - Delirium precautions -Would discontinue Haldol  and minimize use of Seroquel  as these can exacerbate parkinsonian symptoms -Continue home dose of Sinemet  - Neurology will continue to follow *** ______________________________________________________________________  Patient seen and examined by NP/APP with MD. MD to update note as needed.   Jorene Last, DNP, FNP-BC [facility] Pager: [phone]

## 2024-04-01 NOTE — Progress Notes (Signed)
 PHARMACY - ANTICOAGULATION CONSULT NOTE  Pharmacy Consult for Heparin  Indication: pulmonary embolus  Allergies  Allergen Reactions   Flomax  [Tamsulosin  Hcl] Anxiety and Other (See Comments)    Jittery     Patient Measurements: Height: 6' 2 (188 cm) Weight: 118 kg (260 lb 2.3 oz) IBW/kg (Calculated) : 82.2 HEPARIN  DW (KG): 107.3  Vital Signs: BP: 136/73 (07/16 2300) Pulse Rate: 94 (07/16 2300)  Labs: Recent Labs    03/30/24 1015 03/30/24 1743 03/31/24 0100 03/31/24 0515 03/31/24 1431  HGB 9.7*  --   --   --   --   HCT 31.0*  --   --   --   --   PLT 399  --   --   --   --   APTT  --    < > 138* 52* 84*  HEPARINUNFRC  --    < > >1.10* 0.69 1.00*  CREATININE 1.07  --   --   --   --    < > = values in this interval not displayed.    Estimated Creatinine Clearance: 96.4 mL/min (by C-G formula based on SCr of 1.07 mg/dL).   Medical History: Past Medical History:  Diagnosis Date   Bilateral pulmonary embolism (HCC) 03/16/2024   Hypertension    Parkinson disease (HCC) 03/16/2024   Parkinson's disease (HCC)    Stroke [facility])     Assessment: Pt recently started on apixaban  prior to current admission for acute PE (03/10/24), which is on hold due to bilateral subdural hematoma. Last apixaban  dose was on 7/14 at 0932. aPTT WNL but trending up since 7/15 and now at upper limit of goal. Will monitor aPTT levels to guide therapy.  PTT 84 seconds   Goal of Therapy:  Heparin  level 0.3 - 0.5 units/ml aPTT level 66 - 85 Monitor platelets by anticoagulation protocol: Yes   Plan:  Decrease heparin  infusion at 1800 units/hr Check heparin  level in 8 hours and daily while on heparin  Continue to monitor H&H and platelets   Prentice DOROTHA Favors, PharmD PGY1 Health-System Pharmacy Administration and Leadership Resident [facility]  04/01/2024 8:04 AM

## 2024-04-01 NOTE — Plan of Care (Signed)

## 2024-04-01 NOTE — Progress Notes (Signed)
 SLP Cancellation Note  Patient Details Name: LES LONGMORE MRN: [id number] DOB: 07/05/1961   Cancelled treatment:       Reason Eval/Treat Not Completed: Patient at procedure or test/unavailable   Pat Cesar Alf,M.S.,CCC-SLP 04/01/2024, 10:25 AM

## 2024-04-01 NOTE — TOC Progression Note (Signed)
 Transition of Care [facility]) - Progression Note    Patient Details  Name: TECUMSEH YEAGLEY MRN: [id number] Date of Birth: 1960/10/06  [facility]) CM/SW Contact  [name], LCSW Phone Number: 04/01/2024, 10:41 AM  Clinical Narrative:    CSW continuing to follow.   Expected Discharge Plan: Skilled Nursing Facility Barriers to Discharge: Continued Medical Work up, English as a second language teacher, SNF Pending bed offer  Expected Discharge Plan and Services In-house Referral: Clinical Social Work   Post Acute Care Choice: Skilled Nursing Facility Living arrangements for the past 2 months: Single Family Home, Skilled Nursing Facility                                       Social Determinants of Health (SDOH) Interventions SDOH Screenings   Food Insecurity: Patient Unable To Answer (03/25/2024)  Housing: Unknown (03/25/2024)  Transportation Needs: Patient Unable To Answer (03/25/2024)  Utilities: Patient Unable To Answer (03/25/2024)  Depression (PHQ2-9): Low Risk  (12/10/2023)  Tobacco Use: Medium Risk (03/29/2024)    Readmission Risk Interventions     No data to display

## 2024-04-02 ENCOUNTER — Inpatient Hospital Stay (HOSPITAL_COMMUNITY)

## 2024-04-02 ENCOUNTER — Encounter (HOSPITAL_COMMUNITY)

## 2024-04-02 DIAGNOSIS — G20B2 Parkinson's disease with dyskinesia, with fluctuations: Secondary | ICD-10-CM | POA: Diagnosis not present

## 2024-04-02 DIAGNOSIS — R296 Repeated falls: Secondary | ICD-10-CM | POA: Diagnosis not present

## 2024-04-02 DIAGNOSIS — G9341 Metabolic encephalopathy: Secondary | ICD-10-CM | POA: Diagnosis not present

## 2024-04-02 DIAGNOSIS — R569 Unspecified convulsions: Secondary | ICD-10-CM | POA: Diagnosis not present

## 2024-04-02 DIAGNOSIS — N401 Enlarged prostate with lower urinary tract symptoms: Secondary | ICD-10-CM | POA: Diagnosis not present

## 2024-04-02 LAB — CBC
HCT: 29.9 % — ABNORMAL LOW (ref 39.0–52.0)
Hemoglobin: 9.1 g/dL — ABNORMAL LOW (ref 13.0–17.0)
MCH: 27 pg (ref 26.0–34.0)
MCHC: 30.4 g/dL (ref 30.0–36.0)
MCV: 88.7 fL (ref 80.0–100.0)
Platelets: 381 K/uL (ref 150–400)
RBC: 3.37 MIL/uL — ABNORMAL LOW (ref 4.22–5.81)
RDW: 14.8 % (ref 11.5–15.5)
WBC: 6.2 K/uL (ref 4.0–10.5)
nRBC: 0 % (ref 0.0–0.2)

## 2024-04-02 LAB — APTT: aPTT: 84 s — ABNORMAL HIGH (ref 24–36)

## 2024-04-02 LAB — GLUCOSE, CAPILLARY: Glucose-Capillary: 122 mg/dL — ABNORMAL HIGH (ref 70–99)

## 2024-04-02 NOTE — Progress Notes (Signed)
 LTM EEG discontinued - no skin breakdown at [facility].

## 2024-04-02 NOTE — Procedures (Addendum)
 Patient Name: John [name]  MRN: [id number]  Epilepsy Attending: Arlin MALVA Krebs  Referring Physician/Provider: everitt Clint Abbey Earle FORBES, NP  Duration: 04/01/2024 0956 to 04/02/2024 1117   Patient history:  63 y.o. male with a past medical history of parkinson's disease, HTN, recent PE now on eliquis  presenting after multiple falls and altered mental status. EEG to evaluate for seizure    Level of alertness: Awake, asleep   AEDs during EEG study: None   Technical aspects: This EEG study was done with scalp electrodes positioned according to the 10-20 International system of electrode placement. Electrical activity was reviewed with band pass filter of 1-70Hz , sensitivity of 7 uV/mm, display speed of 65mm/sec with a 60Hz  notched filter applied as appropriate. EEG data were recorded continuously and digitally stored.  Video monitoring was available and reviewed as appropriate.   Description: The posterior dominant rhythm consists of 8 Hz activity of moderate voltage (25-35 uV) seen predominantly in posterior head regions, symmetric and reactive to eye opening and eye closing. Sleep was characterized by sleep spindles (12-14hz ), maximum frontocentral region. EEG showed intermittent generalized 3 to paresthesia.   ABNORMALITY - Intermittent slow, generalized   IMPRESSION: This study is suggestive of mild diffuse encephalopathy.  No seizures or epileptiform discharges were seen throughout the recording.     Waverly Chavarria O [name]

## 2024-04-02 NOTE — Progress Notes (Signed)
 PROGRESS NOTE        PATIENT DETAILS Name: John [name] Age: 63 y.o. Sex: male Date of Birth: 1961-09-13 Admit Date: 03/24/2024 Admitting Physician John DELENA Sharps, MD [name], [name], MD  Brief Summary: Patient is a 63 y.o.  male with history of Parkinson's disease, recent PE on anticoagulation-presented with recurrent falls-confusion.  Underwent multiple attempts for MRI imaging of brain/spine-but due to lack of patient cooperation-poor study due to significant motion artifact, subsequently underwent MRI under anesthesia-which showed SDH and severe C-spine stenosis.  Significant events: 7/9>> admit to [facility]  Significant studies: 7/8>> CXR: No PNA 7/8>> x-ray pelvis: No fracture.  7/8>> CT head: No acute intracranial abnormality 7/8>> CT maxillofacial: Redemonstrated fractures involving the right maxillary sinus/orbit-stable since 6/25.  No new fractures. 7/8>> CT C-spine: No fracture. 7/9>> x-ray left knee: No fracture. 7/9>> CT head: No acute intracranial abnormality. 7/9>> CT C-spine: No acute fracture. 7/10>> MRI C-spine/T-spine/L-spine: Motion artifact-limited study. 7/14>> MRI brain: SDH overlying bilateral cerebral convexities. 7/14>> MRI C-spine: Severe spinal canal stenosis 7/14>> MRI T-spine: No signal abnormality within the thoracic spinal cord. 7/14>> MRI L-spine: Acute fracture of the S3 sacral segment (demonstrated on prior CT 6/25).  Lumbar spondylosis. 7/14>> CT head: Stable subacute to chronic 6 mm right and chronic 4 mm left subdural hematoma. 7/15-7/16>> LTM EEG: No seizures. 7/16-7/17>> LTM EEG: No seizures.  Significant microbiology data: 7/9>> blood culture:  Procedures: None  Consults: Neurology Neurosurgery  Subjective: No major issues overnight-waxing/waning confusion continues.  Moving all 4 extremities.  No family at bedside.   Objective: Vitals: Blood pressure (!) 144/85, pulse 91, temperature 97.6 F (36.4  C), temperature source Oral, resp. rate 18, height 6' 2 (1.88 m), weight 118 kg, SpO2 97%.   Exam: Not in distress Atraumatic/normocephalic CVS: S1-S2 regular Chest: Clear to auscultation Abdomen: Soft nontender nondistended Extremities: No edema Neurology: Difficult exam but moving all 4 extremities-easily able to lift both legs off the bed.   Pertinent Labs/Radiology:    Latest Ref Rng & Units 04/02/2024    2:00 AM 03/30/2024   10:15 AM 03/29/2024    6:05 AM  CBC  WBC 4.0 - 10.5 K/uL 6.2  9.8  5.5   Hemoglobin 13.0 - 17.0 g/dL 9.1  9.7  9.0   Hematocrit 39.0 - 52.0 % 29.9  31.0  28.8   Platelets 150 - 400 K/uL 381  399  342     Lab Results  Component Value Date   NA 141 03/30/2024   K 4.2 03/30/2024   CL 107 03/30/2024   CO2 21 (L) 03/30/2024    Assessment/Plan: Frequent falls with right cheek hematoma, left forehead laceration (sutured 7/8 in the ED) right maxillary sinus and orbit fracture (present prior to this admit), S3 sacral segment fracture (present prior to this admit) Likely related to gait instability/autonomic dysfunction in the setting of Parkinson's disease-along with degenerative disc disease/C-spine stenosis. PT/OT eval SNF recommended Remove sutures around 7/18   SDH Traumatic Tolerating IV heparin  Neurosurgery following-repeat CT planned next several days.  Cervical spinal stenosis Neurosurgery following-would ongoing considerations for surgical intervention at some point-based on patient's clinical progress.  Acute metabolic encephalopathy Felt to be multifactorial-felt to be secondary to progressive Parkinson's disease related dementia-superimposed on potential postconcussive syndrome/SDH/borderline vitamin B12 deficiency/UTI Mental status somewhat improved but continues to wax and wane. LTM EEG x  48 hours negative for seizures. Neurology following.  Complicated UTI Completed IV Rocephin  course-unfortunately urine culture was never  sent.  Parkinson's disease Continue Sinemet   Recent pulmonary embolism On IV heparin  given SDH Once okay with neurosurgery-switch to DOAC.  BPH with acute urinary retention Failed voiding trial-Foley catheter reinserted 7/13 Outpatient follow-up with urology-will DC with Foley catheter.  Borderline vitamin B12 deficiency Being supplemented.  B6 deficiency Continue supplementation.  Class 1 Obesity: Estimated body mass index is 33.4 kg/m as calculated from the following:   Height as of this encounter: 6' 2 (1.88 m).   Weight as of this encounter: 118 kg.   Code status:   Code Status: Full Code   DVT Prophylaxis: Place TED hose Start: 03/26/24 0929 IV heparin    Family Communication: Sister-John [name]-[phone] updated 7/17   Disposition Plan: Status is: Inpatient Remains inpatient appropriate because: Severity of illness   Planned Discharge Destination:Skilled nursing facility   Diet: Diet Order             Diet regular Room service appropriate? Yes; Fluid consistency: Thin  Diet effective now                     Antimicrobial agents: Anti-infectives (From admission, onward)    Start     Dose/Rate Route Frequency Ordered Stop   03/24/24 2000  cefTRIAXone  (ROCEPHIN ) 1 g in sodium chloride  0.9 % 100 mL IVPB        1 g 200 mL/hr over 30 Minutes Intravenous Every 24 hours 03/24/24 1150 03/30/24 2226        MEDICATIONS: Scheduled Meds:  carbidopa -levodopa   1 tablet Oral 6 times per day   Chlorhexidine  Gluconate Cloth  6 each Topical Daily   cyanocobalamin   1,000 mcg Subcutaneous Q30 days   pyridOXINE   50 mg Oral Daily   senna-docusate  1 tablet Oral BID   sodium chloride  flush  3 mL Intravenous Q12H   [START ON 04/03/2024] thiamine  (VITAMIN B1) injection  100 mg Intravenous Daily   vitamin E   400 Units Oral Daily   zinc  sulfate (50mg  elemental zinc )  220 mg Oral Daily   Continuous Infusions:  heparin  1,600 Units/hr (04/02/24 0801)   PRN  Meds:.acetaminophen  **OR** acetaminophen , albuterol , LORazepam , ondansetron  **OR** ondansetron  (ZOFRAN ) IV, sodium chloride  flush   I have personally reviewed following labs and imaging studies  LABORATORY DATA: CBC: Recent Labs  Lab 03/27/24 0624 03/28/24 0742 03/29/24 0605 03/30/24 1015 04/02/24 0200  WBC 5.5 5.1 5.5 9.8 6.2  HGB 8.1* 8.3* 9.0* 9.7* 9.1*  HCT 25.0* 26.0* 28.8* 31.0* 29.9*  MCV 85.6 86.1 86.0 86.4 88.7  PLT 298 312 342 399 381    Basic Metabolic Panel: Recent Labs  Lab 03/27/24 0624 03/28/24 0742 03/29/24 0605 03/30/24 1015 04/01/24 0502  NA 139 136 137 141  --   K 3.7 3.7 3.9 4.2  --   CL 111 107 106 107  --   CO2 20* 20* 20* 21*  --   GLUCOSE 89 92 83 89  --   BUN 7* <5* <5* 12  --   CREATININE 0.93 0.88 0.95 1.07  --   CALCIUM 8.2* 8.3* 8.6* 9.0  --   MG 1.6* 2.1 1.8 2.0  --   PHOS 2.4* 2.5 2.6 1.5* 2.1*    GFR: Estimated Creatinine Clearance: 96.4 mL/min (by C-G formula based on SCr of 1.07 mg/dL).  Liver Function Tests: No results for input(s): AST, ALT, ALKPHOS, BILITOT, PROT, ALBUMIN in the last  168 hours. No results for input(s): LIPASE, AMYLASE in the last 168 hours. No results for input(s): AMMONIA in the last 168 hours.  Coagulation Profile: No results for input(s): INR, PROTIME in the last 168 hours.  Cardiac Enzymes: No results for input(s): CKTOTAL, CKMB, CKMBINDEX, TROPONINI in the last 168 hours.  BNP (last 3 results) No results for input(s): PROBNP in the last 8760 hours.  Lipid Profile: No results for input(s): CHOL, HDL, LDLCALC, TRIG, CHOLHDL, LDLDIRECT in the last 72 hours.  Thyroid Function Tests: No results for input(s): TSH, T4TOTAL, FREET4, T3FREE, THYROIDAB in the last 72 hours.  Anemia Panel: No results for input(s): VITAMINB12, FOLATE, FERRITIN, TIBC, IRON, RETICCTPCT in the last 72 hours.  Urine analysis:    Component Value Date/Time    COLORURINE AMBER (A) 03/23/2024 1845   APPEARANCEUR HAZY (A) 03/23/2024 1845   LABSPEC 1.016 03/23/2024 1845   LABSPEC 1.010 02/10/2024 1448   PHURINE 6.0 03/23/2024 1845   GLUCOSEU NEGATIVE 03/23/2024 1845   HGBUR SMALL (A) 03/23/2024 1845   BILIRUBINUR NEGATIVE 03/23/2024 1845   BILIRUBINUR negative 02/10/2024 1448   KETONESUR 5 (A) 03/23/2024 1845   PROTEINUR NEGATIVE 03/23/2024 1845   NITRITE NEGATIVE 03/23/2024 1845   LEUKOCYTESUR NEGATIVE 03/23/2024 1845    Sepsis Labs: Lactic Acid, Venous    Component Value Date/Time   LATICACIDVEN 1.2 03/16/2024 2143    MICROBIOLOGY: Recent Results (from the past 240 hours)  Urine Culture     Status: Abnormal   Collection Time: 03/23/24  6:45 PM   Specimen: Urine, Clean Catch  Result Value Ref Range Status   Specimen Description URINE, CLEAN CATCH  Final   Special Requests NONE  Final   Culture (A)  Final    >=100,000 COLONIES/mL AEROCOCCUS SPECIES Standardized susceptibility testing for this organism is not available. Performed at [facility], [address] [place], [place] [address]    Report Status 03/26/2024 FINAL  Final  Blood culture (routine x 2)     Status: None   Collection Time: 03/24/24 11:44 AM   Specimen: BLOOD LEFT ARM  Result Value Ref Range Status   Specimen Description BLOOD LEFT ARM  Final   Special Requests   Final    BOTTLES DRAWN AEROBIC AND ANAEROBIC Blood Culture results may not be optimal due to an inadequate volume of blood received in culture bottles   Culture   Final    NO GROWTH 5 DAYS Performed at [facility], [address], [place], [place] [address]    Report Status 03/29/2024 FINAL  Final  Blood culture (routine x 2)     Status: None   Collection Time: 03/24/24 11:50 AM   Specimen: BLOOD RIGHT ARM  Result Value Ref Range Status   Specimen Description BLOOD RIGHT ARM  Final   Special Requests   Final    BOTTLES DRAWN AEROBIC AND ANAEROBIC Blood Culture results may not be  optimal due to an inadequate volume of blood received in culture bottles   Culture   Final    NO GROWTH 5 DAYS Performed at [facility], [address] [place], [place] [address]    Report Status 03/29/2024 FINAL  Final    RADIOLOGY STUDIES/RESULTS: No results found.    LOS: 9 days   Donalda Applebaum, MD  [facility]    To contact the attending provider between 7A-7P or the covering provider during after hours 7P-7A, please log into the web site [email or web address] and access  using universal [place] password for that web site. If you do not have the password, please call the hospital operator.  04/02/2024, 11:05 AM

## 2024-04-02 NOTE — Progress Notes (Signed)
 Physical Therapy Treatment Patient Details Name: John [name] MRN: [id number] DOB: 1961-01-15 Today's Date: 04/02/2024   History of Present Illness Pt is a 63 year old man admitted from [facility] with frequent falls and AMS. Head CT negative. +facial lacerations. MRI shows cervical stenosis and small bilat SDH; neurosurgery following. PMH: Parkinsons Disease, HTN, CVA, PE, anemia, recent B PEs, UTI.    PT Comments  Pt received in supine and agreeable to session. Pt demonstrates decreased awareness requiring increased cues and assist throughout session. Pt demonstrates posterior bias in sitting and standing and is able to intermittently correct with cues. Pt able to complete short gait distance, but demonstrates short steps with increased instability requiring mod A +2. Pt requires increased cues for sequencing during all mobility tasks. Pt continues to benefit from PT services to progress toward functional mobility goals.    If plan is discharge home, recommend the following: A lot of help with bathing/dressing/bathroom;Assist for transportation;Help with stairs or ramp for entrance;Supervision due to cognitive status;A lot of help with walking and/or transfers   Can travel by private vehicle     No  Equipment Recommendations  None recommended by PT    Recommendations for Other Services       Precautions / Restrictions Precautions Precautions: Fall Recall of Precautions/Restrictions: Impaired Precaution/Restrictions Comments: watch BP/ HR Restrictions Weight Bearing Restrictions Per Provider Order: No     Mobility  Bed Mobility Overal bed mobility: Needs Assistance Bed Mobility: Rolling, Sidelying to Sit, Sit to Supine Rolling: Max assist Sidelying to sit: +2 for physical assistance, Max assist   Sit to supine: +2 for physical assistance, Mod assist   General bed mobility comments: cues for technique and to self assist using bed rail, min assist for LEs over EOB, max  assist to raise trunk, mod assist to guide trunk and min assist for LEs back into bed    Transfers Overall transfer level: Needs assistance Equipment used: Rolling walker (2 wheels) Transfers: Sit to/from Stand Sit to Stand: Min assist, +2 safety/equipment, From elevated surface, Mod assist           General transfer comment: increased assist for first stand from elevated bed for anterior weight shift and to rise, min +2 safety from recliner x2, cues for hand placement each stand    Ambulation/Gait Ambulation/Gait assistance: Mod assist, +2 safety/equipment Gait Distance (Feet): 5 Feet Assistive device: Rolling walker (2 wheels) Gait Pattern/deviations: Step-through pattern, Decreased stride length, Trunk flexed, Narrow base of support, Shuffle       General Gait Details: posterior bias requiring assist for balance. cues for anterior weight shift and upright posture due to lateral leans. Max cues for increased step length and foot clearance, but little improvement   Stairs             Wheelchair Mobility     Tilt Bed    Modified Rankin (Stroke Patients Only)       Balance Overall balance assessment: Needs assistance Sitting-balance support: No upper extremity supported, Feet supported Sitting balance-Leahy Scale: Fair Sitting balance - Comments: initially poor with posterior bias, improved to fair   Standing balance support: Bilateral upper extremity supported, During functional activity, Reliant on assistive device for balance Standing balance-Leahy Scale: Poor Standing balance comment: Reliant on RW and up to +2 mod assist due to posterior lean  Communication Communication Communication: Impaired Factors Affecting Communication: Reduced clarity of speech  Cognition Arousal: Alert Behavior During Therapy: Flat affect   PT - Cognitive impairments: Orientation, Awareness, Memory, Attention, Safety/Judgement, Problem  solving                       PT - Cognition Comments: Poor safety awareness and awareness of deficits with pt stating he could go home tomorrow and ambulate with his quad cane Following commands: Impaired Following commands impaired: Follows one step commands with increased time    Cueing Cueing Techniques: Verbal cues, Tactile cues  Exercises      General Comments        Pertinent Vitals/Pain Pain Assessment Pain Assessment: Faces Faces Pain Scale: Hurts little more Pain Location: back, right knee Pain Descriptors / Indicators: Discomfort Pain Intervention(s): Monitored during session, Repositioned     PT Goals (current goals can now be found in the care plan section) Acute Rehab PT Goals Patient Stated Goal: stop falling so much PT Goal Formulation: With patient Time For Goal Achievement: 04/09/24 Progress towards PT goals: Progressing toward goals    Frequency    Min 2X/week      PT Plan      Co-evaluation PT/OT/SLP Co-Evaluation/Treatment: Yes Reason for Co-Treatment: Necessary to address cognition/behavior during functional activity;For patient/therapist safety PT goals addressed during session: Mobility/safety with mobility;Balance;Proper use of DME OT goals addressed during session: ADL's and self-care      AM-PAC PT 6 Clicks Mobility   Outcome Measure  Help needed turning from your back to your side while in a flat bed without using bedrails?: A Lot Help needed moving from lying on your back to sitting on the side of a flat bed without using bedrails?: A Lot Help needed moving to and from a bed to a chair (including a wheelchair)?: A Lot Help needed standing up from a chair using your arms (e.g., wheelchair or bedside chair)?: A Lot Help needed to walk in hospital room?: Total (>20ft) Help needed climbing 3-5 steps with a railing? : Total 6 Click Score: 10    End of Session Equipment Utilized During Treatment: Gait belt Activity  Tolerance: Patient tolerated treatment well Patient left: in bed;with call bell/phone within reach;with bed alarm set Nurse Communication: Mobility status PT Visit Diagnosis: Unsteadiness on feet (R26.81);Other abnormalities of gait and mobility (R26.89);Other symptoms and signs involving the nervous system (R29.898)     Time: 8893-8847 PT Time Calculation (min) (ACUTE ONLY): 46 min  Charges:    $Gait Training: 8-22 mins $Therapeutic Activity: 8-22 mins PT General Charges $$ ACUTE PT VISIT: 1 Visit                     Darryle George, PTA Acute Rehabilitation Services Secure Chat Preferred  [phone]    Darryle George 04/02/2024, 2:35 PM

## 2024-04-02 NOTE — Progress Notes (Signed)
 Occupational Therapy Treatment Patient Details Name: John [name] MRN: [id number] DOB: [birth year] Today's Date: 04/02/2024   History of present illness Pt is a 63 year old man admitted from [facility] with frequent falls and AMS. Head CT negative. +facial lacerations. MRI shows cervical stenosis and small bilat SDH; neurosurgery following. PMH: Parkinsons Disease, HTN, CVA, PE, anemia, recent B PEs, UTI.   OT comments  Pt with increased weakness with prolonged hospitalization. Needing +2 min to mod assist for all mobility with posterior lean of which he is seemingly unaware nor able to correct. Able to ambulate with close chair follow. Pt unable to state if he experiences symptoms of orthostasis or as his HR increased to 151. He is dependent in LB dressing require max assist for front opening gown. Able to complete oral care with min assist. Pt believes he is safe to go home with his quad cane. Patient will benefit from continued inpatient follow up therapy, <3 hours/day       If plan is discharge home, recommend the following:  Two people to help with walking and/or transfers;A lot of help with bathing/dressing/bathroom;Assistance with cooking/housework;Direct supervision/assist for medications management;Direct supervision/assist for financial management;Assist for transportation;Help with stairs or ramp for entrance   Equipment Recommendations  Wheelchair (measurements OT);Wheelchair cushion (measurements OT)    Recommendations for Other Services      Precautions / Restrictions Precautions Precautions: Fall Recall of Precautions/Restrictions: Impaired Precaution/Restrictions Comments: watch BP/ HR Restrictions Weight Bearing Restrictions Per Provider Order: No       Mobility Bed Mobility Overal bed mobility: Needs Assistance Bed Mobility: Rolling, Sidelying to Sit, Sit to Supine Rolling: Max assist Sidelying to sit: +2 for physical assistance, Max assist   Sit to  supine: +2 for physical assistance, Mod assist   General bed mobility comments: cues for technique and to self assist using bed rail, min assist for LEs over EOB, max assist to raise trunk, mod assist to guide trunk and min assist for LEs back into bed    Transfers Overall transfer level: Needs assistance Equipment used: Rolling walker (2 wheels) Transfers: Sit to/from Stand Sit to Stand: Min assist, +2 safety/equipment, From elevated surface, Mod assist           General transfer comment: increased assist for first stand from elevated bed for anterior weight shift and to rise, min +2 safety from recliner, cues for hand placement each stand     Balance Overall balance assessment: Needs assistance   Sitting balance-Leahy Scale: Fair Sitting balance - Comments: initially poor with posterior bias, improved to fair Postural control: Posterior lean Standing balance support: Bilateral upper extremity supported, During functional activity, Reliant on assistive device for balance Standing balance-Leahy Scale: Poor Standing balance comment: Reliant on RW and up to +2 mod assist due to posterior lean                           ADL either performed or assessed with clinical judgement   ADL Overall ADL's : Needs assistance/impaired     Grooming: Oral care;Minimal assistance;Sitting;Brushing hair;Total assistance           Upper Body Dressing : Maximal assistance;Sitting   Lower Body Dressing: Total assistance;Bed level               Functional mobility during ADLs: +2 for physical assistance;Moderate assistance;Rolling walker (2 wheels)      Extremity/Trunk Assessment  Vision       Perception     Praxis     Communication Communication Communication: Impaired Factors Affecting Communication: Reduced clarity of speech   Cognition Arousal: Alert Behavior During Therapy: Flat affect Cognition: Cognition impaired   Orientation  impairments: Place, Time, Situation Awareness: Intellectual awareness impaired, Online awareness impaired Memory impairment (select all impairments): Working [employer] fast streamer, Conservation officer, historic buildings Attention impairment (select first level of impairment): Sustained attention Executive functioning impairment (select all impairments): Reasoning, Problem solving, Sequencing, Initiation OT - Cognition Comments: pt repeatedly asking for a shower,                 Following commands: Impaired Following commands impaired: Follows one step commands with increased time      Cueing   Cueing Techniques: Verbal cues, Tactile cues  Exercises      Shoulder Instructions       General Comments      Pertinent Vitals/ Pain       Pain Assessment Pain Assessment: Faces Faces Pain Scale: Hurts little more Pain Location: back, right knee Pain Descriptors / Indicators: Discomfort Pain Intervention(s): Monitored during session, Repositioned  Home Living                                          Prior Functioning/Environment              Frequency  Min 2X/week        Progress Toward Goals  OT Goals(current goals can now be found in the care plan section)  Progress towards OT goals: Progressing toward goals  Acute Rehab OT Goals OT Goal Formulation: With patient Time For Goal Achievement: 04/09/24 Potential to Achieve Goals: Fair  Plan      Co-evaluation    PT/OT/SLP Co-Evaluation/Treatment: Yes Reason for Co-Treatment: Necessary to address cognition/behavior during functional activity;For patient/therapist safety   OT goals addressed during session: ADL's and self-care      AM-PAC OT 6 Clicks Daily Activity     Outcome Measure   Help from another person eating meals?: A Little Help from another person taking care of personal grooming?: A Little Help from another person toileting, which includes using toliet, bedpan, or urinal?: Total Help from  another person bathing (including washing, rinsing, drying)?: Total Help from another person to put on and taking off regular upper body clothing?: A Lot Help from another person to put on and taking off regular lower body clothing?: Total 6 Click Score: 11    End of Session Equipment Utilized During Treatment: Gait belt;Rolling walker (2 wheels)  OT Visit Diagnosis: Unsteadiness on feet (R26.81);Other abnormalities of gait and mobility (R26.89);Other symptoms and signs involving cognitive function;Muscle weakness (generalized) (M62.81);History of falling (Z91.81)   Activity Tolerance Patient tolerated treatment well   Patient Left in bed;with call bell/phone within reach;with bed alarm set   Nurse Communication Mobility status        Time: 8895-8846 OT Time Calculation (min): 49 min  Charges: OT General Charges $OT Visit: 1 Visit OT Treatments $Self Care/Home Management : 8-22 mins  Mliss HERO, OTR/L Acute Rehabilitation Services Office: [phone]   Kennth Mliss Helling 04/02/2024, 12:08 PM

## 2024-04-02 NOTE — Progress Notes (Addendum)
 PHARMACY - ANTICOAGULATION CONSULT NOTE  Pharmacy Consult for Heparin  Indication: pulmonary embolus  Allergies  Allergen Reactions   Flomax  [Tamsulosin  Hcl] Anxiety and Other (See Comments)    Jittery     Patient Measurements: Height: 6' 2 (188 cm) Weight: 118 kg (260 lb 2.3 oz) IBW/kg (Calculated) : 82.2 HEPARIN  DW (KG): 107.3  Vital Signs: Temp: 98.4 F (36.9 C) (07/18 0000) Temp Source: Oral (07/18 0000) BP: 118/87 (07/18 0000) Pulse Rate: 91 (07/17 1954)  Labs: Recent Labs    03/30/24 1015 03/30/24 1743 03/31/24 0100 03/31/24 0515 03/31/24 1431 04/01/24 1725 04/02/24 0200  HGB 9.7*  --   --   --   --   --  9.1*  HCT 31.0*  --   --   --   --   --  29.9*  PLT 399  --   --   --   --   --  381  APTT  --    < > 138* 52* 84* 113* 84*  HEPARINUNFRC  --    < > >1.10* 0.69 1.00*  --   --   CREATININE 1.07  --   --   --   --   --   --    < > = values in this interval not displayed.    Estimated Creatinine Clearance: 96.4 mL/min (by C-G formula based on SCr of 1.07 mg/dL).   Assessment: Pt recently started on apixaban  prior to current admission for acute PE (03/10/24), which is on hold due to bilateral subdural hematoma. Last apixaban  dose was on 7/14 at 0932. Previously aPTT subtherapeutic on 1500 to 1900 units/hr but now uptrending despite decreases in rate. Will monitor aPTT levels to guide therapy.  aPTT therapeutic (84 sec) on infusion at 1600 units/hr. No bleeding per RN.  Goal of Therapy:  Heparin  level 0.3 - 0.5 units/ml aPTT level 66 - 85 sec Monitor platelets by anticoagulation protocol: Yes   Plan:  Continue heparin  infusion at 1600 units/hr Daily heparin  level, PTT, CBC  Thank you. Olam Monte, PharmD 04/02/24 3:06 AM

## 2024-04-02 NOTE — Plan of Care (Signed)
 Pt has rested quietly throughout the night with no distress noted. Alert and oriented to self only. On room air. SR on the monitor. Foley cath intact to BSD amber urine. Medicated for anxiety once with relief noted. Pt has continuous EEG. Pt mitts on. Pt continuously trying to pull leads off, unwrap Ivor pull at IV's. No complaints voiced.     Problem: Clinical Measurements: Goal: Respiratory complications will improve Outcome: Progressing Goal: Cardiovascular complication will be avoided Outcome: Progressing   Problem: Activity: Goal: Risk for activity intolerance will decrease Outcome: Progressing   Problem: Coping: Goal: Level of anxiety will decrease Outcome: Progressing   Problem: Pain Managment: Goal: General experience of comfort will improve and/or be controlled Outcome: Progressing

## 2024-04-03 DIAGNOSIS — G9341 Metabolic encephalopathy: Secondary | ICD-10-CM | POA: Diagnosis not present

## 2024-04-03 DIAGNOSIS — S0191XA Laceration without foreign body of unspecified part of head, initial encounter: Secondary | ICD-10-CM | POA: Diagnosis not present

## 2024-04-03 DIAGNOSIS — R296 Repeated falls: Secondary | ICD-10-CM | POA: Diagnosis not present

## 2024-04-03 DIAGNOSIS — N3000 Acute cystitis without hematuria: Secondary | ICD-10-CM | POA: Diagnosis not present

## 2024-04-03 LAB — APTT
aPTT: 66 s — ABNORMAL HIGH (ref 24–36)
aPTT: 86 s — ABNORMAL HIGH (ref 24–36)

## 2024-04-03 LAB — HEPARIN LEVEL (UNFRACTIONATED)
Heparin Unfractionated: 0.47 [IU]/mL (ref 0.30–0.70)
Heparin Unfractionated: 0.47 [IU]/mL (ref 0.30–0.70)

## 2024-04-03 LAB — CBC
HCT: 32.4 % — ABNORMAL LOW (ref 39.0–52.0)
Hemoglobin: 10 g/dL — ABNORMAL LOW (ref 13.0–17.0)
MCH: 27 pg (ref 26.0–34.0)
MCHC: 30.9 g/dL (ref 30.0–36.0)
MCV: 87.6 fL (ref 80.0–100.0)
Platelets: 366 K/uL (ref 150–400)
RBC: 3.7 MIL/uL — ABNORMAL LOW (ref 4.22–5.81)
RDW: 15.2 % (ref 11.5–15.5)
WBC: 6.3 K/uL (ref 4.0–10.5)
nRBC: 0 % (ref 0.0–0.2)

## 2024-04-03 MED ORDER — ENSURE PLUS HIGH PROTEIN PO LIQD
237.0000 mL | Freq: Two times a day (BID) | ORAL | Status: DC
  Administered 2024-04-03 – 2024-04-09 (×7): 237 mL via ORAL

## 2024-04-03 MED ORDER — THIAMINE MONONITRATE 100 MG PO TABS
100.0000 mg | ORAL_TABLET | Freq: Every day | ORAL | Status: DC
  Administered 2024-04-03 – 2024-04-16 (×14): 100 mg via ORAL
  Filled 2024-04-03 (×14): qty 1

## 2024-04-03 NOTE — Progress Notes (Signed)
 PHARMACY - ANTICOAGULATION CONSULT NOTE  Pharmacy Consult for Heparin  Indication: pulmonary embolus  Allergies  Allergen Reactions   Flomax  [Tamsulosin  Hcl] Anxiety and Other (See Comments)    Jittery     Patient Measurements: Height: 6' 2 (188 cm) Weight: 118 kg (260 lb 2.3 oz) IBW/kg (Calculated) : 82.2 HEPARIN  DW (KG): 107.3  Vital Signs: Temp: 98.1 F (36.7 C) (07/19 1933) Temp Source: Oral (07/19 1933) BP: 108/93 (07/19 1933) Pulse Rate: 95 (07/19 1933)  Labs: Recent Labs    04/02/24 0200 04/03/24 0604 04/03/24 1953  HGB 9.1* 10.0*  --   HCT 29.9* 32.4*  --   PLT 381 366  --   APTT 84* 66* 86*  HEPARINUNFRC  --  0.47 0.47    Estimated Creatinine Clearance: 96.4 mL/min (by C-G formula based on SCr of 1.07 mg/dL).   Assessment: Pt recently started on apixaban  prior to current admission for acute PE (03/10/24), which is on hold due to bilateral subdural hematoma. Last apixaban  dose was on 7/14 at 0932. Previously aPTT subtherapeutic on 1500 to 1900 units/hr but now uptrending despite decreases in rate. Will monitor aPTT levels to guide therapy.   7/19 2nd: aPTT 86 seconds supratherapeutic on 1650 units/hr. HL unchanged at 0.47 from 0604 HL, therefore suspect that aPTT and HL are not yet correlating. No signs of bleeding or issues with heparin  infusion per RN.   Goal of Therapy:  Heparin  level 0.3 - 0.5 units/ml aPTT level 66 - 85 sec Monitor platelets by anticoagulation protocol: Yes   Plan:  Decrease heparin  infusion 1600 units/hr Recheck aPTT and HL with morning labs Daily heparin  level, CBC F/u surgery plan  Dionicia Canavan, PharmD PGY1 Acute Care Pharmacy Resident [facility]  04/03/2024 9:02 PM

## 2024-04-03 NOTE — Plan of Care (Signed)
  Problem: Health Behavior/Discharge Planning: Goal: Ability to manage health-related needs will improve Outcome: Progressing   Problem: Activity: Goal: Risk for activity intolerance will decrease Outcome: Progressing   Problem: Safety: Goal: Ability to remain free from injury will improve Outcome: Progressing   Problem: Skin Integrity: Goal: Risk for impaired skin integrity will decrease Outcome: Progressing   

## 2024-04-03 NOTE — Progress Notes (Signed)
 PHARMACY - ANTICOAGULATION CONSULT NOTE  Pharmacy Consult for Heparin  Indication: pulmonary embolus  Allergies  Allergen Reactions   Flomax  [Tamsulosin  Hcl] Anxiety and Other (See Comments)    Jittery     Patient Measurements: Height: 6' 2 (188 cm) Weight: 118 kg (260 lb 2.3 oz) IBW/kg (Calculated) : 82.2 HEPARIN  DW (KG): 107.3  Vital Signs: Temp: 98.2 F (36.8 C) (07/19 0755) Temp Source: Oral (07/19 0755) BP: 135/83 (07/19 0755) Pulse Rate: 100 (07/19 0755)  Labs: Recent Labs    03/31/24 1431 04/01/24 1725 04/02/24 0200 04/03/24 0604  HGB  --   --  9.1* 10.0*  HCT  --   --  29.9* 32.4*  PLT  --   --  381 366  APTT 84* 113* 84* 66*  HEPARINUNFRC 1.00*  --   --  0.47    Estimated Creatinine Clearance: 96.4 mL/min (by C-G formula based on SCr of 1.07 mg/dL).   Assessment: Pt recently started on apixaban  prior to current admission for acute PE (03/10/24), which is on hold due to bilateral subdural hematoma. Last apixaban  dose was on 7/14 at 0932. Previously aPTT subtherapeutic on 1500 to 1900 units/hr but now uptrending despite decreases in rate. Will monitor aPTT levels to guide therapy.  PTT and heparin  within range and correlating. We will increase heparin  slightly and stop PTT monitoring  Goal of Therapy:  Monitor platelets by anticoagulation protocol: Yes   Plan:  Increase heparin  infusion 1650 units/hr Daily heparin  level, CBC F/u surgery plan  Sergio Batch, PharmD, BCIDP, AAHIVP, CPP Infectious Disease Pharmacist 04/03/2024 9:11 AM

## 2024-04-03 NOTE — Plan of Care (Signed)
 Patient awake, alert, confused. Reorientation provided to patient.  Continues to try to get out of bed.  Mats on floor.  Tele sitter in room. Patient denies pain.  Door remains open for frequent observation of patient.  Scheduled medications given to patient. Problem: Safety: Goal: Non-violent Restraint(s) Outcome: Progressing   Problem: Education: Goal: Knowledge of General Education information will improve Description: Including pain rating scale, medication(s)/side effects and non-pharmacologic comfort measures Outcome: Progressing   Problem: Health Behavior/Discharge Planning: Goal: Ability to manage health-related needs will improve Outcome: Progressing   Problem: Clinical Measurements: Goal: Ability to maintain clinical measurements within normal limits will improve Outcome: Progressing Goal: Will remain free from infection Outcome: Progressing Goal: Diagnostic test results will improve Outcome: Progressing Goal: Respiratory complications will improve Outcome: Progressing Goal: Cardiovascular complication will be avoided Outcome: Progressing   Problem: Activity: Goal: Risk for activity intolerance will decrease Outcome: Progressing   Problem: Nutrition: Goal: Adequate nutrition will be maintained Outcome: Progressing   Problem: Coping: Goal: Level of anxiety will decrease Outcome: Progressing   Problem: Elimination: Goal: Will not experience complications related to bowel motility Outcome: Progressing Goal: Will not experience complications related to urinary retention Outcome: Progressing   Problem: Pain Managment: Goal: General experience of comfort will improve and/or be controlled Outcome: Progressing   Problem: Safety: Goal: Ability to remain free from injury will improve Outcome: Progressing   Problem: Skin Integrity: Goal: Risk for impaired skin integrity will decrease Outcome: Progressing

## 2024-04-03 NOTE — Progress Notes (Signed)
 PROGRESS NOTE        PATIENT DETAILS Name: John [name] Age: 63 y.o. Sex: male Date of Birth: 12-24-1960 Admit Date: 03/24/2024 Admitting Physician John DELENA Sharps, MD [name], John BROCKS, MD  Brief Summary: Patient is a 63 y.o.  male with history of Parkinson's disease, recent PE on anticoagulation-presented with recurrent falls-confusion.  Underwent multiple attempts for MRI imaging of brain/spine-but due to lack of patient cooperation-poor study due to significant motion artifact, subsequently underwent MRI under anesthesia-which showed SDH and severe C-spine stenosis.  Significant events: 7/9>> admit to [facility]  Significant studies: 7/8>> CXR: No PNA 7/8>> x-ray pelvis: No fracture.  7/8>> CT head: No acute intracranial abnormality 7/8>> CT maxillofacial: Redemonstrated fractures involving the right maxillary sinus/orbit-stable since 6/25.  No new fractures. 7/8>> CT C-spine: No fracture. 7/9>> x-ray left knee: No fracture. 7/9>> CT head: No acute intracranial abnormality. 7/9>> CT C-spine: No acute fracture. 7/10>> MRI C-spine/T-spine/L-spine: Motion artifact-limited study. 7/14>> MRI brain: SDH overlying bilateral cerebral convexities. 7/14>> MRI C-spine: Severe spinal canal stenosis 7/14>> MRI T-spine: No signal abnormality within the thoracic spinal cord. 7/14>> MRI L-spine: Acute fracture of the S3 sacral segment (demonstrated on prior CT 6/25).  Lumbar spondylosis. 7/14>> CT head: Stable subacute to chronic 6 mm right and chronic 4 mm left subdural hematoma. 7/15-7/16>> LTM EEG: No seizures. 7/16-7/17>> LTM EEG: No seizures.  Significant microbiology data: 7/9>> blood culture:  Procedures: None  Consults: Neurology Neurosurgery  Subjective: No major issues overnight-appears unchanged-still inquiring as to when he can go home.  Moving all 4 extremities.  Objective: Vitals: Blood pressure (!) 112/55, pulse 99, temperature 98 F (36.7  C), temperature source Oral, resp. rate 14, height 6' 2 (1.88 m), weight 118 kg, SpO2 94%.   Exam: Awake/alert Speech clear Somewhat confused but redirectable today Moving all 4 extremities-easily lifting both legs off the bed.  Pertinent Labs/Radiology:    Latest Ref Rng & Units 04/03/2024    6:04 AM 04/02/2024    2:00 AM 03/30/2024   10:15 AM  CBC  WBC 4.0 - 10.5 K/uL 6.3  6.2  9.8   Hemoglobin 13.0 - 17.0 g/dL 89.9  9.1  9.7   Hematocrit 39.0 - 52.0 % 32.4  29.9  31.0   Platelets 150 - 400 K/uL 366  381  399     Lab Results  Component Value Date   NA 141 03/30/2024   K 4.2 03/30/2024   CL 107 03/30/2024   CO2 21 (L) 03/30/2024    Assessment/Plan: Frequent falls with right cheek hematoma, left forehead laceration (sutured 7/8 in the ED) right maxillary sinus and orbit fracture (present prior to this admit), S3 sacral segment fracture (present prior to this admit) Likely related to gait instability/autonomic dysfunction in the setting of Parkinson's disease-along with degenerative disc disease/C-spine stenosis. PT/OT eval SNF recommended Order to remove sutures placed 7/19.  SDH Traumatic Tolerating IV heparin  Neurosurgery following-repeat CT planned next several days.  Cervical spinal stenosis Neurosurgery following-would ongoing considerations for surgical intervention at some point-based on patient's clinical progress.  Acute metabolic encephalopathy Felt to be multifactorial-felt to be secondary to progressive Parkinson's disease related dementia-superimposed on potential postconcussive syndrome/SDH/borderline vitamin B12 deficiency/UTI Mental status somewhat improved but continues to wax and wane. LTM EEG x 48 hours negative for seizures. Neurology following.  Complicated UTI Completed IV Rocephin  course-unfortunately urine  culture was never sent.  Parkinson's disease Continue Sinemet   Recent pulmonary embolism On IV heparin  given SDH Once okay with  neurosurgery-switch to DOAC.  BPH with acute urinary retention Failed voiding trial-Foley catheter reinserted 7/13 Outpatient follow-up with urology-will DC with Foley catheter.  Borderline vitamin B12 deficiency Being supplemented.  B6 deficiency Continue supplementation.  Class 1 Obesity: Estimated body mass index is 33.4 kg/m as calculated from the following:   Height as of this encounter: 6' 2 (1.88 m).   Weight as of this encounter: 118 kg.   Code status:   Code Status: Full Code   DVT Prophylaxis: Place TED hose Start: 03/26/24 0929 IV heparin    Family Communication: Sister-John [name]-[phone] updated 7/17   Disposition Plan: Status is: Inpatient Remains inpatient appropriate because: Severity of illness   Planned Discharge Destination:Skilled nursing facility   Diet: Diet Order             Diet regular Room service appropriate? Yes; Fluid consistency: Thin  Diet effective now                     Antimicrobial agents: Anti-infectives (From admission, onward)    Start     Dose/Rate Route Frequency Ordered Stop   03/24/24 2000  cefTRIAXone  (ROCEPHIN ) 1 g in sodium chloride  0.9 % 100 mL IVPB        1 g 200 mL/hr over 30 Minutes Intravenous Every 24 hours 03/24/24 1150 03/30/24 2226        MEDICATIONS: Scheduled Meds:  carbidopa -levodopa   1 tablet Oral 6 times per day   Chlorhexidine  Gluconate Cloth  6 each Topical Daily   cyanocobalamin   1,000 mcg Subcutaneous Q30 days   feeding supplement  237 mL Oral BID BM   pyridOXINE   50 mg Oral Daily   senna-docusate  1 tablet Oral BID   sodium chloride  flush  3 mL Intravenous Q12H   thiamine  (VITAMIN B1) injection  100 mg Intravenous Daily   vitamin E   400 Units Oral Daily   zinc  sulfate (50mg  elemental zinc )  220 mg Oral Daily   Continuous Infusions:  heparin  1,650 Units/hr (04/03/24 1141)   PRN Meds:.acetaminophen  **OR** acetaminophen , albuterol , LORazepam , ondansetron  **OR** ondansetron   (ZOFRAN ) IV, sodium chloride  flush   I have personally reviewed following labs and imaging studies  LABORATORY DATA: CBC: Recent Labs  Lab 03/28/24 0742 03/29/24 0605 03/30/24 1015 04/02/24 0200 04/03/24 0604  WBC 5.1 5.5 9.8 6.2 6.3  HGB 8.3* 9.0* 9.7* 9.1* 10.0*  HCT 26.0* 28.8* 31.0* 29.9* 32.4*  MCV 86.1 86.0 86.4 88.7 87.6  PLT 312 342 399 381 366    Basic Metabolic Panel: Recent Labs  Lab 03/28/24 0742 03/29/24 0605 03/30/24 1015 04/01/24 0502  NA 136 137 141  --   K 3.7 3.9 4.2  --   CL 107 106 107  --   CO2 20* 20* 21*  --   GLUCOSE 92 83 89  --   BUN <5* <5* 12  --   CREATININE 0.88 0.95 1.07  --   CALCIUM 8.3* 8.6* 9.0  --   MG 2.1 1.8 2.0  --   PHOS 2.5 2.6 1.5* 2.1*    GFR: Estimated Creatinine Clearance: 96.4 mL/min (by C-G formula based on SCr of 1.07 mg/dL).  Liver Function Tests: No results for input(s): AST, ALT, ALKPHOS, BILITOT, PROT, ALBUMIN in the last 168 hours. No results for input(s): LIPASE, AMYLASE in the last 168 hours. No results for input(s): AMMONIA in the  last 168 hours.  Coagulation Profile: No results for input(s): INR, PROTIME in the last 168 hours.  Cardiac Enzymes: No results for input(s): CKTOTAL, CKMB, CKMBINDEX, TROPONINI in the last 168 hours.  BNP (last 3 results) No results for input(s): PROBNP in the last 8760 hours.  Lipid Profile: No results for input(s): CHOL, HDL, LDLCALC, TRIG, CHOLHDL, LDLDIRECT in the last 72 hours.  Thyroid Function Tests: No results for input(s): TSH, T4TOTAL, FREET4, T3FREE, THYROIDAB in the last 72 hours.  Anemia Panel: No results for input(s): VITAMINB12, FOLATE, FERRITIN, TIBC, IRON, RETICCTPCT in the last 72 hours.  Urine analysis:    Component Value Date/Time   COLORURINE AMBER (A) 03/23/2024 1845   APPEARANCEUR HAZY (A) 03/23/2024 1845   LABSPEC 1.016 03/23/2024 1845   LABSPEC 1.010 02/10/2024 1448    PHURINE 6.0 03/23/2024 1845   GLUCOSEU NEGATIVE 03/23/2024 1845   HGBUR SMALL (A) 03/23/2024 1845   BILIRUBINUR NEGATIVE 03/23/2024 1845   BILIRUBINUR negative 02/10/2024 1448   KETONESUR 5 (A) 03/23/2024 1845   PROTEINUR NEGATIVE 03/23/2024 1845   NITRITE NEGATIVE 03/23/2024 1845   LEUKOCYTESUR NEGATIVE 03/23/2024 1845    Sepsis Labs: Lactic Acid, Venous    Component Value Date/Time   LATICACIDVEN 1.2 03/16/2024 2143    MICROBIOLOGY: No results found for this or any previous visit (from the past 240 hours).   RADIOLOGY STUDIES/RESULTS: No results found.    LOS: 10 days   Donalda Applebaum, MD  [facility]    To contact the attending provider between 7A-7P or the covering provider during after hours 7P-7A, please log into the web site [email or web address] and access using universal [place] password for that web site. If you do not have the password, please call the hospital operator.  04/03/2024, 1:07 PM

## 2024-04-04 DIAGNOSIS — N3000 Acute cystitis without hematuria: Secondary | ICD-10-CM | POA: Diagnosis not present

## 2024-04-04 DIAGNOSIS — G9341 Metabolic encephalopathy: Secondary | ICD-10-CM | POA: Diagnosis not present

## 2024-04-04 DIAGNOSIS — S0191XA Laceration without foreign body of unspecified part of head, initial encounter: Secondary | ICD-10-CM | POA: Diagnosis not present

## 2024-04-04 DIAGNOSIS — R296 Repeated falls: Secondary | ICD-10-CM | POA: Diagnosis not present

## 2024-04-04 LAB — APTT
aPTT: 103 s — ABNORMAL HIGH (ref 24–36)
aPTT: 76 s — ABNORMAL HIGH (ref 24–36)

## 2024-04-04 LAB — CBC
HCT: 30 % — ABNORMAL LOW (ref 39.0–52.0)
Hemoglobin: 9.4 g/dL — ABNORMAL LOW (ref 13.0–17.0)
MCH: 27.2 pg (ref 26.0–34.0)
MCHC: 31.3 g/dL (ref 30.0–36.0)
MCV: 87 fL (ref 80.0–100.0)
Platelets: 341 K/uL (ref 150–400)
RBC: 3.45 MIL/uL — ABNORMAL LOW (ref 4.22–5.81)
RDW: 15.3 % (ref 11.5–15.5)
WBC: 5.8 K/uL (ref 4.0–10.5)
nRBC: 0 % (ref 0.0–0.2)

## 2024-04-04 LAB — HEPARIN LEVEL (UNFRACTIONATED)
Heparin Unfractionated: 0.57 [IU]/mL (ref 0.30–0.70)
Heparin Unfractionated: 0.45 [IU]/mL (ref 0.30–0.70)

## 2024-04-04 MED ORDER — OXYCODONE-ACETAMINOPHEN 5-325 MG PO TABS
1.0000 | ORAL_TABLET | Freq: Once | ORAL | Status: AC
  Administered 2024-04-04: 1 via ORAL
  Filled 2024-04-04: qty 1

## 2024-04-04 MED ORDER — TRAZODONE HCL 50 MG PO TABS
50.0000 mg | ORAL_TABLET | Freq: Every evening | ORAL | Status: DC | PRN
  Administered 2024-04-04 – 2024-04-16 (×7): 50 mg via ORAL
  Filled 2024-04-04 (×7): qty 1

## 2024-04-04 NOTE — Progress Notes (Signed)
 PHARMACY - ANTICOAGULATION CONSULT NOTE  Pharmacy Consult for Heparin  Indication: pulmonary embolus  Allergies  Allergen Reactions   Flomax  [Tamsulosin  Hcl] Anxiety and Other (See Comments)    Jittery     Patient Measurements: Height: 6' 2 (188 cm) Weight: 118 kg (260 lb 2.3 oz) IBW/kg (Calculated) : 82.2 HEPARIN  DW (KG): 107.3  Vital Signs: Temp: 98.1 F (36.7 C) (07/20 0732) Temp Source: Oral (07/20 0732) BP: 120/75 (07/20 0732) Pulse Rate: 81 (07/20 0732)  Labs: Recent Labs    04/02/24 0200 04/03/24 0604 04/03/24 1953 04/04/24 0725  HGB 9.1* 10.0*  --  9.4*  HCT 29.9* 32.4*  --  30.0*  PLT 381 366  --  341  APTT 84* 66* 86* 103*  HEPARINUNFRC  --  0.47 0.47 0.57    Estimated Creatinine Clearance: 96.4 mL/min (by C-G formula based on SCr of 1.07 mg/dL).   Assessment: Pt recently started on apixaban  prior to current admission for acute PE (03/10/24), which is on hold due to bilateral subdural hematoma. Last apixaban  dose was on 7/14 at 0932. Previously aPTT subtherapeutic on 1500 to 1900 units/hr but now uptrending despite decreases in rate. Will monitor aPTT levels to guide therapy.  PTT 103/HL 0.57. Levels have not correlated for some reason. We will decrease rate and check anther level.  Goal of Therapy:  Heparin  level 0.3 - 0.5 units/ml aPTT level 66 - 85 sec Monitor platelets by anticoagulation protocol: Yes   Plan:  Decrease heparin  infusion 1500 units/hr Check 6 hr PTT/HL Daily heparin  level, CBC F/u surgery plan  Sergio Batch, PharmD, BCIDP, AAHIVP, CPP Infectious Disease Pharmacist 04/04/2024 10:38 AM

## 2024-04-04 NOTE — Plan of Care (Signed)

## 2024-04-04 NOTE — Plan of Care (Signed)

## 2024-04-04 NOTE — Progress Notes (Signed)
 PHARMACY - ANTICOAGULATION CONSULT NOTE  Pharmacy Consult for Heparin  Indication: pulmonary embolus  Allergies  Allergen Reactions   Flomax  [Tamsulosin  Hcl] Anxiety and Other (See Comments)    Jittery     Patient Measurements: Height: 6' 2 (188 cm) Weight: 118 kg (260 lb 2.3 oz) IBW/kg (Calculated) : 82.2 HEPARIN  DW (KG): 107.3  Vital Signs: Temp: 98.6 F (37 C) (07/20 1706) Temp Source: Oral (07/20 1706) BP: 134/86 (07/20 1706) Pulse Rate: 95 (07/20 1250)  Labs: Recent Labs    04/02/24 0200 04/02/24 0200 04/03/24 0604 04/03/24 1953 04/04/24 0725 04/04/24 1732  HGB 9.1*  --  10.0*  --  9.4*  --   HCT 29.9*  --  32.4*  --  30.0*  --   PLT 381  --  366  --  341  --   APTT 84*  --  66* 86* 103* 76*  HEPARINUNFRC  --    < > 0.47 0.47 0.57 0.45   < > = values in this interval not displayed.    Estimated Creatinine Clearance: 96.4 mL/min (by C-G formula based on SCr of 1.07 mg/dL).   Assessment: Pt recently started on apixaban  prior to current admission for acute PE (03/10/24), which is on hold due to bilateral subdural hematoma. Last apixaban  dose was on 7/14 at 0932. Previously aPTT subtherapeutic on 1500 to 1900 units/hr but now uptrending despite decreases in rate. Will monitor aPTT levels to guide therapy.  PTT 76/HL 0.47. Levels therapeutic and correlating now. We will continue same rate and check  levels in AM; if continue to correlate, will stop checking PTTs.  Goal of Therapy:  Heparin  level 0.3 - 0.5 units/ml aPTT level 66 - 85 sec Monitor platelets by anticoagulation protocol: Yes   Plan:  Continue heparin  infusion at 1500 units/hr Daily heparin  level, PTT and CBC F/u surgery plan  Rocky Slade, PharmD, BCPS 04/04/2024 6:35 PM  Please check AMION for all [facility] phone numbers After 10:00 PM, call Main Pharmacy [phone]

## 2024-04-04 NOTE — Progress Notes (Signed)
 Sutures placed at forehead removed at this time

## 2024-04-04 NOTE — Progress Notes (Signed)
 PROGRESS NOTE        PATIENT DETAILS Name: John [name] Age: 63 y.o. Sex: male Date of Birth: 25-Aug-1961 Admit Date: 03/24/2024 Admitting Physician [name], MD [name], [name], MD  Brief Summary: Patient is a 63 y.o.  male with history of Parkinson's disease, recent PE on anticoagulation-presented with recurrent falls-confusion.  Underwent multiple attempts for MRI imaging of brain/spine-but due to lack of patient cooperation-poor study due to significant motion artifact, subsequently underwent MRI under anesthesia-which showed SDH and severe C-spine stenosis.  Significant events: 7/9>> admit to [facility]  Significant studies: 7/8>> CXR: No PNA 7/8>> x-ray pelvis: No fracture.  7/8>> CT head: No acute intracranial abnormality 7/8>> CT maxillofacial: Redemonstrated fractures involving the right maxillary sinus/orbit-stable since 6/25.  No new fractures. 7/8>> CT C-spine: No fracture. 7/9>> x-ray left knee: No fracture. 7/9>> CT head: No acute intracranial abnormality. 7/9>> CT C-spine: No acute fracture. 7/10>> MRI C-spine/T-spine/L-spine: Motion artifact-limited study. 7/14>> MRI brain: SDH overlying bilateral cerebral convexities. 7/14>> MRI C-spine: Severe spinal canal stenosis 7/14>> MRI T-spine: No signal abnormality within the thoracic spinal cord. 7/14>> MRI L-spine: Acute fracture of the S3 sacral segment (demonstrated on prior CT 6/25).  Lumbar spondylosis. 7/14>> CT head: Stable subacute to chronic 6 mm right and chronic 4 mm left subdural hematoma. 7/15-7/16>> LTM EEG: No seizures. 7/16-7/17>> LTM EEG: No seizures.  Significant microbiology data: 7/9>> blood culture:  Procedures: None  Consults: Neurology Neurosurgery  Subjective: No major issues overnight-lying comfortably in bed.  Pleasantly confused and easily redirectable.  Moving all 4 extremities.  Objective: Vitals: Blood pressure 120/75, pulse 81, temperature 98.1  F (36.7 C), temperature source Oral, resp. rate 18, height 6' 2 (1.88 m), weight 118 kg, SpO2 98%.   Exam: Awake/alert-mildly confused but easily redirectable. Speech clear Moving all 4 extremities symmetrically.  Pertinent Labs/Radiology:    Latest Ref Rng & Units 04/04/2024    7:25 AM 04/03/2024    6:04 AM 04/02/2024    2:00 AM  CBC  WBC 4.0 - 10.5 K/uL 5.8  6.3  6.2   Hemoglobin 13.0 - 17.0 g/dL 9.4  89.9  9.1   Hematocrit 39.0 - 52.0 % 30.0  32.4  29.9   Platelets 150 - 400 K/uL 341  366  381     Lab Results  Component Value Date   NA 141 03/30/2024   K 4.2 03/30/2024   CL 107 03/30/2024   CO2 21 (L) 03/30/2024    Assessment/Plan: Frequent falls with right cheek hematoma, left forehead laceration (sutured 7/8 in the ED) right maxillary sinus and orbit fracture (present prior to this admit), S3 sacral segment fracture (present prior to this admit) Likely related to gait instability/autonomic dysfunction in the setting of Parkinson's disease-along with degenerative disc disease/C-spine stenosis. PT/OT eval SNF recommended Sutures removed 7/20  SDH Traumatic Tolerating IV heparin -without any neurological worsening by exam. Neurosurgery following-repeat CT tomorrow morning (last CT was on 7/14)  Cervical spinal stenosis Neurosurgery following-would ongoing considerations for surgical intervention at some point-based on patient's clinical progress.  Acute metabolic encephalopathy Felt to be multifactorial-felt to be secondary to progressive Parkinson's disease related dementia-superimposed on potential postconcussive syndrome/SDH/borderline vitamin B12 deficiency/UTI Mental status somewhat improved but continues to wax and wane. LTM EEG x 48 hours negative for seizures. Neurology following.  Complicated UTI Completed IV Rocephin  course-unfortunately urine culture  was never sent.  Parkinson's disease Continue Sinemet   Recent pulmonary embolism On IV heparin  given  SDH Once okay with neurosurgery-switch to DOAC.  BPH with acute urinary retention Failed voiding trial-Foley catheter reinserted 7/13 Outpatient follow-up with urology-will DC with Foley catheter.  Borderline vitamin B12 deficiency Being supplemented.  B6 deficiency Continue supplementation.  Class 1 Obesity: Estimated body mass index is 33.4 kg/m as calculated from the following:   Height as of this encounter: 6' 2 (1.88 m).   Weight as of this encounter: 118 kg.   Code status:   Code Status: Full Code   DVT Prophylaxis: Place TED hose Start: 03/26/24 0929 IV heparin    Family Communication: Sister-[name] updated 7/17   Disposition Plan: Status is: Inpatient Remains inpatient appropriate because: Severity of illness   Planned Discharge Destination:Skilled nursing facility   Diet: Diet Order             Diet regular Room service appropriate? Yes; Fluid consistency: Thin  Diet effective now                     Antimicrobial agents: Anti-infectives (From admission, onward)    Start     Dose/Rate Route Frequency Ordered Stop   03/24/24 2000  cefTRIAXone  (ROCEPHIN ) 1 g in sodium chloride  0.9 % 100 mL IVPB        1 g 200 mL/hr over 30 Minutes Intravenous Every 24 hours 03/24/24 1150 03/30/24 2226        MEDICATIONS: Scheduled Meds:  carbidopa -levodopa   1 tablet Oral 6 times per day   Chlorhexidine  Gluconate Cloth  6 each Topical Daily   cyanocobalamin   1,000 mcg Subcutaneous Q30 days   feeding supplement  237 mL Oral BID BM   pyridOXINE   50 mg Oral Daily   senna-docusate  1 tablet Oral BID   sodium chloride  flush  3 mL Intravenous Q12H   thiamine   100 mg Oral Daily   vitamin E   400 Units Oral Daily   zinc  sulfate (50mg  elemental zinc )  220 mg Oral Daily   Continuous Infusions:  heparin  1,600 Units/hr (04/03/24 2114)   PRN Meds:.acetaminophen  **OR** acetaminophen , albuterol , LORazepam , ondansetron  **OR** ondansetron  (ZOFRAN ) IV,  sodium chloride  flush, traZODone    I have personally reviewed following labs and imaging studies  LABORATORY DATA: CBC: Recent Labs  Lab 03/29/24 0605 03/30/24 1015 04/02/24 0200 04/03/24 0604 04/04/24 0725  WBC 5.5 9.8 6.2 6.3 5.8  HGB 9.0* 9.7* 9.1* 10.0* 9.4*  HCT 28.8* 31.0* 29.9* 32.4* 30.0*  MCV 86.0 86.4 88.7 87.6 87.0  PLT 342 399 381 366 341    Basic Metabolic Panel: Recent Labs  Lab 03/29/24 0605 03/30/24 1015 04/01/24 0502  NA 137 141  --   K 3.9 4.2  --   CL 106 107  --   CO2 20* 21*  --   GLUCOSE 83 89  --   BUN <5* 12  --   CREATININE 0.95 1.07  --   CALCIUM 8.6* 9.0  --   MG 1.8 2.0  --   PHOS 2.6 1.5* 2.1*    GFR: Estimated Creatinine Clearance: 96.4 mL/min (by C-G formula based on SCr of 1.07 mg/dL).  Liver Function Tests: No results for input(s): AST, ALT, ALKPHOS, BILITOT, PROT, ALBUMIN in the last 168 hours. No results for input(s): LIPASE, AMYLASE in the last 168 hours. No results for input(s): AMMONIA in the last 168 hours.  Coagulation Profile: No results for input(s): INR, PROTIME in the last  168 hours.  Cardiac Enzymes: No results for input(s): CKTOTAL, CKMB, CKMBINDEX, TROPONINI in the last 168 hours.  BNP (last 3 results) No results for input(s): PROBNP in the last 8760 hours.  Lipid Profile: No results for input(s): CHOL, HDL, LDLCALC, TRIG, CHOLHDL, LDLDIRECT in the last 72 hours.  Thyroid Function Tests: No results for input(s): TSH, T4TOTAL, FREET4, T3FREE, THYROIDAB in the last 72 hours.  Anemia Panel: No results for input(s): VITAMINB12, FOLATE, FERRITIN, TIBC, IRON, RETICCTPCT in the last 72 hours.  Urine analysis:    Component Value Date/Time   COLORURINE AMBER (A) 03/23/2024 1845   APPEARANCEUR HAZY (A) 03/23/2024 1845   LABSPEC 1.016 03/23/2024 1845   LABSPEC 1.010 02/10/2024 1448   PHURINE 6.0 03/23/2024 1845   GLUCOSEU NEGATIVE 03/23/2024  1845   HGBUR SMALL (A) 03/23/2024 1845   BILIRUBINUR NEGATIVE 03/23/2024 1845   BILIRUBINUR negative 02/10/2024 1448   KETONESUR 5 (A) 03/23/2024 1845   PROTEINUR NEGATIVE 03/23/2024 1845   NITRITE NEGATIVE 03/23/2024 1845   LEUKOCYTESUR NEGATIVE 03/23/2024 1845    Sepsis Labs: Lactic Acid, Venous    Component Value Date/Time   LATICACIDVEN 1.2 03/16/2024 2143    MICROBIOLOGY: No results found for this or any previous visit (from the past 240 hours).   RADIOLOGY STUDIES/RESULTS: No results found.    LOS: 11 days   Donalda Applebaum, MD  [facility]    To contact the attending provider between 7A-7P or the covering provider during after hours 7P-7A, please log into the web site [email or web address] and access using universal [place] password for that web site. If you do not have the password, please call the hospital operator.  04/04/2024, 10:05 AM

## 2024-04-04 NOTE — Plan of Care (Signed)
  Problem: Safety: Goal: Non-violent Restraint(s) 04/04/2024 1830 by Murray Yetta RAMAN, RN Outcome: Progressing 04/04/2024 1830 by Murray Yetta RAMAN, RN Outcome: Progressing   Problem: Education: Goal: Knowledge of General Education information will improve Description: Including pain rating scale, medication(s)/side effects and non-pharmacologic comfort measures 04/04/2024 1830 by Murray Yetta RAMAN, RN Outcome: Progressing 04/04/2024 1830 by Murray Yetta RAMAN, RN Outcome: Progressing   Problem: Health Behavior/Discharge Planning: Goal: Ability to manage health-related needs will improve 04/04/2024 1830 by Murray Yetta RAMAN, RN Outcome: Progressing 04/04/2024 1830 by Murray Yetta RAMAN, RN Outcome: Progressing   Problem: Clinical Measurements: Goal: Ability to maintain clinical measurements within normal limits will improve 04/04/2024 1830 by Murray Yetta RAMAN, RN Outcome: Progressing 04/04/2024 1830 by Murray Yetta RAMAN, RN Outcome: Progressing Goal: Will remain free from infection 04/04/2024 1830 by Murray Yetta RAMAN, RN Outcome: Progressing 04/04/2024 1830 by Murray Yetta RAMAN, RN Outcome: Progressing Goal: Diagnostic test results will improve 04/04/2024 1830 by Murray Yetta RAMAN, RN Outcome: Progressing 04/04/2024 1830 by Murray Yetta RAMAN, RN Outcome: Progressing Goal: Respiratory complications will improve 04/04/2024 1830 by Murray Yetta RAMAN, RN Outcome: Progressing 04/04/2024 1830 by Murray Yetta RAMAN, RN Outcome: Progressing Goal: Cardiovascular complication will be avoided 04/04/2024 1830 by Murray Yetta RAMAN, RN Outcome: Progressing 04/04/2024 1830 by Murray Yetta RAMAN, RN Outcome: Progressing   Problem: Activity: Goal: Risk for activity intolerance will decrease 04/04/2024 1830 by Murray Yetta RAMAN, RN Outcome: Progressing 04/04/2024 1830 by Murray Yetta RAMAN, RN Outcome: Progressing   Problem: Nutrition: Goal: Adequate nutrition will be maintained 04/04/2024 1830 by Murray Yetta RAMAN,  RN Outcome: Progressing 04/04/2024 1830 by Murray Yetta RAMAN, RN Outcome: Progressing   Problem: Coping: Goal: Level of anxiety will decrease 04/04/2024 1830 by Murray Yetta RAMAN, RN Outcome: Progressing 04/04/2024 1830 by Murray Yetta RAMAN, RN Outcome: Progressing   Problem: Elimination: Goal: Will not experience complications related to bowel motility 04/04/2024 1830 by Murray Yetta RAMAN, RN Outcome: Progressing 04/04/2024 1830 by Murray Yetta RAMAN, RN Outcome: Progressing Goal: Will not experience complications related to urinary retention 04/04/2024 1830 by Murray Yetta RAMAN, RN Outcome: Progressing 04/04/2024 1830 by Murray Yetta RAMAN, RN Outcome: Progressing   Problem: Pain Managment: Goal: General experience of comfort will improve and/or be controlled 04/04/2024 1830 by Murray Yetta RAMAN, RN Outcome: Progressing 04/04/2024 1830 by Murray Yetta RAMAN, RN Outcome: Progressing   Problem: Safety: Goal: Ability to remain free from injury will improve 04/04/2024 1830 by Murray Yetta RAMAN, RN Outcome: Progressing 04/04/2024 1830 by Murray Yetta RAMAN, RN Outcome: Progressing   Problem: Skin Integrity: Goal: Risk for impaired skin integrity will decrease 04/04/2024 1830 by Murray Yetta RAMAN, RN Outcome: Progressing 04/04/2024 1830 by Murray Yetta RAMAN, RN Outcome: Progressing

## 2024-04-05 ENCOUNTER — Inpatient Hospital Stay (HOSPITAL_COMMUNITY)

## 2024-04-05 DIAGNOSIS — R296 Repeated falls: Secondary | ICD-10-CM | POA: Diagnosis not present

## 2024-04-05 DIAGNOSIS — N3000 Acute cystitis without hematuria: Secondary | ICD-10-CM | POA: Diagnosis not present

## 2024-04-05 DIAGNOSIS — S0191XA Laceration without foreign body of unspecified part of head, initial encounter: Secondary | ICD-10-CM | POA: Diagnosis not present

## 2024-04-05 DIAGNOSIS — G9341 Metabolic encephalopathy: Secondary | ICD-10-CM | POA: Diagnosis not present

## 2024-04-05 LAB — CBC
HCT: 31.9 % — ABNORMAL LOW (ref 39.0–52.0)
Hemoglobin: 9.8 g/dL — ABNORMAL LOW (ref 13.0–17.0)
MCH: 27.1 pg (ref 26.0–34.0)
MCHC: 30.7 g/dL (ref 30.0–36.0)
MCV: 88.1 fL (ref 80.0–100.0)
Platelets: 358 K/uL (ref 150–400)
RBC: 3.62 MIL/uL — ABNORMAL LOW (ref 4.22–5.81)
RDW: 15.5 % (ref 11.5–15.5)
WBC: 7 K/uL (ref 4.0–10.5)
nRBC: 0 % (ref 0.0–0.2)

## 2024-04-05 LAB — MISC LABCORP TEST (SEND OUT): Labcorp test code: 70115

## 2024-04-05 LAB — HEPARIN LEVEL (UNFRACTIONATED): Heparin Unfractionated: 0.42 [IU]/mL (ref 0.30–0.70)

## 2024-04-05 LAB — APTT: aPTT: 71 s — ABNORMAL HIGH (ref 24–36)

## 2024-04-05 MED ORDER — OXYCODONE-ACETAMINOPHEN 5-325 MG PO TABS
1.0000 | ORAL_TABLET | Freq: Once | ORAL | Status: DC
  Filled 2024-04-05: qty 1

## 2024-04-05 NOTE — Progress Notes (Signed)
 Physical Therapy Treatment Patient Details Name: John [name] MRN: [id number] DOB: 11-07-1960 Today's Date: 04/05/2024   History of Present Illness Pt is a 63 year old man admitted from [facility] SNF with frequent falls and AMS. Head CT negative. +facial lacerations. MRI shows cervical stenosis and small bilat SDH; neurosurgery following. PMH: Parkinsons Disease, HTN, CVA, PE, anemia, recent B PEs, UTI.    PT Comments  Pt with fair tolerance to treatment today. Pt today was able to stand with +2 Min A however ambulation was deferred due to positive orthostatics. BP: 129/88 supine, BP: 116/80 seated, BP: 86/61 standing, BP: 109/70 supine. Pt did demonstrate improved command following today compared to previous sessions. No change in DC/DME recs at this time. PT will continue to follow.     If plan is discharge home, recommend the following: A lot of help with bathing/dressing/bathroom;Assist for transportation;Help with stairs or ramp for entrance;Supervision due to cognitive status;A lot of help with walking and/or transfers   Can travel by private vehicle     No  Equipment Recommendations  None recommended by PT    Recommendations for Other Services       Precautions / Restrictions Precautions Precautions: Fall Recall of Precautions/Restrictions: Impaired Precaution/Restrictions Comments: watch BP/ HR Restrictions Weight Bearing Restrictions Per Provider Order: No     Mobility  Bed Mobility Overal bed mobility: Needs Assistance Bed Mobility: Supine to Sit, Sit to Supine     Supine to sit: Contact guard Sit to supine: Contact guard assist   General bed mobility comments: CGA for safety.    Transfers Overall transfer level: Needs assistance Equipment used: Rolling walker (2 wheels) Transfers: Sit to/from Stand Sit to Stand: +2 physical assistance, Min assist, +2 safety/equipment           General transfer comment: +2 Min A to stand from bed. Ambulation  deferred due to positive orthostatics.    Ambulation/Gait               General Gait Details: Ambulation deferred due to orthostatics   Stairs             Wheelchair Mobility     Tilt Bed    Modified Rankin (Stroke Patients Only)       Balance Overall balance assessment: Needs assistance Sitting-balance support: No upper extremity supported, Feet supported Sitting balance-Leahy Scale: Fair Sitting balance - Comments: initially poor with posterior bias, improved to fair Postural control: Posterior lean Standing balance support: Bilateral upper extremity supported, During functional activity, Reliant on assistive device for balance Standing balance-Leahy Scale: Poor Standing balance comment: Reliant on RW and up to +2 mod assist due to posterior lean                            Communication Communication Communication: Impaired Factors Affecting Communication: Reduced clarity of speech  Cognition Arousal: Alert Behavior During Therapy: Flat affect   PT - Cognitive impairments: Orientation, Awareness, Memory, Attention, Safety/Judgement, Problem solving   Orientation impairments: Situation, Place, Time                   PT - Cognition Comments: Poor safety awareness and awareness of deficits with pt stating he could go home tomorrow and ambulate with his quad cane Following commands: Impaired Following commands impaired: Follows one step commands with increased time    Cueing Cueing Techniques: Verbal cues, Tactile cues  Exercises      General Comments  General comments (skin integrity, edema, etc.): BP: 129/88 supine, BP: 116/80 seated, BP: 86/61 standing, BP: 109/70 supine      Pertinent Vitals/Pain Pain Assessment Pain Assessment: No/denies pain    Home Living                          Prior Function            PT Goals (current goals can now be found in the care plan section) Progress towards PT goals:  Progressing toward goals    Frequency    Min 2X/week      PT Plan      Co-evaluation              AM-PAC PT 6 Clicks Mobility   Outcome Measure  Help needed turning from your back to your side while in a flat bed without using bedrails?: A Little Help needed moving from lying on your back to sitting on the side of a flat bed without using bedrails?: A Little Help needed moving to and from a bed to a chair (including a wheelchair)?: A Lot Help needed standing up from a chair using your arms (e.g., wheelchair or bedside chair)?: A Lot Help needed to walk in hospital room?: Total Help needed climbing 3-5 steps with a railing? : Total 6 Click Score: 12    End of Session Equipment Utilized During Treatment: Gait belt Activity Tolerance: Patient tolerated treatment well Patient left: in bed;with call bell/phone within reach;with bed alarm set Nurse Communication: Mobility status PT Visit Diagnosis: Unsteadiness on feet (R26.81);Other abnormalities of gait and mobility (R26.89);Other symptoms and signs involving the nervous system (R29.898)     Time: 1430-1453 PT Time Calculation (min) (ACUTE ONLY): 23 min  Charges:    $Therapeutic Activity: 23-37 mins PT General Charges $$ ACUTE PT VISIT: 1 Visit                     Ranessa Kosta B, PT, DPT Acute Rehab Services [phone]    Anayeli Arel 04/05/2024, 4:31 PM

## 2024-04-05 NOTE — Plan of Care (Signed)
  Problem: Health Behavior/Discharge Planning: Goal: Ability to manage health-related needs will improve Outcome: Progressing   Problem: Clinical Measurements: Goal: Will remain free from infection Outcome: Progressing   Problem: Coping: Goal: Level of anxiety will decrease Outcome: Progressing   Problem: Safety: Goal: Ability to remain free from injury will improve Outcome: Progressing   

## 2024-04-05 NOTE — TOC Progression Note (Signed)
 Transition of Care [facility]) - Progression Note    Patient Details  Name: DUVAL MACLEOD MRN: [id number] Date of Birth: November 15, 1960  [facility] of [facility]) CM/SW Contact  [name], LCSW Phone Number: 04/05/2024, 8:33 AM  Clinical Narrative:    CSW continuing to follow.    Expected Discharge Plan: Skilled Nursing Facility Barriers to Discharge: Continued Medical Work up, English as a second language teacher, SNF Pending bed offer  Expected Discharge Plan and Services In-house Referral: Clinical Social Work   Post Acute Care Choice: Skilled Nursing Facility Living arrangements for the past 2 months: Single Family Home, Skilled Nursing Facility                                       Social Determinants of Health (SDOH) Interventions SDOH Screenings   Food Insecurity: Patient Unable To Answer (03/25/2024)  Housing: Unknown (03/25/2024)  Transportation Needs: Patient Unable To Answer (03/25/2024)  Utilities: Patient Unable To Answer (03/25/2024)  Depression (PHQ2-9): Low Risk  (12/10/2023)  Tobacco Use: Medium Risk (03/29/2024)    Readmission Risk Interventions     No data to display

## 2024-04-05 NOTE — Progress Notes (Signed)
 PHARMACY - ANTICOAGULATION CONSULT NOTE  Pharmacy Consult for Heparin  Indication: pulmonary embolus  Allergies  Allergen Reactions   Flomax  [Tamsulosin  Hcl] Anxiety and Other (See Comments)    Jittery     Patient Measurements: Height: 6' 2 (188 cm) Weight: 118 kg (260 lb 2.3 oz) IBW/kg (Calculated) : 82.2 HEPARIN  DW (KG): 107.3  Vital Signs: Temp: 97.6 F (36.4 C) (07/21 0820) Temp Source: Axillary (07/21 0820) BP: 132/73 (07/21 0820) Pulse Rate: 95 (07/21 0820)  Labs: Recent Labs    04/03/24 0604 04/03/24 1953 04/04/24 0725 04/04/24 1732 04/05/24 0621  HGB 10.0*  --  9.4*  --  9.8*  HCT 32.4*  --  30.0*  --  31.9*  PLT 366  --  341  --  358  APTT 66*   < > 103* 76* 71*  HEPARINUNFRC 0.47   < > 0.57 0.45 0.42   < > = values in this interval not displayed.    Estimated Creatinine Clearance: 96.4 mL/min (by C-G formula based on SCr of 1.07 mg/dL).   Assessment: Pt recently started on apixaban  prior to current admission for acute PE (03/10/24), which is on hold due to bilateral subdural hematoma. Last apixaban  dose was on 7/14 at 0932. Previously aPTT subtherapeutic on 1500 to 1900 units/hr but now uptrending despite decreases in rate. Will monitor aPTT levels to guide therapy.   PTT 71/ HL 0.42. Levels therapeutic and correlating x2. Will discontinue PTT levels, keep daily HL.  Goal of Therapy:  Heparin  level 0.3 - 0.5 units/ml Monitor platelets by anticoagulation protocol: Yes   Plan:  Continue heparin  infusion at 1500 units/hr Check heparin  level and CBC daily Continue to monitor H&H and platelets F/u surgery plan  Burdette Gergely, PharmD PGY-1 Pharmacy [facility]  04/05/2024 9:03 AM

## 2024-04-05 NOTE — Progress Notes (Signed)
 PROGRESS NOTE        PATIENT DETAILS Name: John [name] Age: 63 y.o. Sex: male Date of Birth: 1961/04/15 Admit Date: 03/24/2024 Admitting Physician [name], MD [name], [name], MD  Brief Summary: Patient is a 63 y.o.  male with history of Parkinson's disease, recent PE on anticoagulation-presented with recurrent falls-confusion.  Underwent multiple attempts for MRI imaging of brain/spine-but due to lack of patient cooperation-poor study due to significant motion artifact, subsequently underwent MRI under anesthesia-which showed SDH and severe C-spine stenosis.  Significant events: 7/9>> admit to [facility]  Significant studies: 7/8>> CXR: No PNA 7/8>> x-ray pelvis: No fracture.  7/8>> CT head: No acute intracranial abnormality 7/8>> CT maxillofacial: Redemonstrated fractures involving the right maxillary sinus/orbit-stable since 6/25.  No new fractures. 7/8>> CT C-spine: No fracture. 7/9>> x-ray left knee: No fracture. 7/9>> CT head: No acute intracranial abnormality. 7/9>> CT C-spine: No acute fracture. 7/10>> MRI C-spine/T-spine/L-spine: Motion artifact-limited study. 7/14>> MRI brain: SDH overlying bilateral cerebral convexities. 7/14>> MRI C-spine: Severe spinal canal stenosis 7/14>> MRI T-spine: No signal abnormality within the thoracic spinal cord. 7/14>> MRI L-spine: Acute fracture of the S3 sacral segment (demonstrated on prior CT 6/25).  Lumbar spondylosis. 7/14>> CT head: Stable subacute to chronic 6 mm right and chronic 4 mm left subdural hematoma. 7/15-7/16>> LTM EEG: No seizures. 7/16-7/17>> LTM EEG: No seizures. 7/21>> CT head: Small SDH-stable/regressed from prior study  Significant microbiology data: 7/9>> blood culture:  Procedures: None  Consults: Neurology Neurosurgery  Subjective: Lying comfortably in bed-no major issues overnight.  Objective: Vitals: Blood pressure 132/73, pulse 95, temperature 97.6 F (36.4  C), temperature source Axillary, resp. rate 16, height 6' 2 (1.88 m), weight 118 kg, SpO2 99%.   Exam: Sleeping but easily awoke-follows simple commands Chest: Clear to auscultation Abdomen: Soft nontender nondistended Nonfocal exam.  Pertinent Labs/Radiology:    Latest Ref Rng & Units 04/05/2024    6:21 AM 04/04/2024    7:25 AM 04/03/2024    6:04 AM  CBC  WBC 4.0 - 10.5 K/uL 7.0  5.8  6.3   Hemoglobin 13.0 - 17.0 g/dL 9.8  9.4  89.9   Hematocrit 39.0 - 52.0 % 31.9  30.0  32.4   Platelets 150 - 400 K/uL 358  341  366     Lab Results  Component Value Date   NA 141 03/30/2024   K 4.2 03/30/2024   CL 107 03/30/2024   CO2 21 (L) 03/30/2024    Assessment/Plan: Frequent falls with right cheek hematoma, left forehead laceration (sutured 7/8 in the ED) right maxillary sinus and orbit fracture (present prior to this admit), S3 sacral segment fracture (present prior to this admit) Likely related to gait instability/autonomic dysfunction in the setting of Parkinson's disease-along with degenerative disc disease/C-spine stenosis. PT/OT eval SNF recommended Sutures removed 7/20  SDH Traumatic Tolerating IV heparin -without any neurological worsening by exam. Neurosurgery following-repeat CT head on 7/21-stable-with decrease in size of hematomas.  Cervical spinal stenosis Neurosurgery following-would ongoing considerations for surgical intervention at some point-based on patient's clinical progress. Await further recommendations from neurosurgical service.  Acute metabolic encephalopathy Felt to be multifactorial-felt to be secondary to progressive Parkinson's disease related dementia-superimposed on potential postconcussive syndrome/SDH/borderline vitamin B12 deficiency/UTI Mental status somewhat improved but continues to wax and wane. LTM EEG x 48 hours negative for seizures. Neurology following.  Complicated UTI Completed IV Rocephin  course-unfortunately urine culture was never  sent.  Parkinson's disease Continue Sinemet   Recent pulmonary embolism On IV heparin  given SDH Once okay with neurosurgery-switch to DOAC.  BPH with acute urinary retention Failed voiding trial-Foley catheter reinserted 7/13 Outpatient follow-up with urology-will DC with Foley catheter.  Borderline vitamin B12 deficiency Being supplemented.  B6 deficiency Continue supplementation.  Class 1 Obesity: Estimated body mass index is 33.4 kg/m as calculated from the following:   Height as of this encounter: 6' 2 (1.88 m).   Weight as of this encounter: 118 kg.   Code status:   Code Status: Full Code   DVT Prophylaxis: Place TED hose Start: 03/26/24 0929 IV heparin    Family Communication: Sister-[name] updated 7/17   Disposition Plan: Status is: Inpatient Remains inpatient appropriate because: Severity of illness   Planned Discharge Destination:Skilled nursing facility   Diet: Diet Order             Diet regular Room service appropriate? Yes; Fluid consistency: Thin  Diet effective now                     Antimicrobial agents: Anti-infectives (From admission, onward)    Start     Dose/Rate Route Frequency Ordered Stop   03/24/24 2000  cefTRIAXone  (ROCEPHIN ) 1 g in sodium chloride  0.9 % 100 mL IVPB        1 g 200 mL/hr over 30 Minutes Intravenous Every 24 hours 03/24/24 1150 03/30/24 2226        MEDICATIONS: Scheduled Meds:  carbidopa -levodopa   1 tablet Oral 6 times per day   Chlorhexidine  Gluconate Cloth  6 each Topical Daily   cyanocobalamin   1,000 mcg Subcutaneous Q30 days   feeding supplement  237 mL Oral BID BM   oxyCODONE -acetaminophen   1 tablet Oral Once   pyridOXINE   50 mg Oral Daily   senna-docusate  1 tablet Oral BID   sodium chloride  flush  3 mL Intravenous Q12H   thiamine   100 mg Oral Daily   vitamin E   400 Units Oral Daily   zinc  sulfate (50mg  elemental zinc )  220 mg Oral Daily   Continuous Infusions:  heparin  1,500  Units/hr (04/05/24 0423)   PRN Meds:.acetaminophen  **OR** acetaminophen , albuterol , LORazepam , ondansetron  **OR** ondansetron  (ZOFRAN ) IV, sodium chloride  flush, traZODone    I have personally reviewed following labs and imaging studies  LABORATORY DATA: CBC: Recent Labs  Lab 03/30/24 1015 04/02/24 0200 04/03/24 0604 04/04/24 0725 04/05/24 0621  WBC 9.8 6.2 6.3 5.8 7.0  HGB 9.7* 9.1* 10.0* 9.4* 9.8*  HCT 31.0* 29.9* 32.4* 30.0* 31.9*  MCV 86.4 88.7 87.6 87.0 88.1  PLT 399 381 366 341 358    Basic Metabolic Panel: Recent Labs  Lab 03/30/24 1015 04/01/24 0502  NA 141  --   K 4.2  --   CL 107  --   CO2 21*  --   GLUCOSE 89  --   BUN 12  --   CREATININE 1.07  --   CALCIUM 9.0  --   MG 2.0  --   PHOS 1.5* 2.1*    GFR: Estimated Creatinine Clearance: 96.4 mL/min (by C-G formula based on SCr of 1.07 mg/dL).  Liver Function Tests: No results for input(s): AST, ALT, ALKPHOS, BILITOT, PROT, ALBUMIN in the last 168 hours. No results for input(s): LIPASE, AMYLASE in the last 168 hours. No results for input(s): AMMONIA in the last 168 hours.  Coagulation Profile: No results for input(s): INR,  PROTIME in the last 168 hours.  Cardiac Enzymes: No results for input(s): CKTOTAL, CKMB, CKMBINDEX, TROPONINI in the last 168 hours.  BNP (last 3 results) No results for input(s): PROBNP in the last 8760 hours.  Lipid Profile: No results for input(s): CHOL, HDL, LDLCALC, TRIG, CHOLHDL, LDLDIRECT in the last 72 hours.  Thyroid Function Tests: No results for input(s): TSH, T4TOTAL, FREET4, T3FREE, THYROIDAB in the last 72 hours.  Anemia Panel: No results for input(s): VITAMINB12, FOLATE, FERRITIN, TIBC, IRON, RETICCTPCT in the last 72 hours.  Urine analysis:    Component Value Date/Time   COLORURINE AMBER (A) 03/23/2024 1845   APPEARANCEUR HAZY (A) 03/23/2024 1845   LABSPEC 1.016 03/23/2024 1845   LABSPEC  1.010 02/10/2024 1448   PHURINE 6.0 03/23/2024 1845   GLUCOSEU NEGATIVE 03/23/2024 1845   HGBUR SMALL (A) 03/23/2024 1845   BILIRUBINUR NEGATIVE 03/23/2024 1845   BILIRUBINUR negative 02/10/2024 1448   KETONESUR 5 (A) 03/23/2024 1845   PROTEINUR NEGATIVE 03/23/2024 1845   NITRITE NEGATIVE 03/23/2024 1845   LEUKOCYTESUR NEGATIVE 03/23/2024 1845    Sepsis Labs: Lactic Acid, Venous    Component Value Date/Time   LATICACIDVEN 1.2 03/16/2024 2143    MICROBIOLOGY: No results found for this or any previous visit (from the past 240 hours).   RADIOLOGY STUDIES/RESULTS: CT HEAD WO CONTRAST ( ) Result Date: 04/05/2024 CLINICAL DATA:  63 year old male with bilateral subdural hematoma on MRI. Recent fall. EXAM: CT HEAD WITHOUT CONTRAST TECHNIQUE: Contiguous axial images were obtained from the base of the skull through the vertex without intravenous contrast. RADIATION DOSE REDUCTION: This exam was performed according to the departmental dose-optimization program which includes automated exposure control, adjustment of the mA and/or kV according to patient size and/or use of iterative reconstruction technique. COMPARISON:  Head CT and MRI 03/29/2024 and earlier. FINDINGS: Brain: Small right greater than left superior convexity subdural collections have not progressed since 03/29/2024, 1-2 mm on the left by CT, now 2-3 mm on the right (previously 5-6 mm on coronal CT). No midline shift. No new intracranial hemorrhage identified. Stable gray-white matter differentiation throughout the brain. No cortically based acute infarct identified. Normal basilar cisterns. 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 Vascular: No suspicious intracranial vascular hyperdensity. Calcified atherosclerosis at the skull base. Skull: Appear stable and intact. Sinuses/Orbits: Regressed paranasal sinus opacification, improved aeration. Tympanic cavities and mastoids well aerated. Other: No acute  orbit or scalp soft tissue finding. Right face zygoma subcutaneous hematoma or contusion redemonstrated. IMPRESSION: 1. Small right greater than left superior convexity Subdural Hematomas are stable to regressed since 03/29/2024, now up to 2-3 mm maximal thickness on the right side. No midline shift. 2. No new intracranial abnormality. 3. Right face subcutaneous hematoma or contusion redemonstrated. Electronically Signed   By: VEAR Hurst M.D.   On: 04/05/2024 06:01      LOS: 12 days   Donalda Applebaum, MD  [facility]    To contact the attending provider between 7A-7P or the covering provider during after hours 7P-7A, please log into the web site [email or web address] and access using universal  password for that web site. If you do not have the password, please call the hospital operator.  04/05/2024, 10:02 AM

## 2024-04-05 NOTE — Progress Notes (Signed)
 Speech Language Pathology Treatment: Cognitive-Linguistic  Patient Details Name: John [name] MRN: [id number] DOB: 1961/05/09 Today's Date: 04/05/2024 Time: 8582-8566 SLP Time Calculation (min) (ACUTE ONLY): 16 min  Assessment / Plan / Recommendation Clinical Impression  Pt alert, communicative, with improved speech clarity today. His sister, [name], was at the bedside.  He demonstrates improved coherence of thought and recall of events the last few days. He was able to recall strategies to improve speech intelligibility.  Sustained attention is functional, but he demonstrated break-down when required to selectively attend in presence of competing stimuli.  When prompted to maintain concentration, he demonstrated improvement for brief periods of time.  Discussed cognition and its fluctuating nature with his sister.  SLP will follow 1x/week for cognition.    HPI HPI: Pt is a 63 year old man admitted from [facility] with frequent falls and acute metabolic encephalopathy. CT head 7/14 stable subacute to chronic 6 mm right and chronic 4 mm left  subdural hematoma.  Pt was driving; mild dysarthria, mild gait changes but independent prior to series of falls. PMH: Parkinsons Disease, HYN, CVA, PE, anemia, recent B PEs, UTI.      SLP Plan  Continue with current plan of care          Recommendations                       Frequent or constant Supervision/Assistance Cognitive communication deficit (R41.841);Dysarthria and anarthria (R47.1)     Continue with current plan of care    Evalise Abruzzese L. Vona, MA CCC/SLP Clinical Specialist - Acute Care SLP Acute Rehabilitation Services Office number [phone]  Vona Palma Laurice  64/21/2025, 3:03 PM

## 2024-04-06 DIAGNOSIS — S0191XA Laceration without foreign body of unspecified part of head, initial encounter: Secondary | ICD-10-CM | POA: Diagnosis not present

## 2024-04-06 DIAGNOSIS — R296 Repeated falls: Secondary | ICD-10-CM | POA: Diagnosis not present

## 2024-04-06 DIAGNOSIS — N3000 Acute cystitis without hematuria: Secondary | ICD-10-CM | POA: Diagnosis not present

## 2024-04-06 DIAGNOSIS — G9341 Metabolic encephalopathy: Secondary | ICD-10-CM | POA: Diagnosis not present

## 2024-04-06 LAB — CBC
HCT: 30.8 % — ABNORMAL LOW (ref 39.0–52.0)
Hemoglobin: 9.4 g/dL — ABNORMAL LOW (ref 13.0–17.0)
MCH: 26.8 pg (ref 26.0–34.0)
MCHC: 30.5 g/dL (ref 30.0–36.0)
MCV: 87.7 fL (ref 80.0–100.0)
Platelets: 347 K/uL (ref 150–400)
RBC: 3.51 MIL/uL — ABNORMAL LOW (ref 4.22–5.81)
RDW: 15.8 % — ABNORMAL HIGH (ref 11.5–15.5)
WBC: 6.3 K/uL (ref 4.0–10.5)
nRBC: 0 % (ref 0.0–0.2)

## 2024-04-06 LAB — HEPARIN LEVEL (UNFRACTIONATED): Heparin Unfractionated: 0.44 [IU]/mL (ref 0.30–0.70)

## 2024-04-06 MED ORDER — MIDODRINE HCL 5 MG PO TABS
5.0000 mg | ORAL_TABLET | Freq: Three times a day (TID) | ORAL | Status: DC
  Administered 2024-04-06 – 2024-04-08 (×5): 5 mg via ORAL
  Filled 2024-04-06 (×6): qty 1

## 2024-04-06 MED ORDER — APIXABAN 5 MG PO TABS
5.0000 mg | ORAL_TABLET | Freq: Two times a day (BID) | ORAL | Status: DC
  Administered 2024-04-06 – 2024-04-16 (×21): 5 mg via ORAL
  Filled 2024-04-06 (×21): qty 1

## 2024-04-06 NOTE — Progress Notes (Signed)
 Occupational Therapy Treatment Patient Details Name: John [name] MRN: [id number] DOB: 1961/06/26 Today's Date: 04/06/2024   History of present illness Pt is a 64 year old man admitted from [facility] with frequent falls and AMS. Head CT negative. +facial lacerations. MRI shows cervical stenosis and small bilat SDH; neurosurgery following. PMH: Parkinsons Disease, HTN, CVA, PE, anemia, recent B PEs, UTI.   OT comments  Pt resting in bed, feels like he pulled his catheter out, appears to still be inserted, statlock not sticking to thigh, RN informed. Pt eager to perform OOB activities. Min/mod A to power sitting up to EOB, fair static sitting balance but when reaching or performing ADLs starts to lean back and to L side. Frequent cueing to maintain balance. Pt stands at bedside with min A from elevated surface, slowly starts to lean in all directions without cueing to maintain balance. Able to take side steps and small steps forward/backwards. Pt don/doffs socks with mod A sitting EOB, significant leaning when reaching feet. Pt set up with lunch tray, significant help to position in bed comfortably, set up for feeding at bed level. Will continue to see Pt acutely to progress as able, DC to postacute rehab <3hrs/day still appropriate.       If plan is discharge home, recommend the following:  Two people to help with walking and/or transfers;A lot of help with bathing/dressing/bathroom;Assistance with cooking/housework;Direct supervision/assist for medications management;Direct supervision/assist for financial management;Assist for transportation;Help with stairs or ramp for entrance   Equipment Recommendations  Wheelchair (measurements OT);Wheelchair cushion (measurements OT)    Recommendations for Other Services      Precautions / Restrictions Precautions Precautions: Fall Recall of Precautions/Restrictions: Impaired Precaution/Restrictions Comments: watch BP/ HR Restrictions Weight  Bearing Restrictions Per Provider Order: No       Mobility Bed Mobility Overal bed mobility: Needs Assistance Bed Mobility: Supine to Sit, Sit to Supine     Supine to sit: Min assist Sit to supine: Min assist   General bed mobility comments: min A in/out of bed, help with positoining once in bed    Transfers Overall transfer level: Needs assistance Equipment used: Rolling walker (2 wheels) Transfers: Sit to/from Stand, Bed to chair/wheelchair/BSC Sit to Stand: Min assist, +2 physical assistance, From elevated surface     Step pivot transfers: Contact guard assist, Min assist, +2 safety/equipment     General transfer comment: min A for STS from elevated surface, able to take steps, min A to maintain standing balance with cueing     Balance Overall balance assessment: Needs assistance Sitting-balance support: No upper extremity supported, Feet supported Sitting balance-Leahy Scale: Fair Sitting balance - Comments: sits statically EOB fair balance, once performing ADLs poor balance   Standing balance support: Bilateral upper extremity supported, During functional activity, Reliant on assistive device for balance Standing balance-Leahy Scale: Poor Standing balance comment: poor balance, cueing and min A to maintain balance                           ADL either performed or assessed with clinical judgement   ADL Overall ADL's : Needs assistance/impaired Eating/Feeding: Set up;Sitting                   Lower Body Dressing: Minimal assistance;Moderate assistance;Sitting/lateral leans;Sit to/from stand   Toilet Transfer: Minimal assistance;Rolling walker (2 wheels);BSC/3in1             General ADL Comments: Pt able to  don/doff  socks with min/mod A sitting EOB, mostly assist with sitting balance as Pt leans posteriorly or to L side. Pt stands with RW from elevated surface with min A, able to take small steps, cueing to maintain balance     Extremity/Trunk Assessment Upper Extremity Assessment Upper Extremity Assessment: Overall WFL for tasks assessed            Vision       Perception     Praxis     Communication Communication Communication: Impaired Factors Affecting Communication: Reduced clarity of speech   Cognition Arousal: Alert Behavior During Therapy: Flat affect Cognition: Cognition impaired             OT - Cognition Comments: Pt not aware of deficits or situation, able to follow commands, cueing to maintain attention                 Following commands: Impaired Following commands impaired: Follows one step commands with increased time      Cueing   Cueing Techniques: Verbal cues, Tactile cues  Exercises      Shoulder Instructions       General Comments      Pertinent Vitals/ Pain       Pain Assessment Pain Assessment: No/denies pain  Home Living                                          Prior Functioning/Environment              Frequency  Min 2X/week        Progress Toward Goals  OT Goals(current goals can now be found in the care plan section)  Progress towards OT goals: Progressing toward goals  Acute Rehab OT Goals Patient Stated Goal: to do more OOB activities OT Goal Formulation: With patient Time For Goal Achievement: 04/09/24 Potential to Achieve Goals: Fair ADL Goals Pt Will Perform Eating: Independently;sitting Pt Will Perform Grooming: with contact guard assist;standing Pt Will Perform Upper Body Bathing: with min assist;sitting Pt Will Perform Upper Body Dressing: with supervision;sitting Pt Will Perform Lower Body Dressing: with contact guard assist;sit to/from stand Pt Will Transfer to Toilet: with contact guard assist;ambulating;bedside commode Pt Will Perform Toileting - Clothing Manipulation and hygiene: with contact guard assist;sit to/from stand Pt Will Perform Tub/Shower Transfer: with supervision;tub bench;Tub  transfer;ambulating;rolling walker Additional ADL Goal #1: Pt will complete bed mobility modified independently in preparation for ADLs.  Plan      Co-evaluation                 AM-PAC OT 6 Clicks Daily Activity     Outcome Measure   Help from another person eating meals?: A Little Help from another person taking care of personal grooming?: A Little Help from another person toileting, which includes using toliet, bedpan, or urinal?: A Lot Help from another person bathing (including washing, rinsing, drying)?: A Lot Help from another person to put on and taking off regular upper body clothing?: A Little Help from another person to put on and taking off regular lower body clothing?: A Lot 6 Click Score: 15    End of Session Equipment Utilized During Treatment: Gait belt;Rolling walker (2 wheels)  OT Visit Diagnosis: Unsteadiness on feet (R26.81);Other abnormalities of gait and mobility (R26.89);Other symptoms and signs involving cognitive function;Muscle weakness (generalized) (M62.81);History of falling (Z91.81)   Activity Tolerance Patient tolerated  treatment well   Patient Left in bed;with call bell/phone within reach;with bed alarm set   Nurse Communication Mobility status        Time: 8764-8698 OT Time Calculation (min): 26 min  Charges: OT General Charges $OT Visit: 1 Visit OT Treatments $Self Care/Home Management : 23-37 mins  [address], OTR/L   Elouise JONELLE Bott 04/06/2024, 1:09 PM

## 2024-04-06 NOTE — Progress Notes (Signed)
 Patient ID: John [name], male   DOB: April 22, 1961, 63 y.o.   MRN: [id number] CT of the head reviewed.  Minimal residual subdural hematoma without mass effect or shift.  I would convert him to oral anticoagulants and plan on placement.  He can follow-up with me as an outpatient.  I would like to delay any treatment of the cervical spine, and let him get further out from his PE.  I have spoken to one of my partners about his case and he agrees.

## 2024-04-06 NOTE — Progress Notes (Signed)
 PROGRESS NOTE        PATIENT DETAILS Name: John [name] Age: 63 y.o. Sex: male Date of Birth: 1961-01-17 Admit Date: 03/24/2024 Admitting Physician [name], MD [name], [name], MD  Brief Summary: Patient is a 63 y.o.  male with history of Parkinson's disease, recent PE on anticoagulation-presented with recurrent falls-confusion.  Underwent multiple attempts for MRI imaging of brain/spine-but due to lack of patient cooperation-poor study due to significant motion artifact, subsequently underwent MRI under anesthesia-which showed SDH and severe C-spine stenosis.  Significant events: 7/9>> admit to [facility]  Significant studies: 7/8>> CXR: No PNA 7/8>> x-ray pelvis: No fracture.  7/8>> CT head: No acute intracranial abnormality 7/8>> CT maxillofacial: Redemonstrated fractures involving the right maxillary sinus/orbit-stable since 6/25.  No new fractures. 7/8>> CT C-spine: No fracture. 7/9>> x-ray left knee: No fracture. 7/9>> CT head: No acute intracranial abnormality. 7/9>> CT C-spine: No acute fracture. 7/10>> MRI C-spine/T-spine/L-spine: Motion artifact-limited study. 7/14>> MRI brain: SDH overlying bilateral cerebral convexities. 7/14>> MRI C-spine: Severe spinal canal stenosis 7/14>> MRI T-spine: No signal abnormality within the thoracic spinal cord. 7/14>> MRI L-spine: Acute fracture of the S3 sacral segment (demonstrated on prior CT 6/25).  Lumbar spondylosis. 7/14>> CT head: Stable subacute to chronic 6 mm right and chronic 4 mm left subdural hematoma. 7/15-7/16>> LTM EEG: No seizures. 7/16-7/17>> LTM EEG: No seizures. 7/21>> CT head: Small SDH-stable/regressed from prior study  Significant microbiology data: 7/9>> blood culture:  Procedures: None  Consults: Neurology Neurosurgery  Subjective: Pleasantly confused-but easily redirectable. No major issues overnight.  Objective: Vitals: Blood pressure 125/83, pulse 93,  temperature 97.8 F (36.6 C), temperature source Oral, resp. rate 16, height 6' 2 (1.88 m), weight 118 kg, SpO2 95%.   Exam: Not any distress Chest: Clear to auscultation CVs: S1-S2 regular Abdomen: Soft nontender nondistended Nonfocal exam.    Pertinent Labs/Radiology:    Latest Ref Rng & Units 04/06/2024    8:00 AM 04/05/2024    6:21 AM 04/04/2024    7:25 AM  CBC  WBC 4.0 - 10.5 K/uL 6.3  7.0  5.8   Hemoglobin 13.0 - 17.0 g/dL 9.4  9.8  9.4   Hematocrit 39.0 - 52.0 % 30.8  31.9  30.0   Platelets 150 - 400 K/uL 347  358  341     Lab Results  Component Value Date   NA 141 03/30/2024   K 4.2 03/30/2024   CL 107 03/30/2024   CO2 21 (L) 03/30/2024    Assessment/Plan: Frequent falls with right cheek hematoma, left forehead laceration (sutured 7/8 in the ED) right maxillary sinus and orbit fracture (present prior to this admit), S3 sacral segment fracture (present prior to this admit) Likely related to gait instability/autonomic dysfunction in the setting of Parkinson's disease-along with degenerative disc disease/C-spine stenosis. PT/OT eval SNF recommended Sutures removed 7/20  SDH Traumatic etiology Discussed with neurosurgeon-Dr. [name] 7/22-since repeat CT head on 7/21 with minimal residual SDH-tolerating IV heparin  for the past several days-okay to transition to Eliquis .  Cervical spinal stenosis Neurosurgery followed closely-exam remains stable Discussed with Dr. [name] 7/22-he prefers to have patient continue to rehab-and be several months out from most recent PE before considering decompression surgery. Follow-up with neurosurgery-Dr. [name] post discharge.  Acute metabolic encephalopathy Felt to be multifactorial-felt to be secondary to progressive Parkinson's disease related dementia-superimposed on potential postconcussive  syndrome/SDH/borderline vitamin B12 deficiency/UTI Mental status continues to slowly improve-does wax and wane at times.  Follows simple  commands-and redirected at times. LTM EEG x 48 hours negative for seizures. Neurology followed closely.  Complicated UTI Completed IV Rocephin  course-unfortunately urine culture was never sent.  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  Recent pulmonary embolism (March 10, 2024) See above regarding anticoagulation.  BPH with acute urinary retention Failed voiding trial-Foley catheter reinserted 7/13 Outpatient follow-up with urology-will d/c with Foley catheter.  Borderline vitamin B12 deficiency Being supplemented.  B6 deficiency Continue supplementation.  Class 1 Obesity: Estimated body mass index is 33.4 kg/m as calculated from the following:   Height as of this encounter: 6' 2 (1.88 m).   Weight as of this encounter: 118 kg.   Note-spoke with sister-[name] over the phone-she continues to have concerns about SNF that patient was from-given recurrent falls/readmissions.  Explained to her that patient is as medically stable as one could be-he will be at some risk of falls given issues with Parkinson's/autonomic dysfunction/ongoing confusion.  She unfortunately is not able to hire out-of-pocket private sitters, nor is she able to take the patient home with her.  She is not keen on patient going back to the prior SNF that he came from given recurrent falls.  She was of the opinion-the hospital system did not have any empathy and was very frustrated.  She thinks that going back to the facility will be a death sentence.  After extensive discussion-I explained to her that she will need to talk with our transition of care/social work department and go over discharge disposition options.    Code status:   Code Status: Full Code   DVT Prophylaxis: Place TED hose Start: 03/26/24 0929 apixaban  (ELIQUIS ) tablet 5 mg    Family Communication: Sister-[name] updated 7/22-see above   Disposition Plan: Status is: Inpatient Remains inpatient appropriate because: Severity of illness   Planned Discharge Destination:Skilled nursing facility   Diet: Diet Order             Diet regular Room service appropriate? Yes; Fluid consistency: Thin  Diet effective now                     Antimicrobial agents: Anti-infectives (From admission, onward)    Start     Dose/Rate Route Frequency Ordered Stop   03/24/24 2000  cefTRIAXone  (ROCEPHIN ) 1 g in sodium chloride  0.9 % 100 mL IVPB        1 g 200 mL/hr over 30 Minutes Intravenous Every 24 hours 03/24/24 1150 03/30/24 2226        MEDICATIONS: Scheduled Meds:  apixaban   5 mg Oral BID   carbidopa -levodopa   1 tablet Oral 6 times per day   Chlorhexidine  Gluconate Cloth  6 each Topical Daily   cyanocobalamin   1,000 mcg Subcutaneous Q30 days   feeding supplement  237 mL Oral BID BM   pyridOXINE   50 mg Oral Daily   senna-docusate  1 tablet Oral BID   sodium chloride  flush  3 mL Intravenous Q12H   thiamine   100 mg Oral Daily   vitamin E   400 Units Oral Daily   zinc  sulfate (50mg  elemental zinc )  220 mg Oral Daily   Continuous Infusions:   PRN Meds:.acetaminophen  **OR** acetaminophen , albuterol , LORazepam , ondansetron  **OR** ondansetron  (ZOFRAN ) IV, sodium chloride  flush, traZODone    I have personally  reviewed following labs and imaging studies  LABORATORY DATA: CBC: Recent Labs  Lab 04/02/24 0200 04/03/24 0604 04/04/24 0725 04/05/24 0621 04/06/24 0800  WBC 6.2 6.3 5.8 7.0 6.3  HGB 9.1* 10.0* 9.4* 9.8* 9.4*  HCT 29.9* 32.4* 30.0* 31.9* 30.8*  MCV 88.7 87.6 87.0 88.1 87.7  PLT 381 366 341 358 347    Basic Metabolic Panel: Recent Labs  Lab 04/01/24 0502  PHOS 2.1*    GFR: Estimated Creatinine Clearance: 96.4 mL/min (by C-G formula based on SCr of 1.07 mg/dL).  Liver Function Tests: No results for input(s): AST,  ALT, ALKPHOS, BILITOT, PROT, ALBUMIN in the last 168 hours. No results for input(s): LIPASE, AMYLASE in the last 168 hours. No results for input(s): AMMONIA in the last 168 hours.  Coagulation Profile: No results for input(s): INR, PROTIME in the last 168 hours.  Cardiac Enzymes: No results for input(s): CKTOTAL, CKMB, CKMBINDEX, TROPONINI in the last 168 hours.  BNP (last 3 results) No results for input(s): PROBNP in the last 8760 hours.  Lipid Profile: No results for input(s): CHOL, HDL, LDLCALC, TRIG, CHOLHDL, LDLDIRECT in the last 72 hours.  Thyroid Function Tests: No results for input(s): TSH, T4TOTAL, FREET4, T3FREE, THYROIDAB in the last 72 hours.  Anemia Panel: No results for input(s): VITAMINB12, FOLATE, FERRITIN, TIBC, IRON, RETICCTPCT in the last 72 hours.  Urine analysis:    Component Value Date/Time   COLORURINE AMBER (A) 03/23/2024 1845   APPEARANCEUR HAZY (A) 03/23/2024 1845   LABSPEC 1.016 03/23/2024 1845   LABSPEC 1.010 02/10/2024 1448   PHURINE 6.0 03/23/2024 1845   GLUCOSEU NEGATIVE 03/23/2024 1845   HGBUR SMALL (A) 03/23/2024 1845   BILIRUBINUR NEGATIVE 03/23/2024 1845   BILIRUBINUR negative 02/10/2024 1448   KETONESUR 5 (A) 03/23/2024 1845   PROTEINUR NEGATIVE 03/23/2024 1845   NITRITE NEGATIVE 03/23/2024 1845   LEUKOCYTESUR NEGATIVE 03/23/2024 1845    Sepsis Labs: Lactic Acid, Venous    Component Value Date/Time   LATICACIDVEN 1.2 03/16/2024 2143    MICROBIOLOGY: No results found for this or any previous visit (from the past 240 hours).   RADIOLOGY STUDIES/RESULTS: CT HEAD WO CONTRAST ( ) Result Date: 04/05/2024 CLINICAL DATA:  63 year old male with bilateral subdural hematoma on MRI. Recent fall. EXAM: CT HEAD WITHOUT CONTRAST TECHNIQUE: Contiguous axial images were obtained from the base of the skull through the vertex without intravenous contrast. RADIATION DOSE REDUCTION:  This exam was performed according to the departmental dose-optimization program which includes automated exposure control, adjustment of the mA and/or kV according to patient size and/or use of iterative reconstruction technique. COMPARISON:  Head CT and MRI 03/29/2024 and earlier. FINDINGS: Brain: Small right greater than left superior convexity subdural collections have not progressed since 03/29/2024, 1-2 mm on the left by CT, now 2-3 mm on the right (previously 5-6 mm on coronal CT). No midline shift. No new intracranial hemorrhage identified. Stable gray-white matter differentiation throughout the brain. No cortically based acute infarct identified. Normal basilar cisterns. 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 Vascular: No suspicious intracranial vascular hyperdensity. Calcified atherosclerosis at the skull base. Skull: Appear stable and intact. Sinuses/Orbits: Regressed paranasal sinus opacification, improved aeration. Tympanic cavities and mastoids well aerated. Other: No acute orbit or scalp soft tissue finding. Right face zygoma subcutaneous hematoma or contusion redemonstrated. IMPRESSION: 1. Small right greater than left superior convexity Subdural Hematomas are stable to regressed since 03/29/2024, now up to 2-3 mm maximal thickness on the right side. No midline shift.  2. No new intracranial abnormality. 3. Right face subcutaneous hematoma or contusion redemonstrated. Electronically Signed   By: VEAR Hurst M.D.   On: 04/05/2024 06:01      LOS: 13 days   Donalda Applebaum, MD  [facility]    To contact the attending provider between 7A-7P or the covering provider during after hours 7P-7A, please log into the web site [email or web address] and access using universal [place] password for that web site. If you do not have the password, please call the hospital operator.  04/06/2024, 11:02 AM

## 2024-04-06 NOTE — Plan of Care (Signed)

## 2024-04-06 NOTE — Plan of Care (Signed)
Problem: Health Behavior/Discharge Planning: Goal: Ability to manage health-related needs will improve Outcome: Progressing   Problem: Activity: Goal: Risk for activity intolerance will decrease Outcome: Progressing   Problem: Coping: Goal: Level of anxiety will decrease Outcome: Progressing   Problem: Safety: Goal: Ability to remain free from injury will improve Outcome: Progressing   Problem: Skin Integrity: Goal: Risk for impaired skin integrity will decrease Outcome: Progressing   

## 2024-04-06 NOTE — TOC Progression Note (Signed)
 Transition of Care [facility]) - Progression Note    Patient Details  Name: John [name] MRN: [id number] Date of Birth: Jun 22, 1961  Transition of [facility]) CM/SW Contact  [name], LCSW Phone Number: 04/06/2024, 3:59 PM  Clinical Narrative:    CSW and [facility] Supervisor, Erminio, spoke with patient's sister, [name]. Discussed medical stability to discharge per MD and plan to discharge back to [name]. She expressed concerns about needing a fall prevention plan at SNF. Erminio offered to contact [name] to discuss the plan and [name] agreed.  CSW spoke with [name] at [name] who is covering in admissions and she reported Erminio could speak with their Water quality scientist at the building tomorrow. Jena will work towards insurance process in the meantime. She was able to speak with [name] as well today.       Expected Discharge Plan: Skilled Nursing Facility Barriers to Discharge: Continued Medical Work up, English as a second language teacher, SNF Pending bed offer               Expected Discharge Plan and Services In-house Referral: Clinical Social Work   Post Acute Care Choice: Skilled Nursing Facility Living arrangements for the past 2 months: Single Family Home, Skilled Nursing Facility                                       Social Drivers of Health (SDOH) Interventions SDOH Screenings   Food Insecurity: Patient Unable To Answer (03/25/2024)  Housing: Unknown (03/25/2024)  Transportation Needs: Patient Unable To Answer (03/25/2024)  Utilities: Patient Unable To Answer (03/25/2024)  Depression (PHQ2-9): Low Risk  (12/10/2023)  Tobacco Use: Medium Risk (03/29/2024)    Readmission Risk Interventions     No data to display

## 2024-04-07 DIAGNOSIS — G9341 Metabolic encephalopathy: Secondary | ICD-10-CM | POA: Diagnosis not present

## 2024-04-07 DIAGNOSIS — N3000 Acute cystitis without hematuria: Secondary | ICD-10-CM | POA: Diagnosis not present

## 2024-04-07 DIAGNOSIS — R296 Repeated falls: Secondary | ICD-10-CM | POA: Diagnosis not present

## 2024-04-07 DIAGNOSIS — S0191XA Laceration without foreign body of unspecified part of head, initial encounter: Secondary | ICD-10-CM | POA: Diagnosis not present

## 2024-04-07 NOTE — Progress Notes (Signed)
 PROGRESS NOTE        PATIENT DETAILS Name: John [name] Age: 63 y.o. Sex: male Date of Birth: Nov 01, 1960 Admit Date: 03/24/2024 Admitting Physician [name], MD [name], [name], MD  Brief Summary: Patient is a 63 y.o.  male with history of Parkinson's disease, recent PE on anticoagulation-presented with recurrent falls-confusion.  Underwent multiple attempts for MRI imaging of brain/spine-but due to lack of patient cooperation-poor study due to significant motion artifact, subsequently underwent MRI under anesthesia-which showed SDH and severe C-spine stenosis.  Significant events: 7/9>> admit to [facility]  Significant studies: 7/8>> CXR: No PNA 7/8>> x-ray pelvis: No fracture.  7/8>> CT head: No acute intracranial abnormality 7/8>> CT maxillofacial: Redemonstrated fractures involving the right maxillary sinus/orbit-stable since 6/25.  No new fractures. 7/8>> CT C-spine: No fracture. 7/9>> x-ray left knee: No fracture. 7/9>> CT head: No acute intracranial abnormality. 7/9>> CT C-spine: No acute fracture. 7/10>> MRI C-spine/T-spine/L-spine: Motion artifact-limited study. 7/14>> MRI brain: SDH overlying bilateral cerebral convexities. 7/14>> MRI C-spine: Severe spinal canal stenosis 7/14>> MRI T-spine: No signal abnormality within the thoracic spinal cord. 7/14>> MRI L-spine: Acute fracture of the S3 sacral segment (demonstrated on prior CT 6/25).  Lumbar spondylosis. 7/14>> CT head: Stable subacute to chronic 6 mm right and chronic 4 mm left subdural hematoma. 7/15-7/16>> LTM EEG: No seizures. 7/16-7/17>> LTM EEG: No seizures. 7/21>> CT head: Small SDH-stable/regressed from prior study  Significant microbiology data: 7/9>> blood culture:  Procedures: None  Consults: Neurology Neurosurgery  Subjective: Somnolent but easily redirectable-answers of simple questions appropriately-moves all 4 extremities when  asked.  Objective: Vitals: Blood pressure 120/75, pulse 100, temperature 98 F (36.7 C), temperature source Oral, resp. rate 14, height 6' 2 (1.88 m), weight 118 kg, SpO2 95%.   Exam: Awake/alert. Chest: Clear to auscultation CVS: S1S2 Abdomen: Extremities no edema Nonfocal exam.   Pertinent Labs/Radiology:    Latest Ref Rng & Units 04/06/2024    8:00 AM 04/05/2024    6:21 AM 04/04/2024    7:25 AM  CBC  WBC 4.0 - 10.5 K/uL 6.3  7.0  5.8   Hemoglobin 13.0 - 17.0 g/dL 9.4  9.8  9.4   Hematocrit 39.0 - 52.0 % 30.8  31.9  30.0   Platelets 150 - 400 K/uL 347  358  341     Lab Results  Component Value Date   NA 141 03/30/2024   K 4.2 03/30/2024   CL 107 03/30/2024   CO2 21 (L) 03/30/2024    Assessment/Plan: Frequent falls with right cheek hematoma, left forehead laceration (sutured 7/8 in the ED) right maxillary sinus and orbit fracture (present prior to this admit), S3 sacral segment fracture (present prior to this admit) Likely related to gait instability/autonomic dysfunction in the setting of Parkinson's disease-along with degenerative disc disease/C-spine stenosis. PT/OT eval SNF recommended Sutures removed 7/20   SDH Traumatic etiology Discussed with neurosurgeon-Dr. [name] 7/22-since repeat CT head on 7/21 with minimal residual SDH-tolerating IV heparin  for the past several days-okay to transition to Eliquis .  Cervical spinal stenosis Neurosurgery followed closely-exam remains stable Discussed with Dr. [name] 7/22-he prefers to have patient continue to rehab-and be several months out from most recent PE before considering decompression surgery. Follow-up with neurosurgery-Dr. [name] post discharge.  Acute metabolic encephalopathy Felt to be multifactorial-felt to be secondary to progressive Parkinson's disease related dementia-superimposed on  potential postconcussive syndrome/SDH/borderline vitamin B12 deficiency/UTI Mental status continues to slowly improve-does wax  and wane at times.  Follows simple commands-and redirected at times. LTM EEG x 48 hours negative for seizures. Neurology followed closely.  Complicated UTI Completed IV Rocephin  course-unfortunately urine culture was never sent.  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  Recent pulmonary embolism (March 10, 2024) Switched from IV heparin  to Eliquis  on 7/22. Very febrile situation-at risk of falls-but has had recent PE. Plan is discharge to SNF with close supervision-see discussion below.  BPH with acute urinary retention Failed voiding trial-Foley catheter reinserted 7/13 Outpatient follow-up with urology-will d/c with Foley catheter.  Borderline vitamin B12 deficiency Being supplemented.  B6 deficiency Continue supplementation.  Disposition Family/sister resistant to patient going home back to the same SNF given recurrent falls.  See my note from 7/22. Discussed with social worker 7/23-TOC leadership will reach out to SNF leadership to see if we could get a fall plan in place-to minimize his risk falls. Will await further recommendations.  Class 1 Obesity: Estimated body mass index is 33.4 kg/m as calculated from the following:   Height as of this encounter: 6' 2 (1.88 m).   Weight as of this encounter: 118 kg.   Code status:   Code Status: Full Code   DVT Prophylaxis: Place TED hose Start: 03/26/24 0929 apixaban  (ELIQUIS ) tablet 5 mg   Family Communication: Sister-[name] updated 7/22   Disposition Plan: Status is: Inpatient Remains inpatient appropriate because: Severity of illness   Planned Discharge Destination:Skilled nursing facility   Diet: Diet Order             Diet regular Room service appropriate? Yes; Fluid consistency: Thin  Diet effective now                     Antimicrobial  agents: Anti-infectives (From admission, onward)    Start     Dose/Rate Route Frequency Ordered Stop   03/24/24 2000  cefTRIAXone  (ROCEPHIN ) 1 g in sodium chloride  0.9 % 100 mL IVPB        1 g 200 mL/hr over 30 Minutes Intravenous Every 24 hours 03/24/24 1150 03/30/24 2226        MEDICATIONS: Scheduled Meds:  apixaban   5 mg Oral BID   carbidopa -levodopa   1 tablet Oral 6 times per day   Chlorhexidine  Gluconate Cloth  6 each Topical Daily   cyanocobalamin   1,000 mcg Subcutaneous Q30 days   feeding supplement  237 mL Oral BID BM   midodrine   5 mg Oral TID WC   pyridOXINE   50 mg Oral Daily   senna-docusate  1 tablet Oral BID   sodium chloride  flush  3 mL Intravenous Q12H   thiamine   100 mg Oral Daily   vitamin E   400 Units Oral Daily   zinc  sulfate (50mg  elemental zinc )  220 mg Oral Daily   Continuous Infusions:   PRN Meds:.acetaminophen  **OR** acetaminophen , albuterol , LORazepam , ondansetron  **OR** ondansetron  (ZOFRAN ) IV, sodium chloride  flush, traZODone    I have personally reviewed following labs and imaging studies  LABORATORY DATA: CBC: Recent Labs  Lab 04/02/24 0200 04/03/24 0604 04/04/24 0725 04/05/24 0621 04/06/24 0800  WBC 6.2 6.3 5.8 7.0 6.3  HGB 9.1* 10.0* 9.4* 9.8* 9.4*  HCT 29.9* 32.4* 30.0* 31.9* 30.8*  MCV 88.7 87.6 87.0 88.1 87.7  PLT 381 366 341 358 347    Basic Metabolic Panel:  Recent Labs  Lab 04/01/24 0502  PHOS 2.1*    GFR: Estimated Creatinine Clearance: 96.4 mL/min (by C-G formula based on SCr of 1.07 mg/dL).  Liver Function Tests: No results for input(s): AST, ALT, ALKPHOS, BILITOT, PROT, ALBUMIN in the last 168 hours. No results for input(s): LIPASE, AMYLASE in the last 168 hours. No results for input(s): AMMONIA in the last 168 hours.  Coagulation Profile: No results for input(s): INR, PROTIME in the last 168 hours.  Cardiac Enzymes: No results for input(s): CKTOTAL, CKMB, CKMBINDEX, TROPONINI in  the last 168 hours.  BNP (last 3 results) No results for input(s): PROBNP in the last 8760 hours.  Lipid Profile: No results for input(s): CHOL, HDL, LDLCALC, TRIG, CHOLHDL, LDLDIRECT in the last 72 hours.  Thyroid Function Tests: No results for input(s): TSH, T4TOTAL, FREET4, T3FREE, THYROIDAB in the last 72 hours.  Anemia Panel: No results for input(s): VITAMINB12, FOLATE, FERRITIN, TIBC, IRON, RETICCTPCT in the last 72 hours.  Urine analysis:    Component Value Date/Time   COLORURINE AMBER (A) 03/23/2024 1845   APPEARANCEUR HAZY (A) 03/23/2024 1845   LABSPEC 1.016 03/23/2024 1845   LABSPEC 1.010 02/10/2024 1448   PHURINE 6.0 03/23/2024 1845   GLUCOSEU NEGATIVE 03/23/2024 1845   HGBUR SMALL (A) 03/23/2024 1845   BILIRUBINUR NEGATIVE 03/23/2024 1845   BILIRUBINUR negative 02/10/2024 1448   KETONESUR 5 (A) 03/23/2024 1845   PROTEINUR NEGATIVE 03/23/2024 1845   NITRITE NEGATIVE 03/23/2024 1845   LEUKOCYTESUR NEGATIVE 03/23/2024 1845    Sepsis Labs: Lactic Acid, Venous    Component Value Date/Time   LATICACIDVEN 1.2 03/16/2024 2143    MICROBIOLOGY: No results found for this or any previous visit (from the past 240 hours).   RADIOLOGY STUDIES/RESULTS: No results found.     LOS: 14 days   Donalda Applebaum, MD  [facility]    To contact the attending provider between 7A-7P or the covering provider during after hours 7P-7A, please log into the web site [email or web address] and access using universal [place] password for that web site. If you do not have the password, please call the hospital operator.  04/07/2024, 10:49 AM

## 2024-04-07 NOTE — Plan of Care (Signed)

## 2024-04-08 DIAGNOSIS — G9341 Metabolic encephalopathy: Secondary | ICD-10-CM | POA: Diagnosis not present

## 2024-04-08 DIAGNOSIS — N3000 Acute cystitis without hematuria: Secondary | ICD-10-CM | POA: Diagnosis not present

## 2024-04-08 DIAGNOSIS — S0191XA Laceration without foreign body of unspecified part of head, initial encounter: Secondary | ICD-10-CM | POA: Diagnosis not present

## 2024-04-08 DIAGNOSIS — R296 Repeated falls: Secondary | ICD-10-CM | POA: Diagnosis not present

## 2024-04-08 MED ORDER — MIDODRINE HCL 5 MG PO TABS: 7.5000 mg | ORAL_TABLET | Freq: Three times a day (TID) | ORAL | Status: DC

## 2024-04-08 NOTE — TOC Progression Note (Signed)
 Transition of Care [facility]) - Progression Note    Patient Details  Name: John [name] MRN: [id number] Date of Birth: July 25, 1961  Transition of Care [facility]) CM/SW Contact  [name], LCSW Phone Number: 04/08/2024, 4:05 PM  Clinical Narrative:    Per [facility] Supervisor, Erminio, she was able to speak with the Director at [facility] who was to call patient's sister to come up with a fall prevention plan.    Expected Discharge Plan: Skilled Nursing Facility Barriers to Discharge: Continued Medical Work up, English as a second language teacher, SNF Pending bed offer               Expected Discharge Plan and Services In-house Referral: Clinical Social Work   Post Acute Care Choice: Skilled Nursing Facility Living arrangements for the past 2 months: Single Family Home, Skilled Nursing Facility                                       Social Drivers of Health (SDOH) Interventions SDOH Screenings   Food Insecurity: Patient Unable To Answer (03/25/2024)  Housing: Unknown (03/25/2024)  Transportation Needs: Patient Unable To Answer (03/25/2024)  Utilities: Patient Unable To Answer (03/25/2024)  Depression (PHQ2-9): Low Risk  (12/10/2023)  Tobacco Use: Medium Risk (03/29/2024)    Readmission Risk Interventions     No data to display

## 2024-04-08 NOTE — Progress Notes (Signed)
 PROGRESS NOTE        PATIENT DETAILS Name: John [name] Age: 63 y.o. Sex: male Date of Birth: 1961/04/05 Admit Date: 03/24/2024 Admitting Physician [name], MD [name], [name], MD  Brief Summary: Patient is a 63 y.o.  male with history of Parkinson's disease, recent PE on anticoagulation-presented with recurrent falls-confusion.  Underwent multiple attempts for MRI imaging of brain/spine-but due to lack of patient cooperation-poor study due to significant motion artifact, subsequently underwent MRI under anesthesia-which showed SDH and severe C-spine stenosis.  Significant events: 7/9>> admit to [facility]  Significant studies: 7/8>> CXR: No PNA 7/8>> x-ray pelvis: No fracture.  7/8>> CT head: No acute intracranial abnormality 7/8>> CT maxillofacial: Redemonstrated fractures involving the right maxillary sinus/orbit-stable since 6/25.  No new fractures. 7/8>> CT C-spine: No fracture. 7/9>> x-ray left knee: No fracture. 7/9>> CT head: No acute intracranial abnormality. 7/9>> CT C-spine: No acute fracture. 7/10>> MRI C-spine/T-spine/L-spine: Motion artifact-limited study. 7/14>> MRI brain: SDH overlying bilateral cerebral convexities. 7/14>> MRI C-spine: Severe spinal canal stenosis 7/14>> MRI T-spine: No signal abnormality within the thoracic spinal cord. 7/14>> MRI L-spine: Acute fracture of the S3 sacral segment (demonstrated on prior CT 6/25).  Lumbar spondylosis. 7/14>> CT head: Stable subacute to chronic 6 mm right and chronic 4 mm left subdural hematoma. 7/15-7/16>> LTM EEG: No seizures. 7/16-7/17>> LTM EEG: No seizures. 7/21>> CT head: Small SDH-stable/regressed from prior study  Significant microbiology data: 7/9>> blood culture:  Procedures: None  Consults: Neurology Neurosurgery  Subjective: Unchanged-minimally/pleasantly confused-easily directable-answers most of my questions appropriately-needs some redirection at times.     Objective: Vitals: Blood pressure 133/85, pulse 100, temperature 99.2 F (37.3 C), temperature source Oral, resp. rate 16, height 6' 2 (1.88 m), weight 118 kg, SpO2 95%.   Exam: Awake/alert Not in any distress Nonfocal exam.   Pertinent Labs/Radiology:    Latest Ref Rng & Units 04/06/2024    8:00 AM 04/05/2024    6:21 AM 04/04/2024    7:25 AM  CBC  WBC 4.0 - 10.5 K/uL 6.3  7.0  5.8   Hemoglobin 13.0 - 17.0 g/dL 9.4  9.8  9.4   Hematocrit 39.0 - 52.0 % 30.8  31.9  30.0   Platelets 150 - 400 K/uL 347  358  341     Lab Results  Component Value Date   NA 141 03/30/2024   K 4.2 03/30/2024   CL 107 03/30/2024   CO2 21 (L) 03/30/2024    Assessment/Plan: Frequent falls with right cheek hematoma, left forehead laceration (sutured 7/8 in the ED) right maxillary sinus and orbit fracture (present prior to this admit), S3 sacral segment fracture (present prior to this admit) Likely related to gait instability/autonomic dysfunction in the setting of Parkinson's disease-along with degenerative disc disease/C-spine stenosis. PT/OT eval SNF recommended Sutures removed 7/20  SDH Traumatic etiology Discussed with neurosurgeon-Dr. [name] 7/22-since repeat CT head on 7/21 with minimal residual SDH-tolerated IV heparin  for several days-has been subsequently transitioned to Eliquis   Cervical spinal stenosis Neurosurgery followed closely-exam remains stable Discussed with Dr. [name] 7/22-he prefers to have patient continue to rehab-and be several months out from most recent PE before considering decompression surgery. Follow-up with neurosurgery-Dr. [name] post discharge.  Acute metabolic encephalopathy Felt to be multifactorial-felt to be secondary to progressive Parkinson's disease related dementia-superimposed on potential postconcussive syndrome/SDH/borderline vitamin B12 deficiency/UTI Mental  status continues to slowly improve-does wax and wane at times.  Follows simple commands-and  redirected at times. LTM EEG x 48 hours negative for seizures. Neurology followed closely.  Complicated UTI Completed IV Rocephin  course-unfortunately urine culture was never sent.  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  Recent pulmonary embolism (March 10, 2024) Switched from IV heparin  to Eliquis  on 7/22. Very febrile situation-at risk of falls-but has had recent PE. Plan is discharge to SNF with close supervision-see discussion below.  BPH with acute urinary retention Failed voiding trial-Foley catheter reinserted 7/13 Outpatient follow-up with urology-will d/c with Foley catheter.  Borderline vitamin B12 deficiency Being supplemented.  B6 deficiency Continue supplementation.  Disposition Family/sister resistant to patient going home back to the same SNF given recurrent falls.  See my note from 7/22. Per social worker-TOC leadership is reaching out to SNF to see if he can get a fall plan in place.   Class 1 Obesity: Estimated body mass index is 33.4 kg/m as calculated from the following:   Height as of this encounter: 6' 2 (1.88 m).   Weight as of this encounter: 118 kg.   Code status:   Code Status: Full Code   DVT Prophylaxis: Place TED hose Start: 03/26/24 0929 apixaban  (ELIQUIS ) tablet 5 mg   Family Communication: Sister-[name] updated 7/22   Disposition Plan: Status is: Inpatient Remains inpatient appropriate because: Severity of illness   Planned Discharge Destination:Skilled nursing facility   Diet: Diet Order             Diet regular Room service appropriate? Yes; Fluid consistency: Thin  Diet effective now                     Antimicrobial agents: Anti-infectives (From admission, onward)    Start     Dose/Rate Route Frequency Ordered Stop   03/24/24 2000  cefTRIAXone  (ROCEPHIN ) 1 g in  sodium chloride  0.9 % 100 mL IVPB        1 g 200 mL/hr over 30 Minutes Intravenous Every 24 hours 03/24/24 1150 03/30/24 2226        MEDICATIONS: Scheduled Meds:  apixaban   5 mg Oral BID   carbidopa -levodopa   1 tablet Oral 6 times per day   Chlorhexidine  Gluconate Cloth  6 each Topical Daily   cyanocobalamin   1,000 mcg Subcutaneous Q30 days   feeding supplement  237 mL Oral BID BM   midodrine   5 mg Oral TID WC   pyridOXINE   50 mg Oral Daily   senna-docusate  1 tablet Oral BID   sodium chloride  flush  3 mL Intravenous Q12H   thiamine   100 mg Oral Daily   vitamin E   400 Units Oral Daily   zinc  sulfate (50mg  elemental zinc )  220 mg Oral Daily   Continuous Infusions:   PRN Meds:.acetaminophen  **OR** acetaminophen , albuterol , LORazepam , ondansetron  **OR** ondansetron  (ZOFRAN ) IV, sodium chloride  flush, traZODone    I have personally reviewed following labs and imaging studies  LABORATORY DATA: CBC: Recent Labs  Lab 04/02/24 0200 04/03/24 0604 04/04/24 0725 04/05/24 0621 04/06/24 0800  WBC 6.2 6.3 5.8 7.0 6.3  HGB 9.1* 10.0* 9.4* 9.8* 9.4*  HCT 29.9* 32.4* 30.0* 31.9* 30.8*  MCV 88.7 87.6 87.0 88.1 87.7  PLT 381 366 341 358 347    Basic Metabolic Panel: No results for input(s): NA, K, CL, CO2, GLUCOSE, BUN, CREATININE, CALCIUM, MG, PHOS in the last  168 hours.   GFR: Estimated Creatinine Clearance: 96.4 mL/min (by C-G formula based on SCr of 1.07 mg/dL).  Liver Function Tests: No results for input(s): AST, ALT, ALKPHOS, BILITOT, PROT, ALBUMIN in the last 168 hours. No results for input(s): LIPASE, AMYLASE in the last 168 hours. No results for input(s): AMMONIA in the last 168 hours.  Coagulation Profile: No results for input(s): INR, PROTIME in the last 168 hours.  Cardiac Enzymes: No results for input(s): CKTOTAL, CKMB, CKMBINDEX, TROPONINI in the last 168 hours.  BNP (last 3 results) No results for input(s):  PROBNP in the last 8760 hours.  Lipid Profile: No results for input(s): CHOL, HDL, LDLCALC, TRIG, CHOLHDL, LDLDIRECT in the last 72 hours.  Thyroid Function Tests: No results for input(s): TSH, T4TOTAL, FREET4, T3FREE, THYROIDAB in the last 72 hours.  Anemia Panel: No results for input(s): VITAMINB12, FOLATE, FERRITIN, TIBC, IRON, RETICCTPCT in the last 72 hours.  Urine analysis:    Component Value Date/Time   COLORURINE AMBER (A) 03/23/2024 1845   APPEARANCEUR HAZY (A) 03/23/2024 1845   LABSPEC 1.016 03/23/2024 1845   LABSPEC 1.010 02/10/2024 1448   PHURINE 6.0 03/23/2024 1845   GLUCOSEU NEGATIVE 03/23/2024 1845   HGBUR SMALL (A) 03/23/2024 1845   BILIRUBINUR NEGATIVE 03/23/2024 1845   BILIRUBINUR negative 02/10/2024 1448   KETONESUR 5 (A) 03/23/2024 1845   PROTEINUR NEGATIVE 03/23/2024 1845   NITRITE NEGATIVE 03/23/2024 1845   LEUKOCYTESUR NEGATIVE 03/23/2024 1845    Sepsis Labs: Lactic Acid, Venous    Component Value Date/Time   LATICACIDVEN 1.2 03/16/2024 2143    MICROBIOLOGY: No results found for this or any previous visit (from the past 240 hours).   RADIOLOGY STUDIES/RESULTS: No results found.     LOS: 15 days   Donalda Applebaum, MD  [facility]    To contact the attending provider between 7A-7P or the covering provider during after hours 7P-7A, please log into the web site [email or web address] and access using universal [place] password for that web site. If you do not have the password, please call the hospital operator.  04/08/2024, 11:35 AM

## 2024-04-08 NOTE — Plan of Care (Signed)

## 2024-04-08 NOTE — Progress Notes (Signed)
 Physical Therapy Treatment Patient Details Name: John [name] MRN: [id number] DOB: 1961-04-24 Today's Date: 04/08/2024   History of Present Illness Pt is a 63 year old man admitted from [facility] SNF with frequent falls and AMS. Head CT negative. +facial lacerations. MRI shows cervical stenosis and small bilat SDH; neurosurgery following. PMH: Parkinsons Disease, HTN, CVA, PE, anemia, recent B PEs, UTI.    PT Comments  Pt with similar presentation to previous session. Pt once again limited by orthostatics. BP: 129/82 supine, BP: 103/71 seated, BP: 72/61 standing, BP: 135/89 supine. Pt able to stand and take sidesteps with +2 Min A. No change in DC/DME recs at this time. PT will continue to follow.     If plan is discharge home, recommend the following: A lot of help with bathing/dressing/bathroom;Assist for transportation;Help with stairs or ramp for entrance;Supervision due to cognitive status;A lot of help with walking and/or transfers   Can travel by private vehicle     No  Equipment Recommendations  None recommended by PT    Recommendations for Other Services       Precautions / Restrictions Precautions Precautions: Fall Recall of Precautions/Restrictions: Impaired Precaution/Restrictions Comments: watch BP/ HR Restrictions Weight Bearing Restrictions Per Provider Order: No     Mobility  Bed Mobility Overal bed mobility: Needs Assistance Bed Mobility: Supine to Sit, Sit to Supine     Supine to sit: Mod assist Sit to supine: Contact guard assist, Min assist   General bed mobility comments: Mod A for trunk elevation. CGA/Min A to return and repostion in bed.    Transfers Overall transfer level: Needs assistance Equipment used: Rolling walker (2 wheels) Transfers: Sit to/from Stand Sit to Stand: Min assist, +2 physical assistance, From elevated surface           General transfer comment: min A for STS from elevated surface, able to take steps, min A to  maintain standing balance with cueing    Ambulation/Gait               General Gait Details: Ambulation deferred due to orthostatics   Stairs             Wheelchair Mobility     Tilt Bed    Modified Rankin (Stroke Patients Only)       Balance Overall balance assessment: Needs assistance Sitting-balance support: No upper extremity supported, Feet supported Sitting balance-Leahy Scale: Fair Sitting balance - Comments: sits statically EOB fair balance, once performing ADLs poor balance   Standing balance support: Bilateral upper extremity supported, During functional activity, Reliant on assistive device for balance Standing balance-Leahy Scale: Poor Standing balance comment: poor balance, cueing and min A to maintain balance                            Communication Communication Communication: Impaired Factors Affecting Communication: Reduced clarity of speech  Cognition Arousal: Alert Behavior During Therapy: Flat affect   PT - Cognitive impairments: Orientation, Awareness, Memory, Attention, Safety/Judgement, Problem solving   Orientation impairments: Situation, Place, Time                   PT - Cognition Comments: Poor safety awareness and awareness of deficits with pt stating he could go home tomorrow and ambulate with his quad cane Following commands: Impaired Following commands impaired: Follows one step commands with increased time    Cueing Cueing Techniques: Verbal cues, Tactile cues  Exercises  General Comments General comments (skin integrity, edema, etc.): BP: 129/82 supine, BP: 103/71 seated, BP: 72/61 standing, BP: 135/89 supine      Pertinent Vitals/Pain Pain Assessment Pain Assessment: No/denies pain    Home Living                          Prior Function            PT Goals (current goals can now be found in the care plan section) Progress towards PT goals: Progressing toward goals     Frequency    Min 2X/week      PT Plan      Co-evaluation              AM-PAC PT 6 Clicks Mobility   Outcome Measure  Help needed turning from your back to your side while in a flat bed without using bedrails?: A Little Help needed moving from lying on your back to sitting on the side of a flat bed without using bedrails?: A Little Help needed moving to and from a bed to a chair (including a wheelchair)?: A Lot Help needed standing up from a chair using your arms (e.g., wheelchair or bedside chair)?: A Lot Help needed to walk in hospital room?: Total Help needed climbing 3-5 steps with a railing? : Total 6 Click Score: 12    End of Session Equipment Utilized During Treatment: Gait belt Activity Tolerance: Patient tolerated treatment well Patient left: in bed;with call bell/phone within reach;with bed alarm set Nurse Communication: Mobility status PT Visit Diagnosis: Unsteadiness on feet (R26.81);Other abnormalities of gait and mobility (R26.89);Other symptoms and signs involving the nervous system (R29.898)     Time: 8957-8897 PT Time Calculation (min) (ACUTE ONLY): 20 min  Charges:    $Therapeutic Activity: 8-22 mins PT General Charges $$ ACUTE PT VISIT: 1 Visit                     Sueellen NOVAK, PT, DPT Acute Rehab Services [phone]    Aundrey Elahi 04/08/2024, 2:09 PM

## 2024-04-09 DIAGNOSIS — G9341 Metabolic encephalopathy: Secondary | ICD-10-CM | POA: Diagnosis not present

## 2024-04-09 DIAGNOSIS — N3000 Acute cystitis without hematuria: Secondary | ICD-10-CM | POA: Diagnosis not present

## 2024-04-09 DIAGNOSIS — R296 Repeated falls: Secondary | ICD-10-CM | POA: Diagnosis not present

## 2024-04-09 DIAGNOSIS — S0191XA Laceration without foreign body of unspecified part of head, initial encounter: Secondary | ICD-10-CM | POA: Diagnosis not present

## 2024-04-09 MED ORDER — MIDODRINE HCL 5 MG PO TABS
10.0000 mg | ORAL_TABLET | Freq: Three times a day (TID) | ORAL | Status: DC
  Administered 2024-04-09 – 2024-04-13 (×9): 10 mg via ORAL
  Filled 2024-04-09 (×10): qty 2

## 2024-04-09 NOTE — Progress Notes (Signed)
 Speech Language Pathology Treatment: Cognitive-Linguistic  Patient Details Name: John [name] MRN: [id number] DOB: 1961/02/27 Today's Date: 04/09/2024 Time: 9041-8964 SLP Time Calculation (min) (ACUTE ONLY): 37 min  Assessment / Plan / Recommendation Clinical Impression  [name] shows improved short-term recall and improved working memory. After review of basic circumstances/dates surrounding his admission, he was able to recall 4-5 facts five, ten, and 20 minutes after initial presentation. His awareness continues to fluctuate. Requires cues to identify when speech is more dysarthric and self-correct. Able to improve clarity/diction when given intermittent prompts.  Recommend continued tx to address dysarthria and complex memory.    HPI HPI: Pt is a 63 year old man admitted from [facility] with frequent falls and acute metabolic encephalopathy. CT head 7/14 stable subacute to chronic 6 mm right and chronic 4 mm left  subdural hematoma.  Pt was driving; mild dysarthria, mild gait changes but independent prior to series of falls. PMH: Parkinsons Disease, HYN, CVA, PE, anemia, recent B PEs, UTI.      SLP Plan  Continue with current plan of care          Recommendations                         Frequent or constant Supervision/Assistance Cognitive communication deficit (R41.841);Dysarthria and anarthria (R47.1)     Continue with current plan of care     John [name] John [name] John [name], John [name] CCC/SLP Clinical Specialist - Acute Care SLP Acute Rehabilitation Services Office number [phone]  04/09/2024, 10:39 AM

## 2024-04-09 NOTE — Plan of Care (Signed)

## 2024-04-09 NOTE — Progress Notes (Signed)
 PROGRESS NOTE        PATIENT DETAILS Name: John [name] Age: 63 y.o. Sex: male Date of Birth: 28-Jan-1961 Admit Date: 03/24/2024 Admitting Physician [name], MD [name], [name], MD  Brief Summary: Patient is a 63 y.o.  male with history of Parkinson's disease, recent PE on anticoagulation-presented with recurrent falls-confusion.  Underwent multiple attempts for MRI imaging of brain/spine-but due to lack of patient cooperation-poor study due to significant motion artifact, subsequently underwent MRI under anesthesia-which showed SDH and severe C-spine stenosis.  Significant events: 7/9>> admit to [facility]  Significant studies: 7/8>> CXR: No PNA 7/8>> x-ray pelvis: No fracture.  7/8>> CT head: No acute intracranial abnormality 7/8>> CT maxillofacial: Redemonstrated fractures involving the right maxillary sinus/orbit-stable since 6/25.  No new fractures. 7/8>> CT C-spine: No fracture. 7/9>> x-ray left knee: No fracture. 7/9>> CT head: No acute intracranial abnormality. 7/9>> CT C-spine: No acute fracture. 7/10>> MRI C-spine/T-spine/L-spine: Motion artifact-limited study. 7/14>> MRI brain: SDH overlying bilateral cerebral convexities. 7/14>> MRI C-spine: Severe spinal canal stenosis 7/14>> MRI T-spine: No signal abnormality within the thoracic spinal cord. 7/14>> MRI L-spine: Acute fracture of the S3 sacral segment (demonstrated on prior CT 6/25).  Lumbar spondylosis. 7/14>> CT head: Stable subacute to chronic 6 mm right and chronic 4 mm left subdural hematoma. 7/15-7/16>> LTM EEG: No seizures. 7/16-7/17>> LTM EEG: No seizures. 7/21>> CT head: Small SDH-stable/regressed from prior study  Significant microbiology data: 7/9>> blood culture:  Procedures: None  Consults: Neurology Neurosurgery  Subjective: No major issues overnight-still orthostatic with physical therapy yesterday.  Relatively awake/alert-acknowledges that he has been falling  quite a bit-does not think he gets dizzy for too long when he stands up.  Objective: Vitals: Blood pressure 123/84, pulse 97, temperature 98.5 F (36.9 C), temperature source Oral, resp. rate 15, height 6' 2 (1.88 m), weight 118 kg, SpO2 96%.   Exam: Awake/alert Nonfocal exam.   Pertinent Labs/Radiology:    Latest Ref Rng & Units 04/06/2024    8:00 AM 04/05/2024    6:21 AM 04/04/2024    7:25 AM  CBC  WBC 4.0 - 10.5 K/uL 6.3  7.0  5.8   Hemoglobin 13.0 - 17.0 g/dL 9.4  9.8  9.4   Hematocrit 39.0 - 52.0 % 30.8  31.9  30.0   Platelets 150 - 400 K/uL 347  358  341     Lab Results  Component Value Date   NA 141 03/30/2024   K 4.2 03/30/2024   CL 107 03/30/2024   CO2 21 (L) 03/30/2024    Assessment/Plan: Frequent falls with right cheek hematoma, left forehead laceration (sutured 7/8 in the ED) right maxillary sinus and orbit fracture (present prior to this admit), S3 sacral segment fracture (present prior to this admit) Likely related to gait instability/autonomic dysfunction in the setting of Parkinson's disease-along with degenerative disc disease/C-spine stenosis. PT/OT eval SNF recommended Sutures removed 7/20  SDH Traumatic etiology Discussed with neurosurgeon-Dr. [name] 7/22-since repeat CT head on 7/21 with minimal residual SDH-tolerated IV heparin  for several days-has been subsequently transitioned to Eliquis   Cervical spinal stenosis Neurosurgery followed closely-exam remains stable Discussed with Dr. [name] 7/22-he prefers to have patient continue to rehab-and be several months out from most recent PE before considering decompression surgery. Follow-up with neurosurgery-Dr. [name] post discharge.  Acute metabolic encephalopathy Felt to be multifactorial-felt to be secondary  to progressive Parkinson's disease related dementia-superimposed on potential postconcussive syndrome/SDH/borderline vitamin B12 deficiency/UTI Mental status continues to slowly improve-does wax  and wane at times.  Follows simple commands-and redirected at times. LTM EEG x 48 hours negative for seizures. Neurology followed closely.  Complicated UTI Completed IV Rocephin  course-unfortunately urine culture was never sent.  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  Recent pulmonary embolism (March 10, 2024) Switched from IV heparin  to Eliquis  on 7/22. Very febrile situation-at risk of falls-but has had recent PE. Plan is discharge to SNF with close supervision-see discussion below.  BPH with acute urinary retention Failed voiding trial-Foley catheter reinserted 7/13 Outpatient follow-up with urology-will d/c with Foley catheter.  Borderline vitamin B12 deficiency Being supplemented.  B6 deficiency Continue supplementation.  Disposition Family/sister resistant to patient going home back to the same SNF given recurrent falls.  See my note from 7/22. Per social worker-TOC leadership is reaching out to SNF to see if he can get a fall plan in place.   Class 1 Obesity: Estimated body mass index is 33.4 kg/m as calculated from the following:   Height as of this encounter: 6' 2 (1.88 m).   Weight as of this encounter: 118 kg.   Code status:   Code Status: Full Code   DVT Prophylaxis: Place TED hose Start: 03/26/24 0929 apixaban  (ELIQUIS ) tablet 5 mg   Family Communication: Sister-[name]-[phone] updated 7/22   Disposition Plan: Status is: Inpatient Remains inpatient appropriate because: Severity of illness   Planned Discharge Destination:Skilled nursing facility   Diet: Diet Order             Diet regular Room service appropriate? Yes; Fluid consistency: Thin  Diet effective now                      Antimicrobial agents: Anti-infectives (From admission, onward)    Start     Dose/Rate Route Frequency Ordered Stop   03/24/24 2000  cefTRIAXone  (ROCEPHIN ) 1 g in sodium chloride  0.9 % 100 mL IVPB        1 g 200 mL/hr over 30 Minutes Intravenous Every 24 hours 03/24/24 1150 03/30/24 2226        MEDICATIONS: Scheduled Meds:  apixaban   5 mg Oral BID   carbidopa -levodopa   1 tablet Oral 6 times per day   Chlorhexidine  Gluconate Cloth  6 each Topical Daily   cyanocobalamin   1,000 mcg Subcutaneous Q30 days   feeding supplement  237 mL Oral BID BM   midodrine   10 mg Oral TID WC   pyridOXINE   50 mg Oral Daily   senna-docusate  1 tablet Oral BID   sodium chloride  flush  3 mL Intravenous Q12H   thiamine   100 mg Oral Daily   vitamin E   400 Units Oral Daily   zinc  sulfate (50mg  elemental zinc )  220 mg Oral Daily   Continuous Infusions:   PRN Meds:.acetaminophen  **OR** acetaminophen , albuterol , LORazepam , ondansetron  **OR** ondansetron  (ZOFRAN ) IV, sodium chloride  flush, traZODone    I have personally reviewed following labs and imaging studies  LABORATORY DATA: CBC: Recent Labs  Lab 04/03/24 0604 04/04/24 0725 04/05/24 0621 04/06/24 0800  WBC 6.3 5.8 7.0 6.3  HGB 10.0* 9.4* 9.8* 9.4*  HCT 32.4* 30.0* 31.9* 30.8*  MCV 87.6 87.0 88.1 87.7  PLT  366 341 358 347    Basic Metabolic Panel: No results for input(s): NA, K, CL, CO2, GLUCOSE, BUN, CREATININE, CALCIUM, MG, PHOS in the last 168 hours.   GFR: Estimated Creatinine Clearance: 96.4 mL/min (by C-G formula based on SCr of 1.07 mg/dL).  Liver Function Tests: No results for input(s): AST, ALT, ALKPHOS, BILITOT, PROT, ALBUMIN in the last 168 hours. No results for input(s): LIPASE, AMYLASE in the last 168 hours. No results for input(s): AMMONIA in the last 168 hours.  Coagulation Profile: No results for input(s): INR, PROTIME in the last 168 hours.  Cardiac  Enzymes: No results for input(s): CKTOTAL, CKMB, CKMBINDEX, TROPONINI in the last 168 hours.  BNP (last 3 results) No results for input(s): PROBNP in the last 8760 hours.  Lipid Profile: No results for input(s): CHOL, HDL, LDLCALC, TRIG, CHOLHDL, LDLDIRECT in the last 72 hours.  Thyroid Function Tests: No results for input(s): TSH, T4TOTAL, FREET4, T3FREE, THYROIDAB in the last 72 hours.  Anemia Panel: No results for input(s): VITAMINB12, FOLATE, FERRITIN, TIBC, IRON, RETICCTPCT in the last 72 hours.  Urine analysis:    Component Value Date/Time   COLORURINE AMBER (A) 03/23/2024 1845   APPEARANCEUR HAZY (A) 03/23/2024 1845   LABSPEC 1.016 03/23/2024 1845   LABSPEC 1.010 02/10/2024 1448   PHURINE 6.0 03/23/2024 1845   GLUCOSEU NEGATIVE 03/23/2024 1845   HGBUR SMALL (A) 03/23/2024 1845   BILIRUBINUR NEGATIVE 03/23/2024 1845   BILIRUBINUR negative 02/10/2024 1448   KETONESUR 5 (A) 03/23/2024 1845   PROTEINUR NEGATIVE 03/23/2024 1845   NITRITE NEGATIVE 03/23/2024 1845   LEUKOCYTESUR NEGATIVE 03/23/2024 1845    Sepsis Labs: Lactic Acid, Venous    Component Value Date/Time   LATICACIDVEN 1.2 03/16/2024 2143    MICROBIOLOGY: No results found for this or any previous visit (from the past 240 hours).   RADIOLOGY STUDIES/RESULTS: No results found.     LOS: 16 days   Donalda Applebaum, MD  [facility]    To contact the attending provider between 7A-7P or the covering provider during after hours 7P-7A, please log into the web site [email or web address] and access using universal [place] password for that web site. If you do not have the password, please call the hospital operator.  04/09/2024, 11:07 AM

## 2024-04-09 NOTE — Care Plan (Addendum)
 Sister [name] called and was updated John [name], Charity fundraiser

## 2024-04-10 DIAGNOSIS — R296 Repeated falls: Secondary | ICD-10-CM | POA: Diagnosis not present

## 2024-04-10 DIAGNOSIS — N3000 Acute cystitis without hematuria: Secondary | ICD-10-CM | POA: Diagnosis not present

## 2024-04-10 DIAGNOSIS — G9341 Metabolic encephalopathy: Secondary | ICD-10-CM | POA: Diagnosis not present

## 2024-04-10 DIAGNOSIS — S0191XA Laceration without foreign body of unspecified part of head, initial encounter: Secondary | ICD-10-CM | POA: Diagnosis not present

## 2024-04-10 MED ORDER — FLUDROCORTISONE ACETATE 0.1 MG PO TABS
0.1000 mg | ORAL_TABLET | Freq: Every day | ORAL | Status: DC
  Administered 2024-04-10 – 2024-04-15 (×6): 0.1 mg via ORAL
  Filled 2024-04-10 (×7): qty 1

## 2024-04-10 MED ORDER — BISACODYL 10 MG RE SUPP: 10.0000 mg | Freq: Every day | RECTAL | Status: DC | PRN

## 2024-04-10 MED ORDER — POLYETHYLENE GLYCOL 3350 17 G PO PACK
17.0000 g | PACK | Freq: Every day | ORAL | Status: DC
  Administered 2024-04-12 – 2024-04-16 (×5): 17 g via ORAL
  Filled 2024-04-10 (×5): qty 1

## 2024-04-10 NOTE — Progress Notes (Signed)
   04/10/24 0528  Orthostatic Lying   BP- Lying 122/83  Pulse- Lying 93  Orthostatic Sitting  BP- Sitting 113/86  Pulse- Sitting 98  Orthostatic Standing at 0 minutes  BP- Standing at 0 minutes (!) 76/62  Pulse- Standing at 0 minutes 99  Orthostatic Standing at 3 minutes  BP- Standing at 3 minutes (!) 80/59  Pulse- Standing at 3 minutes 117  Note  Patient Observations  (Patient had TED hose and ABD binder on at time of Otho Vitals.)

## 2024-04-10 NOTE — Progress Notes (Addendum)
 PROGRESS NOTE        PATIENT DETAILS Name: John [name] Age: 63 y.o. Sex: male Date of Birth: 1961/02/24 Admit Date: 03/24/2024 Admitting Physician [name], MD [name], [name], MD  Brief Summary: Patient is a 63 y.o.  male with history of Parkinson's disease, recent PE on anticoagulation-presented with recurrent falls-confusion.  Underwent multiple attempts for MRI imaging of brain/spine-but due to lack of patient cooperation-poor study due to significant motion artifact, subsequently underwent MRI under anesthesia-which showed SDH and severe C-spine stenosis.  Significant events: 7/9>> admit to [facility]  Significant studies: 7/8>> CXR: No PNA 7/8>> x-ray pelvis: No fracture.  7/8>> CT head: No acute intracranial abnormality 7/8>> CT maxillofacial: Redemonstrated fractures involving the right maxillary sinus/orbit-stable since 6/25.  No new fractures. 7/8>> CT C-spine: No fracture. 7/9>> x-ray left knee: No fracture. 7/9>> CT head: No acute intracranial abnormality. 7/9>> CT C-spine: No acute fracture. 7/10>> MRI C-spine/T-spine/L-spine: Motion artifact-limited study. 7/14>> MRI brain: SDH overlying bilateral cerebral convexities. 7/14>> MRI C-spine: Severe spinal canal stenosis 7/14>> MRI T-spine: No signal abnormality within the thoracic spinal cord. 7/14>> MRI L-spine: Acute fracture of the S3 sacral segment (demonstrated on prior CT 6/25).  Lumbar spondylosis. 7/14>> CT head: Stable subacute to chronic 6 mm right and chronic 4 mm left subdural hematoma. 7/15-7/16>> LTM EEG: No seizures. 7/16-7/17>> LTM EEG: No seizures. 7/21>> CT head: Small SDH-stable/regressed from prior study  Significant microbiology data: 7/9>> blood culture:  Procedures: None  Consults: Neurology Neurosurgery  Subjective: No BM for the past several days but no complaints-wondering when he can be discharged from the hospital.   Objective: Vitals: Blood  pressure 122/83, pulse 93, temperature 98.2 F (36.8 C), temperature source Oral, resp. rate 18, height 6' 2 (1.88 m), weight 118 kg, SpO2 96%.   Exam: Awake/alert Extremities: No edema Abdomen: Soft nontender Nonfocal exam.   Pertinent Labs/Radiology:    Latest Ref Rng & Units 04/06/2024    8:00 AM 04/05/2024    6:21 AM 04/04/2024    7:25 AM  CBC  WBC 4.0 - 10.5 K/uL 6.3  7.0  5.8   Hemoglobin 13.0 - 17.0 g/dL 9.4  9.8  9.4   Hematocrit 39.0 - 52.0 % 30.8  31.9  30.0   Platelets 150 - 400 K/uL 347  358  341     Lab Results  Component Value Date   NA 141 03/30/2024   K 4.2 03/30/2024   CL 107 03/30/2024   CO2 21 (L) 03/30/2024    Assessment/Plan: Frequent falls with right cheek hematoma, left forehead laceration (sutured 7/8 in the ED) right maxillary sinus and orbit fracture (present prior to this admit), S3 sacral segment fracture (present prior to this admit) Likely related to gait instability/autonomic dysfunction in the setting of Parkinson's disease-along with degenerative disc disease/C-spine stenosis. PT/OT eval SNF recommended -awaiting SNF bed-see discussion below. Sutures removed 7/20  SDH Traumatic etiology Discussed with neurosurgeon-Dr. [name] 7/22-since repeat CT head on 7/21 with minimal residual SDH-tolerated IV heparin  for several days-has been subsequently transitioned to Eliquis   Cervical spinal stenosis Neurosurgery followed closely-exam remains stable Discussed with Dr. [name] 7/22-he prefers to have patient continue to rehab-and be several months out from most recent PE before considering decompression surgery. Follow-up with neurosurgery-Dr. [name] post discharge.  Acute metabolic encephalopathy Felt to be multifactorial-felt to be secondary to progressive  Parkinson's disease related dementia-superimposed on potential postconcussive syndrome/SDH/borderline vitamin B12 deficiency/UTI Mental status has gradually improved-he is mostly awake and alert  though at times he does have waxing/waning confusion. LTM EEG x 48 hours negative for seizures Neurology followed initially but has subsequently signed off.    Complicated UTI Completed IV Rocephin  course-unfortunately urine culture was never sent.  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 Difficult situation-significant falls recently even while at SNF-ongoing discussions by [facility] team-with SNF to see if we can arrange for prevention plan.  BPH with acute urinary retention Failed voiding trial-Foley catheter reinserted 7/13 Outpatient follow-up with urology-will d/c with Foley catheter.  Borderline vitamin B12 deficiency Being supplemented.  B6 deficiency Continue supplementation.  Disposition Family/sister resistant to patient going home back to the same SNF given recurrent falls.  See my note from 7/22. Per social worker-TOC leadership is reaching out to SNF to see if we can get a fall plan in place-before proceeding with discharge.  Class 1 Obesity: Estimated body mass index is 33.4 kg/m as calculated from the following:   Height as of this encounter: 6' 2 (1.88 m).   Weight as of this encounter: 118 kg.   Code status:   Code Status: Full Code   DVT Prophylaxis: Place TED hose Start: 03/26/24 0929 apixaban  (ELIQUIS ) tablet 5 mg   Family Communication: [name] updated 7/22   Disposition Plan: Status is: Inpatient Remains inpatient appropriate because: Severity of illness   Planned Discharge Destination:Skilled nursing facility   Diet: Diet Order             Diet regular Room service appropriate? Yes; Fluid consistency: Thin  Diet effective now                      Antimicrobial agents: Anti-infectives (From admission, onward)    Start     Dose/Rate Route Frequency Ordered Stop   03/24/24 2000  cefTRIAXone  (ROCEPHIN ) 1 g in sodium chloride  0.9 % 100 mL IVPB        1 g 200 mL/hr over 30 Minutes Intravenous Every 24 hours 03/24/24 1150 03/30/24 2226        MEDICATIONS: Scheduled Meds:  apixaban   5 mg Oral BID   carbidopa -levodopa   1 tablet Oral 6 times per day   Chlorhexidine  Gluconate Cloth  6 each Topical Daily   cyanocobalamin   1,000 mcg Subcutaneous Q30 days   feeding supplement  237 mL Oral BID BM   fludrocortisone   0.1 mg Oral Daily   midodrine   10 mg Oral TID WC   polyethylene glycol  17 g Oral Daily   pyridOXINE   50 mg Oral Daily   senna-docusate  1 tablet Oral BID   sodium chloride  flush  3 mL Intravenous Q12H   thiamine   100 mg Oral Daily   vitamin E   400 Units Oral Daily   zinc  sulfate (50mg  elemental zinc )  220 mg Oral Daily   Continuous Infusions:   PRN Meds:.acetaminophen  **OR** acetaminophen , albuterol , bisacodyl , LORazepam , ondansetron  **OR** ondansetron  (ZOFRAN ) IV, sodium chloride  flush, traZODone    I have personally reviewed following labs and imaging studies  LABORATORY DATA: CBC: Recent Labs  Lab 04/04/24 0725 04/05/24 0621 04/06/24 0800  WBC 5.8 7.0 6.3  HGB 9.4* 9.8* 9.4*  HCT 30.0* 31.9* 30.8*  MCV 87.0 88.1 87.7  PLT 341 358 347    Basic Metabolic Panel: No results for input(s): NA, K, CL, CO2, GLUCOSE, BUN, CREATININE, CALCIUM , MG, PHOS in the last 168 hours.   GFR: Estimated Creatinine Clearance: 96.4 mL/min (by C-G formula based on SCr of 1.07 mg/dL).  Liver Function Tests: No results for input(s): AST, ALT, ALKPHOS, BILITOT, PROT, ALBUMIN in the last 168 hours. No results for input(s): LIPASE, AMYLASE in the last 168 hours. No results for input(s): AMMONIA in the last 168 hours.  Coagulation Profile: No results for input(s): INR,  PROTIME in the last 168 hours.  Cardiac Enzymes: No results for input(s): CKTOTAL, CKMB, CKMBINDEX, TROPONINI in the last 168 hours.  BNP (last 3 results) No results for input(s): PROBNP in the last 8760 hours.  Lipid Profile: No results for input(s): CHOL, HDL, LDLCALC, TRIG, CHOLHDL, LDLDIRECT in the last 72 hours.  Thyroid Function Tests: No results for input(s): TSH, T4TOTAL, FREET4, T3FREE, THYROIDAB in the last 72 hours.  Anemia Panel: No results for input(s): VITAMINB12, FOLATE, FERRITIN, TIBC, IRON, RETICCTPCT in the last 72 hours.  Urine analysis:    Component Value Date/Time   COLORURINE AMBER (A) 03/23/2024 1845   APPEARANCEUR HAZY (A) 03/23/2024 1845   LABSPEC 1.016 03/23/2024 1845   LABSPEC 1.010 02/10/2024 1448   PHURINE 6.0 03/23/2024 1845   GLUCOSEU NEGATIVE 03/23/2024 1845   HGBUR SMALL (A) 03/23/2024 1845   BILIRUBINUR NEGATIVE 03/23/2024 1845   BILIRUBINUR negative 02/10/2024 1448   KETONESUR 5 (A) 03/23/2024 1845   PROTEINUR NEGATIVE 03/23/2024 1845   NITRITE NEGATIVE 03/23/2024 1845   LEUKOCYTESUR NEGATIVE 03/23/2024 1845    Sepsis Labs: Lactic Acid, Venous    Component Value Date/Time   LATICACIDVEN 1.2 03/16/2024 2143    MICROBIOLOGY: No results found for this or any previous visit (from the past 240 hours).   RADIOLOGY STUDIES/RESULTS: No results found.     LOS: 17 days   Donalda Applebaum, MD  [facility]    To contact the attending provider between 7A-7P or the covering provider during after hours 7P-7A, please log into the web site [email or web address] and access using universal [place] password for that web site. If you do not have the password, please call the hospital operator.  04/10/2024, 12:27 PM

## 2024-04-11 DIAGNOSIS — R296 Repeated falls: Secondary | ICD-10-CM | POA: Diagnosis not present

## 2024-04-11 LAB — BASIC METABOLIC PANEL WITH GFR
Anion gap: 10 (ref 5–15)
BUN: 14 mg/dL (ref 8–23)
CO2: 19 mmol/L — ABNORMAL LOW (ref 22–32)
Calcium: 9 mg/dL (ref 8.9–10.3)
Chloride: 109 mmol/L (ref 98–111)
Creatinine, Ser: 1.04 mg/dL (ref 0.61–1.24)
GFR, Estimated: >60 mL/min (ref >60)
Glucose, Bld: 100 mg/dL — ABNORMAL HIGH (ref 70–99)
Potassium: 3.5 mmol/L (ref 3.5–5.1)
Sodium: 138 mmol/L (ref 135–145)

## 2024-04-11 LAB — MAGNESIUM: Magnesium: 2 mg/dL (ref 1.7–2.4)

## 2024-04-11 LAB — CBC
HCT: 31.2 % — ABNORMAL LOW (ref 39.0–52.0)
Hemoglobin: 9.8 g/dL — ABNORMAL LOW (ref 13.0–17.0)
MCH: 27.1 pg (ref 26.0–34.0)
MCHC: 31.4 g/dL (ref 30.0–36.0)
MCV: 86.2 fL (ref 80.0–100.0)
Platelets: 353 K/uL (ref 150–400)
RBC: 3.62 MIL/uL — ABNORMAL LOW (ref 4.22–5.81)
RDW: 15.3 % (ref 11.5–15.5)
WBC: 8.5 K/uL (ref 4.0–10.5)
nRBC: 0 % (ref 0.0–0.2)

## 2024-04-11 NOTE — Plan of Care (Signed)

## 2024-04-11 NOTE — Progress Notes (Signed)
 PROGRESS NOTE        PATIENT DETAILS Name: John [name] Age: 63 y.o. Sex: male Date of Birth: January 01, 1961 Admit Date: 03/24/2024 Admitting Physician [name], MD [name], [name], MD  Brief Summary: Patient is a 63 y.o.  male with history of Parkinson's disease, recent PE on anticoagulation-presented with recurrent falls-confusion.  Underwent multiple attempts for MRI imaging of brain/spine-but due to lack of patient cooperation-poor study due to significant motion artifact, subsequently underwent MRI under anesthesia-which showed SDH and severe C-spine stenosis.  Significant events: 7/9>> admit to [facility]  Significant studies: 7/8>> CXR: No PNA 7/8>> x-ray pelvis: No fracture.  7/8>> CT head: No acute intracranial abnormality 7/8>> CT maxillofacial: Redemonstrated fractures involving the right maxillary sinus/orbit-stable since 6/25.  No new fractures. 7/8>> CT C-spine: No fracture. 7/9>> x-ray left knee: No fracture. 7/9>> CT head: No acute intracranial abnormality. 7/9>> CT C-spine: No acute fracture. 7/10>> MRI C-spine/T-spine/L-spine: Motion artifact-limited study. 7/14>> MRI brain: SDH overlying bilateral cerebral convexities. 7/14>> MRI C-spine: Severe spinal canal stenosis 7/14>> MRI T-spine: No signal abnormality within the thoracic spinal cord. 7/14>> MRI L-spine: Acute fracture of the S3 sacral segment (demonstrated on prior CT 6/25).  Lumbar spondylosis. 7/14>> CT head: Stable subacute to chronic 6 mm right and chronic 4 mm left subdural hematoma. 7/15-7/16>> LTM EEG: No seizures. 7/16-7/17>> LTM EEG: No seizures. 7/21>> CT head: Small SDH-stable/regressed from prior study  Significant microbiology data: 7/9>> blood culture:  Procedures: None  Consults: Neurology Neurosurgery  Subjective: Patient in bed, appears comfortable, denies any headache, no fever, no chest pain or pressure, no shortness of breath , no abdominal pain.  No focal weakness.  Objective: Vitals: Blood pressure 116/84, pulse 98, temperature 98 F (36.7 C), temperature source Oral, resp. rate 17, height 6' 2 (1.88 m), weight 118 kg, SpO2 95%.   Exam:  Awake, mildly confused, No new F.N deficits, Foley catheter in place [place].AT,PERRAL Supple Neck, No JVD,   Symmetrical Chest wall movement, Good air movement bilaterally, CTAB RRR,No Gallops, Rubs or new Murmurs,  +ve B.Sounds, Abd Soft, No tenderness,   No Cyanosis, Clubbing or edema   Assessment/Plan:  Frequent falls with right cheek hematoma, left forehead laceration (sutured 7/8 in the ED) right maxillary sinus and orbit fracture (present prior to this admit), S3 sacral segment fracture (present prior to this admit) Likely related to gait instability/autonomic dysfunction in the setting of Parkinson's disease-along with degenerative disc disease/C-spine stenosis. PT/OT eval SNF recommended -awaiting SNF bed-see discussion below. Sutures removed 7/20  SDH Traumatic etiology Discussed with neurosurgeon-Dr. [name] 7/22-since repeat CT head on 7/21 with minimal residual SDH-tolerated IV heparin  for several days-has been subsequently transitioned to Eliquis   Cervical spinal stenosis Neurosurgery followed closely-exam remains stable Discussed with Dr. [name] 7/22-he prefers to have patient continue to rehab-and be several months out from most recent PE before considering decompression surgery. Follow-up with neurosurgery-Dr. [name] post discharge.  Acute metabolic encephalopathy Felt to be multifactorial-felt to be secondary to progressive Parkinson's disease related dementia-superimposed on potential postconcussive syndrome/SDH/borderline vitamin B12 deficiency/UTI Mental status has gradually improved-he is mostly awake and alert though at times he does have waxing/waning confusion. LTM EEG x 48 hours negative for seizures Neurology followed initially but has subsequently signed off.     Complicated UTI Completed IV Rocephin  course-unfortunately urine culture was never sent.  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 Difficult situation-significant falls recently even while at SNF-ongoing discussions by [facility] team-with SNF to see if we can arrange for prevention plan.  BPH with acute urinary retention Failed voiding trial-Foley catheter reinserted 7/13 Outpatient follow-up with urology-will d/c with Foley catheter.  Borderline vitamin B12 deficiency Being supplemented.  B6 deficiency Continue supplementation.  Disposition Family/sister resistant to patient going home back to the same SNF given recurrent falls.  See my note from 7/22. Per social worker-TOC leadership is reaching out to SNF to see if we can get a fall plan in place-before proceeding with discharge.  Class 1 Obesity: Estimated body mass index is 33.4 kg/m as calculated from the following:   Height as of this encounter: 6' 2 (1.88 m).   Weight as of this encounter: 118 kg.   Code status:   Code Status: Full Code   DVT Prophylaxis: Place TED hose Start: 03/26/24 0929 apixaban  (ELIQUIS ) tablet 5 mg   Family Communication: Sister-[name]-[phone] updated 7/22   Disposition Plan: Status is: Inpatient Remains inpatient appropriate because: Severity of illness   Planned Discharge Destination:Skilled nursing facility   Diet: Diet Order             Diet regular Room service appropriate? Yes; Fluid consistency: Thin  Diet effective now                     Data Review:   Inpatient Medications  Scheduled Meds:  apixaban   5 mg Oral BID   carbidopa -levodopa   1 tablet Oral 6 times per day    Chlorhexidine  Gluconate Cloth  6 each Topical Daily   cyanocobalamin   1,000 mcg Subcutaneous Q30 days   feeding supplement  237 mL Oral BID BM   fludrocortisone   0.1 mg Oral Daily   midodrine   10 mg Oral TID WC   polyethylene glycol  17 g Oral Daily   pyridOXINE   50 mg Oral Daily   senna-docusate  1 tablet Oral BID   sodium chloride  flush  3 mL Intravenous Q12H   thiamine   100 mg Oral Daily   vitamin E   400 Units Oral Daily   zinc  sulfate (50mg  elemental zinc )  220 mg Oral Daily   Continuous Infusions: PRN Meds:.acetaminophen  **OR** acetaminophen , albuterol , bisacodyl , LORazepam , ondansetron  **OR** ondansetron  (ZOFRAN ) IV, sodium chloride  flush, traZODone   DVT Prophylaxis  Place TED hose Start: 03/26/24 0929 apixaban  (ELIQUIS ) tablet 5 mg    Recent Labs  Lab 04/05/24 0621 04/06/24 0800 04/11/24 0605  WBC 7.0 6.3 8.5  HGB 9.8* 9.4* 9.8*  HCT 31.9* 30.8* 31.2*  PLT 358 347 353  MCV 88.1 87.7 86.2  MCH 27.1 26.8 27.1  MCHC 30.7 30.5 31.4  RDW 15.5 15.8* 15.3    Recent Labs  Lab 04/11/24 0605  NA 138  K 3.5  CL 109  CO2 19*  ANIONGAP 10  GLUCOSE 100*  BUN 14  CREATININE 1.04  MG 2.0  CALCIUM  9.0      Recent Labs  Lab 04/11/24 0605  MG 2.0  CALCIUM  9.0    --------------------------------------------------------------------------------------------------------------- Lab Results  Component Value Date   CHOL 158 12/10/2023   HDL 58 12/10/2023   LDLCALC 73 12/10/2023   TRIG 156 (H) 12/10/2023   CHOLHDL 2.7 12/10/2023  No results found for: HGBA1C No results for input(s): TSH, T4TOTAL, FREET4, T3FREE, THYROIDAB in the last 72 hours. No results for input(s): VITAMINB12, FOLATE, FERRITIN, TIBC, IRON, RETICCTPCT in the last 72 hours. ------------------------------------------------------------------------------------------------------------------ Cardiac Enzymes No results for input(s): CKMB, TROPONINI, MYOGLOBIN in the  last 168 hours.  Invalid input(s): CK  Micro Results No results found for this or any previous visit (from the past 240 hours).  Radiology Reports  No results found.    Signature  -   Lavada Stank M.D on 04/11/2024 at 10:27 AM   -  To page go to [email or web address]

## 2024-04-12 DIAGNOSIS — R296 Repeated falls: Secondary | ICD-10-CM | POA: Diagnosis not present

## 2024-04-12 LAB — BASIC METABOLIC PANEL WITH GFR
Anion gap: 10 (ref 5–15)
BUN: 12 mg/dL (ref 8–23)
CO2: 19 mmol/L — ABNORMAL LOW (ref 22–32)
Calcium: 8.9 mg/dL (ref 8.9–10.3)
Chloride: 107 mmol/L (ref 98–111)
Creatinine, Ser: 1.11 mg/dL (ref 0.61–1.24)
GFR, Estimated: >60 mL/min (ref >60)
Glucose, Bld: 96 mg/dL (ref 70–99)
Potassium: 3.7 mmol/L (ref 3.5–5.1)
Sodium: 136 mmol/L (ref 135–145)

## 2024-04-12 LAB — CBC WITH DIFFERENTIAL/PLATELET
Abs Immature Granulocytes: 0.03 K/uL (ref 0.00–0.07)
Basophils Absolute: 0.1 K/uL (ref 0.0–0.1)
Basophils Relative: 1 %
Eosinophils Absolute: 0.3 K/uL (ref 0.0–0.5)
Eosinophils Relative: 4 %
HCT: 31.8 % — ABNORMAL LOW (ref 39.0–52.0)
Hemoglobin: 9.9 g/dL — ABNORMAL LOW (ref 13.0–17.0)
Immature Granulocytes: 0 %
Lymphocytes Relative: 22 %
Lymphs Abs: 1.7 K/uL (ref 0.7–4.0)
MCH: 27.3 pg (ref 26.0–34.0)
MCHC: 31.1 g/dL (ref 30.0–36.0)
MCV: 87.6 fL (ref 80.0–100.0)
Monocytes Absolute: 0.6 K/uL (ref 0.1–1.0)
Monocytes Relative: 8 %
Neutro Abs: 4.9 K/uL (ref 1.7–7.7)
Neutrophils Relative %: 65 %
Platelets: 335 K/uL (ref 150–400)
RBC: 3.63 MIL/uL — ABNORMAL LOW (ref 4.22–5.81)
RDW: 15.3 % (ref 11.5–15.5)
WBC: 7.6 K/uL (ref 4.0–10.5)
nRBC: 0 % (ref 0.0–0.2)

## 2024-04-12 LAB — PHOSPHORUS: Phosphorus: 2.9 mg/dL (ref 2.5–4.6)

## 2024-04-12 LAB — MAGNESIUM: Magnesium: 2.1 mg/dL (ref 1.7–2.4)

## 2024-04-12 MED ORDER — TRAMADOL HCL 50 MG PO TABS
50.0000 mg | ORAL_TABLET | Freq: Two times a day (BID) | ORAL | Status: DC | PRN
  Administered 2024-04-12 – 2024-04-16 (×3): 50 mg via ORAL
  Filled 2024-04-12 (×3): qty 1

## 2024-04-12 NOTE — Progress Notes (Signed)
 PROGRESS NOTE        PATIENT DETAILS Name: John [name] Age: 63 y.o. Sex: male Date of Birth: 1961/04/23 Admit Date: 03/24/2024 Admitting Physician [name], MD [name], [name], MD  Brief Summary: Patient is a 63 y.o.  male with history of Parkinson's disease, recent PE on anticoagulation-presented with recurrent falls-confusion.  Underwent multiple attempts for MRI imaging of brain/spine-but due to lack of patient cooperation-poor study due to significant motion artifact, subsequently underwent MRI under anesthesia-which showed SDH and severe C-spine stenosis.  Significant events: 7/9>> admit to [facility]  Significant studies: 7/8>> CXR: No PNA 7/8>> x-ray pelvis: No fracture.  7/8>> CT head: No acute intracranial abnormality 7/8>> CT maxillofacial: Redemonstrated fractures involving the right maxillary sinus/orbit-stable since 6/25.  No new fractures. 7/8>> CT C-spine: No fracture. 7/9>> x-ray left knee: No fracture. 7/9>> CT head: No acute intracranial abnormality. 7/9>> CT C-spine: No acute fracture. 7/10>> MRI C-spine/T-spine/L-spine: Motion artifact-limited study. 7/14>> MRI brain: SDH overlying bilateral cerebral convexities. 7/14>> MRI C-spine: Severe spinal canal stenosis 7/14>> MRI T-spine: No signal abnormality within the thoracic spinal cord. 7/14>> MRI L-spine: Acute fracture of the S3 sacral segment (demonstrated on prior CT 6/25).  Lumbar spondylosis. 7/14>> CT head: Stable subacute to chronic 6 mm right and chronic 4 mm left subdural hematoma. 7/15-7/16>> LTM EEG: No seizures. 7/16-7/17>> LTM EEG: No seizures. 7/21>> CT head: Small SDH-stable/regressed from prior study  Significant microbiology data: 7/9>> blood culture:  Procedures: None  Consults: Neurology Neurosurgery  Subjective:  Patient in bed, appears comfortable, denies any headache, no fever, no chest pain or pressure, no shortness of breath , no abdominal pain.  No new focal weakness.    Objective: Vitals: Blood pressure 115/81, pulse 92, temperature (!) 97.4 F (36.3 C), temperature source Oral, resp. rate 18, height 6' 2 (1.88 m), weight 118 kg, SpO2 92%.   Exam:  Awake, mildly confused, No new F.N deficits, Foley catheter in place [place].AT,PERRAL Supple Neck, No JVD,   Symmetrical Chest wall movement, Good air movement bilaterally, CTAB RRR,No Gallops, Rubs or new Murmurs,  +ve B.Sounds, Abd Soft, No tenderness,   No Cyanosis, Clubbing or edema   Assessment/Plan:  Frequent falls with right cheek hematoma, left forehead laceration (sutured 7/8 in the ED) right maxillary sinus and orbit fracture (present prior to this admit), S3 sacral segment fracture (present prior to this admit) Likely related to gait instability/autonomic dysfunction in the setting of Parkinson's disease-along with degenerative disc disease/C-spine stenosis. PT/OT eval SNF recommended -awaiting SNF bed-see discussion below. Sutures removed 7/20  SDH Traumatic etiology Discussed with neurosurgeon-Dr. [name] 7/22-since repeat CT head on 7/21 with minimal residual SDH-tolerated IV heparin  for several days-has been subsequently transitioned to Eliquis   Cervical spinal stenosis Neurosurgery followed closely-exam remains stable Discussed with Dr. [name] 7/22-he prefers to have patient continue to rehab-and be several months out from most recent PE before considering decompression surgery. Follow-up with neurosurgery-Dr. [name] post discharge.  Acute metabolic encephalopathy Felt to be multifactorial-felt to be secondary to progressive Parkinson's disease related dementia-superimposed on potential postconcussive syndrome/SDH/borderline vitamin B12 deficiency/UTI Mental status has gradually improved-he is mostly awake and alert though at times he does have waxing/waning confusion. LTM EEG x 48 hours negative for seizures Neurology followed initially but has subsequently  signed off.    Complicated UTI Completed IV Rocephin  course-unfortunately urine culture was never  sent.  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 Difficult situation-significant falls recently even while at SNF-ongoing discussions by [facility] team-with SNF to see if we can arrange for prevention plan.  BPH with acute urinary retention Failed voiding trial-Foley catheter reinserted 7/13 Outpatient follow-up with urology-will d/c with Foley catheter.  Borderline vitamin B12 deficiency Being supplemented.  B6 deficiency Continue supplementation.  Disposition Family/sister resistant to patient going home back to the same SNF given recurrent falls.  See my note from 7/22. Per social worker-TOC leadership is reaching out to SNF to see if we can get a fall plan in place-before proceeding with discharge.  [name] 1 Obesity: Estimated body mass index is 33.4 kg/m as calculated from the following:   Height as of this encounter: 6' 2 (1.88 m).   Weight as of this encounter: 118 kg.   Code status:   Code Status: Full Code   DVT Prophylaxis: Place TED hose Start: 04/11/24 1029 Place TED hose Start: 03/26/24 0929 apixaban  (ELIQUIS ) tablet 5 mg   Family Communication: Sister-[name] updated 7/22   Disposition Plan: Status is: Inpatient Remains inpatient appropriate because: Severity of illness   Planned Discharge Destination:Skilled nursing facility   Diet: Diet Order             Diet regular Room service appropriate? Yes; Fluid consistency: Thin  Diet effective now                     Data Review:   Inpatient Medications  Scheduled Meds:  apixaban   5 mg Oral BID    carbidopa -levodopa   1 tablet Oral 6 times per day   Chlorhexidine  Gluconate Cloth  6 each Topical Daily   cyanocobalamin   1,000 mcg Subcutaneous Q30 days   feeding supplement  237 mL Oral BID BM   fludrocortisone   0.1 mg Oral Daily   midodrine   10 mg Oral TID WC   polyethylene glycol  17 g Oral Daily   pyridOXINE   50 mg Oral Daily   senna-docusate  1 tablet Oral BID   sodium chloride  flush  3 mL Intravenous Q12H   thiamine   100 mg Oral Daily   vitamin E   400 Units Oral Daily   zinc  sulfate (50mg  elemental zinc )  220 mg Oral Daily   Continuous Infusions: PRN Meds:.acetaminophen  **OR** acetaminophen , albuterol , bisacodyl , LORazepam , ondansetron  **OR** ondansetron  (ZOFRAN ) IV, sodium chloride  flush, traZODone   DVT Prophylaxis  Place TED hose Start: 04/11/24 1029 Place TED hose Start: 03/26/24 0929 apixaban  (ELIQUIS ) tablet 5 mg    Recent Labs  Lab 04/06/24 0800 04/11/24 0605 04/12/24 0641  WBC 6.3 8.5 7.6  HGB 9.4* 9.8* 9.9*  HCT 30.8* 31.2* 31.8*  PLT 347 353 335  MCV 87.7 86.2 87.6  MCH 26.8 27.1 27.3  MCHC 30.5 31.4 31.1  RDW 15.8* 15.3 15.3  LYMPHSABS  --   --  1.7  MONOABS  --   --  0.6  EOSABS  --   --  0.3  BASOSABS  --   --  0.1    Recent Labs  Lab 04/11/24 0605 04/12/24 0641  NA 138 136  K 3.5 3.7  CL 109 107  CO2 19* 19*  ANIONGAP 10 10  GLUCOSE 100* 96  BUN 14 12  CREATININE 1.04 1.11  MG 2.0 2.1  PHOS  --  2.9  CALCIUM  9.0 8.9      Recent Labs  Lab 04/11/24 0605 04/12/24 0641  MG 2.0 2.1  CALCIUM  9.0 8.9    --------------------------------------------------------------------------------------------------------------- Lab Results  Component Value Date   CHOL 158 12/10/2023   HDL 58 12/10/2023   LDLCALC 73 12/10/2023   TRIG 156 (H) 12/10/2023   CHOLHDL 2.7 12/10/2023      Signature  -   Lavada Stank M.D on 04/12/2024 at 10:09 AM   -  To page go to [email or web address]

## 2024-04-12 NOTE — TOC Progression Note (Signed)
 Transition of Care [facility]) - Progression Note    Patient Details  Name: ORRY SIGL MRN: [id number] Date of Birth: Jul 23, 1961  Transition of Care [place]) CM/SW Contact  [name], LCSW Phone Number: 04/12/2024, 8:57 AM  Clinical Narrative:    8:57 AM-CSW requested [name] work on insurance process.    Expected Discharge Plan: Skilled Nursing Facility Barriers to Discharge: Continued Medical Work up, English as a second language teacher, SNF Pending bed offer               Expected Discharge Plan and Services In-house Referral: Clinical Social Work   Post Acute Care Choice: Skilled Nursing Facility Living arrangements for the past 2 months: Single Family Home, Skilled Nursing Facility                                       Social Drivers of Health (SDOH) Interventions SDOH Screenings   Food Insecurity: Patient Unable To Answer (03/25/2024)  Housing: Unknown (03/25/2024)  Transportation Needs: Patient Unable To Answer (03/25/2024)  Utilities: Patient Unable To Answer (03/25/2024)  Depression (PHQ2-9): Low Risk  (12/10/2023)  Tobacco Use: Medium Risk (03/29/2024)    Readmission Risk Interventions     No data to display

## 2024-04-12 NOTE — Plan of Care (Signed)
   Problem: Safety: Goal: Non-violent Restraint(s) Outcome: Not Progressing   Problem: Education: Goal: Knowledge of General Education information will improve Description: Including pain rating scale, medication(s)/side effects and non-pharmacologic comfort measures Outcome: Not Progressing   Problem: Health Behavior/Discharge Planning: Goal: Ability to manage health-related needs will improve Outcome: Not Progressing   Problem: Clinical Measurements: Goal: Ability to maintain clinical measurements within normal limits will improve Outcome: Not Progressing Goal: Will remain free from infection Outcome: Not Progressing Goal: Diagnostic test results will improve Outcome: Not Progressing Goal: Respiratory complications will improve Outcome: Not Progressing Goal: Cardiovascular complication will be avoided Outcome: Not Progressing   Problem: Activity: Goal: Risk for activity intolerance will decrease Outcome: Not Progressing   Problem: Nutrition: Goal: Adequate nutrition will be maintained Outcome: Not Progressing   Problem: Coping: Goal: Level of anxiety will decrease Outcome: Not Progressing   Problem: Elimination: Goal: Will not experience complications related to bowel motility Outcome: Not Progressing Goal: Will not experience complications related to urinary retention Outcome: Not Progressing   Problem: Pain Managment: Goal: General experience of comfort will improve and/or be controlled Outcome: Not Progressing   Problem: Safety: Goal: Ability to remain free from injury will improve Outcome: Not Progressing   Problem: Skin Integrity: Goal: Risk for impaired skin integrity will decrease Outcome: Not Progressing

## 2024-04-13 DIAGNOSIS — R296 Repeated falls: Secondary | ICD-10-CM | POA: Diagnosis not present

## 2024-04-13 MED ORDER — HYDROCODONE-ACETAMINOPHEN 5-325 MG PO TABS
1.0000 | ORAL_TABLET | Freq: Three times a day (TID) | ORAL | Status: DC | PRN
  Administered 2024-04-13 – 2024-04-15 (×4): 1 via ORAL
  Filled 2024-04-13 (×5): qty 1

## 2024-04-13 MED ORDER — ACETAMINOPHEN 500 MG PO TABS: 500.0000 mg | ORAL_TABLET | Freq: Four times a day (QID) | ORAL | Status: DC | PRN

## 2024-04-13 NOTE — Progress Notes (Signed)
 Physical Therapy Treatment Patient Details Name: John [name] MRN: [id number] DOB: [birth year] Today's Date: 04/13/2024   History of Present Illness Pt is a 63 year old man admitted from [facility] SNF with frequent falls and AMS. Head CT negative. +facial lacerations. MRI shows cervical stenosis and small bilat SDH; neurosurgery following. PMH: Parkinsons Disease, HTN, CVA, PE, anemia, recent B PEs, UTI.    PT Comments  Patient received in bed, states his legs are hurting. Unable to have more pain medication per patient. He is agreeable to PT session. Patient continues to be limited by orthostatic BP to progress mobility safely. He requires min- mod A for bed mobility, transfers and side stepping with RW. Patient lethargic during session easily falling asleep.  Poor awareness of safety/situation. He will continue to benefit from skilled PT to improve safety and independence.    If plan is discharge home, recommend the following: A lot of help with bathing/dressing/bathroom;Assist for transportation;Help with stairs or ramp for entrance;Supervision due to cognitive status;A lot of help with walking and/or transfers   Can travel by private vehicle     No  Equipment Recommendations  None recommended by PT    Recommendations for Other Services       Precautions / Restrictions Precautions Precautions: Fall Recall of Precautions/Restrictions: Impaired Precaution/Restrictions Comments: watch BP/ HR Restrictions Weight Bearing Restrictions Per Provider Order: No     Mobility  Bed Mobility Overal bed mobility: Needs Assistance Bed Mobility: Sit to Supine, Rolling, Sidelying to Sit Rolling: Mod assist Sidelying to sit: Mod assist   Sit to supine: Min assist   General bed mobility comments: mod A for trunk elevation and cues for technique.    Transfers Overall transfer level: Needs assistance Equipment used: Rolling walker (2 wheels) Transfers: Sit to/from Stand Sit to Stand:  From elevated surface, Min assist           General transfer comment: abel to side step about 4 feet with RW and min A    Ambulation/Gait     Assistive device: Rolling walker (2 wheels) Gait Pattern/deviations: Step-to pattern       General Gait Details: no ambulation away from bed due to orthostatic and lethargy   Stairs             Wheelchair Mobility     Tilt Bed    Modified Rankin (Stroke Patients Only)       Balance Overall balance assessment: Needs assistance Sitting-balance support: Feet supported Sitting balance-Leahy Scale: Good     Standing balance support: Bilateral upper extremity supported, During functional activity, Reliant on assistive device for balance Standing balance-Leahy Scale: Poor Standing balance comment: poor balance, cueing and min A to maintain balance, patient appeared to almost fall asleep while standing and began falling backward.                            Communication Communication Communication: No apparent difficulties  Cognition Arousal: Alert (somewhat lethargic at times throughout session) Behavior During Therapy: Flat affect   PT - Cognitive impairments: Orientation, Awareness, Memory, Attention, Safety/Judgement, Problem solving, No family/caregiver present to determine baseline                         Following commands: Intact Following commands impaired: Only follows one step commands consistently    Cueing Cueing Techniques: Verbal cues  Exercises      General Comments  Pertinent Vitals/Pain Pain Assessment Pain Assessment: Faces Faces Pain Scale: Hurts little more Pain Location: B legs    Home Living                          Prior Function            PT Goals (current goals can now be found in the care plan section) Acute Rehab PT Goals Patient Stated Goal: stop falling so much, get out of here PT Goal Formulation: With patient Time For Goal  Achievement: 04/29/24 Potential to Achieve Goals: Fair Progress towards PT goals: Not progressing toward goals - comment (medical limitations)    Frequency    Min 2X/week      PT Plan      Co-evaluation              AM-PAC PT 6 Clicks Mobility   Outcome Measure  Help needed turning from your back to your side while in a flat bed without using bedrails?: A Lot Help needed moving from lying on your back to sitting on the side of a flat bed without using bedrails?: A Lot Help needed moving to and from a bed to a chair (including a wheelchair)?: A Lot Help needed standing up from a chair using your arms (e.g., wheelchair or bedside chair)?: A Lot Help needed to walk in hospital room?: Total Help needed climbing 3-5 steps with a railing? : Total 6 Click Score: 10    End of Session Equipment Utilized During Treatment: Gait belt Activity Tolerance: Patient limited by lethargy;Treatment limited secondary to medical complications (Comment) (orthostatic) Patient left: in bed;with call bell/phone within reach;with bed alarm set Nurse Communication: Mobility status PT Visit Diagnosis: Unsteadiness on feet (R26.81);Other abnormalities of gait and mobility (R26.89);Other symptoms and signs involving the nervous system (R29.898);Muscle weakness (generalized) (M62.81);History of falling (Z91.81);Pain Pain - Right/Left:  (B legs) Pain - part of body: Leg     Time: 8940-8884 PT Time Calculation (min) (ACUTE ONLY): 16 min  Charges:    $Therapeutic Activity: 8-22 mins PT General Charges $$ ACUTE PT VISIT: 1 Visit                     Shakedra Beam, PT, GCS 04/13/24,11:27 AM

## 2024-04-13 NOTE — Progress Notes (Signed)
 PROGRESS NOTE        PATIENT DETAILS Name: John [name] Age: 62 y.o. Sex: male Date of Birth: January 10, 1961 Admit Date: 03/24/2024 Admitting Physician [name], MD [name], [name], MD  Brief Summary: Patient is a 62 y.o.  male with history of Parkinson's disease, recent PE on anticoagulation-presented with recurrent falls-confusion.  Underwent multiple attempts for MRI imaging of brain/spine-but due to lack of patient cooperation-poor study due to significant motion artifact, subsequently underwent MRI under anesthesia-which showed SDH and severe C-spine stenosis.  Significant events: 7/9>> admit to [facility]  Significant studies: 7/8>> CXR: No PNA 7/8>> x-ray pelvis: No fracture.  7/8>> CT head: No acute intracranial abnormality 7/8>> CT maxillofacial: Redemonstrated fractures involving the right maxillary sinus/orbit-stable since 6/25.  No new fractures. 7/8>> CT C-spine: No fracture. 7/9>> x-ray left knee: No fracture. 7/9>> CT head: No acute intracranial abnormality. 7/9>> CT C-spine: No acute fracture. 7/10>> MRI C-spine/T-spine/L-spine: Motion artifact-limited study. 7/14>> MRI brain: SDH overlying bilateral cerebral convexities. 7/14>> MRI C-spine: Severe spinal canal stenosis 7/14>> MRI T-spine: No signal abnormality within the thoracic spinal cord. 7/14>> MRI L-spine: Acute fracture of the S3 sacral segment (demonstrated on prior CT 6/25).  Lumbar spondylosis. 7/14>> CT head: Stable subacute to chronic 6 mm right and chronic 4 mm left subdural hematoma. 7/15-7/16>> LTM EEG: No seizures. 7/16-7/17>> LTM EEG: No seizures. 7/21>> CT head: Small SDH-stable/regressed from prior study  Significant microbiology data: 7/9>> blood culture:  Procedures: None  Consults: Neurology Neurosurgery  Subjective:  Patient in bed, appears comfortable, denies any headache, no fever, no chest pain or pressure, no shortness of breath , no abdominal pain.  No new focal weakness.    Objective: Vitals: Blood pressure 115/85, pulse 87, temperature 97.6 F (36.4 C), temperature source Oral, resp. rate 11, height 6' 2 (1.88 m), weight 118 kg, SpO2 95%.   Exam:  Awake, mildly confused, No new F.N deficits, Foley catheter in place [place].AT,PERRAL Supple Neck, No JVD,   Symmetrical Chest wall movement, Good air movement bilaterally, CTAB RRR,No Gallops, Rubs or new Murmurs,  +ve B.Sounds, Abd Soft, No tenderness,   No Cyanosis, Clubbing or edema   Assessment/Plan:  Frequent falls with right cheek hematoma, left forehead laceration (sutured 7/8 in the ED) right maxillary sinus and orbit fracture (present prior to this admit), S3 sacral segment fracture (present prior to this admit) Likely related to gait instability/autonomic dysfunction in the setting of Parkinson's disease-along with degenerative disc disease/C-spine stenosis. PT/OT eval SNF recommended -awaiting SNF bed-see discussion below. Sutures removed 7/20  SDH Traumatic etiology Discussed with neurosurgeon-Dr. [name] 7/22-since repeat CT head on 7/21 with minimal residual SDH-tolerated IV heparin  for several days-has been subsequently transitioned to Eliquis   Cervical spinal stenosis Neurosurgery followed closely-exam remains stable Discussed with Dr. [name] 7/22-he prefers to have patient continue to rehab-and be several months out from most recent PE before considering decompression surgery. Follow-up with neurosurgery-Dr. [name] post discharge.  Acute metabolic encephalopathy Felt to be multifactorial-felt to be secondary to progressive Parkinson's disease related dementia-superimposed on potential postconcussive syndrome/SDH/borderline vitamin B12 deficiency/UTI Mental status has gradually improved-he is mostly awake and alert though at times he does have waxing/waning confusion. LTM EEG x 48 hours negative for seizures Neurology followed initially but has subsequently signed  off.    Complicated UTI Completed IV Rocephin  course-unfortunately urine culture was never sent.  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 Difficult situation-significant falls recently even while at SNF-ongoing discussions by [facility] team-with SNF to see if we can arrange for prevention plan.  BPH with acute urinary retention Failed voiding trial-Foley catheter reinserted 7/13 Outpatient follow-up with urology-will d/c with Foley catheter.  Borderline vitamin B12 deficiency Being supplemented.  B6 deficiency Continue supplementation.  Disposition Family/sister resistant to patient going home back to the same SNF given recurrent falls.  See my note from 7/22. Per social worker-TOC leadership is reaching out to SNF to see if we can get a fall plan in place-before proceeding with discharge.  Class 1 Obesity: Estimated body mass index is 33.4 kg/m as calculated from the following:   Height as of this encounter: 6' 2 (1.88 m).   Weight as of this encounter: 118 kg.   Code status:   Code Status: Full Code   DVT Prophylaxis: Place TED hose Start: 04/11/24 1029 Place TED hose Start: 03/26/24 0929 apixaban  (ELIQUIS ) tablet 5 mg   Family Communication: Sister-[name] updated 04/06/24, 04/13/2024   Disposition Plan: Status is: Inpatient Remains inpatient appropriate because: Severity of illness   Planned Discharge Destination:Skilled nursing facility   Diet: Diet Order             Diet regular Room service appropriate? Yes; Fluid consistency: Thin  Diet effective now                     Data Review:   Inpatient Medications  Scheduled Meds:  apixaban   5 mg Oral BID    carbidopa -levodopa   1 tablet Oral 6 times per day   Chlorhexidine  Gluconate Cloth  6 each Topical Daily   cyanocobalamin   1,000 mcg Subcutaneous Q30 days   feeding supplement  237 mL Oral BID BM   fludrocortisone   0.1 mg Oral Daily   midodrine   10 mg Oral TID WC   polyethylene glycol  17 g Oral Daily   pyridOXINE   50 mg Oral Daily   senna-docusate  1 tablet Oral BID   thiamine   100 mg Oral Daily   vitamin E   400 Units Oral Daily   zinc  sulfate (50mg  elemental zinc )  220 mg Oral Daily   Continuous Infusions: PRN Meds:.acetaminophen  **OR** acetaminophen , albuterol , bisacodyl , LORazepam , ondansetron  **OR** ondansetron  (ZOFRAN ) IV, traMADol , traZODone   DVT Prophylaxis  Place TED hose Start: 04/11/24 1029 Place TED hose Start: 03/26/24 0929 apixaban  (ELIQUIS ) tablet 5 mg    Recent Labs  Lab 04/11/24 0605 04/12/24 0641  WBC 8.5 7.6  HGB 9.8* 9.9*  HCT 31.2* 31.8*  PLT 353 335  MCV 86.2 87.6  MCH 27.1 27.3  MCHC 31.4 31.1  RDW 15.3 15.3  LYMPHSABS  --  1.7  MONOABS  --  0.6  EOSABS  --  0.3  BASOSABS  --  0.1    Recent Labs  Lab 04/11/24 0605 04/12/24 0641  NA 138 136  K 3.5 3.7  CL 109 107  CO2 19* 19*  ANIONGAP 10 10  GLUCOSE 100* 96  BUN 14 12  CREATININE 1.04 1.11  MG 2.0 2.1  PHOS  --  2.9  CALCIUM  9.0 8.9      Recent Labs  Lab 04/11/24 0605 04/12/24 0641  MG 2.0 2.1  CALCIUM  9.0 8.9    --------------------------------------------------------------------------------------------------------------- Lab Results  Component Value Date   CHOL 158 12/10/2023   HDL 58 12/10/2023   LDLCALC 73 12/10/2023   TRIG 156 (H) 12/10/2023   CHOLHDL 2.7 12/10/2023      Signature  -   Lavada Stank M.D on 04/13/2024 at 9:10 AM   -  To page go to [email or web address]

## 2024-04-13 NOTE — Plan of Care (Signed)
 Alert, oriented x1-2. OOB to chair with assistance.  Medicated for pain, see MAR for details.   Problem: Education: Goal: Knowledge of General Education information will improve Description: Including pain rating scale, medication(s)/side effects and non-pharmacologic comfort measures Outcome: Progressing   Problem: Health Behavior/Discharge Planning: Goal: Ability to manage health-related needs will improve Outcome: Progressing   Problem: Clinical Measurements: Goal: Ability to maintain clinical measurements within normal limits will improve Outcome: Progressing Goal: Will remain free from infection Outcome: Progressing Goal: Diagnostic test results will improve Outcome: Progressing Goal: Respiratory complications will improve Outcome: Progressing Goal: Cardiovascular complication will be avoided Outcome: Progressing

## 2024-04-13 NOTE — TOC Progression Note (Signed)
 Transition of Care [facility]) - Progression Note    Patient Details  Name: John [name] MRN: [id number] Date of Birth: [birth year]  [facility] CM/SW Contact  [name], LCSW Phone Number: 04/13/2024, 3:01 PM  Clinical Narrative:    CSW spoke with patient's sister, [name], and provided update. She was able to visit with patient today. She spoke with Admissions at [name] who told her they would see what else can be done to help prevent falls and provide a floor mat but that there may not be anything else they can do.     Expected Discharge Plan: Skilled Nursing Facility Barriers to Discharge: Continued Medical Work up, English as a second language teacher, SNF Pending bed offer               Expected Discharge Plan and Services In-house Referral: Clinical Social Work   Post Acute Care Choice: Skilled Nursing Facility Living arrangements for the past 2 months: Single Family Home, Skilled Nursing Facility                                       Social Drivers of Health (SDOH) Interventions SDOH Screenings   Food Insecurity: Patient Unable To Answer (03/25/2024)  Housing: Unknown (03/25/2024)  Transportation Needs: Patient Unable To Answer (03/25/2024)  Utilities: Patient Unable To Answer (03/25/2024)  Depression (PHQ2-9): Low Risk  (12/10/2023)  Tobacco Use: Medium Risk (03/29/2024)    Readmission Risk Interventions     No data to display

## 2024-04-13 NOTE — Progress Notes (Signed)
 Occupational Therapy Treatment Patient Details Name: John [name] MRN: [id number] DOB: 1961/02/09 Today's Date: 04/13/2024   History of present illness Pt is a 63 year old man admitted from [facility] SNF with frequent falls and AMS. Head CT negative. +facial lacerations. MRI shows cervical stenosis and small bilat SDH; neurosurgery following. PMH: Parkinsons Disease, HTN, CVA, PE, anemia, recent B PEs, UTI.   OT comments  Pt resting in bed comfortably, no c/o of pain. Pt with positive orthostatics, BP 132/81 supine, 107/77 HR 106 EOB, 79/55 HR 121 standing, no c/o dizziness. Pt able to stand for 2-3 mins, and transfer to recliner, no overt LOB but min A to maintain safety and overall balance. Pt able to don/doff socks with mod A sitting EOB, posterior lean when reaching for feet, donned R sock, needed help with L sock. Pt displays poor safety awareness, decreased sequencing in complex tasks, and only able to follow simple commands consistently. Will continue to follow Pt acutely to progress as able, DC to postacute rehab <3hrs/day still appropriate.       If plan is discharge home, recommend the following:  Two people to help with walking and/or transfers;A lot of help with bathing/dressing/bathroom;Assistance with cooking/housework;Direct supervision/assist for medications management;Direct supervision/assist for financial management;Assist for transportation;Help with stairs or ramp for entrance   Equipment Recommendations  Wheelchair (measurements OT);Wheelchair cushion (measurements OT)    Recommendations for Other Services      Precautions / Restrictions Precautions Precautions: Fall Recall of Precautions/Restrictions: Impaired Precaution/Restrictions Comments: watch BP/ HR Restrictions Weight Bearing Restrictions Per Provider Order: No       Mobility Bed Mobility Overal bed mobility: Needs Assistance Bed Mobility: Supine to Sit     Supine to sit: Mod assist      General bed mobility comments: mod A to power sitting on EOB    Transfers Overall transfer level: Needs assistance Equipment used: Rolling walker (2 wheels) Transfers: Sit to/from Stand, Bed to chair/wheelchair/BSC Sit to Stand: Min assist, From elevated surface     Step pivot transfers: Min assist     General transfer comment: min A to power sitting up and maintain balance stepping to chair     Balance Overall balance assessment: Needs assistance Sitting-balance support: No upper extremity supported, Feet supported Sitting balance-Leahy Scale: Fair Sitting balance - Comments: posterior lean at times Postural control: Posterior lean Standing balance support: Bilateral upper extremity supported, During functional activity, Reliant on assistive device for balance Standing balance-Leahy Scale: Poor Standing balance comment: poor safety awareness, min A to maintain balance                           ADL either performed or assessed with clinical judgement   ADL Overall ADL's : Needs assistance/impaired Eating/Feeding: Set up;Sitting                   Lower Body Dressing: Minimal assistance;Moderate assistance;Sit to/from stand   Toilet Transfer: Minimal assistance;Rolling walker (2 wheels);BSC/3in1 Toilet Transfer Details (indicate cue type and reason): verbal cues to sit or Pt will just fall back into chair           General ADL Comments: Pt able to don/doff socks with min/mod A, posterior lean when reaching for B feet in figure 4, ands/transfers min A with RW, min A to maintain balance, cueing to sit or Pt will just fall back into chair    Extremity/Trunk Assessment Upper Extremity Assessment Upper Extremity Assessment:  Overall WFL for tasks assessed RUE Deficits / Details: R shoulder pain with AROM, overall WFL            Vision       Perception     Praxis     Communication Communication Communication: No apparent difficulties    Cognition Arousal: Alert Behavior During Therapy: Flat affect Cognition: Cognition impaired   Orientation impairments: Place, Time, Situation Awareness: Intellectual awareness impaired, Online awareness impaired Memory impairment (select all impairments): Working [employer] fast streamer, Conservation officer, historic buildings Attention impairment (select first level of impairment): Sustained attention Executive functioning impairment (select all impairments): Reasoning, Problem solving, Sequencing, Initiation OT - Cognition Comments: Pt able to follow commands but has difficulty sequencing multistep tasks, poor safety awareness                 Following commands: Intact Following commands impaired: Only follows one step commands consistently      Cueing   Cueing Techniques: Verbal cues  Exercises      Shoulder Instructions       General Comments BP supine 132/81, sitting EOB 107/77 HR 106, standing 79/55 HR 121    Pertinent Vitals/ Pain       Pain Assessment Pain Assessment: No/denies pain  Home Living                                          Prior Functioning/Environment              Frequency  Min 2X/week        Progress Toward Goals  OT Goals(current goals can now be found in the care plan section)  Progress towards OT goals: Progressing toward goals  Acute Rehab OT Goals Patient Stated Goal: not able to participate in setting goals OT Goal Formulation: Patient unable to participate in goal setting Time For Goal Achievement: 04/09/24 Potential to Achieve Goals: Fair ADL Goals Pt Will Perform Eating: Independently;sitting Pt Will Perform Grooming: with contact guard assist;standing Pt Will Perform Upper Body Bathing: with min assist;sitting Pt Will Perform Upper Body Dressing: with supervision;sitting Pt Will Perform Lower Body Dressing: with contact guard assist;sit to/from stand Pt Will Transfer to Toilet: with contact guard  assist;ambulating;bedside commode Pt Will Perform Toileting - Clothing Manipulation and hygiene: with contact guard assist;sit to/from stand Pt Will Perform Tub/Shower Transfer: with supervision;tub bench;Tub transfer;ambulating;rolling walker Additional ADL Goal #1: Pt will complete bed mobility modified independently in preparation for ADLs.  Plan      Co-evaluation                 AM-PAC OT 6 Clicks Daily Activity     Outcome Measure   Help from another person eating meals?: A Little Help from another person taking care of personal grooming?: A Little Help from another person toileting, which includes using toliet, bedpan, or urinal?: A Lot Help from another person bathing (including washing, rinsing, drying)?: A Lot Help from another person to put on and taking off regular upper body clothing?: A Little Help from another person to put on and taking off regular lower body clothing?: A Lot 6 Click Score: 15    End of Session Equipment Utilized During Treatment: Gait belt;Rolling walker (2 wheels)  OT Visit Diagnosis: Unsteadiness on feet (R26.81);Other abnormalities of gait and mobility (R26.89);Other symptoms and signs involving cognitive function;Muscle weakness (generalized) (M62.81);History of falling (Z91.81)   Activity Tolerance  Patient tolerated treatment well   Patient Left in chair;with call bell/phone within reach;with chair alarm set   Nurse Communication Mobility status        Time: 8865-8798 OT Time Calculation (min): 27 min  Charges: OT General Charges $OT Visit: 1 Visit OT Treatments $Self Care/Home Management : 8-22 mins $Therapeutic Activity: 8-22 mins  Larina Lieurance, OTR/L   Elouise JONELLE Bott 04/13/2024, 12:07 PM

## 2024-04-14 DIAGNOSIS — R296 Repeated falls: Secondary | ICD-10-CM | POA: Diagnosis not present

## 2024-04-14 MED ORDER — CALCIUM CARBONATE ANTACID 500 MG PO CHEW
1.0000 | CHEWABLE_TABLET | Freq: Four times a day (QID) | ORAL | Status: DC | PRN
  Administered 2024-04-14: 200 mg via ORAL
  Filled 2024-04-14: qty 1

## 2024-04-14 MED ORDER — MIDODRINE HCL 5 MG PO TABS
5.0000 mg | ORAL_TABLET | Freq: Two times a day (BID) | ORAL | Status: DC
  Administered 2024-04-14 – 2024-04-16 (×4): 5 mg via ORAL
  Filled 2024-04-14 (×4): qty 1

## 2024-04-14 NOTE — TOC Progression Note (Signed)
 Transition of Care [facility]) - Progression Note    Patient Details  Name: John [name] MRN: [id number] Date of Birth: 1961/08/08  [facility]) CM/SW Contact  [name], LCSW Phone Number: 04/14/2024, 4:08 PM  Clinical Narrative:    [name] has received insurance approval for discharge tomorrow. CSW updated MD and RN. Left voicemail to update patient's sister, [name].     Expected Discharge Plan: Skilled Nursing Facility Barriers to Discharge: Tele            Expected Discharge Plan and Services In-house Referral: Clinical Social Work   Post Acute Care Choice: Skilled Nursing Facility Living arrangements for the past 2 months: Single Family Home, Skilled Nursing Facility                                       Social Drivers of Health (SDOH) Interventions SDOH Screenings   Food Insecurity: Patient Unable To Answer (03/25/2024)  Housing: Unknown (03/25/2024)  Transportation Needs: Patient Unable To Answer (03/25/2024)  Utilities: Patient Unable To Answer (03/25/2024)  Depression (PHQ2-9): Low Risk  (12/10/2023)  Tobacco Use: Medium Risk (03/29/2024)    Readmission Risk Interventions     No data to display

## 2024-04-14 NOTE — Plan of Care (Signed)
 Patient AAOx3. Pain controlled with current regiment. Calm, cooperative and using call bell appropriately. Repositioning self in bed. Bed alarm on and call bell in reach.   Problem: Safety: Goal: Non-violent Restraint(s) Outcome: Progressing   Problem: Education: Goal: Knowledge of General Education information will improve Description: Including pain rating scale, medication(s)/side effects and non-pharmacologic comfort measures Outcome: Progressing   Problem: Health Behavior/Discharge Planning: Goal: Ability to manage health-related needs will improve Outcome: Progressing   Problem: Clinical Measurements: Goal: Ability to maintain clinical measurements within normal limits will improve Outcome: Progressing Goal: Will remain free from infection Outcome: Progressing Goal: Diagnostic test results will improve Outcome: Progressing Goal: Respiratory complications will improve Outcome: Progressing Goal: Cardiovascular complication will be avoided Outcome: Progressing   Problem: Activity: Goal: Risk for activity intolerance will decrease Outcome: Progressing   Problem: Nutrition: Goal: Adequate nutrition will be maintained Outcome: Progressing   Problem: Coping: Goal: Level of anxiety will decrease Outcome: Progressing   Problem: Elimination: Goal: Will not experience complications related to bowel motility Outcome: Progressing Goal: Will not experience complications related to urinary retention Outcome: Progressing   Problem: Pain Managment: Goal: General experience of comfort will improve and/or be controlled Outcome: Progressing   Problem: Safety: Goal: Ability to remain free from injury will improve Outcome: Progressing   Problem: Skin Integrity: Goal: Risk for impaired skin integrity will decrease Outcome: Progressing

## 2024-04-14 NOTE — Progress Notes (Signed)
 Speech Language Pathology Treatment: Cognitive-Linguistic  Patient Details Name: John [name] MRN: [id number] DOB: June 16, 1961 Today's Date: 04/14/2024 Time: 8744-8674 SLP Time Calculation (min) (ACUTE ONLY): 30 min  Assessment / Plan / Recommendation Clinical Impression  Pt seen for cognitive/linguistic treatment focusing on functional complex recall of safety precautions both within acute setting and once discharged to SNF with pt able to indicate with min verbal cues mobility device/need to A and call bell for medication for immediate need (heartburn).   Intellectual awareness improved with need for A at d/c vs independent living.  Recall for complex information regarding admission details improved with pt able to indicate detailed information to SLP with chart review utilized to confirm accuracy of information as no family available during session.  Pt's speech c/b slow, deliberate with monotone quality during communicative interactions, but 90% intelligible with repetition needed only when pt used longer utterances d/t decreased breath support.  Pt continues to require ST on a 1x/wk basis for cognitive/linguistic needs.  HPI HPI: Pt is a 63 year old man admitted from [place] SNF with frequent falls and acute metabolic encephalopathy. CT head 7/14 stable subacute to chronic 6 mm right and chronic 4 mm left subdural hematoma. Pt was driving; mild dysarthria, mild gait changes but independent prior to series of falls. PMH: Parkinsons Disease, HYN, CVA, PE, anemia, recent B PEs, UTI.      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

## 2024-04-14 NOTE — Progress Notes (Signed)
 PROGRESS NOTE        PATIENT DETAILS Name: John [name] Age: 63 y.o. Sex: male Date of Birth: [birth year] Admit Date: 03/24/2024 Admitting Physician John DELENA Sharps, MD [name], [name], MD  Brief Summary: Patient is a 63 y.o.  male with history of Parkinson's disease, recent PE on anticoagulation-presented with recurrent falls-confusion.  Underwent multiple attempts for MRI imaging of brain/spine-but due to lack of patient cooperation-poor study due to significant motion artifact, subsequently underwent MRI under anesthesia-which showed SDH and severe C-spine stenosis.  Significant events: 7/9>> admit to [facility]  Significant studies: 7/8>> CXR: No PNA 7/8>> x-ray pelvis: No fracture.  7/8>> CT head: No acute intracranial abnormality 7/8>> CT maxillofacial: Redemonstrated fractures involving the right maxillary sinus/orbit-stable since 6/25.  No new fractures. 7/8>> CT C-spine: No fracture. 7/9>> x-ray left knee: No fracture. 7/9>> CT head: No acute intracranial abnormality. 7/9>> CT C-spine: No acute fracture. 7/10>> MRI C-spine/T-spine/L-spine: Motion artifact-limited study. 7/14>> MRI brain: SDH overlying bilateral cerebral convexities. 7/14>> MRI C-spine: Severe spinal canal stenosis 7/14>> MRI T-spine: No signal abnormality within the thoracic spinal cord. 7/14>> MRI L-spine: Acute fracture of the S3 sacral segment (demonstrated on prior CT 6/25).  Lumbar spondylosis. 7/14>> CT head: Stable subacute to chronic 6 mm right and chronic 4 mm left subdural hematoma. 7/15-7/16>> LTM EEG: No seizures. 7/16-7/17>> LTM EEG: No seizures. 7/21>> CT head: Small SDH-stable/regressed from prior study  Significant microbiology data: 7/9>> blood culture:  Procedures: None  Consults: Neurology Neurosurgery  Subjective: Patient in bed, appears comfortable, denies any headache, no fever, no chest pain or pressure, no shortness of breath , no abdominal pain.  No new focal weakness.   Objective: Vitals: Blood pressure (!) 152/93, pulse 100, temperature 97.6 F (36.4 C), temperature source Oral, resp. rate 18, height 6' 2 (1.88 m), weight 118 kg, SpO2 97%.   Exam:  Awake, mildly confused, No new F.N deficits, Foley catheter in place [name],PERRAL Supple Neck, No JVD,   Symmetrical Chest wall movement, Good air movement bilaterally, CTAB RRR,No Gallops, Rubs or new Murmurs,  +ve B.Sounds, Abd Soft, No tenderness,   No Cyanosis, Clubbing or edema   Assessment/Plan:  Frequent falls with right cheek hematoma, left forehead laceration (sutured 7/8 in the ED) right maxillary sinus and orbit fracture (present prior to this admit), S3 sacral segment fracture (present prior to this admit) Likely related to gait instability/autonomic dysfunction in the setting of Parkinson's disease-along with degenerative disc disease/C-spine stenosis. PT/OT eval SNF recommended -awaiting SNF bed-see discussion below. Sutures removed 7/20  SDH Traumatic etiology Discussed with neurosurgeon-Dr. [name] 7/22-since repeat CT head on 7/21 with minimal residual SDH-tolerated IV heparin  for several days-has been subsequently transitioned to Eliquis   Cervical spinal stenosis Neurosurgery followed closely-exam remains stable Discussed with Dr. [name] 7/22-he prefers to have patient continue to rehab-and be several months out from most recent PE before considering decompression surgery. Follow-up with neurosurgery-Dr. [name] post discharge.  Acute metabolic encephalopathy Felt to be multifactorial-felt to be secondary to progressive Parkinson's disease related dementia-superimposed on potential postconcussive syndrome/SDH/borderline vitamin B12 deficiency/UTI Mental status has gradually improved-he is mostly awake and alert though at times he does have waxing/waning confusion. LTM EEG x 48 hours negative for seizures Neurology followed initially but has subsequently signed  off.    Complicated UTI Completed IV Rocephin  course-unfortunately urine culture was never sent.  Parkinson's disease Continue Sinemet   Orthostatic hypotension With relatively high supine blood pressures, placed on midodrine  and Florinef , TED stockings and abdominal binder as needed, he likely has advanced autonomic dysfunction due to underlying Parkinson's and will have unrelenting orthostatic hypotension, sister and patient both informed, continue to monitor.  Recent pulmonary embolism (March 10, 2024) Switched from IV heparin  to Eliquis  on 7/22. Difficult situation-significant falls recently even while at SNF-ongoing discussions by [facility] team-with SNF to see if we can arrange for prevention plan.  BPH with acute urinary retention Failed voiding trial-Foley catheter reinserted 7/13 Outpatient follow-up with urology-will d/c with Foley catheter.  Borderline vitamin B12 deficiency Being supplemented.  B6 deficiency Continue supplementation.  Disposition Family/sister resistant to patient going home back to the same SNF given recurrent falls.  See my note from 7/22. Per social worker-TOC leadership is reaching out to SNF to see if we can get a fall plan in place-before proceeding with discharge.  Class 1 Obesity: Estimated body mass index is 33.4 kg/m as calculated from the following:   Height as of this encounter: 6' 2 (1.88 m).   Weight as of this encounter: 118 kg.   Code status:   Code Status: Full Code   DVT Prophylaxis: Place TED hose Start: 04/11/24 1029 Place TED hose Start: 03/26/24 0929 apixaban  (ELIQUIS ) tablet 5 mg   Family Communication: Sister-[name]-[phone] updated 04/06/24, 04/13/2024   Disposition Plan: Status is: Inpatient Remains inpatient appropriate because: Severity of illness   Planned Discharge Destination:Skilled nursing facility   Diet: Diet Order             Diet regular Room service appropriate? Yes; Fluid consistency: Thin  Diet  effective now                     Data Review:   Inpatient Medications  Scheduled Meds:  apixaban   5 mg Oral BID   carbidopa -levodopa   1 tablet Oral 6 times per day   Chlorhexidine  Gluconate Cloth  6 each Topical Daily   cyanocobalamin   1,000 mcg Subcutaneous Q30 days   feeding supplement  237 mL Oral BID BM   fludrocortisone   0.1 mg Oral Daily   midodrine   10 mg Oral TID WC   polyethylene glycol  17 g Oral Daily   pyridOXINE   50 mg Oral Daily   senna-docusate  1 tablet Oral BID   thiamine   100 mg Oral Daily   vitamin E   400 Units Oral Daily   zinc  sulfate (50mg  elemental zinc )  220 mg Oral Daily   Continuous Infusions: PRN Meds:.acetaminophen  **OR** [DISCONTINUED] acetaminophen , albuterol , bisacodyl , HYDROcodone -acetaminophen , LORazepam , ondansetron  **OR** ondansetron  (ZOFRAN ) IV, traMADol , traZODone   DVT Prophylaxis  Place TED hose Start: 04/11/24 1029 Place TED hose Start: 03/26/24 0929 apixaban  (ELIQUIS ) tablet 5 mg    Recent Labs  Lab 04/11/24 0605 04/12/24 0641  WBC 8.5 7.6  HGB 9.8* 9.9*  HCT 31.2* 31.8*  PLT 353 335  MCV 86.2 87.6  MCH 27.1 27.3  MCHC 31.4 31.1  RDW 15.3 15.3  LYMPHSABS  --  1.7  MONOABS  --  0.6  EOSABS  --  0.3  BASOSABS  --  0.1    Recent Labs  Lab 04/11/24 0605 04/12/24 0641  NA 138 136  K 3.5 3.7  CL 109 107  CO2 19* 19*  ANIONGAP 10 10  GLUCOSE 100* 96  BUN 14 12  CREATININE 1.04 1.11  MG 2.0 2.1  PHOS  --  2.9  CALCIUM   9.0 8.9      Recent Labs  Lab 04/11/24 0605 04/12/24 0641  MG 2.0 2.1  CALCIUM  9.0 8.9    --------------------------------------------------------------------------------------------------------------- Lab Results  Component Value Date   CHOL 158 12/10/2023   HDL 58 12/10/2023   LDLCALC 73 12/10/2023   TRIG 156 (H) 12/10/2023   CHOLHDL 2.7 12/10/2023      Signature  -   Lavada Stank M.D on 04/14/2024 at 10:14 AM   -  To page go to [email or web address]

## 2024-04-15 DIAGNOSIS — R296 Repeated falls: Secondary | ICD-10-CM | POA: Diagnosis not present

## 2024-04-15 LAB — MAGNESIUM: Magnesium: 1.9 mg/dL (ref 1.7–2.4)

## 2024-04-15 LAB — CBC WITH DIFFERENTIAL/PLATELET
Abs Immature Granulocytes: 0.03 K/uL (ref 0.00–0.07)
Basophils Absolute: 0.1 K/uL (ref 0.0–0.1)
Basophils Relative: 1 %
Eosinophils Absolute: 0.3 K/uL (ref 0.0–0.5)
Eosinophils Relative: 4 %
HCT: 30.5 % — ABNORMAL LOW (ref 39.0–52.0)
Hemoglobin: 9.6 g/dL — ABNORMAL LOW (ref 13.0–17.0)
Immature Granulocytes: 0 %
Lymphocytes Relative: 21 %
Lymphs Abs: 1.6 K/uL (ref 0.7–4.0)
MCH: 27.4 pg (ref 26.0–34.0)
MCHC: 31.5 g/dL (ref 30.0–36.0)
MCV: 86.9 fL (ref 80.0–100.0)
Monocytes Absolute: 0.5 K/uL (ref 0.1–1.0)
Monocytes Relative: 6 %
Neutro Abs: 5.2 K/uL (ref 1.7–7.7)
Neutrophils Relative %: 68 %
Platelets: 314 K/uL (ref 150–400)
RBC: 3.51 MIL/uL — ABNORMAL LOW (ref 4.22–5.81)
RDW: 15.3 % (ref 11.5–15.5)
WBC: 7.6 K/uL (ref 4.0–10.5)
nRBC: 0 % (ref 0.0–0.2)

## 2024-04-15 LAB — BASIC METABOLIC PANEL WITH GFR
Anion gap: 8 (ref 5–15)
BUN: 9 mg/dL (ref 8–23)
CO2: 21 mmol/L — ABNORMAL LOW (ref 22–32)
Calcium: 9 mg/dL (ref 8.9–10.3)
Chloride: 109 mmol/L (ref 98–111)
Creatinine, Ser: 0.98 mg/dL (ref 0.61–1.24)
GFR, Estimated: >60 mL/min (ref >60)
Glucose, Bld: 89 mg/dL (ref 70–99)
Potassium: 3.3 mmol/L — ABNORMAL LOW (ref 3.5–5.1)
Sodium: 138 mmol/L (ref 135–145)

## 2024-04-15 LAB — PHOSPHORUS: Phosphorus: 2.3 mg/dL — ABNORMAL LOW (ref 2.5–4.6)

## 2024-04-15 MED ORDER — K PHOS MONO-SOD PHOS DI & MONO 155-852-130 MG PO TABS
500.0000 mg | ORAL_TABLET | Freq: Two times a day (BID) | ORAL | Status: AC
  Administered 2024-04-15 (×2): 500 mg via ORAL
  Filled 2024-04-15 (×2): qty 2

## 2024-04-15 MED ORDER — SODIUM PHOSPHATES 45 MMOLE/15ML IV SOLN: 30.0000 mmol | Freq: Once | INTRAVENOUS | Status: DC

## 2024-04-15 MED ORDER — POTASSIUM CHLORIDE CRYS ER 20 MEQ PO TBCR
40.0000 meq | EXTENDED_RELEASE_TABLET | Freq: Once | ORAL | Status: AC
  Administered 2024-04-15: 40 meq via ORAL
  Filled 2024-04-15: qty 2

## 2024-04-15 NOTE — Progress Notes (Signed)
 Physical Therapy Treatment Patient Details Name: ALEXIOS KEOWN MRN: [id number] DOB: January 08, 1961 Today's Date: 04/15/2024   History of Present Illness Pt is a 63 year old man admitted from [facility] SNF with frequent falls and AMS. Head CT negative. +facial lacerations. MRI shows cervical stenosis and small bilat SDH; neurosurgery following. PMH: Parkinsons Disease, HTN, CVA, PE, anemia, recent B PEs, UTI.    PT Comments  Pt tolerated treatment well today. Initially trialed ambulation however noted with very heavy posterior lean upon standing and requesting to return to bed. +2 Mod A throughout. No change in DC/DME recs at this time. PT will continue to follow.     If plan is discharge home, recommend the following: A lot of help with bathing/dressing/bathroom;Assist for transportation;Help with stairs or ramp for entrance;Supervision due to cognitive status;A lot of help with walking and/or transfers   Can travel by private vehicle     No  Equipment Recommendations  None recommended by PT    Recommendations for Other Services       Precautions / Restrictions Precautions Precautions: Fall Recall of Precautions/Restrictions: Impaired Precaution/Restrictions Comments: watch BP/ HR Restrictions Weight Bearing Restrictions Per Provider Order: No     Mobility  Bed Mobility Overal bed mobility: Needs Assistance Bed Mobility: Sit to Supine       Sit to supine: Min assist   General bed mobility comments: Min A for BLE management    Transfers Overall transfer level: Needs assistance Equipment used: Rolling walker (2 wheels) Transfers: Sit to/from Stand, Bed to chair/wheelchair/BSC Sit to Stand: +2 physical assistance, Mod assist   Step pivot transfers: +2 physical assistance, Mod assist       General transfer comment: Cues for hand placement. Initially trialed ambulation however noted with very heavy posterior lean upon standing and requesting to return to bed. +2 Mod A  throughout.    Ambulation/Gait               General Gait Details: no ambulation away from bed due to orthostatic and lethargy   Stairs             Wheelchair Mobility     Tilt Bed    Modified Rankin (Stroke Patients Only)       Balance Overall balance assessment: Needs assistance Sitting-balance support: No upper extremity supported, Feet supported Sitting balance-Leahy Scale: Fair Sitting balance - Comments: posterior lean at times Postural control: Posterior lean Standing balance support: Bilateral upper extremity supported, During functional activity, Reliant on assistive device for balance Standing balance-Leahy Scale: Poor Standing balance comment: poor safety awareness, min A to maintain balance                            Communication Communication Communication: No apparent difficulties Factors Affecting Communication: Reduced clarity of speech  Cognition Arousal: Alert Behavior During Therapy: Flat affect   PT - Cognitive impairments: Orientation, Awareness, Memory, Attention, Safety/Judgement, Problem solving, No family/caregiver present to determine baseline   Orientation impairments: Situation, Place, Time                   PT - Cognition Comments: Poor safety awareness and awareness of deficits with pt stating he could go home tomorrow and ambulate with his quad cane Following commands: Intact Following commands impaired: Only follows one step commands consistently    Cueing Cueing Techniques: Verbal cues  Exercises      General Comments General comments (skin integrity, edema,  etc.): NAD      Pertinent Vitals/Pain Pain Assessment Pain Assessment: Faces Faces Pain Scale: Hurts little more Pain Location: top of R foot Pain Descriptors / Indicators: Discomfort Pain Intervention(s): Monitored during session, Limited activity within patient's tolerance    Home Living                          Prior  Function            PT Goals (current goals can now be found in the care plan section) Progress towards PT goals: Progressing toward goals    Frequency    Min 2X/week      PT Plan      Co-evaluation              AM-PAC PT 6 Clicks Mobility   Outcome Measure  Help needed turning from your back to your side while in a flat bed without using bedrails?: A Lot Help needed moving from lying on your back to sitting on the side of a flat bed without using bedrails?: A Lot Help needed moving to and from a bed to a chair (including a wheelchair)?: A Lot Help needed standing up from a chair using your arms (e.g., wheelchair or bedside chair)?: A Lot Help needed to walk in hospital room?: Total Help needed climbing 3-5 steps with a railing? : Total 6 Click Score: 10    End of Session Equipment Utilized During Treatment: Gait belt Activity Tolerance: Patient limited by lethargy;Treatment limited secondary to medical complications (Comment) Patient left: in bed;with call bell/phone within reach;with bed alarm set Nurse Communication: Mobility status PT Visit Diagnosis: Unsteadiness on feet (R26.81);Other abnormalities of gait and mobility (R26.89);Other symptoms and signs involving the nervous system (R29.898);Muscle weakness (generalized) (M62.81);History of falling (Z91.81);Pain Pain - Right/Left: Right Pain - part of body: Ankle and joints of foot     Time: 8562-8547 PT Time Calculation (min) (ACUTE ONLY): 15 min  Charges:    $Therapeutic Activity: 8-22 mins PT General Charges $$ ACUTE PT VISIT: 1 Visit                     Sueellen NOVAK, PT, DPT Acute Rehab Services [phone]    Demar Shad 04/15/2024, 3:58 PM

## 2024-04-15 NOTE — Progress Notes (Addendum)
 Patient noted to be impacted, attempting to have bowel movement, when this nurse tried to wipe the patient, bleeding noted from overstretched anal opening from impacted stool. Updated Dr. Franky, and will order senna as suppository cannot be inserted due to impaction.  Per Landen, NT he assisted patient and he was able to defecate. Patient refused prune juice.

## 2024-04-15 NOTE — Plan of Care (Signed)
  Problem: Clinical Measurements: Goal: Diagnostic test results will improve 04/15/2024 0600 by Mallory Marks, Khalea Ventura Anne D, RN Outcome: Not Progressing   Problem: Activity: Goal: Risk for activity intolerance will decrease 04/15/2024 0600 by Mallory Marks, Matti Killingsworth Anne D, RN Outcome: Not Progressing 04/15/2024 0600 by Mallory Marks, Grayton Lobo Anne D, RN Outcome: Not Progressing 04/15/2024 0117 by Mallory Marks, Kyro Joswick Anne D, RN Outcome: Not Progressing   Problem: Elimination: Goal: Will not experience complications related to urinary retention 04/15/2024 0600 by Mallory Marks, Lyonel Morejon Anne D, RN Outcome: Not Progressing 04/15/2024 0600 by Mallory Marks, Norrin Shreffler Anne D, RN Outcome: Not Progressing 04/15/2024 0117 by Mallory Marks, Berenise Hunton Anne D, RN Outcome: Not Progressing   Problem: Safety: Goal: Ability to remain free from injury will improve 04/15/2024 0600 by Mallory Marks, Melah Ebling Anne D, RN Outcome: Not Progressing 04/15/2024 0600 by Dela Pena, Jacqualin Shirkey Anne D, RN Outcome: Not Progressing 04/15/2024 0117 by Mallory Marks, Medora Roorda Anne D, RN Outcome: Not Progressing

## 2024-04-15 NOTE — Plan of Care (Signed)

## 2024-04-15 NOTE — Progress Notes (Signed)
 PROGRESS NOTE        PATIENT DETAILS Name: John [name] Age: 63 y.o. Sex: male Date of Birth: [birth year] Admit Date: 03/24/2024 Admitting Physician [name], MD [name], [name], MD  Brief Summary: Patient is a 63 y.o.  male with history of Parkinson's disease, recent PE on anticoagulation-presented with recurrent falls-confusion.  Underwent multiple attempts for MRI imaging of brain/spine-but due to lack of patient cooperation-poor study due to significant motion artifact, subsequently underwent MRI under anesthesia-which showed SDH and severe C-spine stenosis.  Significant events: 7/9>> admit to [facility]  Significant studies: 7/8>> CXR: No PNA 7/8>> x-ray pelvis: No fracture.  7/8>> CT head: No acute intracranial abnormality 7/8>> CT maxillofacial: Redemonstrated fractures involving the right maxillary sinus/orbit-stable since 6/25.  No new fractures. 7/8>> CT C-spine: No fracture. 7/9>> x-ray left knee: No fracture. 7/9>> CT head: No acute intracranial abnormality. 7/9>> CT C-spine: No acute fracture. 7/10>> MRI C-spine/T-spine/L-spine: Motion artifact-limited study. 7/14>> MRI brain: SDH overlying bilateral cerebral convexities. 7/14>> MRI C-spine: Severe spinal canal stenosis 7/14>> MRI T-spine: No signal abnormality within the thoracic spinal cord. 7/14>> MRI L-spine: Acute fracture of the S3 sacral segment (demonstrated on prior CT 6/25).  Lumbar spondylosis. 7/14>> CT head: Stable subacute to chronic 6 mm right and chronic 4 mm left subdural hematoma. 7/15-7/16>> LTM EEG: No seizures. 7/16-7/17>> LTM EEG: No seizures. 7/21>> CT head: Small SDH-stable/regressed from prior study  Significant microbiology data: 7/9>> blood culture:  Procedures: None  Consults: Neurology Neurosurgery  Subjective:  Patient in bed, appears comfortable, denies any headache, no fever, no chest pain or pressure, no shortness of breath , no abdominal pain.  No new focal weakness.   Objective: Vitals: Blood pressure 122/83, pulse 94, temperature (!) 97.4 F (36.3 C), temperature source Oral, resp. rate 17, height 6' 2 (1.88 m), weight 118 kg, SpO2 97%.   Exam:  Awake, mildly confused, No new F.N deficits, Foley catheter in place [place].AT,PERRAL Supple Neck, No JVD,   Symmetrical Chest wall movement, Good air movement bilaterally, CTAB RRR,No Gallops, Rubs or new Murmurs,  +ve B.Sounds, Abd Soft, No tenderness,   No Cyanosis, Clubbing or edema   Assessment/Plan:  Frequent falls with right cheek hematoma, left forehead laceration (sutured 7/8 in the ED) right maxillary sinus and orbit fracture (present prior to this admit), S3 sacral segment fracture (present prior to this admit) Likely related to gait instability/autonomic dysfunction in the setting of Parkinson's disease-along with degenerative disc disease/C-spine stenosis. PT/OT eval SNF recommended -awaiting SNF bed-see discussion below. Sutures removed 7/20  SDH Traumatic etiology Discussed with neurosurgeon-Dr. [name] 7/22-since repeat CT head on 7/21 with minimal residual SDH-tolerated IV heparin  for several days-has been subsequently transitioned to Eliquis   Cervical spinal stenosis Neurosurgery followed closely-exam remains stable Discussed with Dr. [name] 7/22-he prefers to have patient continue to rehab-and be several months out from most recent PE before considering decompression surgery. Follow-up with neurosurgery-Dr. [name] post discharge.  Acute metabolic encephalopathy Felt to be multifactorial-felt to be secondary to progressive Parkinson's disease related dementia-superimposed on potential postconcussive syndrome/SDH/borderline vitamin B12 deficiency/UTI Mental status has gradually improved-he is mostly awake and alert though at times he does have waxing/waning confusion. LTM EEG x 48 hours negative for seizures Neurology followed initially but has subsequently signed  off.    Complicated UTI Completed IV Rocephin  course-unfortunately urine culture was never sent.  Parkinson's disease Continue Sinemet   Orthostatic hypotension With relatively high supine blood pressures, placed on midodrine  and Florinef , TED stockings and abdominal binder as needed, he likely has advanced autonomic dysfunction due to underlying Parkinson's and will have unrelenting orthostatic hypotension, sister and patient both informed, continue to monitor.  Recent pulmonary embolism (March 10, 2024) Switched from IV heparin  to Eliquis  on 7/22. Difficult situation-significant falls recently even while at SNF-ongoing discussions by [facility] team-with SNF to see if we can arrange for prevention plan.  BPH with acute urinary retention Failed voiding trial-Foley catheter reinserted 7/13 Outpatient follow-up with urology-will d/c with Foley catheter.  Borderline vitamin B12 deficiency Being supplemented.  B6 deficiency Continue supplementation.  Hypokalemia.  Replace.    Disposition Family/sister resistant to patient going home back to the same SNF given recurrent falls.  See my note from 7/22. Per social worker-TOC leadership is reaching out to SNF to see if we can get a fall plan in place-before proceeding with discharge.  Class 1 Obesity: Estimated body mass index is 33.4 kg/m as calculated from the following:   Height as of this encounter: 6' 2 (1.88 m).   Weight as of this encounter: 118 kg.   Code status:   Code Status: Full Code   DVT Prophylaxis: Place TED hose Start: 04/11/24 1029 Place TED hose Start: 03/26/24 0929 apixaban  (ELIQUIS ) tablet 5 mg   Family Communication: Sister-[name] updated 04/06/24, 04/13/2024   Disposition Plan: Status is: Inpatient Remains inpatient appropriate because: Severity of illness   Planned Discharge Destination:Skilled nursing facility   Diet: Diet Order             Diet regular Room service appropriate? Yes; Fluid  consistency: Thin  Diet effective now                     Data Review:   Inpatient Medications  Scheduled Meds:  apixaban   5 mg Oral BID   carbidopa -levodopa   1 tablet Oral 6 times per day   Chlorhexidine  Gluconate Cloth  6 each Topical Daily   cyanocobalamin   1,000 mcg Subcutaneous Q30 days   feeding supplement  237 mL Oral BID BM   fludrocortisone   0.1 mg Oral Daily   midodrine   5 mg Oral BID WC   polyethylene glycol  17 g Oral Daily   potassium chloride   40 mEq Oral Once   pyridOXINE   50 mg Oral Daily   senna-docusate  1 tablet Oral BID   thiamine   100 mg Oral Daily   vitamin E   400 Units Oral Daily   zinc  sulfate (50mg  elemental zinc )  220 mg Oral Daily   Continuous Infusions: PRN Meds:.acetaminophen  **OR** [DISCONTINUED] acetaminophen , albuterol , bisacodyl , calcium  carbonate, HYDROcodone -acetaminophen , LORazepam , ondansetron  **OR** ondansetron  (ZOFRAN ) IV, traMADol , traZODone   DVT Prophylaxis  Place TED hose Start: 04/11/24 1029 Place TED hose Start: 03/26/24 0929 apixaban  (ELIQUIS ) tablet 5 mg    Recent Labs  Lab 04/11/24 0605 04/12/24 0641 04/15/24 0601  WBC 8.5 7.6 7.6  HGB 9.8* 9.9* 9.6*  HCT 31.2* 31.8* 30.5*  PLT 353 335 314  MCV 86.2 87.6 86.9  MCH 27.1 27.3 27.4  MCHC 31.4 31.1 31.5  RDW 15.3 15.3 15.3  LYMPHSABS  --  1.7 1.6  MONOABS  --  0.6 0.5  EOSABS  --  0.3 0.3  BASOSABS  --  0.1 0.1    Recent Labs  Lab 04/11/24 0605 04/12/24 0641 04/15/24 0601  NA 138 136 138  K 3.5 3.7 3.3*  CL 109 107 109  CO2 19* 19* 21*  ANIONGAP 10 10 8   GLUCOSE 100* 96 89  BUN 14 12 9   CREATININE 1.04 1.11 0.98  MG 2.0 2.1 1.9  PHOS  --  2.9 2.3*  CALCIUM  9.0 8.9 9.0      Recent Labs  Lab 04/11/24 0605 04/12/24 0641 04/15/24 0601  MG 2.0 2.1 1.9  CALCIUM  9.0 8.9 9.0    --------------------------------------------------------------------------------------------------------------- Lab Results  Component Value Date   CHOL 158 12/10/2023    HDL 58 12/10/2023   LDLCALC 73 12/10/2023   TRIG 156 (H) 12/10/2023   CHOLHDL 2.7 12/10/2023      Signature  -   Lavada Stank M.D on 04/15/2024 at 10:18 AM   -  To page go to [email or web address]

## 2024-04-15 NOTE — TOC Progression Note (Signed)
 Transition of Care [facility]) - Progression Note    Patient Details  Name: John [name] MRN: [id number] Date of Birth: 07-14-1961  [facility] CM/SW Contact  [name], LCSW Phone Number: 04/15/2024, 11:38 AM  Clinical Narrative:    CSW updated [name] that patient is not medically ready today per MD. They are able to accept patient tomorrow. CSW updated patient's sister, John [name].    Expected Discharge Plan: Skilled Nursing Facility Barriers to Discharge: Continued Medical Work up               Expected Discharge Plan and Services In-house Referral: Clinical Social Work   Post Acute Care Choice: Skilled Nursing Facility Living arrangements for the past 2 months: Single Family Home, Skilled Nursing Facility                                       Social Drivers of Health (SDOH) Interventions SDOH Screenings   Food Insecurity: Patient Unable To Answer (03/25/2024)  Housing: Unknown (03/25/2024)  Transportation Needs: Patient Unable To Answer (03/25/2024)  Utilities: Patient Unable To Answer (03/25/2024)  Depression (PHQ2-9): Low Risk  (12/10/2023)  Tobacco Use: Medium Risk (03/29/2024)    Readmission Risk Interventions     No data to display

## 2024-04-16 DIAGNOSIS — R296 Repeated falls: Secondary | ICD-10-CM | POA: Diagnosis not present

## 2024-04-16 LAB — BASIC METABOLIC PANEL WITH GFR
Anion gap: 8 (ref 5–15)
BUN: 12 mg/dL (ref 8–23)
CO2: 21 mmol/L — ABNORMAL LOW (ref 22–32)
Calcium: 8.7 mg/dL — ABNORMAL LOW (ref 8.9–10.3)
Chloride: 109 mmol/L (ref 98–111)
Creatinine, Ser: 1.07 mg/dL (ref 0.61–1.24)
GFR, Estimated: >60 mL/min (ref >60)
Glucose, Bld: 108 mg/dL — ABNORMAL HIGH (ref 70–99)
Potassium: 3.3 mmol/L — ABNORMAL LOW (ref 3.5–5.1)
Sodium: 138 mmol/L (ref 135–145)

## 2024-04-16 LAB — PHOSPHORUS: Phosphorus: 3.4 mg/dL (ref 2.5–4.6)

## 2024-04-16 MED ORDER — VITAMIN B-1 100 MG PO TABS: 100.0000 mg | ORAL_TABLET | Freq: Every day | ORAL | Status: AC

## 2024-04-16 MED ORDER — HYDROCODONE-ACETAMINOPHEN 5-325 MG PO TABS: 1.0000 | ORAL_TABLET | Freq: Three times a day (TID) | ORAL | 0 refills | Status: DC | PRN

## 2024-04-16 MED ORDER — MIDODRINE HCL 5 MG PO TABS: 5.0000 mg | ORAL_TABLET | Freq: Two times a day (BID) | ORAL | Status: AC

## 2024-04-16 MED ORDER — POTASSIUM CHLORIDE CRYS ER 20 MEQ PO TBCR
40.0000 meq | EXTENDED_RELEASE_TABLET | Freq: Once | ORAL | Status: AC
  Administered 2024-04-16: 40 meq via ORAL
  Filled 2024-04-16: qty 2

## 2024-04-16 MED ORDER — PYRIDOXINE HCL 50 MG PO TABS: 50.0000 mg | ORAL_TABLET | Freq: Every day | ORAL | Status: AC

## 2024-04-16 NOTE — Discharge Instructions (Signed)
 Follow with Primary MD [name], MD in 7 days   Get CBC, CMP, Magnesium , 2 view Chest X ray -  checked next visit with your primary MD or SNF MD    Activity: As tolerated with Full fall precautions use walker/cane & assistance as needed, high fall risk, chronic orthostatic hypotension.  Disposition SNF  Diet: Heart Healthy   Special Instructions: If you have smoked or chewed Tobacco  in the last 2 yrs please stop smoking, stop any regular Alcohol  and or any Recreational drug use.  On your next visit with your primary care physician please Get Medicines reviewed and adjusted.  Please request your Prim.MD to go over all Hospital Tests and Procedure/Radiological results at the follow up, please get all Hospital records sent to your Prim MD by signing hospital release before you go home.  If you experience worsening of your admission symptoms, develop shortness of breath, life threatening emergency, suicidal or homicidal thoughts you must seek medical attention immediately by calling 911 or calling your MD immediately  if symptoms less severe.  You Must read complete instructions/literature along with all the possible adverse reactions/side effects for all the Medicines you take and that have been prescribed to you. Take any new Medicines after you have completely understood and accpet all the possible adverse reactions/side effects.   Do not drive when taking Pain medications.  Do not take more than prescribed Pain, Sleep and Anxiety Medications  Wear Seat belts while driving.

## 2024-04-16 NOTE — TOC Transition Note (Signed)
 Transition of Care [facility]) - Discharge Note   Patient Details  Name: John [name] MRN: [id number] Date of Birth: 03/11/1961  [facility]) CM/SW Contact:  Gwenn Julien Norris, [place] Phone Number: 04/16/2024, 10:44 AM   Clinical Narrative: Pt for dc to [facility] today. Spoke to [facility] in admissions who confirmed they are prepared to admit pt to room 212A. Pt's sister [name] aware of dc and reports agreeable. RN provided with number for report and PTAR arranged for transport. SW signing off at dc.   Julien Gwenn, MSW, LCSW [phone] (coverage)        Final next level of care: Skilled Nursing Facility Barriers to Discharge: Barriers Resolved   Patient Goals and CMS Choice Patient states their goals for this hospitalization and ongoing recovery are:: SNF CMS Medicare.gov Compare Post Acute Care list provided to:: Patient Represenative (must comment) Choice offered to / list presented to : Sibling [place] ownership interest in [facility].provided to:: Sibling    Discharge Placement              Patient chooses bed at: [facility] Patient to be transferred to facility by: PTAR Name of family member notified: [name] Patient and family notified of of transfer: 04/16/24  Discharge Plan and Services Additional resources added to the After Visit Summary for   In-house Referral: Clinical Social Work   Post Acute Care Choice: Skilled Nursing Facility                               Social Drivers of Health (SDOH) Interventions SDOH Screenings   Food Insecurity: Patient Unable To Answer (03/25/2024)  Housing: Unknown (03/25/2024)  Transportation Needs: Patient Unable To Answer (03/25/2024)  Utilities: Patient Unable To Answer (03/25/2024)  Depression (PHQ2-9): Low Risk  (12/10/2023)  Tobacco Use: Medium Risk (03/29/2024)     Readmission Risk Interventions     No data to display

## 2024-04-16 NOTE — Progress Notes (Signed)
 OT Cancellation Note  Patient Details Name: MUAD NOGA MRN: [id number] DOB: 08/21/1961   Cancelled Treatment:    Reason Eval/Treat Not Completed: Patient declined, no reason specified.  Pt trying to get OOB by himself per RN, but by the time OT entered room, pt reports he is tired and does not want to get up.   Kennth Mliss Helling 04/16/2024, 9:37 AM Mliss HERO, OTR/L Acute Rehabilitation Services Office: [phone]

## 2024-04-16 NOTE — Discharge Summary (Signed)
 John [name] FMW:[id number] DOB: [birth year] DOA: 03/24/2024  PCP: [name], MD  Admit date: 03/24/2024  Discharge date: 04/16/2024  Admitted From: Home   Disposition:  SNF   Recommendations for Outpatient Follow-up:   Follow up with PCP in 1-2 weeks  PCP Please obtain BMP/CBC, 2 view CXR in 1week,  (see Discharge instructions)   PCP Please follow up on the following pending results:    Home Health: None Equipment/Devices: None Consultations: Neurology, neurosurgery Discharge Condition: Stable    CODE STATUS: Full Diet Recommendation: Heart Healthy       Chief Complaint  Patient presents with   Fall     Brief history of present illness from the day of admission and additional interim summary    63 y.o.  male with history of Parkinson's disease, recent PE on anticoagulation-presented with recurrent falls-confusion.  Underwent multiple attempts for MRI imaging of brain/spine-but due to lack of patient cooperation-poor study due to significant motion artifact, subsequently underwent MRI under anesthesia-which showed SDH and severe C-spine stenosis.   Significant events: 7/9>> admit to [facility]   Significant studies: 7/8>> CXR: No PNA 7/8>> x-ray pelvis: No fracture.  7/8>> CT head: No acute intracranial abnormality 7/8>> CT maxillofacial: Redemonstrated fractures involving the right maxillary sinus/orbit-stable since 6/25.  No new fractures. 7/8>> CT C-spine: No fracture. 7/9>> x-ray left knee: No fracture. 7/9>> CT head: No acute intracranial abnormality. 7/9>> CT C-spine: No acute fracture. 7/10>> MRI C-spine/T-spine/L-spine: Motion artifact-limited study. 7/14>> MRI brain: SDH overlying bilateral cerebral convexities. 7/14>> MRI C-spine: Severe spinal canal stenosis 7/14>> MRI T-spine: No signal  abnormality within the thoracic spinal cord. 7/14>> MRI L-spine: Acute fracture of the S3 sacral segment (demonstrated on prior CT 6/25).  Lumbar spondylosis. 7/14>> CT head: Stable subacute to chronic 6 mm right and chronic 4 mm left subdural hematoma. 7/15-7/16>> LTM EEG: No seizures. 7/16-7/17>> LTM EEG: No seizures. 7/21>> CT head: Small SDH-stable/regressed from prior study   Significant microbiology data: 7/9>> blood culture:                                                                     Hospital Course   Frequent falls with right cheek hematoma, left forehead laceration (sutured 7/8 in the ED) right maxillary sinus and orbit fracture (present prior to this admit), S3 sacral segment fracture (present prior to this admit) Likely related to gait instability/autonomic dysfunction in the setting of Parkinson's disease-along with degenerative disc disease/C-spine stenosis. PT/OT eval SNF recommended -awaiting SNF bed-see discussion below. Sutures removed 7/20   SDH Traumatic etiology Discussed with neurosurgeon-Dr. [name] 7/22-since repeat CT head on 7/21 with minimal residual SDH-tolerated IV heparin  for several days-has been subsequently transitioned to Eliquis , post discharge follow-up with neurosurgery in 1 to 2 weeks.   Cervical  spinal stenosis Neurosurgery followed closely-exam remains stable Discussed with Dr. [name] 7/22-he prefers to have patient continue to rehab-and be several months out from most recent PE before considering decompression surgery. Follow-up with neurosurgery-Dr. [name] post discharge.   Acute metabolic encephalopathy Felt to be multifactorial-felt to be secondary to progressive Parkinson's disease related dementia-superimposed on potential postconcussive syndrome/SDH/borderline vitamin B12 deficiency/UTI Mental status has gradually improved-he is mostly awake and alert though at times he does have waxing/waning confusion. LTM EEG x 48 hours negative for  seizures Neurology followed initially but has subsequently signed off.  Mentation is much improved, he is minimally confused at times, overall pleasant.   Complicated UTI Clinically resolved he has finished empiric course of IV Rocephin .   Parkinson's disease Continue Sinemet    Orthostatic hypotension With relatively high supine blood pressures, placed on midodrine , TED stockings and abdominal binder as needed, use walker and assistance at all times remains high fall risk, he likely has advanced autonomic dysfunction due to underlying Parkinson's and will have unrelenting orthostatic hypotension, sister and patient both informed, continue to monitor.   Recent pulmonary embolism (March 10, 2024) Switched from IV heparin  to Eliquis  on 7/22. Difficult situation-significant falls recently even while at SNF-ongoing discussions by [facility] team-with SNF to see if we can arrange for prevention plan.   BPH with acute urinary retention Failed voiding trial-Foley catheter reinserted 7/13 Outpatient follow-up with urology follow-up in 1 to 2 weeks post DC-will discharged with Foley catheter Borderline vitamin B12 deficiency Being supplemented.   B6 deficiency Continue supplementation.   Hypokalemia and hypophosphatemia.  Replaced.       Class 1 Obesity: Estimated body mass index is 33.4 kg/m with PCP for weight loss.    Discharge diagnosis     Principal Problem:   Frequent falls Active Problems:   Laceration of head   Urinary tract infection   Acute metabolic encephalopathy   Hypomagnesemia   History of pulmonary embolism   Parkinson's disease with fluctuating manifestations (HCC)   BPH with urinary obstruction   Obesity (BMI 30-39.9)    Discharge instructions    Discharge Instructions     Diet - low sodium heart healthy   Complete by: As directed    Discharge instructions   Complete by: As directed    Follow with Primary MD [name], MD in 7 days   Get CBC, CMP,  Magnesium , 2 view Chest X ray -  checked next visit with your primary MD or SNF MD    Activity: As tolerated with Full fall precautions use walker/cane & assistance as needed, high fall risk, chronic orthostatic hypotension.  Disposition SNF  Diet: Heart Healthy   Special Instructions: If you have smoked or chewed Tobacco  in the last 2 yrs please stop smoking, stop any regular Alcohol  and or any Recreational drug use.  On your next visit with your primary care physician please Get Medicines reviewed and adjusted.  Please request your Prim.MD to go over all Hospital Tests and Procedure/Radiological results at the follow up, please get all Hospital records sent to your Prim MD by signing hospital release before you go home.  If you experience worsening of your admission symptoms, develop shortness of breath, life threatening emergency, suicidal or homicidal thoughts you must seek medical attention immediately by calling 911 or calling your MD immediately  if symptoms less severe.  You Must read complete instructions/literature along with all the possible adverse reactions/side effects for all the Medicines you take and that  have been prescribed to you. Take any new Medicines after you have completely understood and accpet all the possible adverse reactions/side effects.   Do not drive when taking Pain medications.  Do not take more than prescribed Pain, Sleep and Anxiety Medications  Wear Seat belts while driving.   No wound care   Complete by: As directed        Discharge Medications   Allergies as of 04/16/2024       Reactions   Flomax  [tamsulosin  Hcl] Anxiety, Other (See Comments)   Jittery         Medication List     STOP taking these medications    cephALEXin  500 MG capsule Commonly known as: KEFLEX        TAKE these medications    acetaminophen  500 MG tablet Commonly known as: TYLENOL  Take 1 tablet (500 mg total) by mouth every 6 (six) hours as needed for mild  pain (pain score 1-3), moderate pain (pain score 4-6) or headache.   apixaban  5 MG Tabs tablet Commonly known as: ELIQUIS  Take 2 tabs (10 mg total) twice a day through 03/18/2024.  From 03/19/2024, start taking 1 tab (5 Mg total) twice daily.   carbidopa -levodopa  50-200 MG tablet Commonly known as: SINEMET  CR Take 1 tablet by mouth 6 (six) times daily. (4-5 AM, 8 AM, 11 AM, 2 PM, 5 PM, 8 PM).   cyanocobalamin  1000 MCG tablet Take 1 tablet (1,000 mcg total) by mouth daily.   HYDROcodone -acetaminophen  5-325 MG tablet Commonly known as: NORCO/VICODIN Take 1 tablet by mouth every 8 (eight) hours as needed for severe pain (pain score 7-10).   midodrine  5 MG tablet Commonly known as: PROAMATINE  Take 1 tablet (5 mg total) by mouth 2 (two) times daily with a meal.   multivitamin with minerals Tabs tablet Take 1 tablet by mouth daily.   omeprazole  20 MG tablet Commonly known as: PRILOSEC  OTC Take 20 mg by mouth daily.   pyridOXINE  50 MG tablet Commonly known as: B-6 Take 1 tablet (50 mg total) by mouth daily. Start taking on: April 17, 2024   thiamine  100 MG tablet Commonly known as: Vitamin B-1 Take 1 tablet (100 mg total) by mouth daily. Start taking on: April 17, 2024         Contact information for follow-up providers     [name], MD. Schedule an appointment as soon as possible for a visit in 1 week(s).   Specialty: Family Medicine Contact information: [address] [place] [address] [phone]         [name] Alm Hamilton, MD. Schedule an appointment as soon as possible for a visit in 2 week(s).   Specialty: Neurosurgery Why: Hospital follow up Contact information: [address] [place] [address] [phone]              Contact information for after-discharge care     Destination     [name] .   Service: Skilled Nursing Contact information: [address] [place]   [phone]                     Major procedures and Radiology Reports - PLEASE review detailed and final reports thoroughly  -      CT HEAD WO CONTRAST ( ) Result Date: 04/05/2024 CLINICAL DATA:  63 year old male with bilateral subdural hematoma on MRI. Recent fall. EXAM: CT HEAD WITHOUT CONTRAST TECHNIQUE: Contiguous axial images were obtained from the base of the  skull through the vertex without intravenous contrast. RADIATION DOSE REDUCTION: This exam was performed according to the departmental dose-optimization program which includes automated exposure control, adjustment of the mA and/or kV according to patient size and/or use of iterative reconstruction technique. COMPARISON:  Head CT and MRI 03/29/2024 and earlier. FINDINGS: Brain: Small right greater than left superior convexity subdural collections have not progressed since 03/29/2024, 1-2 mm on the left by CT, now 2-3 mm on the right (previously 5-6 mm on coronal CT). No midline shift. No new intracranial hemorrhage identified. Stable gray-white matter differentiation throughout the brain. No cortically based acute infarct identified. Normal basilar cisterns. 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 Vascular: No suspicious intracranial vascular hyperdensity. Calcified atherosclerosis at the skull base. Skull: Appear stable and intact. Sinuses/Orbits: Regressed paranasal sinus opacification, improved aeration. Tympanic cavities and mastoids well aerated. Other: No acute orbit or scalp soft tissue finding. Right face zygoma subcutaneous hematoma or contusion redemonstrated. IMPRESSION: 1. Small right greater than left superior convexity Subdural Hematomas are stable to regressed since 03/29/2024, now up to 2-3 mm maximal thickness on the right side. No midline shift. 2. No new intracranial abnormality. 3. Right face subcutaneous hematoma or contusion redemonstrated. Electronically Signed   By: VEAR Hurst M.D.   On: 04/05/2024 06:01   Overnight EEG with video Result Date: 03/31/2024 [name], MD     04/01/2024 10:41 AM Patient Name: John [name] MRN: [id number] Epilepsy Attending: Arlin KIDD Shelton Referring Physician/Provider: everitt Clint Abbey Earle FORBES, NP Duration: 03/30/2024 9043 to 03/31/2024 9043  Patient history:  63 y.o. male with a past medical history of parkinson's disease, HTN, recent PE now on eliquis  presenting after multiple falls and altered mental status. EEG to evaluate for seizure  Level of alertness: Awake, asleep  AEDs during EEG study: None Technical aspects: This EEG study was done with scalp electrodes positioned according to the 10-20 International system of electrode placement. Electrical activity was reviewed with band pass filter of 1-70Hz , sensitivity of 7 uV/mm, display speed of 40mm/sec with a 60Hz  notched filter applied as appropriate. EEG data were recorded continuously and digitally stored.  Video monitoring was available and reviewed as appropriate.  Description: The posterior dominant rhythm consists of 8 Hz activity of moderate voltage (25-35 uV) seen predominantly in posterior head regions, symmetric and reactive to eye opening and eye closing. Sleep was characterized by sleep spindles (12-14hz ), maximum frontocentral region. EEG showed intermittent generalized 3 to paresthesia. ABNORMALITY - Intermittent slow, generalized IMPRESSION: This study is suggestive of mild diffuse encephalopathy.  No seizures or epileptiform discharges were seen throughout the recording.  Priyanka KIDD Shelton   CT HEAD WO CONTRAST ( ) Result Date: 03/29/2024 CLINICAL DATA:  Subdural hematoma on MRI, will need to compare with recent CTs to evaluate for chronicity. EXAM: CT HEAD WITHOUT CONTRAST TECHNIQUE: Contiguous axial images were obtained from the base of the skull through the vertex without intravenous contrast. RADIATION DOSE REDUCTION: This exam was performed according to the  departmental dose-optimization program which includes automated exposure control, adjustment of the mA and/or kV according to patient size and/or use of iterative reconstruction technique. COMPARISON:  CT head 03/25/2024, MRI head 03/29/2024, CT max face 03/24/2024, CT head and max face 03/10/2024 FINDINGS: Brain: No evidence of large-territorial acute infarction. No parenchymal hemorrhage. No mass lesion. Stable hypodense 6 mm right calvarial convexity extra-axial fluid collection. Stable poorly visualized hypodense 4 mm left calvarial convexity extra-axial fluid collection. No mass effect  or midline shift. No hydrocephalus. Basilar cisterns are patent. Vascular: No hyperdense vessel. Skull: Subacute to chronic right anterior and posterolateral maxillary sinus wall fracture. Sinuses/Orbits: Heterogeneous right maxillary mucosal thickening. Right sphenoid sinus mucosal thickening. Otherwise paranasal sinuses and mastoid air cells are clear. The orbits are unremarkable. Other: Chronic right maxillary soft tissue 1.6 x 0.7 cm fluid collection. IMPRESSION: 1. No acute intracranial hemorrhage. 2. Stable subacute to chronic 6 mm right and chronic 4 mm left subdural hematoma. 3. Chronic right maxillary soft tissue 1.6 x 0.7 cm fluid collection. 4. Subacute to chronic right anterior and posterolateral maxillary sinus wall fracture. Associated blood products within the right maxillary sinus. Electronically Signed   By: Morgane  Naveau M.D.   On: 03/29/2024 17:44   MR THORACIC SPINE W WO CONTRAST Addendum Date: 03/29/2024 ADDENDUM REPORT: 03/29/2024 17:37 ADDENDUM: Dextrocurvature of the thoracic spine. Electronically Signed   By: Rockey Childs D.O.   On: 03/29/2024 17:37   Result Date: 03/29/2024 CLINICAL DATA:  Provided history: Parkinson's disease. Multiple falls. EXAM: MRI THORACIC WITHOUT AND WITH CONTRAST TECHNIQUE: Multiplanar and multiecho pulse sequences of the thoracic spine were obtained without and with  intravenous contrast. CONTRAST:  10mL GADAVIST  GADOBUTROL  1 MMOL/ML IV SOLN COMPARISON:  Thoracic spine CT 03/26/2024. FINDINGS: Alignment: No significant spondylolisthesis. Vertebrae: Mild chronic anterior wedge deformities of the T1, T7, T11 and T12 vertebrae. Degenerative appearing edema and enhancement within the T12 vertebral body anteriorly (adjacent to a bulky T11-T12 ventral osteophyte). No significant marrow edema or focal worrisome marrow lesion identified elsewhere. Multilevel ventrolateral osteophytes/syndesmophytes, many of which are bridging. Cord: No signal abnormality identified within the thoracic spinal cord. No pathologic spinal cord enhancement. Paraspinal and other soft tissues: Partially imaged bilateral pleural effusions. No paraspinal mass or collection. Disc levels: Multilevel disc degeneration, greatest at T6-T7, T7-T8, T8-T9 and T10-T11 (moderate-to-advanced with possible early bridging osseous fusion across the disc spaces at these levels). Multilevel shallow disc bulges, facet arthropathy and ligamentum flavum hypertrophy. Small central disc protrusion at T1-T2. No more than mild relative spinal canal narrowing. Facet arthropathy contributes to moderate left neural foraminal narrowing at T3-T4. IMPRESSION: 1. No signal abnormality identified within the thoracic spinal cord. No pathologic spinal cord enhancement. 2. Mild chronic anterior wedge deformities of the T1, T7, T11 and T12 vertebrae. 3. Thoracic spondylosis as outlined within the body of the report. No more than mild relative spinal canal narrowing. Moderate left neural foraminal narrowing at T3-T4. Disc degeneration is greatest at T6-T7, T7-T8, T8-T9 and T9-T10 (moderate-to-advanced, with possible early bridging osseous fusion across the disc spaces, at these levels). 4. Degenerative appearing edema and enhancement within the T12 vertebral body anteriorly (adjacent to a bulky T11-T12 ventral osteophyte). 5. Multilevel  ventrolateral osteophytes/syndesmophytes, many of which are bridging. 6. Partially imaged bilateral pleural effusions. Electronically Signed: By: Rockey Childs D.O. On: 03/29/2024 17:16   MR Lumbar Spine W Wo Contrast Result Date: 03/29/2024 CLINICAL DATA:  Provided history: Parkinson's disease. Multiple falls. EXAM: MRI LUMBAR SPINE WITHOUT AND WITH CONTRAST TECHNIQUE: Multiplanar and multiecho pulse sequences of the lumbar spine were obtained without and with intravenous contrast. CONTRAST:  10mL GADAVIST  GADOBUTROL  1 MMOL/ML IV SOLN COMPARISON:  Report from lumbar spine MRI 03/26/2024 (images currently unavailable). CT chest/abdomen/pelvis 03/10/2024. FINDINGS: Segmentation: 5 lumbar vertebrae. The caudal most well-formed intervertebral disc space is designated L5-S1. Alignment: Slight L1-L2 grade 1 retrolisthesis. 3 mm L2-L3 grade 1 retrolisthesis. Slight L3-L4 grade 1 retrolisthesis. 3 mm L5-S1 grade 1 retrolisthesis. Vertebrae: Partially imaged sacral edema  and enhancement. An acute fracture of the S3 sacral segment was present on the prior CT chest/abdomen/pelvis 03/10/2024. No lumbar vertebral compression fracture. No significant marrow edema or focal worrisome marrow lesion within the lumbar spine. Multilevel Schmorl nodes. Bulky multilevel ventrolateral osteophytes (some bridging). Conus medullaris and cauda equina: Conus extends to the L1-L2 level. No signal abnormality identified within the visualized distal spinal cord. No pathologic enhancement of the visualized distal spinal cord. Paraspinal and other soft tissues: No acute finding within included portions of the abdomen/retroperitoneum. No paraspinal mass or collection. Disc levels: Multilevel disc degeneration, greatest at L5-S1 (moderate at this level). T12-L1: Grade 1 retrolisthesis. Small disc bulge. Facet arthropathy and ligamentum flavum hypertrophy. No significant spinal canal or foraminal stenosis. L1-L2: Grade 1 retrolisthesis. Small disc  bulge. Facet arthropathy and ligamentum flavum hypertrophy. No significant spinal canal or foraminal stenosis. L2-L3: Grade 1 retrolisthesis. Disc bulge. Superimposed broad-based right foraminal disc protrusion. Facet arthropathy and ligamentum flavum hypertrophy. Mild relative right subarticular narrowing. No significant central canal stenosis. Mild relative right inferior neural foraminal narrowing. L3-L4: Grade 1 retrolisthesis. Disc bulge. Superimposed broad-based right foraminal disc protrusion. Facet and ligamentum flavum hypertrophy. Mild relative right subarticular narrowing. No significant central canal stenosis. Mild relative right inferior neural foraminal narrowing. L4-L5: Posterior annular fissure. Disc bulge with endplate spurring. Facet arthropathy and ligamentum flavum hypertrophy. Mild relative right subarticular narrowing. No significant central canal stenosis. Mild bilateral neural foraminal narrowing (greater on the left). L5-S1: Grade 1 retrolisthesis. Disc bulge with endplate spurring. Facet arthropathy with ligamentum flavum hypertrophy. No significant spinal canal stenosis. Bilateral neural foraminal narrowing (mild moderate right, moderate left). IMPRESSION: 1. Partially imaged sacral edema and enhancement. An acute fracture of the S3 sacral segment was demonstrated on the prior CT chest/abdomen/pelvis of 03/10/2024. 2. Lumbar spondylosis as outlined within the body of the report. No significant central canal stenosis. No more than mild subarticular narrowing. Multilevel foraminal stenosis, greatest bilaterally at L5-S1 (mild-to-moderate right, moderate left). 3. Bulky multilevel ventrolateral osteophytes (some bridging). 4. Levocurvature of the lumbar spine. 5. Mild grade 1 retrolisthesis at T12-L1, L1-L2, L2-L3, L3-L4 and L5-S1. Electronically Signed   By: Rockey Childs D.O.   On: 03/29/2024 17:35   MR CERVICAL SPINE W WO CONTRAST Result Date: 03/29/2024 CLINICAL DATA:  Provided history:  Parkinson's disease, multiple falls. EXAM: MRI CERVICAL SPINE WITHOUT AND WITH CONTRAST TECHNIQUE: Multiplanar and multiecho pulse sequences of the cervical spine, to include the craniocervical junction and cervicothoracic junction, were obtained without and with intravenous contrast. CONTRAST:  10mL GADAVIST  GADOBUTROL  1 MMOL/ML IV SOLN COMPARISON:  Cervical spine MRI 03/25/2024. Cervical spine CT 03/25/2024. FINDINGS: Alignment: Levocurvature of the cervical spine. Slight grade retrolisthesis at C3-C4. Slight grade 1 anterolisthesis at C4-C5. Vertebrae: Cervical vertebral body height is maintained. Mild patchy marrow edema within the posterior elements, likely degenerative and related to facet arthropathy. No significant marrow edema or focal worrisome marrow lesion identified elsewhere. Multilevel ventral osteophytes, including a bulky bridging ventral osteophyte at C3-C4, bulky ventral osteophyte at C4-C5 and somewhat bulky ventral osteophyte at C7-T1. Cord: Spinal cord flattening and apparent small focus of signal abnormality within the right aspect of the spinal cord at C3-C4. Posterior Fossa, vertebral arteries, paraspinal tissues: Intracranial findings as described on same-day brain MRI. Flow voids preserved within the visible cervical vertebral arteries. No paraspinal mass or collection. Disc levels: Suspected bridging osseous fusion across the C5-C6 disc space. Disc degeneration disc degeneration at the remaining cervical levels, greatest at C6-C7 (moderate at this level). C2-C3:  Disc bulge. Facet arthropathy (greater on the right). Ligamentum flavum hypertrophy. No significant spinal canal stenosis or neural foraminal narrowing. C3-C4: Bulky posterior disc osteophyte complex with bilateral disc osteophyte ridge/uncinate hypertrophy. 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 Spinal  canal stenosis with spinal cord flattening. Most notably, there is severe stenosis of the right aspect of the spinal canal with spinal cord flattening. Apparent small focus of T2 hyperintense signal abnormality within the right aspect of the spinal cord, which may reflect focal edema and/or myelomalacia (series 16, image 19). Bilateral neural foraminal narrowing (severe right, moderate-to-severe left). C4-C5: Small central disc protrusion. Facet arthropathy (greater on the right). The disc protrusion results in mild spinal canal stenosis. Mild right neural foraminal narrowing. C5-C6: Suspected bridging osseous fusion across the disc space. Facet arthropathy on the left. No significant spinal canal or foraminal stenosis. C6-C7: Shallow disc bulge. Uncovertebral hypertrophy (greater on the left). Mild facet arthropathy. No significant spinal canal stenosis. Moderate-to-severe left neural foraminal narrowing. C7-T1: Mild to moderate facet arthropathy. No significant disc herniation or stenosis. Perineural cyst within the left neural foramen. Impression #1 will be called to the ordering clinician or representative by the Radiologist Assistant, and communication documented in the PACS or [employer]. IMPRESSION: 1. Cervical spondylosis as outlined within the body of the report. 2. 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 Most notably, there is severe stenosis of the right aspect of the spinal canal with spinal cord flattening. Additionally, there is an apparent small focus of signal abnormality with right aspect of the spinal cord at this level, which may reflect focal edema and/or myelomalacia. Bilateral neural foraminal narrowing (severe right, moderate-to-severe left). 3. No more than mild spinal canal stenosis at the remaining levels. 4. Additional sites of foraminal stenosis, greatest on the left at C6-C7 (moderate-to-severe at  this site). 5. Mild patchy edema within the posterior elements, likely degenerative and related to facet arthropathy. 6. Suspected bridging osseous fusion across the C5-C6 disc space. 7. Bulky bridging ventral osteophyte at C3-C4. 8. Levocurvature of the cervical spine. 9. Mild grade 1 spondylolisthesis at C3-C4 and C4-C5. Electronically Signed   By: Rockey Childs D.O.   On: 03/29/2024 16:59   MR BRAIN W WO CONTRAST Result Date: 03/29/2024 CLINICAL DATA:  Provided history: Progressive encephalopathy, underlying Parkinson's disease, possible dementia, recent fall with head trauma, rapid decline in mental status over the past few weeks. EXAM: MRI HEAD WITHOUT AND WITH CONTRAST TECHNIQUE: Multiplanar, multiecho pulse sequences of the brain and surrounding structures were obtained without and with intravenous contrast. CONTRAST:  10 mL Gadavist  intravenous contrast. COMPARISON:  Prior head CT examinations 03/24/2024 and earlier. FINDINGS: Brain: Subdural collections (containing small internal foci of susceptibility-weighted signal loss) overlying the bilateral cerebral convexities, consistent with subdural hematomas. The hematomas measure up to 6 mm in thickness on the right and 4 mm in thickness on the left. Balanced mass effect upon the underlying cerebral hemispheres without midline shift. Mild diffuse dural thickening enhancement along the bilateral cerebral convexities, and along the falx, nonspecific and possibly related to the subdural hematomas. Mild multifocal T2 FLAIR hyperintense signal abnormality within the cerebral white matter, nonspecific but compatible chronic small vessel ischemic disease. 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 Small cluster of chronic microhemorrhages within the anterior left  frontal lobe. There is no acute infarct. No evidence of an intracranial mass. Vascular: Maintained flow voids within the proximal large  arterial vessels. Skull and upper cervical spine: No focal worrisome marrow lesion. Sinuses/Orbits: No mass or acute finding within the imaged orbits. Severe right maxillary sinusitis. Moderate right sphenoid sinusitis. Other: Small-volume fluid within right mastoid air cells. Right facial hematoma. Impression #1 called by telephone at the time of interpretation on 03/29/2024 at 4:29 pm to provider DAWOOD [facility] , who verbally acknowledged these results. IMPRESSION: 1. Subdural hematomas overlying the bilateral cerebral convexities (measuring up to 6 mm in thickness on the right and 4 mm in thickness on the left). Consider a repeat head CT to assess for any acute hemorrhage within the hematomas. Balanced mass effect upon the underlying cerebral hemispheres without midline shift. 2. Mild diffuse dural thickening and enhancement overlying the bilateral cerebral convexities, and along the falx, nonspecific but possibly related to the subdural hematomas. 3. Small cluster of chronic microhemorrhages within the anterior left frontal lobe. 4. Mild chronic small vessel ischemic changes within the cerebral white matter. 5. Paranasal sinus disease as described. 6. Small-volume fluid within right mastoid air cells. Electronically Signed   By: Rockey Childs D.O.   On: 03/29/2024 16:30   MR LUMBAR SPINE WO CONTRAST Result Date: 03/26/2024 EXAM: MRI THORACIC AND LUMBAR SPINE WITHOUT INTRAVENOUS CONTRAST 03/26/2024 01:01:00 PM TECHNIQUE: Multiplanar multisequence MRI of the thoracic and lumbar spine was performed without the administration of intravenous contrast. COMPARISON: None available. CLINICAL HISTORY: Myelopathy, acute, thoracic spine. Only got Sag sequences on pt. No axials obtained, pt beating on scanner and tripping the table to where it would not run sequences. Pt very uncooperative, RN gave max amount of meds. Pt trying to come off scan table multiple times. Pt needs anes. For further MRI FINDINGS: BONES AND  ALIGNMENT: Normal thoracic spine alignment. Mild anterior height loss of the T7 vertebral body likely chronic and degenerative. Normal lumbar vertebral body heights. 3 mm retrolisthesis of L2 on L3. SPINAL CORD: Within limits of motion artifact and sagittal only imaging, no abnormal spinal cord signal. Conus terminates at approximately T12-L1. SOFT TISSUES: Unremarkable. THORACIC DISC LEVELS: Multilevel thoracic facet arthropathy. No significant disc herniation or spinal canal stenosis in the thoracic spine. At T3-4 there is moderate bilateral neuroforaminal narrowing. LUMBAR DISC LEVELS: Severely limited exam with significant motion artifact and sagittal only acquisition limiting evaluation for degenerative changes and stenosis. Within this limitation: L2-L3: Retrolisthesis, disc bulge and facet arthropathy contribute to likely mild spinal canal stenosis. L3-L4: Modic type 2 degenerative endplate marrow signal changes. No significant disc herniation. L4-L5: Modic type 2 degenerative endplate marrow signal changes. Small central disc protrusion. L5-S1: Left greater than right facet arthropathy contributes to at least moderate left neuroforaminal narrowing. IMPRESSION: 1. Severely limited exam with significant motion artifact and sagittal only acquisition. Patient should return for repeat imaging after anesthesia consultation. 2. Multilevel thoracic facet arthropathy, notable for moderate bilateral neural foraminal narrowing at T3-4. 3. Likely mild spinal canal stenosis at L2-3. 4. At least moderate left neuroforaminal narrowing at L5-S1 . Electronically signed by: Ryan Chess MD 03/26/2024 01:20 PM EDT RP Workstation: [id number]   MR THORACIC SPINE WO CONTRAST Result Date: 03/26/2024 EXAM: MRI THORACIC AND LUMBAR SPINE WITHOUT INTRAVENOUS CONTRAST 03/26/2024 01:01:00 PM TECHNIQUE: Multiplanar multisequence MRI of the thoracic and lumbar spine was performed without the administration of intravenous contrast.  COMPARISON: None available. CLINICAL HISTORY: Myelopathy, acute, thoracic spine. Only got Sag sequences on pt.  No axials obtained, pt beating on scanner and tripping the table to where it would not run sequences. Pt very uncooperative, RN gave max amount of meds. Pt trying to come off scan table multiple times. Pt needs anes. For further MRI FINDINGS: BONES AND ALIGNMENT: Normal thoracic spine alignment. Mild anterior height loss of the T7 vertebral body likely chronic and degenerative. Normal lumbar vertebral body heights. 3 mm retrolisthesis of L2 on L3. SPINAL CORD: Within limits of motion artifact and sagittal only imaging, no abnormal spinal cord signal. Conus terminates at approximately T12-L1. SOFT TISSUES: Unremarkable. THORACIC DISC LEVELS: Multilevel thoracic facet arthropathy. No significant disc herniation or spinal canal stenosis in the thoracic spine. At T3-4 there is moderate bilateral neuroforaminal narrowing. LUMBAR DISC LEVELS: Severely limited exam with significant motion artifact and sagittal only acquisition limiting evaluation for degenerative changes and stenosis. Within this limitation: L2-L3: Retrolisthesis, disc bulge and facet arthropathy contribute to likely mild spinal canal stenosis. L3-L4: Modic type 2 degenerative endplate marrow signal changes. No significant disc herniation. L4-L5: Modic type 2 degenerative endplate marrow signal changes. Small central disc protrusion. L5-S1: Left greater than right facet arthropathy contributes to at least moderate left neuroforaminal narrowing. IMPRESSION: 1. Severely limited exam with significant motion artifact and sagittal only acquisition. Patient should return for repeat imaging after anesthesia consultation. 2. Multilevel thoracic facet arthropathy, notable for moderate bilateral neural foraminal narrowing at T3-4. 3. Likely mild spinal canal stenosis at L2-3. 4. At least moderate left neuroforaminal narrowing at L5-S1 . Electronically  signed by: Ryan Chess MD 03/26/2024 01:20 PM EDT RP Workstation: [id number]   MR CERVICAL SPINE WO CONTRAST Result Date: 03/25/2024 CLINICAL DATA:  Initial evaluation for acute myelopathy. EXAM: MRI CERVICAL SPINE WITHOUT CONTRAST TECHNIQUE: Multiplanar, multisequence MR imaging of the cervical spine was performed. No intravenous contrast was administered. COMPARISON:  CT from earlier the same day FINDINGS: Alignment: Examination technically limited as the patient was unable to tolerate the full length of the exam. Sagittal imaging only was performed. No axial sequences were obtained. Additionally, provided images are degraded by motion artifact. Straightening of the normal cervical lordosis. Trace degenerative retrolisthesis of C3 on C4, with trace anterolisthesis of C6 on C7. Vertebrae: Vertebral body height maintained without acute or chronic fracture. Bone marrow signal intensity overall within normal limits. No visible worrisome osseous lesions. Suspected mild reactive marrow edema about the C3-4 facets due to facet arthritis. No other abnormal marrow edema. Cord: Grossly normal signal and morphology on this limited motion degraded exam. Posterior Fossa, vertebral arteries, paraspinal tissues: No visible abnormality on this limited exam. Disc levels: C2-C3: Mild disc bulge. Mild facet hypertrophy. No significant stenosis. C3-C4: Diffuse disc bulge with bilateral uncovertebral spurring. Mild to moderate spinal stenosis. Foramina not well assessed on this exam. C4-C5: Diffuse disc bulge with uncovertebral spurring. No significant spinal stenosis. Foramina appear grossly patent. C5-C6: Mild disc bulge with endplate and uncovertebral spurring. No significant stenosis. C6-C7: Degenerative intervertebral disc space narrowing with diffuse disc osteophyte complex. No significant spinal stenosis. No more than mild bilateral foraminal stenosis. C7-T1: Mild disc bulge. Bilateral facet arthrosis. No significant  canal or foraminal stenosis. IMPRESSION: 1. Technically limited exam due to the patient's inability to tolerate the study. Sagittal imaging only was performed. No axial sequences were obtained. Additionally, provided images are degraded by motion. 2. Grossly normal MRI appearance of the cervical spinal cord on this limited exam. 3. Multilevel cervical spondylosis with resultant mild to moderate spinal stenosis at C3-4. No other significant spinal  stenosis. Evaluation of the neural foramina limited on this truncated exam. Electronically Signed   By: Morene Hoard M.D.   On: 03/25/2024 21:36   CT CERVICAL SPINE WO CONTRAST Result Date: 03/25/2024 CLINICAL DATA:  Multiple falls EXAM: CT CERVICAL SPINE WITHOUT CONTRAST TECHNIQUE: Multidetector CT imaging of the cervical spine was performed without intravenous contrast. Multiplanar CT image reconstructions were also generated. RADIATION DOSE REDUCTION: This exam was performed according to the departmental dose-optimization program which includes automated exposure control, adjustment of the mA and/or kV according to patient size and/or use of iterative reconstruction technique. COMPARISON:  None Available. FINDINGS: Alignment: Normal Skull base and vertebrae: No acute fracture. No primary bone lesion or focal pathologic process. Soft tissues and spinal canal: No prevertebral fluid or swelling. No visible canal hematoma. Disc levels: Diffuse cervical spondylosis. 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 Multilevel bilateral neural foraminal narrowing due to facet arthropathy. Upper chest: No acute findings Other: None IMPRESSION: No acute bony abnormality. Cervical spondylosis. Electronically Signed   By: Franky Crease M.D.   On: 03/25/2024 00:11   CT HEAD WO CONTRAST ( ) Result Date: 03/25/2024 CLINICAL DATA:  Multiple falls. EXAM: CT HEAD WITHOUT  CONTRAST TECHNIQUE: Contiguous axial images were obtained from the base of the skull through the vertex without intravenous contrast. RADIATION DOSE REDUCTION: This exam was performed according to the departmental dose-optimization program which includes automated exposure control, adjustment of the mA and/or kV according to patient size and/or use of iterative reconstruction technique. COMPARISON:  03/24/2024 FINDINGS: Brain: No acute intracranial abnormality. Specifically, no hemorrhage, hydrocephalus, mass lesion, acute infarction, or significant intracranial injury. Patchy chronic small vessel disease throughout the deep white matter. Vascular: No hyperdense vessel or unexpected calcification. Skull: No acute calvarial abnormality. Sinuses/Orbits: Opacification of the right maxillary sinus. Fracture through the anterior wall the right maxillary sinus again noted, unchanged. Other: None IMPRESSION: No acute intracranial abnormality. Patchy chronic small vessel disease throughout the deep white matter. Electronically Signed   By: Franky Crease M.D.   On: 03/25/2024 00:09   EEG adult Result Date: 03/24/2024 [name], MD     03/31/2024  9:14 AM Patient Name: John [name] MRN: [id number] Epilepsy Attending: Arlin KIDD Shelton Referring Physician/Provider: Remi Pippin, NP Date: 03/24/2024 Duration: 23.25 mins Patient history:  63 y.o. male with a past medical history of parkinson's disease, HTN, recent PE now on eliquis  presenting after multiple falls and altered mental status. EEG to evaluate for seizure  Level of alertness: Awake AEDs during EEG study: Ativan  Technical aspects: This EEG study was done with scalp electrodes positioned according to the 10-20 International system of electrode placement. Electrical activity was reviewed with band pass filter of 1-70Hz , sensitivity of 7 uV/mm, display speed of 28mm/sec with a 60Hz  notched filter applied as appropriate. EEG data were recorded continuously and  digitally stored.  Video monitoring was available and reviewed as appropriate. Description: The posterior dominant rhythm consists of 8 Hz activity of moderate voltage (25-35 uV) seen predominantly in posterior head regions, symmetric and reactive to eye opening and eye closing. Hyperventilation and photic stimulation were not performed.    IMPRESSION: This study is within normal limits. No seizures or epileptiform discharges were seen throughout the recording.  A normal interictal EEG does not exclude the diagnosis of epilepsy. Priyanka O Yadav   CT Head Wo Contrast Result Date: 03/24/2024 CLINICAL DATA:  Provided history: Head trauma,  coagulopathy. EXAM: CT HEAD WITHOUT CONTRAST CT MAXILLOFACIAL WITHOUT CONTRAST CT CERVICAL SPINE WITHOUT CONTRAST TECHNIQUE: Multidetector CT imaging of the head, cervical spine, and maxillofacial structures were performed using the standard protocol without intravenous contrast. Multiplanar CT image reconstructions of the cervical spine and maxillofacial structures were also generated. RADIATION DOSE REDUCTION: This exam was performed according to the departmental dose-optimization program which includes automated exposure control, adjustment of the mA and/or kV according to patient size and/or use of iterative reconstruction technique. COMPARISON:  Head CT 03/23/2024. Maxillofacial CT 03/23/2024. Cervical spine CT 03/23/2024. FINDINGS: CT HEAD FINDINGS Mildly motion degraded examination. Within this limitation, findings are as follows. Brain: Generalized cerebral atrophy. Chronic lacunar infarct again demonstrated within the left basal ganglia region. Patchy and ill-defined hypoattenuation within the cerebral white matter, nonspecific but compatible with mild chronic small vessel ischemic disease. There is no acute intracranial hemorrhage. No demarcated cortical infarct. No extra-axial fluid collection. No evidence of an intracranial mass. No midline shift. Vascular: No  hyperdense vessel.  Atherosclerotic calcifications. Skull: No calvarial fracture or aggressive osseous lesion. Other: Right forehead hematoma and laceration. CT MAXILLOFACIAL FINDINGS Moderately motion degraded examination. Within this limitation, findings are as follows. Osseous: Known acute, displaced fractures of the anterior and posterolateral walls of the right maxillary sinus. Known acute, nondisplaced fractures of the medial wall the right orbit and right inferior orbital rim, better appreciated on the prior maxillofacial CT examinations of 03/23/2024 and 03/10/2024. Within the limitations of motion degradation, no interval acute maxillofacial fracture is identified. Orbits: The globes are normal in size and contour. No evidence of retrobulbar hematoma. Sinuses: Near complete opacification of the right maxillary sinus, similar to the prior examination of 03/23/2024 and at least partly due to the presence of hemorrhage. Moderate mucosal thickening within the right sphenoid sinus. Mild mucosal thickening scattered within bilateral ethmoid air cells. Soft tissues: Right forehead laceration and hematoma. Right facial hematoma. CT CERVICAL SPINE FINDINGS Alignment: Levocurvature of the cervical spine. Nonspecific reversal of the expected cervical lordosis. No significant spondylolisthesis. Skull base and vertebrae: The basion-dental and atlanto-dental intervals are maintained.No evidence of acute fracture to the cervical spine. C5-C6 vertebral ankylosis. Unchanged mild anterior wedge deformity of the T1 vertebral body. Soft tissues and spinal canal: No prevertebral fluid or swelling. No visible canal hematoma. Disc levels: Diffuse cervical spondylosis. 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 Multilevel bony neural foraminal narrowing. Multilevel facet arthropathy. Prominent ventral osteophytes at C3-C4, C4-C5,  C5-C6, C6-C7 and C7-T1. Upper chest: No consolidation within the imaged lung apices. No visible pneumothorax. IMPRESSION: CT head: 1. Mildly motion degraded exam. 2. No evidence of an acute intracranial abnormality. 3. Right forehead hematoma and laceration. 4. Cerebral atrophy and cerebral white matter chronic small vessel ischemic disease. 5. Redemonstrated chronic lacunar infarct in the left basal ganglia region. CT maxillofacial: 1. Moderately motion degraded examination. 2. Known acute fractures of the right maxillary sinus, medial right orbital wall and right inferior orbital rim. 3. Within the limitations of motion degradation, no interval acute maxillofacial fracture is identified. 4. Persistent near complete opacification of the right maxillary sinus (at least partly due to the presence of hemorrhage). 5. Mucosal thickening within the right sphenoid and bilateral ethmoid sinuses. 6. Right forehead hematoma and laceration. 7. Right facial hematoma. CT cervical spine: 1. No evidence of an acute cervical spine fracture. 2. Unchanged mild anterior wedge deformity of the T1 vertebral body. 3. Levocurvature of  the cervical spine. 4. Nonspecific reversal of the expected cervical lordosis. 5. Cervical spondylosis as described within body of the report. 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 6. C5-C6 vertebral ankylosis. Electronically Signed   By: Rockey Childs D.O.   On: 03/24/2024 08:48   CT Cervical Spine Wo Contrast Result Date: 03/24/2024 CLINICAL DATA:  Provided history: Head trauma, coagulopathy. EXAM: CT HEAD WITHOUT CONTRAST CT MAXILLOFACIAL WITHOUT CONTRAST CT CERVICAL SPINE WITHOUT CONTRAST TECHNIQUE: Multidetector CT imaging of the head, cervical spine, and maxillofacial structures were performed using the standard protocol without intravenous contrast. Multiplanar CT image reconstructions of the  cervical spine and maxillofacial structures were also generated. RADIATION DOSE REDUCTION: This exam was performed according to the departmental dose-optimization program which includes automated exposure control, adjustment of the mA and/or kV according to patient size and/or use of iterative reconstruction technique. COMPARISON:  Head CT 03/23/2024. Maxillofacial CT 03/23/2024. Cervical spine CT 03/23/2024. FINDINGS: CT HEAD FINDINGS Mildly motion degraded examination. Within this limitation, findings are as follows. Brain: Generalized cerebral atrophy. Chronic lacunar infarct again demonstrated within the left basal ganglia region. Patchy and ill-defined hypoattenuation within the cerebral white matter, nonspecific but compatible with mild chronic small vessel ischemic disease. There is no acute intracranial hemorrhage. No demarcated cortical infarct. No extra-axial fluid collection. No evidence of an intracranial mass. No midline shift. Vascular: No hyperdense vessel.  Atherosclerotic calcifications. Skull: No calvarial fracture or aggressive osseous lesion. Other: Right forehead hematoma and laceration. CT MAXILLOFACIAL FINDINGS Moderately motion degraded examination. Within this limitation, findings are as follows. Osseous: Known acute, displaced fractures of the anterior and posterolateral walls of the right maxillary sinus. Known acute, nondisplaced fractures of the medial wall the right orbit and right inferior orbital rim, better appreciated on the prior maxillofacial CT examinations of 03/23/2024 and 03/10/2024. Within the limitations of motion degradation, no interval acute maxillofacial fracture is identified. Orbits: The globes are normal in size and contour. No evidence of retrobulbar hematoma. Sinuses: Near complete opacification of the right maxillary sinus, similar to the prior examination of 03/23/2024 and at least partly due to the presence of hemorrhage. Moderate mucosal thickening within the  right sphenoid sinus. Mild mucosal thickening scattered within bilateral ethmoid air cells. Soft tissues: Right forehead laceration and hematoma. Right facial hematoma. CT CERVICAL SPINE FINDINGS Alignment: Levocurvature of the cervical spine. Nonspecific reversal of the expected cervical lordosis. No significant spondylolisthesis. Skull base and vertebrae: The basion-dental and atlanto-dental intervals are maintained.No evidence of acute fracture to the cervical spine. C5-C6 vertebral ankylosis. Unchanged mild anterior wedge deformity of the T1 vertebral body. Soft tissues and spinal canal: No prevertebral fluid or swelling. No visible canal hematoma. Disc levels: Diffuse cervical spondylosis. 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 Multilevel bony neural foraminal narrowing. Multilevel facet arthropathy. Prominent ventral osteophytes at C3-C4, C4-C5, C5-C6, C6-C7 and C7-T1. Upper chest: No consolidation within the imaged lung apices. No visible pneumothorax. IMPRESSION: CT head: 1. Mildly motion degraded exam. 2. No evidence of an acute intracranial abnormality. 3. Right forehead hematoma and laceration. 4. Cerebral atrophy and cerebral white matter chronic small vessel ischemic disease. 5. Redemonstrated chronic lacunar infarct in the left basal ganglia region. CT maxillofacial: 1. Moderately motion degraded examination. 2. Known acute fractures of the right maxillary sinus, medial right orbital wall and right inferior orbital rim. 3. Within the limitations  of motion degradation, no interval acute maxillofacial fracture is identified. 4. Persistent near complete opacification of the right maxillary sinus (at least partly due to the presence of hemorrhage). 5. Mucosal thickening within the right sphenoid and bilateral ethmoid sinuses. 6. Right forehead hematoma and laceration. 7. Right facial hematoma. CT  cervical spine: 1. No evidence of an acute cervical spine fracture. 2. Unchanged mild anterior wedge deformity of the T1 vertebral body. 3. Levocurvature of the cervical spine. 4. Nonspecific reversal of the expected cervical lordosis. 5. Cervical spondylosis as described within body of the report. 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 6. C5-C6 vertebral ankylosis. Electronically Signed   By: Rockey Childs D.O.   On: 03/24/2024 08:48   CT Maxillofacial Wo Contrast Result Date: 03/24/2024 CLINICAL DATA:  Provided history: Head trauma, coagulopathy. EXAM: CT HEAD WITHOUT CONTRAST CT MAXILLOFACIAL WITHOUT CONTRAST CT CERVICAL SPINE WITHOUT CONTRAST TECHNIQUE: Multidetector CT imaging of the head, cervical spine, and maxillofacial structures were performed using the standard protocol without intravenous contrast. Multiplanar CT image reconstructions of the cervical spine and maxillofacial structures were also generated. RADIATION DOSE REDUCTION: This exam was performed according to the departmental dose-optimization program which includes automated exposure control, adjustment of the mA and/or kV according to patient size and/or use of iterative reconstruction technique. COMPARISON:  Head CT 03/23/2024. Maxillofacial CT 03/23/2024. Cervical spine CT 03/23/2024. FINDINGS: CT HEAD FINDINGS Mildly motion degraded examination. Within this limitation, findings are as follows. Brain: Generalized cerebral atrophy. Chronic lacunar infarct again demonstrated within the left basal ganglia region. Patchy and ill-defined hypoattenuation within the cerebral white matter, nonspecific but compatible with mild chronic small vessel ischemic disease. There is no acute intracranial hemorrhage. No demarcated cortical infarct. No extra-axial fluid collection. No evidence of an intracranial mass. No midline shift. Vascular: No  hyperdense vessel.  Atherosclerotic calcifications. Skull: No calvarial fracture or aggressive osseous lesion. Other: Right forehead hematoma and laceration. CT MAXILLOFACIAL FINDINGS Moderately motion degraded examination. Within this limitation, findings are as follows. Osseous: Known acute, displaced fractures of the anterior and posterolateral walls of the right maxillary sinus. Known acute, nondisplaced fractures of the medial wall the right orbit and right inferior orbital rim, better appreciated on the prior maxillofacial CT examinations of 03/23/2024 and 03/10/2024. Within the limitations of motion degradation, no interval acute maxillofacial fracture is identified. Orbits: The globes are normal in size and contour. No evidence of retrobulbar hematoma. Sinuses: Near complete opacification of the right maxillary sinus, similar to the prior examination of 03/23/2024 and at least partly due to the presence of hemorrhage. Moderate mucosal thickening within the right sphenoid sinus. Mild mucosal thickening scattered within bilateral ethmoid air cells. Soft tissues: Right forehead laceration and hematoma. Right facial hematoma. CT CERVICAL SPINE FINDINGS Alignment: Levocurvature of the cervical spine. Nonspecific reversal of the expected cervical lordosis. No significant spondylolisthesis. Skull base and vertebrae: The basion-dental and atlanto-dental intervals are maintained.No evidence of acute fracture to the cervical spine. C5-C6 vertebral ankylosis. Unchanged mild anterior wedge deformity of the T1 vertebral body. Soft tissues and spinal canal: No prevertebral fluid or swelling. No visible canal hematoma. Disc levels: Diffuse cervical spondylosis. 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 Multilevel bony neural foraminal narrowing. Multilevel facet arthropathy. Prominent ventral osteophytes at C3-C4, C4-C5,  C5-C6, C6-C7 and C7-T1. Upper chest: No consolidation within the imaged lung apices. No  visible pneumothorax. IMPRESSION: CT head: 1. Mildly motion degraded exam. 2. No evidence of an acute intracranial abnormality. 3. Right forehead hematoma and laceration. 4. Cerebral atrophy and cerebral white matter chronic small vessel ischemic disease. 5. Redemonstrated chronic lacunar infarct in the left basal ganglia region. CT maxillofacial: 1. Moderately motion degraded examination. 2. Known acute fractures of the right maxillary sinus, medial right orbital wall and right inferior orbital rim. 3. Within the limitations of motion degradation, no interval acute maxillofacial fracture is identified. 4. Persistent near complete opacification of the right maxillary sinus (at least partly due to the presence of hemorrhage). 5. Mucosal thickening within the right sphenoid and bilateral ethmoid sinuses. 6. Right forehead hematoma and laceration. 7. Right facial hematoma. CT cervical spine: 1. No evidence of an acute cervical spine fracture. 2. Unchanged mild anterior wedge deformity of the T1 vertebral body. 3. Levocurvature of the cervical spine. 4. Nonspecific reversal of the expected cervical lordosis. 5. Cervical spondylosis as described within body of the report. 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 6. C5-C6 vertebral ankylosis. Electronically Signed   By: Rockey Childs D.O.   On: 03/24/2024 08:48   DG Knee Complete 4 Views Left Result Date: 03/24/2024 CLINICAL DATA:  pain after fall EXAM: LEFT KNEE - COMPLETE 4+ VIEW COMPARISON:  February 16, 2024 FINDINGS: No acute fracture or dislocation. Heterotopic bone formation along the fibular head. No joint effusion. There is no evidence of arthropathy or other focal bone abnormality. Soft tissues are unremarkable. IMPRESSION: No acute fracture or dislocation. Electronically  Signed   By: Rogelia Myers M.D.   On: 03/24/2024 08:39   CT Cervical Spine Wo Contrast Result Date: 03/23/2024 CLINICAL DATA:  Trauma fall EXAM: CT CERVICAL SPINE WITHOUT CONTRAST TECHNIQUE: Multidetector CT imaging of the cervical spine was performed without intravenous contrast. Multiplanar CT image reconstructions were also generated. RADIATION DOSE REDUCTION: This exam was performed according to the departmental dose-optimization program which includes automated exposure control, adjustment of the mA and/or kV according to patient size and/or use of iterative reconstruction technique. COMPARISON:  CT 03/10/2024, 03/16/2024 FINDINGS: Alignment: Straightening of the cervical spine. No subluxation. Facet alignment is normal Skull base and vertebrae: No acute fracture. No primary bone lesion or focal pathologic process. Soft tissues and spinal canal: No prevertebral fluid or swelling. No visible canal hematoma. Disc levels: Diffuse degenerative changes with diffuse disc space narrowing, moderate severe narrowing at C5-C6, C6-C7 and C7-T1. 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 Multilevel facet degenerative changes. Severe foraminal narrowing at C3-C4. Bulky anterior osteophytes. Upper chest: Negative. Other: None IMPRESSION: 1. Straightening of the cervical spine with advanced degenerative changes. No acute osseous abnormality. 2. 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 Electronically Signed   By: Luke Bun M.D.   On: 03/23/2024 16:45   CT MAXILLOFACIAL WO CONTRAST Result Date: 03/23/2024 CLINICAL DATA:  Unwitnessed fall EXAM: CT MAXILLOFACIAL WITHOUT CONTRAST TECHNIQUE: Multidetector CT imaging of the maxillofacial structures was performed. Multiplanar CT image reconstructions were also generated. RADIATION DOSE REDUCTION: This exam was performed according to the departmental  dose-optimization program which includes automated exposure control, adjustment of the mA and/or kV according to patient size and/or use of iterative reconstruction technique. COMPARISON:  Facial CT 03/10/2024 FINDINGS: Osseous: Mastoid air cells are clear. Mandibular heads are normally positioned. Heterogeneous sclerosis in  the right mastoid. Pterygoid plates and zygomatic arches are intact. No acute nasal bone fracture Orbits: Redemonstrated nondisplaced fractures of the medial right orbital wall and inferior right orbital rim. Globes appear intact. No intraorbital soft tissue swelling. Chronic fracture deformity posterior floor of right orbit. Sinuses: Interval opacification of right maxillary sinus. Redemonstrated fractures of the anterior and posterolateral walls of the right maxillary sinus. Soft tissues: Residual right cheek hematoma. Deep laceration left forehead with small right forehead hematoma Limited intracranial: See separately dictated head CT IMPRESSION: 1. Redemonstrated fractures involving the right maxillary sinus and orbit, stable since 03/10/2024. No new fracture abnormality is seen. 2. Residual right cheek hematoma. Deep laceration left forehead with small right forehead scalp hematoma Electronically Signed   By: Luke Bun M.D.   On: 03/23/2024 16:37   DG Pelvis 1-2 Views Result Date: 03/23/2024 CLINICAL DATA:  Trauma fall EXAM: PELVIS - 1-2 VIEW COMPARISON:  03/16/2024 FINDINGS: SI joints are non widened. Pubic symphysis and rami appear intact. Limited assessment of femoral necks due to positioning and superimposition of trochanters. IMPRESSION: Limited assessment of femoral necks due to positioning. If there is high clinical concern for hip fracture, dedicated hip radiographs versus CT follow-up. Otherwise no acute osseous abnormality is seen Electronically Signed   By: Luke Bun M.D.   On: 03/23/2024 16:28   DG Chest 1 View Result Date: 03/23/2024 CLINICAL DATA:  Fall trauma  EXAM: CHEST  1 VIEW COMPARISON:  03/16/2024 FINDINGS: Hypoventilatory changes. No acute airspace disease or effusion. Stable cardiomediastinal silhouette. No pneumothorax IMPRESSION: No active disease. Hypoventilatory changes. Electronically Signed   By: Luke Bun M.D.   On: 03/23/2024 16:27   DG Tibia/Fibula Left Result Date: 03/23/2024 CLINICAL DATA:  Fall EXAM: LEFT TIBIA AND FIBULA - 2 VIEW COMPARISON:  CT knee 02/16/2024 FINDINGS: No fracture or malalignment. Heterotopic ossification surrounding the proximal fibula with ankylosis of the proximal tibiofibular joint as seen on prior CT. Soft tissues are unremarkable IMPRESSION: No acute osseous abnormality. Electronically Signed   By: Luke Bun M.D.   On: 03/23/2024 16:26   CT Head Wo Contrast Result Date: 03/23/2024 CLINICAL DATA:  Unwitnessed fall laceration to left eyebrow trauma EXAM: CT HEAD WITHOUT CONTRAST TECHNIQUE: Contiguous axial images were obtained from the base of the skull through the vertex without intravenous contrast. RADIATION DOSE REDUCTION: This exam was performed according to the departmental dose-optimization program which includes automated exposure control, adjustment of the mA and/or kV according to patient size and/or use of iterative reconstruction technique. COMPARISON:  CT brain 03/10/2024, 03/16/2024 FINDINGS: Brain: No acute territorial infarction, hemorrhage or intracranial mass. Moderate atrophy. Mild chronic small vessel ischemic changes of the white matter. Stable ventricle size. Vascular: No hyperdense vessels.  Carotid vascular calcification. Skull: No depressed skull fracture. Sinuses/Orbits: See separately dictated facial CT. Other: Laceration and hematoma to the left greater than right forehead. IMPRESSION: 1. No definite CT evidence for acute intracranial abnormality. 2. Atrophy and chronic small vessel ischemic changes of the white matter. 3. Laceration and hematoma to the left greater than right forehead.  Electronically Signed   By: Luke Bun M.D.   On: 03/23/2024 16:24   EEG adult Result Date: 03/17/2024 [name], MD     03/17/2024 10:35 AM Patient Name: John [name] MRN: [id number] Epilepsy Attending: Arlin KIDD Shelton Referring Physician/Provider: Ula Prentice SAUNDERS, MD Date: 03/17/2024 Duration: 23.10 mins Patient history: 63 year old male with seizure-like activity.  EEG to evaluate for seizure. Level of alertness: Awake, drowsy  AEDs during EEG study: None Technical aspects: This EEG study was done with scalp electrodes positioned according to the 10-20 International system of electrode placement. Electrical activity was reviewed with band pass filter of 1-70Hz , sensitivity of 7 uV/mm, display speed of 69mm/sec with a 60Hz  notched filter applied as appropriate. EEG data were recorded continuously and digitally stored.  Video monitoring was available and reviewed as appropriate. Description: The posterior dominant rhythm consists of 8 Hz activity of moderate voltage (25-35 uV) seen predominantly in posterior head regions, symmetric and reactive to eye opening and eye closing. Drowsiness was characterized by attenuation of the posterior background rhythm. Physiologic photic driving was not seen during photic stimulation.  Hyperventilation was not performed.   IMPRESSION: This study is within normal limits. No seizures or epileptiform discharges were seen throughout the recording. A normal interictal EEG does not exclude the diagnosis of epilepsy. Priyanka O Yadav \   Micro Results    No results found for this or any previous visit (from the past 240 hours).  Today   Subjective    John [name] today has no headache,no chest abdominal pain,no new weakness tingling or numbness, feels much better wants to go home today.    Objective   Blood pressure 127/88, pulse 86, temperature 97.7 F (36.5 C), resp. rate 18, height 6' 2 (1.88 m), weight 118 kg, SpO2 96%.   Intake/Output Summary (Last  24 hours) at 04/16/2024 0935 Last data filed at 04/16/2024 0418 Gross per 24 hour  Intake 200 ml  Output 850 ml  Net -650 ml    Exam  Awake, mildly confused, No new F.N deficits,    [place].AT,PERRAL Supple Neck,   Symmetrical Chest wall movement, Good air movement bilaterally, CTAB RRR,No Gallops,   +ve B.Sounds, Abd Soft, Non tender, Foley catheter in place No Cyanosis, Clubbing or edema    Data Review   Recent Labs  Lab 04/11/24 0605 04/12/24 0641 04/15/24 0601  WBC 8.5 7.6 7.6  HGB 9.8* 9.9* 9.6*  HCT 31.2* 31.8* 30.5*  PLT 353 335 314  MCV 86.2 87.6 86.9  MCH 27.1 27.3 27.4  MCHC 31.4 31.1 31.5  RDW 15.3 15.3 15.3  LYMPHSABS  --  1.7 1.6  MONOABS  --  0.6 0.5  EOSABS  --  0.3 0.3  BASOSABS  --  0.1 0.1    Recent Labs  Lab 04/11/24 0605 04/12/24 0641 04/15/24 0601 04/16/24 0710  NA 138 136 138 138  K 3.5 3.7 3.3* 3.3*  CL 109 107 109 109  CO2 19* 19* 21* 21*  ANIONGAP 10 10 8 8   GLUCOSE 100* 96 89 108*  BUN 14 12 9 12   CREATININE 1.04 1.11 0.98 1.07  MG 2.0 2.1 1.9  --   PHOS  --  2.9 2.3* 3.4  CALCIUM  9.0 8.9 9.0 8.7*    Total Time in preparing paper work, data evaluation and todays exam - 35 minutes  Signature  -    Lavada Stank M.D on 04/16/2024 at 9:35 AM   -  To page go to [email or web address]

## 2024-04-16 NOTE — Plan of Care (Signed)
  Problem: Skin Integrity: Goal: Risk for impaired skin integrity will decrease 04/16/2024 0047 by Mallory Marks, Laveta Arlean BIRCH, RN Outcome: Not Progressing 04/16/2024 0047 by Mallory Marks, Laveta Arlean BIRCH, RN Outcome: Not Progressing   Problem: Safety: Goal: Ability to remain free from injury will improve 04/16/2024 0047 by Mallory Marks, Laveta Arlean BIRCH, RN Outcome: Not Progressing 04/16/2024 0047 by Mallory Marks, Laveta Arlean BIRCH, RN Outcome: Not Progressing   Problem: Pain Managment: Goal: General experience of comfort will improve and/or be controlled 04/16/2024 0047 by Mallory Marks Laveta Arlean BIRCH, RN Outcome: Not Progressing 04/16/2024 0047 by Mallory Marks, Laveta Arlean BIRCH, RN Outcome: Not Progressing

## 2024-04-16 NOTE — Progress Notes (Signed)
 Attempted to call report to [facility] facility twice with no answer. [facility] here to pick up patient

## 2024-04-19 ENCOUNTER — Telehealth: Payer: Self-pay | Admitting: Neurology

## 2024-04-19 NOTE — Telephone Encounter (Signed)
 LMOVM for patient, no answe LMOVM   Has patient started outpatient PT, And has he been taken his medication as directed.

## 2024-04-19 NOTE — Telephone Encounter (Signed)
 Pt cld back asking for Pain Rx

## 2024-04-19 NOTE — Telephone Encounter (Signed)
 Spoke to patient sister, Patient was admitted to [facility] on 02/2024 for a fall at home he was found in his tube.  Since then he has been transferred back and fourth between [name] and the ER for Falls on 7/9-until Current. 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 And he needs to see Neurology for surgical consult.  Unsure if he has been getting his Parkinson's medication.   BP will drop every time he stands up. potassium levels off.   Patient was advised by sister and Attending that his is not to get up with out asking for up.    Advised patient sister patient has to get stable to leave the Rehab to be seen by [facility] and maybe get a   Patient and sister should discuss with the Attending at the Rehab facility about medication and PT and the Case worker.

## 2024-04-19 NOTE — Telephone Encounter (Signed)
 Pt. Went to hospital and would like to discuss next appt and Rx dosages

## 2024-04-20 NOTE — Telephone Encounter (Signed)
 Patient sister advised of Dr.Jaffe, As per hospital note, he is to see neurosurgery, Dr. [name], at [facility].

## 2024-04-25 ENCOUNTER — Emergency Department (HOSPITAL_COMMUNITY)

## 2024-04-25 ENCOUNTER — Emergency Department (HOSPITAL_COMMUNITY)
Admission: EM | Admit: 2024-04-25 | Discharge: 2024-04-25 | Disposition: A | Discharge status: Skilled Nursing Facility | Attending: Emergency Medicine | Admitting: Emergency Medicine

## 2024-04-25 ENCOUNTER — Encounter (HOSPITAL_COMMUNITY): Payer: Self-pay

## 2024-04-25 DIAGNOSIS — G20A1 Parkinson's disease without dyskinesia, without mention of fluctuations: Secondary | ICD-10-CM | POA: Diagnosis not present

## 2024-04-25 DIAGNOSIS — W19XXXA Unspecified fall, initial encounter: Secondary | ICD-10-CM

## 2024-04-25 DIAGNOSIS — R519 Headache, unspecified: Secondary | ICD-10-CM | POA: Diagnosis present

## 2024-04-25 DIAGNOSIS — S01111A Laceration without foreign body of right eyelid and periocular area, initial encounter: Secondary | ICD-10-CM | POA: Insufficient documentation

## 2024-04-25 DIAGNOSIS — Z7901 Long term (current) use of anticoagulants: Secondary | ICD-10-CM | POA: Diagnosis not present

## 2024-04-25 DIAGNOSIS — W01198A Fall on same level from slipping, tripping and stumbling with subsequent striking against other object, initial encounter: Secondary | ICD-10-CM | POA: Diagnosis not present

## 2024-04-25 DIAGNOSIS — S50311A Abrasion of right elbow, initial encounter: Secondary | ICD-10-CM | POA: Diagnosis not present

## 2024-04-25 LAB — CBC WITH DIFFERENTIAL/PLATELET
Abs Immature Granulocytes: 0.03 K/uL (ref 0.00–0.07)
Basophils Absolute: 0.1 K/uL (ref 0.0–0.1)
Basophils Relative: 1 %
Eosinophils Absolute: 0.3 K/uL (ref 0.0–0.5)
Eosinophils Relative: 4 %
HCT: 32.2 % — ABNORMAL LOW (ref 39.0–52.0)
Hemoglobin: 9.3 g/dL — ABNORMAL LOW (ref 13.0–17.0)
Immature Granulocytes: 0 %
Lymphocytes Relative: 21 %
Lymphs Abs: 1.4 K/uL (ref 0.7–4.0)
MCH: 26.6 pg (ref 26.0–34.0)
MCHC: 28.9 g/dL — ABNORMAL LOW (ref 30.0–36.0)
MCV: 92 fL (ref 80.0–100.0)
Monocytes Absolute: 0.4 K/uL (ref 0.1–1.0)
Monocytes Relative: 6 %
Neutro Abs: 4.7 K/uL (ref 1.7–7.7)
Neutrophils Relative %: 68 %
Platelets: 313 K/uL (ref 150–400)
RBC: 3.5 MIL/uL — ABNORMAL LOW (ref 4.22–5.81)
RDW: 14.7 % (ref 11.5–15.5)
WBC: 7 K/uL (ref 4.0–10.5)
nRBC: 0 % (ref 0.0–0.2)

## 2024-04-25 LAB — COMPREHENSIVE METABOLIC PANEL WITH GFR
ALT: 9 U/L (ref 0–44)
AST: 30 U/L (ref 15–41)
Albumin: 2.7 g/dL — ABNORMAL LOW (ref 3.5–5.0)
Alkaline Phosphatase: 156 U/L — ABNORMAL HIGH (ref 38–126)
Anion gap: 9 (ref 5–15)
BUN: 10 mg/dL (ref 8–23)
CO2: 21 mmol/L — ABNORMAL LOW (ref 22–32)
Calcium: 8.7 mg/dL — ABNORMAL LOW (ref 8.9–10.3)
Chloride: 107 mmol/L (ref 98–111)
Creatinine, Ser: 1.13 mg/dL (ref 0.61–1.24)
GFR, Estimated: >60 mL/min (ref >60)
Glucose, Bld: 118 mg/dL — ABNORMAL HIGH (ref 70–99)
Potassium: 3.3 mmol/L — ABNORMAL LOW (ref 3.5–5.1)
Sodium: 137 mmol/L (ref 135–145)
Total Bilirubin: 0.4 mg/dL (ref 0.0–1.2)
Total Protein: 6.1 g/dL — ABNORMAL LOW (ref 6.5–8.1)

## 2024-04-25 LAB — MAGNESIUM: Magnesium: 1.8 mg/dL (ref 1.7–2.4)

## 2024-04-25 LAB — PROTIME-INR
INR: 1.2 (ref 0.8–1.2)
Prothrombin Time: 15.7 s — ABNORMAL HIGH (ref 11.4–15.2)

## 2024-04-25 LAB — APTT: aPTT: 36 s (ref 24–36)

## 2024-04-25 MED ORDER — LIDOCAINE-EPINEPHRINE 1 %-1:100000 IJ SOLN
10.0000 mL | Freq: Once | INTRAMUSCULAR | Status: AC
  Administered 2024-04-25: 10 mL
  Filled 2024-04-25: qty 1

## 2024-04-25 MED ORDER — MORPHINE SULFATE (PF) 4 MG/ML IV SOLN
4.0000 mg | Freq: Once | INTRAVENOUS | Status: AC
  Administered 2024-04-25: 4 mg via INTRAVENOUS
  Filled 2024-04-25: qty 1

## 2024-04-25 NOTE — ED Notes (Signed)
 [facility] called

## 2024-04-25 NOTE — Progress Notes (Signed)
 Orthopedic Tech Progress Note Patient Details:  John [name] 1961-05-22 [id number]  Level 2 trauma   Patient ID: John [name], male   DOB: [birth year], 63 y.o.   MRN: [id number]  John [name] 04/25/2024, 6:17 PM

## 2024-04-25 NOTE — Discharge Instructions (Signed)
 Thank you for allowing us  to care for you today.  You came to the emergency department after a fall.  We are able to perform x-ray and CT imaging which does not show acute fracture or injury.  You did have a eyebrow laceration that we were able to repair.  The sutures will dissolve on their own and you do not need to have these removed.  Please be sure to follow-up with your primary care provider.  Please return to the emergency department if you experience worsening pain, chest pain, shortness of breath, passout or believe you need emergent medical care.  We hope you feel better soon

## 2024-04-25 NOTE — ED Provider Notes (Signed)
 [place] EMERGENCY DEPARTMENT AT [facility] Provider Note   CSN: [id number] Arrival date & time: 04/25/24  8190     Patient presents with: No chief complaint on file.   John [name] is a 63 y.o. male past medical history significant for Parkinson's disease, PE on anticoagulation, recent admission for multiple falls found to have subdural hematoma who presents from [name] via EMS to the emergency department for mechanical fall.  Patient states that he had a mechanical fall in which she was trying to get up however tripped causing him to fall and hit his head however did not lose consciousness.  On arrival patient endorses headache, neck pain, left knee and right elbow pain.   HPI     Prior to Admission medications   Medication Sig Start Date End Date Taking? Authorizing Provider  acetaminophen  (TYLENOL ) 500 MG tablet Take 1 tablet (500 mg total) by mouth every 6 (six) hours as needed for mild pain (pain score 1-3), moderate pain (pain score 4-6) or headache. 03/16/24   Hongalgi, Anand D, MD  apixaban  (ELIQUIS ) 5 MG TABS tablet Take 2 tabs (10 mg total) twice a day through 03/18/2024.  From 03/19/2024, start taking 1 tab (5 Mg total) twice daily. 03/16/24   Hongalgi, Anand D, MD  carbidopa -levodopa  (SINEMET  CR) 50-200 MG tablet Take 1 tablet by mouth 6 (six) times daily. (4-5 AM, 8 AM, 11 AM, 2 PM, 5 PM, 8 PM). 12/08/23   Skeet, Adam R, DO  cyanocobalamin  1000 MCG tablet Take 1 tablet (1,000 mcg total) by mouth daily. 03/23/24   Rashid, Farhan, MD  HYDROcodone -acetaminophen  (NORCO/VICODIN) 5-325 MG tablet Take 1 tablet by mouth every 8 (eight) hours as needed for severe pain (pain score 7-10). 04/16/24   Dennise Lavada MARLA, MD  midodrine  (PROAMATINE ) 5 MG tablet Take 1 tablet (5 mg total) by mouth 2 (two) times daily with a meal. 04/16/24   Singh, Prashant K, MD  Multiple Vitamin (MULTIVITAMIN WITH MINERALS) TABS tablet Take 1 tablet by mouth daily. 03/23/24   Rashid, Farhan, MD   omeprazole  (PRILOSEC  OTC) 20 MG tablet Take 20 mg by mouth daily.    [provider]  pyridOXINE  (B-6) 50 MG tablet Take 1 tablet (50 mg total) by mouth daily. 04/17/24   Singh, Prashant K, MD  thiamine  (VITAMIN B-1) 100 MG tablet Take 1 tablet (100 mg total) by mouth daily. 04/17/24   Singh, Prashant K, MD    Allergies: Flomax  [tamsulosin  hcl]    Review of Systems  Updated Vital Signs BP 138/82   Pulse 96   Temp 98 F (36.7 C) (Oral)   Ht 6' 2 (1.88 m)   Wt 113.4 kg   SpO2 98%   BMI 32.10 kg/m   Physical Exam Vitals reviewed.  Constitutional:      General: He is not in acute distress.    Interventions: Cervical collar in place.     Comments: Chronically ill-appearing  HENT:     Head: Normocephalic. Laceration present. No raccoon eyes or Battle's sign.     Jaw: There is normal jaw occlusion.     Comments: Right sick centimeter eyebrow laceration.  EOM intact.  Right periorbital ecchymosis. Eyes:     Pupils: Pupils are equal, round, and reactive to light.  Neck:     Vascular: No JVD.     Trachea: Trachea normal.     Comments: Range of motion limited secondary to cervical spine collar however patient does have midline cervical spine  tenderness to palpation without obvious deformity or step-off Cardiovascular:     Rate and Rhythm: Normal rate and regular rhythm.     Pulses:          Radial pulses are 2+ on the right side and 2+ on the left side.     Heart sounds: Normal heart sounds. No murmur heard. Pulmonary:     Effort: Pulmonary effort is normal. No tachypnea.  Chest:     Comments: No chest wall tenderness to palpation Abdominal:     Comments: Soft, nontender and nondistended  Genitourinary:    Comments: Foley catheter in place without evidence of acute injury, active bleeding, erythema Musculoskeletal:     Cervical back: Spinous process tenderness and muscular tenderness present.     Right lower leg: No edema.     Left lower leg: No edema.     Comments:  Spontaneous movements of bilateral upper and lower extremities.  Right elbow abrasion present without laceration and no obvious deformity however tenderness to palpation.  Left anterior knee tenderness to palpation without obvious deformity, laceration or abrasion.  Skin:    General: Skin is warm.     Capillary Refill: Capillary refill takes less than 2 seconds.  Neurological:     Mental Status: He is alert.  Psychiatric:        Behavior: Behavior is cooperative.     (all labs ordered are listed, but only abnormal results are displayed) Labs Reviewed  CBC WITH DIFFERENTIAL/PLATELET - Abnormal; Notable for the following components:      Result Value   RBC 3.50 (*)    Hemoglobin 9.3 (*)    HCT 32.2 (*)    MCHC 28.9 (*)    All other components within normal limits  COMPREHENSIVE METABOLIC PANEL WITH GFR - Abnormal; Notable for the following components:   Potassium 3.3 (*)    CO2 21 (*)    Glucose, Bld 118 (*)    Calcium  8.7 (*)    Total Protein 6.1 (*)    Albumin 2.7 (*)    Alkaline Phosphatase 156 (*)    All other components within normal limits  PROTIME-INR - Abnormal; Notable for the following components:   Prothrombin Time 15.7 (*)    All other components within normal limits  MAGNESIUM   APTT    EKG: None  Radiology: DG Pelvis 1-2 Views Result Date: 04/25/2024 CLINICAL DATA:  fall EXAM: PELVIS - 1-2 VIEW COMPARISON:  X-ray pelvis 03/23/2024 FINDINGS: There is no evidence of pelvic fracture or diastasis. No acute displaced fracture or dislocation of the hips. No pelvic bone lesions are seen. IMPRESSION: Negative for acute traumatic injury. Electronically Signed   By: Morgane  Naveau M.D.   On: 04/25/2024 19:17   DG Knee 2 Views Left Result Date: 04/25/2024 CLINICAL DATA:  fall EXAM: LEFT KNEE - 1-2 VIEW COMPARISON:  X-ray left knee 03/24/2024 FINDINGS: No evidence of fracture, dislocation, or joint effusion. Mild tricompartmental degenerative changes of the knee.  Nonaggressive appearing periosteal reaction along the proximal fibula and tibia. Soft tissues are unremarkable. IMPRESSION: No acute displaced fracture or dislocation. Electronically Signed   By: Morgane  Naveau M.D.   On: 04/25/2024 19:16   CT Head Wo Contrast Result Date: 04/25/2024 CLINICAL DATA:  FOT; fall w head injury on thinners Fall, LAC above rt eye EXAM: CT HEAD WITHOUT CONTRAST CT CERVICAL SPINE WITHOUT CONTRAST TECHNIQUE: Multidetector CT imaging of the head and cervical spine was performed following the standard protocol without intravenous contrast. Multiplanar  CT image reconstructions of the cervical spine were also generated. RADIATION DOSE REDUCTION: This exam was performed according to the departmental dose-optimization program which includes automated exposure control, adjustment of the mA and/or kV according to patient size and/or use of iterative reconstruction technique. COMPARISON:  CT head 04/05/2024, MRI head and C-spine 03/29/2024 FINDINGS: CT HEAD FINDINGS Brain: Patchy and confluent areas of decreased attenuation are noted throughout the deep and periventricular white matter of the cerebral hemispheres bilaterally, compatible with chronic microvascular ischemic disease. No evidence of large-territorial acute infarction. No parenchymal hemorrhage. No mass lesion. No extra-axial collection. No mass effect or midline shift. No hydrocephalus. Basilar cisterns are patent. Vascular: No hyperdense vessel. Skull: No acute fracture or focal lesion. Sinuses/Orbits: Right maxillary sinus mucosal thickening. Paranasal sinuses and mastoid air cells are clear. The orbits are unremarkable. Other: Trace bilateral frontal scalp trace hematoma with overlying right soft tissue defect. No retained radiopaque foreign body. CT CERVICAL SPINE FINDINGS Alignment: Normal. Skull base and vertebrae: Multilevel severe degenerative change of the spine. 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 Severe bilateral osseous neural foraminal stenosis at the C3-C4 level. At least moderate osseous central canal stenosis at the C3-C4 level. No acute fracture. No aggressive appearing focal osseous lesion or focal pathologic process. Soft tissues and spinal canal: No prevertebral fluid or swelling. No visible canal hematoma. Upper chest: Unremarkable. Other: None. IMPRESSION: 1. No acute intracranial abnormality. 2. No acute displaced fracture or traumatic listhesis of the cervical spine. 3. Multilevel severe degenerative change of the spine. 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 At least moderate osseous central canal stenosis at the C3-C4 level. Electronically Signed   By: Morgane  Naveau M.D.   On: 04/25/2024 19:15   CT Cervical Spine Wo Contrast Result Date: 04/25/2024 CLINICAL DATA:  FOT; fall w head injury on thinners Fall, LAC above rt eye EXAM: CT HEAD WITHOUT CONTRAST CT CERVICAL SPINE WITHOUT CONTRAST TECHNIQUE: Multidetector CT imaging of the head and cervical spine was performed following the standard protocol without intravenous contrast. Multiplanar CT image reconstructions of the cervical spine were also generated. RADIATION DOSE REDUCTION: This exam was performed according to the departmental dose-optimization program which includes automated exposure control, adjustment of the mA and/or kV according to patient size and/or use of iterative reconstruction technique. COMPARISON:  CT head 04/05/2024, MRI head and C-spine 03/29/2024 FINDINGS: CT HEAD FINDINGS Brain: Patchy and confluent areas of decreased attenuation are noted throughout the deep and periventricular white matter of the cerebral hemispheres bilaterally, compatible with chronic microvascular ischemic disease. No evidence of large-territorial acute infarction. No parenchymal hemorrhage. No mass lesion. No extra-axial collection. No  mass effect or midline shift. No hydrocephalus. Basilar cisterns are patent. Vascular: No hyperdense vessel. Skull: No acute fracture or focal lesion. Sinuses/Orbits: Right maxillary sinus mucosal thickening. Paranasal sinuses and mastoid air cells are clear. The orbits are unremarkable. Other: Trace bilateral frontal scalp trace hematoma with overlying right soft tissue defect. No retained radiopaque foreign body. CT CERVICAL SPINE FINDINGS Alignment: Normal. Skull base and vertebrae: Multilevel severe degenerative change of the spine. 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 At least moderate osseous central canal stenosis at the C3-C4 level. No acute fracture. No aggressive appearing focal osseous lesion or focal pathologic process. Soft tissues and spinal canal: No prevertebral fluid or swelling. No visible canal hematoma. Upper chest: Unremarkable.  Other: None. IMPRESSION: 1. No acute intracranial abnormality. 2. No acute displaced fracture or traumatic listhesis of the cervical spine. 3. Multilevel severe degenerative change of the spine. 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 At least moderate osseous central canal stenosis at the C3-C4 level. Electronically Signed   By: Morgane  Naveau M.D.   On: 04/25/2024 19:15   DG Femur Min 2 Views Right Result Date: 04/25/2024 CLINICAL DATA:  Recent fall with right leg pain, initial encounter EXAM: RIGHT FEMUR 2 VIEWS COMPARISON:  None Available. FINDINGS: Exam is somewhat limited due to incomplete positioning. No acute fracture or dislocation is noted. No soft tissue changes are seen. IMPRESSION: No acute abnormality noted on this limited exam. Electronically Signed   By: Oneil Devonshire M.D.   On: 04/25/2024 19:04   DG Elbow Complete Right Result Date: 04/25/2024 CLINICAL  DATA:  Recent fall with right elbow pain, initial encounter EXAM: RIGHT ELBOW - COMPLETE 3+ VIEW COMPARISON:  None Available. FINDINGS: Mild olecranon spurring is noted. No acute fracture or dislocation is seen. No soft tissue abnormality is noted. IMPRESSION: No acute abnormality noted. Electronically Signed   By: Oneil Devonshire M.D.   On: 04/25/2024 19:01   DG Chest Portable 1 View Result Date: 04/25/2024 CLINICAL DATA:  Recent fall with chest pain, initial encounter EXAM: PORTABLE CHEST 1 VIEW COMPARISON:  03/23/2024 FINDINGS: Cardiac shadow is stable. Lungs are well aerated bilaterally. No focal infiltrate is seen. The left lung base is somewhat trimmed from the film due to patient positioning. IMPRESSION: No acute abnormality is noted. The left lung base is not completely visualized. Electronically Signed   By: Oneil Devonshire M.D.   On: 04/25/2024 19:00     Procedures   Medications Ordered in the ED  lidocaine -EPINEPHrine  (XYLOCAINE  W/EPI) 1 %-1:100000 (with pres) injection 10 mL (10 mLs Other Given 04/25/24 1933)  morphine  (PF) 4 MG/ML injection 4 mg (4 mg Intravenous Given 04/25/24 1932)    Clinical Course as of 04/25/24 2234  Sun Apr 25, 2024  1821 Tetanus updated 2021 [AG]  1830 Chest x-ray reviewed by me: Trachea is midline, no pneumothorax.  No evidence of obvious acute fractures or focal consolidation.  Pelvis x-ray reviewed by me: Hips are located without evidence of obvious acute fracture or dislocation.  Pelvic ring intact [AG]  1846 Hemoglobin(!): 9.3 At baseline [RP]  1908 CT head reviewed by me without evidence of acute ICH [AG]    Clinical Course User Index [AG] Nada Chroman, DO [RP] Yolande Lamar BROCKS, MD                                 Medical Decision Making Amount and/or Complexity of Data Reviewed Labs: ordered. Decision-making details documented in ED Course. Radiology: ordered.  Risk Prescription drug management.   On initial evaluation patient's airway is  patent, bilateral breath sounds present and patient has strong palpable distal pulses.  Cervical spine collar placed prehospital and maintained during patient transportation as patient had head strike during fall.  Primary exam performed without need for acute intervention.  Chest and pelvis x-ray obtained without evidence of pneumothorax, hemothorax, acute fracture or dislocation and no acute interventions necessary.  Differential diagnosis based upon patient's primary evaluation includes acute fracture, acute dislocation, ligamentous injury, laceration, ICH.  Will obtain CT and x-ray imaging to assess for acute injury.  CT  and x-ray imaging negative for acute fracture or dislocation.  Right eyebrow laceration performed without complication and patient tolerated procedure well.  As patient has no further acute injuries and no underlying metabolic abnormalities including infection, electrolyte abnormalities, anemia or worsening renal function believe patient is safe to be discharged at this time.  Patient is ambulatory with walker at baseline and was able to ambulate during evaluation.  Patient is tolerating p.o. intake.  Patient was given strict return precautions and agreed with and understood plan of care at time of discharge     Final diagnoses:  Fall, initial encounter  Laceration of right eyebrow, initial encounter    ED Discharge Orders     None       Lavanda Bolster DO Emergency Medicine PGY2    Bolster Lavanda, DO 04/25/24 2234    Yolande Lamar BROCKS, MD 04/30/24 1031

## 2024-04-25 NOTE — ED Triage Notes (Signed)
 PT BIB [facility] from [facility] facility due to mechanical fall around 1700. PT's room mate called staff by 1715. PT denies LOC. Lac above R eye and hit back of head on floor. Endorses pain on R hip. Hx of parkinsons and takes Eliquis . Aox4, C collar in place.  EMS VS: 160/100 BP, 99HR, 18RR, Spo2 98% on RA, CBG 138

## 2024-05-10 ENCOUNTER — Telehealth: Payer: Self-pay | Admitting: Neurology

## 2024-05-10 NOTE — Telephone Encounter (Signed)
 Pt called to see if he could get his PT home health back at home. He doesn't want to go anywhere for the PT

## 2024-05-10 NOTE — Telephone Encounter (Signed)
 Per Dr.Jaffe okay to place order.  Tried placing order per Rep at [facility], Morning Alexys Gassett,   I went back and looked at the dates and notes. He would unfortunately need to be seen by you all again before we could consider. I read he was at [name] and surprised if they did not set him up for [facility] before leaving. Nevertheless if they did not I would need the visit completed with his provider before we could assist.    I spoke to the patient to see if [name] did place an order is unsure.  Advised patient to call them back to check or schedule a visit to see the PCP he may can see him sooner then [facility] to have the order placed.

## 2024-06-02 ENCOUNTER — Telehealth: Payer: Self-pay | Admitting: Neurology

## 2024-06-02 NOTE — Telephone Encounter (Signed)
 Pt. Called in this morning and he stated that his knee  is in pain. Pt stated that [facility] give him 1 Tylenol  every 8 hours , but he is still in pain. Pt. Wants the Dr. To call in a pain prescription for him. Thanks

## 2024-06-03 NOTE — Telephone Encounter (Signed)
 Called patient and informed him of Dr. Jayme advice to contact PCP. Patient verbalized understanding and had no further questions or concerns.

## 2024-06-10 ENCOUNTER — Emergency Department (HOSPITAL_COMMUNITY)

## 2024-06-10 ENCOUNTER — Other Ambulatory Visit: Payer: Self-pay

## 2024-06-10 ENCOUNTER — Emergency Department (HOSPITAL_COMMUNITY)
Admission: EM | Admit: 2024-06-10 | Discharge: 2024-06-10 | Disposition: A | Discharge status: Home / Self Care | Attending: Emergency Medicine | Admitting: Emergency Medicine

## 2024-06-10 ENCOUNTER — Encounter (HOSPITAL_COMMUNITY): Payer: Self-pay

## 2024-06-10 DIAGNOSIS — Z7901 Long term (current) use of anticoagulants: Secondary | ICD-10-CM | POA: Insufficient documentation

## 2024-06-10 DIAGNOSIS — Y92129 Unspecified place in nursing home as the place of occurrence of the external cause: Secondary | ICD-10-CM | POA: Insufficient documentation

## 2024-06-10 DIAGNOSIS — W06XXXA Fall from bed, initial encounter: Secondary | ICD-10-CM | POA: Insufficient documentation

## 2024-06-10 DIAGNOSIS — S0083XA Contusion of other part of head, initial encounter: Secondary | ICD-10-CM | POA: Diagnosis not present

## 2024-06-10 DIAGNOSIS — S0990XA Unspecified injury of head, initial encounter: Secondary | ICD-10-CM | POA: Diagnosis present

## 2024-06-10 DIAGNOSIS — Y9302 Activity, running: Secondary | ICD-10-CM | POA: Diagnosis not present

## 2024-06-10 DIAGNOSIS — Z79899 Other long term (current) drug therapy: Secondary | ICD-10-CM | POA: Insufficient documentation

## 2024-06-10 DIAGNOSIS — W19XXXA Unspecified fall, initial encounter: Secondary | ICD-10-CM

## 2024-06-10 MED ORDER — BACITRACIN ZINC 500 UNIT/GM EX OINT: TOPICAL_OINTMENT | Freq: Two times a day (BID) | CUTANEOUS | Status: DC

## 2024-06-10 MED ORDER — ACETAMINOPHEN 500 MG PO TABS
1000.0000 mg | ORAL_TABLET | Freq: Once | ORAL | Status: AC
  Administered 2024-06-10: 1000 mg via ORAL
  Filled 2024-06-10: qty 2

## 2024-06-10 NOTE — ED Provider Notes (Signed)
  EMERGENCY DEPARTMENT AT [facility] Provider Note   CSN: [id number] Arrival date & time: 06/10/24  0157     Patient presents with: John [name] is a 63 y.o. male.   The patient is a 63 year old male who presents after a fall. He reports tripping over a pair of running shoes while wearing bedroom slippers, causing him to stumble and fall in the dark. The patient denies any loss of consciousness or other injuries besides a superficial forehead injury. He reports no pain and denies any additional injuries to his legs or back, although he has a known history of arthritis in his back. The patient is currently taking Eliquis . He received his last tetanus shot five years ago. History was obtained from the patient.   Fall       Prior to Admission medications   Medication Sig Start Date End Date Taking? Authorizing Provider  acetaminophen  (TYLENOL ) 500 MG tablet Take 1 tablet (500 mg total) by mouth every 6 (six) hours as needed for mild pain (pain score 1-3), moderate pain (pain score 4-6) or headache. 03/16/24   Hongalgi, Anand D, MD  apixaban  (ELIQUIS ) 5 MG TABS tablet Take 2 tabs (10 mg total) twice a day through 03/18/2024.  From 03/19/2024, start taking 1 tab (5 Mg total) twice daily. 03/16/24   Hongalgi, Anand D, MD  carbidopa -levodopa  (SINEMET  CR) 50-200 MG tablet Take 1 tablet by mouth 6 (six) times daily. (4-5 AM, 8 AM, 11 AM, 2 PM, 5 PM, 8 PM). 12/08/23   Jaffe, Adam R, DO  cyanocobalamin  1000 MCG tablet Take 1 tablet (1,000 mcg total) by mouth daily. 03/23/24   Rashid, Farhan, MD  HYDROcodone -acetaminophen  (NORCO/VICODIN) 5-325 MG tablet Take 1 tablet by mouth every 8 (eight) hours as needed for severe pain (pain score 7-10). 04/16/24   Dennise Lavada MARLA, MD  midodrine  (PROAMATINE ) 5 MG tablet Take 1 tablet (5 mg total) by mouth 2 (two) times daily with a meal. 04/16/24   Singh, Prashant K, MD  Multiple Vitamin (MULTIVITAMIN WITH MINERALS) TABS tablet Take 1 tablet  by mouth daily. 03/23/24   Rashid, Farhan, MD  omeprazole  (PRILOSEC  OTC) 20 MG tablet Take 20 mg by mouth daily.    [provider]  pyridOXINE  (B-6) 50 MG tablet Take 1 tablet (50 mg total) by mouth daily. 04/17/24   Singh, Prashant K, MD  thiamine  (VITAMIN B-1) 100 MG tablet Take 1 tablet (100 mg total) by mouth daily. 04/17/24   Dennise Lavada MARLA, MD    Allergies: Flomax  [tamsulosin  hcl]    Review of Systems  Updated Vital Signs BP (!) 165/95   Pulse 93   Temp 97.9 F (36.6 C)   Resp 17   SpO2 99%   Physical Exam Vitals and nursing note reviewed.  Constitutional:      Appearance: He is well-developed.  HENT:     Head: Normocephalic.     Comments: Abrasion and small associated hematoma to right forehead, no obvious deformity or fracture associated with it.  Cardiovascular:     Rate and Rhythm: Normal rate.  Pulmonary:     Effort: Pulmonary effort is normal. No respiratory distress.  Abdominal:     General: There is no distension.  Musculoskeletal:        General: Normal range of motion.     Cervical back: Normal range of motion.  Neurological:     Mental Status: He is alert.     (all labs ordered  are listed, but only abnormal results are displayed) Labs Reviewed - No data to display  EKG: None  Radiology: CT Cervical Spine Wo Contrast Result Date: 06/10/2024 EXAM: CT CERVICAL SPINE WITHOUT CONTRAST 06/10/2024 04:08:44 AM TECHNIQUE: CT of the cervical spine was performed without the administration of intravenous contrast. Multiplanar reformatted images are provided for review. Automated exposure control, iterative reconstruction, and/or weight based adjustment of the mA/kV was utilized to reduce the radiation dose to as low as reasonably achievable. COMPARISON: CT of the cervical spine dated 04/25/2024. CLINICAL HISTORY: Neck trauma, impaired ROM (Age 2-64y). Pt b ibgcems from [facility] pt had unwitnessed fall getting out of bed. Pt did hit head on  ground with no loc.small hematoma on right forehead. FINDINGS: CERVICAL SPINE: BONES AND ALIGNMENT: There is no evidence of fracture or acute traumatic injury. 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 At C3-C4, there is severe bilateral neural foraminal stenosis and moderate-to-severe central spinal canal stenosis. There are anterior bridging osteophytes. At C7, there is moderate left neural foraminal stenosis. SOFT TISSUES: No prevertebral soft tissue swelling. IMPRESSION: 1. No evidence of fracture or acute traumatic injury. 2. 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 3. Moderate left neural foraminal stenosis at C7. Electronically signed by: Evalene Coho MD 06/10/2024 04:23 AM EDT RP Workstation: [id number]   CT Head Wo Contrast Result Date: 06/10/2024 EXAM: CT HEAD WITHOUT CONTRAST 06/10/2024 04:08:44 AM TECHNIQUE: CT of the head was performed without the administration of intravenous contrast. Automated exposure control, iterative reconstruction, and/or weight based adjustment of the mA/kV was utilized to reduce the radiation dose to as low as reasonably achievable. COMPARISON: CT of the head dated 04/25/2024. CLINICAL HISTORY: Head trauma, moderate-severe. Pt b ibgcems from [facility] pt had unwitnessed fall getting out of bed. Pt did hit head on ground with no loc.small hematoma on right forehead. FINDINGS: BRAIN AND VENTRICLES: No acute hemorrhage. No evidence of acute infarct. No hydrocephalus. No extra-axial collection. No mass effect or midline shift. There is age-related atrophy and mild periventricular white matter disease. ORBITS: No acute abnormality. SINUSES: No acute abnormality. SOFT TISSUES AND SKULL: There is soft tissue swelling of the right forehead. No skull fracture. IMPRESSION: 1. No acute intracranial  abnormality. 2. Soft tissue swelling of the right forehead, likely related to the reported trauma. Electronically signed by: Evalene Coho MD 06/10/2024 04:19 AM EDT RP Workstation: [id number]     Procedures   Medications Ordered in the ED  bacitracin  ointment (has no administration in time range)  acetaminophen  (TYLENOL ) tablet 1,000 mg (1,000 mg Oral Given 06/10/24 0317)                                    Medical Decision Making Amount and/or Complexity of Data Reviewed Radiology: ordered.  Risk OTC drugs.   Level 2 trauam 2/2 head injury with anticoagulation. Ct ok. No other injuries. Mechanical cause. Stable for d/c to facility.      Final diagnoses:  Fall, initial encounter    ED Discharge Orders     None          Maegen Wigle, Selinda, MD 06/10/24 [phone]

## 2024-06-10 NOTE — ED Notes (Signed)
 ETA IN 30 Minute

## 2024-06-10 NOTE — ED Triage Notes (Signed)
 Pt b ibgcems from [place] [place] [facility] pt had unwitnessed fall getting out of bed. Pt did hit head on ground with no loc.small hematoma on right forehead.   Bp 158/84 Hr 92 96% ra Cbg 119

## 2024-06-10 NOTE — ED Notes (Signed)
 Patient transported to CT

## 2024-06-10 NOTE — ED Notes (Signed)
 Wound on forehead cleaned and bacitracin  ointment applied to wound.

## 2024-06-21 ENCOUNTER — Encounter: Payer: Self-pay | Admitting: Neurology

## 2024-06-21 ENCOUNTER — Ambulatory Visit (INDEPENDENT_AMBULATORY_CARE_PROVIDER_SITE_OTHER): Admitting: Neurology

## 2024-06-21 VITALS — BP 135/75 | HR 97 | Ht 74.0 in | Wt 254.0 lb

## 2024-06-21 DIAGNOSIS — G20A2 Parkinson's disease without dyskinesia, with fluctuations: Secondary | ICD-10-CM | POA: Diagnosis not present

## 2024-06-21 NOTE — Patient Instructions (Signed)
 Carbidopa -levodopa  CR 50/200mg  1 tablet 6 times daily (4-5 AM, 8 AM, 11 AM, 2 PM, 5 PM and 8 PM) No getting up or walking alone Neurocognitive evaluation Have long-term care discussion with family Follow up in 4 months.

## 2024-06-21 NOTE — Progress Notes (Signed)
 NEUROLOGY FOLLOW UP OFFICE NOTE  John [name] [id number]  Assessment/Plan:   Parkinson's disease Cervical spinal stenosis   Carbidopa -levodopa  CR 50/200mg  1 tablet 6 times daily (4-5 AM, 8 AM, 11 AM, 2 PM, 5 PM and 8 PM) Will order neuropsychological evaluation to establish baseline and current status Advised that once he leaves SNF, he should not live alone Advised that he should not get up or ambulate without assistance. Advised to talk with family about long-term plans and healthcare POA.   Follow up 4 months.  Total time spent on today's visit was 83 minutes dedicated to this patient today, preparing to see patient, examining the patient, ordering tests and/or medications and counseling the patient, documenting clinical information in the EHR or other health record, independently interpreting results and communicating results to the patient/family, discussing treatment and goals, answering patient's questions and coordinating care.    Subjective:  John [name] is a 63 year old right-handed male with HTN who follows up for Parkinson disease.  History supplemented by his accompanying sister as well as hospital records.   UPDATE: carbidopa -levodopa  50/200mg  CR (4-5 AM, 8 AM, 11 AM, 2 PM, 5 PM, 8 PM); midodrine  5mg  BID; B6 50mg  daily, B1 100mg  daily, B12 1000mcg daily; Eliquis ; Norco;   At last visit, exam appeared stable.  Soon afterwards, he started experiencing increased falls.  Due to such an acute change, he was checked for a UTI which was negative.  He had not been exercising as recommended and was ordered PT, but before he could start, he was admitted in late June for metabolic encephalopathy in setting of AKI and bilateral PEs.  He has sustained multiple fractures due to falls.  He was discharged to SNF where he had another fall and possible seizure.    EEG on 7/2 was within normal limits.  He was discharged back to SNF but returned a few days later for more falls and  confusion.  Stated year was 17 and that he was 63 years old.  CT head showed chronic subdural hematomas.  Eliquis  was discontinued.  Surgery did not think intervention was warranted and as it was minimal, Eliquis  was restarted.  He was found to have critical cervical spinal stenosis but surgery was deferred until he was treated further with his PE.  Negative for pneumonia or UTI.  B12 level was 206 with normal MMA.  LTM EEG showed intermittent mild diffuse slowing but no epileptiform discharges or electrographic seizures.  He has continued experiencing falls and has sustained multiple fractures.  He has been found to be orthostatic and has been started on midodrine .  Resides at [facility].  Falls tend to occur at night when he tries to get out of bed.  He says he sometimes is confused about the time of day or needs to go to the restroom and nobody is able to tend to him fast enough.  His sister states that he hasn't been delirious and otherwise has been lucid.    Imaging: 06/10/2024 CT HEAD WO:  1. No acute intracranial abnormality. 2. Soft tissue swelling of the right forehead, likely related to the reported trauma. 06/10/2024 CT C-SPINE WO:  1. No evidence of fracture or acute traumatic injury. 2. 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 3. Moderate left neural foraminal stenosis at C7. 04/05/2024 CT HEAD WO:  1. Small right greater than left superior convexity Subdural Hematomas are stable to regressed since 03/29/2024, now up to  2-3 mm maximal thickness on the right side. No midline shift. 2. No new intracranial abnormality. 3. Right face subcutaneous hematoma or contusion redemonstrated. 03/29/2024 MRI BRAIN WO:  1. Subdural hematomas overlying the bilateral cerebral convexities (measuring up to 6 mm in thickness on the right and 4 mm in thickness on the left). Consider a repeat head CT to assess for any acute hemorrhage within the hematomas.  Balanced mass effect upon the underlying cerebral hemispheres without midline shift. 2. Mild diffuse dural thickening and enhancement overlying the bilateral cerebral convexities, and along the falx, nonspecific but possibly related to the subdural hematomas. 3. Small cluster of chronic microhemorrhages within the anterior left frontal lobe. 4. Mild chronic small vessel ischemic changes within the cerebral white matter. 03/29/2024 MRI C-SPINE W WO:  1. Cervical spondylosis as outlined within the body of the report. 2. 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 Most notably, there is severe stenosis of the right aspect of the spinal canal with spinal cord flattening. Additionally, there is an apparent small focus of signal abnormality with right aspect of the spinal cord at this level, which may reflect focal edema and/or myelomalacia. Bilateral neural foraminal narrowing (severe right, moderate-to-severe left). 3. No more than mild spinal canal stenosis at the remaining levels. 4. Additional sites of foraminal stenosis, greatest on the left at C6-C7 (moderate-to-severe at this site). 5. Mild patchy edema within the posterior elements, likely degenerative and related to facet arthropathy. 6. Suspected bridging osseous fusion across the C5-C6 disc space. 7. Bulky bridging ventral osteophyte at C3-C4. 8. Levocurvature of the cervical spine. 9. Mild grade 1 spondylolisthesis at C3-C4 and C4-C5.   03/29/2024 MRI T-SPINE W WO:  1. No signal abnormality identified within the thoracic spinal cord. No pathologic spinal cord enhancement. 2. Mild chronic anterior wedge deformities of the T1, T7, T11 and T12 vertebrae. 3. Thoracic spondylosis as outlined within the body of the report. No more than mild relative spinal canal narrowing. Moderate left neural foraminal narrowing at T3-T4. Disc degeneration is greatest at T6-T7, T7-T8, T8-T9 and T9-T10  (moderate-to-advanced, with possible early bridging osseous fusion across the disc spaces, at these levels). 4. Degenerative appearing edema and enhancement within the T12 vertebral body anteriorly (adjacent to a bulky T11-T12 ventral osteophyte). 5. Multilevel ventrolateral osteophytes/syndesmophytes, many of which are bridging. 03/29/2024 MRI L-SPINE W WO:  1. Partially imaged sacral edema and enhancement. An acute fracture of the S3 sacral segment was demonstrated on the prior CT chest/abdomen/pelvis of 03/10/2024. 2. Lumbar spondylosis as outlined within the body of the report. No significant central canal stenosis. No more than mild subarticular narrowing. Multilevel foraminal stenosis, greatest bilaterally at L5-S1 (mild-to-moderate right, moderate left). 3. Bulky multilevel ventrolateral osteophytes (some bridging). 4. Levocurvature of the lumbar spine. 5. Mild grade 1 retrolisthesis at T12-L1, L1-L2, L2-L3, L3-L4 and L5-S1. 03/10/2024 CT HEAD & C-SPINE WO:  1. No evidence of acute intracranial abnormality or cervical spine fracture. 2. Partially visualized acute right maxillary sinus fracture. A maxillofacial CT is recommended to allow full characterization and assessment for any additional facial fractures.  HISTORY: Since 2022, he started having balance problems with increased falls.  He was falling on a weekly basis.  Sometimes he loses his balance when his feet start moving quickly and the rest of his body cannot catch up.  He had a fall in which he bent over to pick something off of the ground and lost balance striking his  face on the concrete step and sustaining a laceration to the nose.  He was seen in the ED where CT head and face revealed generalized cerebral atrophy and remote lacunar infarct in the anterior limb of the right internal capsule but no fractures or acute intracranial abnormality.  He feels like he is moving slowly.  He has more difficulty writing.  His handwriting is smaller.  He  denies difficulty using utensils or chewing and swallowing.  He feels that he cannot speak loudly.  Denies tremor.  Denies REM sleep behavior disorder.  Sense of smell is good.  Has some chronic arthritic low back pain but no radicular pain or numbness in the legs.  Labs in November revealed B12 485, TSH 3.380 and non-reactive RPR.  No family history of Parkinson's disease or other neurologic disorder.  PAST MEDICAL HISTORY: Past Medical History:  Diagnosis Date   Bilateral pulmonary embolism (HCC) 03/16/2024   Hypertension    Parkinson disease (HCC) 03/16/2024   Parkinson's disease (HCC)    Stroke [facility])     MEDICATIONS: Current Outpatient Medications on File Prior to Visit  Medication Sig Dispense Refill   acetaminophen  (TYLENOL ) 500 MG tablet Take 1 tablet (500 mg total) by mouth every 6 (six) hours as needed for mild pain (pain score 1-3), moderate pain (pain score 4-6) or headache.     apixaban  (ELIQUIS ) 5 MG TABS tablet Take 2 tabs (10 mg total) twice a day through 03/18/2024.  From 03/19/2024, start taking 1 tab (5 Mg total) twice daily.     carbidopa -levodopa  (SINEMET  CR) 50-200 MG tablet Take 1 tablet by mouth 6 (six) times daily. (4-5 AM, 8 AM, 11 AM, 2 PM, 5 PM, 8 PM). 180 tablet 5   cyanocobalamin  1000 MCG tablet Take 1 tablet (1,000 mcg total) by mouth daily. 30 tablet 0   HYDROcodone -acetaminophen  (NORCO/VICODIN) 5-325 MG tablet Take 1 tablet by mouth every 8 (eight) hours as needed for severe pain (pain score 7-10). 5 tablet 0   midodrine  (PROAMATINE ) 5 MG tablet Take 1 tablet (5 mg total) by mouth 2 (two) times daily with a meal.     Multiple Vitamin (MULTIVITAMIN WITH MINERALS) TABS tablet Take 1 tablet by mouth daily. 30 tablet 0   omeprazole  (PRILOSEC  OTC) 20 MG tablet Take 20 mg by mouth daily.     pyridOXINE  (B-6) 50 MG tablet Take 1 tablet (50 mg total) by mouth daily.     thiamine  (VITAMIN B-1) 100 MG tablet Take 1 tablet (100 mg total) by mouth daily.     No current  facility-administered medications on file prior to visit.    ALLERGIES: Allergies  Allergen Reactions   Flomax  [Tamsulosin  Hcl] Anxiety and Other (See Comments)    Jittery     FAMILY HISTORY: Family History  Problem Relation Age of Onset   Cancer Father       Objective:  Blood pressure 135/75, pulse 97, height 6' 2 (1.88 m), weight 254 lb (115.2 kg), SpO2 96%. General: No acute distress.  Patient appears well-groomed.   Head:  Normocephalic/atraumatic Eyes:  Fundi examined but not visualized Neck: supple, no paraspinal tenderness, full range of motion Heart:  Regular rate and rhythm Neurological Exam:  Alert and oriented.  Speech fluent and not dysarthric.  Language intact.  Hypomimia.  Bradyphrenia.  CN II-XII intact.  Slight increased tone in elbows, mild increased tone in wrists.  Bradykinesia.  Muscle strength 5/5 throughout.  Resting, postural and kinetic tremor not observed.  Sensation  to light touch intact.  Deep tendon reflexes 2+ throughout.  Hoffman sign absent.  Babinski sign absent.  Finger to nose testing without dysmetria.  Upright posture, shuffling gait, reduced arm swing bilaterally.   Juliene Dunnings, DO  CC: Norleen Jobs, MD

## 2024-06-24 ENCOUNTER — Ambulatory Visit: Payer: Self-pay | Admitting: Psychology

## 2024-06-24 ENCOUNTER — Ambulatory Visit (INDEPENDENT_AMBULATORY_CARE_PROVIDER_SITE_OTHER): Admitting: Psychology

## 2024-06-24 ENCOUNTER — Encounter: Payer: Self-pay | Admitting: Psychology

## 2024-06-24 DIAGNOSIS — F0670 Mild neurocognitive disorder due to known physiological condition without behavioral disturbance: Secondary | ICD-10-CM | POA: Diagnosis not present

## 2024-06-24 DIAGNOSIS — G959 Disease of spinal cord, unspecified: Secondary | ICD-10-CM | POA: Insufficient documentation

## 2024-06-24 DIAGNOSIS — R4189 Other symptoms and signs involving cognitive functions and awareness: Secondary | ICD-10-CM

## 2024-06-24 NOTE — Progress Notes (Unsigned)
 NEUROPSYCHOLOGICAL EVALUATION [place]. [facility] Department of Neurology  Date of Evaluation: June 24, 2024  Reason for Referral:   DEQUAVIOUS HARSHBERGER is a 63 y.o. right-handed Caucasian male referred by Juliene Dunnings, D.O., to characterize his current cognitive functioning and assist with diagnostic clarity and treatment planning in the context of subjective cognitive decline.   Assessment and Plan:   Clinical Impression(s): Mr. Petrosian pattern of performance is suggestive of performance variability across processing speed and executive functioning. Isolated impairments were also exhibited across phonemic fluency and clock drawing. However, other performances surrounding expressive language (semantic fluency and confrontation naming) and visuospatial abilities were normatively appropriate respectively. Performances were also appropriate relative to age-matched peers across attention/concentration, receptive language, and all aspects of verbal learning and memory. Functionally, Mr. Vick Mr. Canterbury denied difficulties completing instrumental activities of daily living (ADLs) independently in the time prior to his June 2025 hospitalization. 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 However, the mild nature of this diagnosis should be emphasized at the present time.   ***. PD + AME  Recommendations: ***  A repeat neuropsychological evaluation in 12-18 months (or sooner if functional decline is noted) is recommended to assess the trajectory of future cognitive decline should it occur. This will also aid in future efforts towards improved diagnostic clarity.  Mr. Keltner has already been prescribed a medication aimed to address memory loss and concerns surrounding Alzheimer's disease (i.e., ***). he is encouraged to continue taking this medication  as prescribed. It is important to highlight that this medication has been shown to slow functional decline in some individuals. There is no current treatment which can stop or reverse cognitive decline when caused by a neurodegenerative illness.   A combination of medication and psychotherapy has been shown to be most effective at treating symptoms of anxiety and depression. As such, Mr. Raden is encouraged to speak with his prescribing physician regarding medication adjustments to optimally manage these symptoms. Likewise, Mr. Shatswell is encouraged to consider engaging in short-term psychotherapy to address symptoms of psychiatric distress. he would benefit from an active and collaborative therapeutic environment, rather than one purely supportive in nature. Recommended treatment modalities include Cognitive Behavioral Therapy (CBT) or Acceptance and Commitment Therapy (ACT).  Performance across neurocognitive testing is not a strong predictor of an individual's safety operating a motor vehicle. Should his family wish to pursue a formalized driving evaluation, they could reach out to the following agencies: [employer] [facility] in [place]: [phone] [facility]: [phone] [facility] [place]: [phone] [facility]: [phone] or [phone]  Should there be progression of current deficits over time, Mr. Brooking is unlikely to regain any independent living skills lost. Therefore, it is recommended that he remain as involved as possible in all aspects of household chores, finances, and medication management, with supervision to ensure adequate performance. he will likely benefit from the establishment and maintenance of a routine in order to maximize his functional abilities over time.  It will be important for Mr. Streett to have another person with him when in situations where he may need to process information, weigh the pros and cons of different options, and  make decisions, in order to ensure that he fully understands and recalls all information to be considered.  If not already done, Mr. Ducre and his family may want to discuss his wishes regarding durable power of attorney and medical  decision making, so that he can have input into these choices. If they require legal assistance with this, long-term care resource access, or other aspects of estate planning, they could reach out to [employer] at [phone] for a free consultation. Additionally, they may wish to discuss future plans for caretaking and seek out community options for in home/residential care should they become necessary.  Mr. Ganaway is encouraged to attend to lifestyle factors for brain health (e.g., regular physical exercise, good nutrition habits and consideration of the MIND-DASH diet, regular participation in cognitively-stimulating activities, and general stress management techniques), which are likely to have benefits for both emotional adjustment and cognition. In fact, in addition to promoting good general health, regular exercise incorporating aerobic activities (e.g., brisk walking, jogging, cycling, etc.) has been demonstrated to be a very effective treatment for depression and stress, with similar efficacy rates to both antidepressant medication and psychotherapy.   Optimal control of vascular risk factors (including safe cardiovascular exercise and adherence to dietary recommendations) is encouraged. Likewise, continued compliance with his CPAP machine will also be important. ***  Continued participation in activities which provide mental stimulation and social interaction is also recommended.   If interested, there are some activities which have therapeutic value and can be useful in keeping him cognitively stimulated. For suggestions, Mr. Brutus is encouraged to go to the following website:  https://www.[facility].org/get-to-know-barrow/centers-programs/neurorehabilitation-center/neuro-rehab-apps-and-games/ which has smart phone/tablet based options. It should be noted that these activities should not be viewed as a substitute for therapy.  Important information should be provided to Mr. Kuhnert in written format in all instances. This information should be placed in a highly frequented and easily visible location within his home to promote recall. External strategies such as written notes in a consistently used memory journal, visual and nonverbal auditory cues such as a calendar on the refrigerator or appointments with alarm, such as on a cell phone, can also help maximize recall.  Memory can be improved using internal strategies such as rehearsal, repetition, chunking, mnemonics, association, and imagery. External strategies such as written notes in a consistently used memory journal, visual and nonverbal auditory cues such as a calendar on the refrigerator or appointments with alarm, such as on a cell phone, can also help maximize recall.    When learning new information, he would benefit from information being broken up into small, manageable pieces. he may also find it helpful to articulate the material in his own words and in a context to promote encoding at the onset of a new task. This material may need to be repeated multiple times to promote encoding.  Because he shows better recall for structured information, he will likely understand and retain new information better if it is presented to him in a meaningful or well-organized manner at the outset, such as grouping items into meaningful categories or presenting information in an outlined, bulleted, or story format.  To address problems with processing speed, he may wish to consider:   -Ensuring that he is alerted when essential material or instructions are being presented   -Adjusting the speed at which new information is  presented   -Allowing for more time in comprehending, processing, and responding in conversation   -Repeating and paraphrasing instructions or conversations aloud  To address problems with fluctuating attention and/or executive dysfunction, he may wish to consider:   -Avoiding external distractions when needing to concentrate   -Limiting exposure to fast paced environments with multiple sensory demands   -Writing down complicated information  and using checklists   -Attempting and completing one task at a time (i.e., no multi-tasking)   -Verbalizing aloud each step of a task to maintain focus   -Taking frequent breaks during the completion of steps/tasks to avoid fatigue   -Reducing the amount of information considered at one time   -Scheduling more difficult activities for a time of day where he is usually most alert  {[id number]}  Review of Records:   Per medical records: Since 2022, he started having balance problems with increased falls.  He was falling on a weekly basis.  Sometimes he loses his balance when his feet start moving quickly and the rest of his body cannot catch up.  He had a fall in which he bent over to pick something off of the ground and lost balance striking his face on the concrete step and sustaining a laceration to the nose.  He was seen in the ED where CT head and face revealed generalized cerebral atrophy and remote lacunar infarct in the anterior limb of the right internal capsule but no fractures or acute intracranial abnormality.  He feels like he is moving slowly.  He has more difficulty writing.  His handwriting is smaller.  He denies difficulty using utensils or chewing and swallowing.  He feels that he cannot speak loudly.  Denies tremor.  Denies REM sleep behavior disorder.  Sense of smell is good.  Has some chronic arthritic low back pain but no radicular pain or numbness in the legs.  Labs in November revealed B12 485, TSH 3.380 and non-reactive RPR.   No family history of Parkinson's disease or other neurologic disorder.  Mr. Spengler most recently met with [facility] Lemuel Dunnings, D.O.) for follow-up on 06/21/2024. Per Dr Jayme documentation:  At last visit, exam appeared stable.  Soon afterwards, he started experiencing increased falls.  Due to such an acute change, he was checked for a UTI which was negative.  He had not been exercising as recommended and was ordered PT, but before he could start, he was admitted in late June for metabolic encephalopathy in setting of AKI and bilateral PEs.  He has sustained multiple fractures due to falls.  He was discharged to SNF where he had another fall and possible seizure.    EEG on 7/2 was within normal limits.  He was discharged back to SNF but returned a few days later for more falls and confusion.  Stated year was 37 and that he was 63 years old.  CT head showed chronic subdural hematomas.  Eliquis  was discontinued.  Surgery did not think intervention was warranted and as it was minimal, Eliquis  was restarted.  He was found to have critical cervical spinal stenosis but surgery was deferred until he was treated further with his PE.  Negative for pneumonia or UTI.  B12 level was 206 with normal MMA.  LTM EEG showed intermittent mild diffuse slowing but no epileptiform discharges or electrographic seizures.  He has continued experiencing falls and has sustained multiple fractures.  He has been found to be orthostatic and has been started on midodrine .  Resides at SNF.  Falls tend to occur at night when he tries to get out of bed.  He says he sometimes is confused about the time of day or needs to go to the restroom and nobody is able to tend to him fast enough.  His sister states that he hasn't been delirious and otherwise has been lucid.  Ultimately, Mr. Ewen was referred  for a comprehensive neuropsychological evaluation to characterize his cognitive abilities and to assist with diagnostic clarity and  treatment planning.   Past Medical History:  Diagnosis Date   Acute metabolic encephalopathy 03/24/2024   Acute pulmonary embolism 03/10/2024   AKI (acute kidney injury) 03/16/2024   ARF (acute renal failure) 03/10/2024   Bilateral pulmonary embolism 03/16/2024   BPH with urinary obstruction 03/24/2024   Cervical myelopathy    Essential hypertension 10/29/2021   Family history of prostate cancer 10/29/2021   Frequent falls 03/24/2024   Gastroesophageal reflux disease without esophagitis 10/29/2021   Hypokalemia 03/10/2024   Hypomagnesemia 03/24/2024   Hyponatremia 03/10/2024   Impairment of balance 03/12/2022   Laceration of head 03/24/2024   Lactic acidosis 03/16/2024   Lateral epicondylitis of right elbow 03/10/2024   Lymphedema 03/12/2022   Maxillary fracture 03/10/2024   Muscle weakness 03/12/2022   Neck pain 12/30/2023   Normocytic anemia 03/16/2024   Obesity (BMI 30-39.9) 03/24/2024   Pain in joint of left shoulder 06/24/2023   Pain in left knee 02/16/2024   Pain of lumbar spine 08/21/2020   Parkinson's disease with fluctuating manifestations 12/02/2022   Seizure 03/16/2024   Subdural hematoma 03/29/2024   Subdural hematomas overlying the bilateral cerebral convexities (measuring up to 6 mm in thickness on the right and 4 mm in thickness on the left). Consider a repeat head CT to assess for any acute hemorrhage within the hematomas. Balanced mass effect upon the underlying cerebral hemispheres without midline shift   Tinnitus of both ears 12/10/2023   Urinary tract infection 03/24/2024    Past Surgical History:  Procedure Laterality Date   RADIOLOGY WITH ANESTHESIA N/A 03/29/2024   Procedure: RADIOLOGY WITH ANESTHESIA;  Surgeon: Radiologist, Medication, MD;  Location: [facility] OR;  Service: Radiology;  Laterality: N/A;    Current Outpatient Medications:    acetaminophen  (TYLENOL ) 500 MG tablet, Take 1 tablet (500 mg total) by mouth every 6 (six) hours as needed for mild  pain (pain score 1-3), moderate pain (pain score 4-6) or headache., Disp: , Rfl:    apixaban  (ELIQUIS ) 5 MG TABS tablet, Take 2 tabs (10 mg total) twice a day through 03/18/2024.  From 03/19/2024, start taking 1 tab (5 Mg total) twice daily., Disp: , Rfl:    carbidopa -levodopa  (SINEMET  CR) 50-200 MG tablet, Take 1 tablet by mouth 6 (six) times daily. (4-5 AM, 8 AM, 11 AM, 2 PM, 5 PM, 8 PM)., Disp: 180 tablet, Rfl: 5   cyanocobalamin  1000 MCG tablet, Take 1 tablet (1,000 mcg total) by mouth daily., Disp: 30 tablet, Rfl: 0   HYDROcodone -acetaminophen  (NORCO/VICODIN) 5-325 MG tablet, Take 1 tablet by mouth every 8 (eight) hours as needed for severe pain (pain score 7-10)., Disp: 5 tablet, Rfl: 0   midodrine  (PROAMATINE ) 5 MG tablet, Take 1 tablet (5 mg total) by mouth 2 (two) times daily with a meal., Disp: , Rfl:    Multiple Vitamin (MULTIVITAMIN WITH MINERALS) TABS tablet, Take 1 tablet by mouth daily., Disp: 30 tablet, Rfl: 0   omeprazole  (PRILOSEC  OTC) 20 MG tablet, Take 20 mg by mouth daily., Disp: , Rfl:    pyridOXINE  (B-6) 50 MG tablet, Take 1 tablet (50 mg total) by mouth daily., Disp: , Rfl:    thiamine  (VITAMIN B-1) 100 MG tablet, Take 1 tablet (100 mg total) by mouth daily., Disp: , Rfl:      07/27/2021    8:18 AM  MMSE - Mini Mental State Exam  Orientation to time 5  Orientation to Place 5  Registration 3  Attention/ Calculation 4  Recall 3  Language- name 2 objects 2  Language- repeat 1  Language- follow 3 step command 3  Language- read & follow direction 1  Write a sentence 1  Copy design 1  Total score 29   Neuroimaging: 06/10/2024 CT HEAD WO:  1. No acute intracranial abnormality. 2. Soft tissue swelling of the right forehead, likely related to the reported trauma. 06/10/2024 CT C-SPINE WO:  1. No evidence of fracture or acute traumatic injury. 2. 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 3. Moderate left neural  foraminal stenosis at C7. 04/05/2024 CT HEAD WO:  1. Small right greater than left superior convexity Subdural Hematomas are stable to regressed since 03/29/2024, now up to 2-3 mm maximal thickness on the right side. No midline shift. 2. No new intracranial abnormality. 3. Right face subcutaneous hematoma or contusion redemonstrated. 03/29/2024 MRI BRAIN WO:  1. Subdural hematomas overlying the bilateral cerebral convexities (measuring up to 6 mm in thickness on the right and 4 mm in thickness on the left). Consider a repeat head CT to assess for any acute hemorrhage within the hematomas. Balanced mass effect upon the underlying cerebral hemispheres without midline shift. 2. Mild diffuse dural thickening and enhancement overlying the bilateral cerebral convexities, and along the falx, nonspecific but possibly related to the subdural hematomas. 3. Small cluster of chronic microhemorrhages within the anterior left frontal lobe. 4. Mild chronic small vessel ischemic changes within the cerebral white matter. 03/29/2024 MRI C-SPINE W WO:  1. Cervical spondylosis as outlined within the body of the report. 2. 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 Most notably, there is severe stenosis of the right aspect of the spinal canal with spinal cord flattening. Additionally, there is an apparent small focus of signal abnormality with right aspect of the spinal cord at this level, which may reflect focal edema and/or myelomalacia. Bilateral neural foraminal narrowing (severe right, moderate-to-severe left). 3. No more than mild spinal canal stenosis at the remaining levels. 4. Additional sites of foraminal stenosis, greatest on the left at C6-C7 (moderate-to-severe at this site). 5. Mild patchy edema within the posterior elements, likely degenerative and related to facet arthropathy. 6. Suspected bridging osseous fusion across the C5-C6 disc  space. 7. Bulky bridging ventral osteophyte at C3-C4. 8. Levocurvature of the cervical spine. 9. Mild grade 1 spondylolisthesis at C3-C4 and C4-C5.   03/29/2024 MRI T-SPINE W WO:  1. No signal abnormality identified within the thoracic spinal cord. No pathologic spinal cord enhancement. 2. Mild chronic anterior wedge deformities of the T1, T7, T11 and T12 vertebrae. 3. Thoracic spondylosis as outlined within the body of the report. No more than mild relative spinal canal narrowing. Moderate left neural foraminal narrowing at T3-T4. Disc degeneration is greatest at T6-T7, T7-T8, T8-T9 and T9-T10 (moderate-to-advanced, with possible early bridging osseous fusion across the disc spaces, at these levels). 4. Degenerative appearing edema and enhancement within the T12 vertebral body anteriorly (adjacent to a bulky T11-T12 ventral osteophyte). 5. Multilevel ventrolateral osteophytes/syndesmophytes, many of which are bridging. 03/29/2024 MRI L-SPINE W WO:  1. Partially imaged sacral edema and enhancement. An acute fracture of the S3 sacral segment was demonstrated on the prior CT chest/abdomen/pelvis of 03/10/2024. 2. Lumbar spondylosis as outlined within the body of the report. No significant central canal stenosis. No more than mild subarticular narrowing. Multilevel foraminal stenosis,  greatest bilaterally at L5-S1 (mild-to-moderate right, moderate left). 3. Bulky multilevel ventrolateral osteophytes (some bridging). 4. Levocurvature of the lumbar spine. 5. Mild grade 1 retrolisthesis at T12-L1, L1-L2, L2-L3, L3-L4 and L5-S1. 03/10/2024 CT HEAD & C-SPINE WO:  1. No evidence of acute intracranial abnormality or cervical spine fracture. 2. Partially visualized acute right maxillary sinus fracture. A maxillofacial CT is recommended to allow full characterization and assessment for any additional facial fractures.  Clinical Interview:   The following information was obtained during a clinical interview with Mr.  Haque prior to cognitive testing.  Cognitive Symptoms: Decreased short-term memory: Endorsed. He largely described generalized short-term memory concerns, as well as more longstanding and unchanged difficulties with name recollection. He acknowledged that his sister (who was not present) likely has greater concerns surrounding memory dysfunction. Overall, he did appear to somewhat downplay current memory concerns.  Decreased long-term memory: Denied. Decreased attention/concentration: Denied. However, he acknowledged that this could be due to him currently residing in a rehab hospital and not having much to challenge his attention or distractibility.  Reduced processing speed: Endorsed. Difficulties with executive functions: Denied. Difficulties with emotion regulation: Denied. Difficulties with receptive language: Denied. Difficulties with word finding: Denied. He remarked that he experiences mush mouth when first waking up in the morning. However, this dissipates relatively quickly and he denied any moment to moment issues during the day.  Decreased visuoperceptual ability: Denied.  Trajectory of deficits: Prior to Mr. Umland initial admission on 03/10/2024 as described above, Mr. Bihl largely denied any cognitive concerns, noting that he was working and with full independence. Since his initial illness, repeated falls, and subsequent hospitalizations, he reported a decline in overall cognitive functioning (e.g., feeling dumbed down to this level.).   Difficulties completing ADLs: Prior to Mr. Hochstetler initial admission on 03/10/2024 as described above, Mr. Coker largely denied any cognitive concerns, noting that he was working and with full independence. Given repeated hospitalizations and falls, he currently resides within a rehab hospital for ongoing treatment/care. Staff members manage current medications and he has not driven since his initial admission.   Additional Medical  History: History of traumatic brain injury/concussion: Given his history of repeated falls over the past several years, he did acknowledge instances where he may have struck his head. However, he denied any losses in consciousness or formal concussion diagnoses to his knowledge. With that being said, prior neuroimaging does suggest bilateral subdural hematomas concerning for the presence of head trauma.  History of stroke: Denied. History of seizure activity: Concern had been expressed for seizure activity during one of his hospitalizations preceding his initial admission in June 2025. Mr. Deeley denied a history of seizure activity prior to this concern.  History of known exposure to toxins: Denied. Symptoms of chronic pain: He reported acute on chronic pain surrounding his elbow and knees bilaterally.  Experience of frequent headaches/migraines: Denied. Frequent instances of dizziness/vertigo: Denied.  Sensory changes: He utilizes glasses for distance viewing with benefit. Other sensory difficulty/change (e.g., hearing, taste, smell) was denied.  Balance/coordination difficulties: Endorsed (see above). He largely denied experiences of dizziness or vertigo, as well as lower extremity weakness. When asked about repeated falls, he theorized that he has a tendency to attempt to go immediately after standing, leading to him losing his balance.  Other motor difficulties: Denied.  Sleep History: Estimated hours obtained each night: 5-6 hours.  Difficulties falling asleep: Denied. Difficulties staying asleep: Endorsed. He described his sleep as broken, noting that he may wake much earlier  than desired (and sometimes in the middle of the night) and be unable to go back to sleep.  Feels rested and refreshed upon awakening: Variably so depending on the quality and quantity of sleep obtained the night before.   History of snoring: Denied. History of waking up gasping for air: Denied. Witnessed breath  cessation while asleep: Denied.  History of vivid dreaming: Denied. Excessive movement while asleep: Denied. Instances of acting out his dreams: Denied.  Psychiatric/Behavioral Health History: Depression: He described his current mood as not too bad given the circumstances. He denied to his knowledge any prior mental health concerns or formal diagnoses. Current or remote suicidal ideation, intent, or plan was denied.  Anxiety: Denied. Mania: Denied. Trauma History: Denied. Visual/auditory hallucinations: Denied. Delusional thoughts: Denied.  Tobacco: Denied. Alcohol: He denied current alcohol consumption as well as a history of problematic alcohol abuse or dependence.  Recreational drugs: Denied.  Family History: Problem Relation Age of Onset   Cancer Father    This information was confirmed by Mr. Schaller.  Academic/Vocational History: Highest level of educational attainment: 16 years. He graduated from high school and described himself as an average (B) student in academic settings. Geometry was described as a relative weakness. He returned to school as an adult and then earned a Energy manager degree in applied science.  History of developmental delay: Denied. History of grade repetition: Denied. Enrollment in special education courses: Denied. History of LD/ADHD: Denied.  Employment: Unemployed. 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 He has not returned to work since and reported quitting due to medical complications. Prior to his hospitalization, no work performance difficulties were described.   Evaluation Results:   Behavioral Observations: Mr. Wich was unaccompanied, arrived to his appointment on time, and was appropriately dressed and groomed. He appeared alert. He ambulated with the assistance of a wheelchair. As such, gait and station was unable to be assessed. Gross motor functioning appeared intact upon informal  observation and no abnormal movements (e.g., tremors) were noted. His affect was generally relaxed and positive. Spontaneous speech was fluent and word finding difficulties were not observed during the clinical interview. Thought processes were coherent, organized, and normal in content. Insight into his cognitive difficulties appeared adequate.   During testing, sustained attention was appropriate. Task engagement was adequate and he persisted when challenged. Overall, Mr. Plasencia was cooperative with the clinical interview and subsequent testing procedures.   Adequacy of Effort: The validity of neuropsychological testing is limited by the extent to which the individual being tested may be assumed to have exerted adequate effort during testing. Mr. Iversen expressed his intention to perform to the best of his abilities and exhibited adequate task engagement and persistence. Scores across stand-alone and embedded performance validity measures were within expectation. As such, the results of the current evaluation are believed to be a valid representation of Mr. Cervenka current cognitive functioning.  Test Results: Mr. Alwin was fully oriented at the time of the current evaluation.  Intellectual abilities based upon educational and vocational attainment were estimated to be in the average range. Premorbid abilities were estimated to be within the above average range based upon a single-word reading test. Performance was also in the above average across a vocabulary based task.    Processing speed was variable, ranging from the well below average to average normative ranges. Basic attention was below average. More complex attention (e.g., working memory) was average. Executive functioning was variable, ranging from  the exceptionally low to average normative ranges.  While not directly assessed, receptive language abilities were believed to be intact. Mr. Garden did not exhibit any difficulties  comprehending task instructions and answered all questions asked of him appropriately. Assessed expressive language was variable. Phonemic fluency was exceptionally low to well below average, semantic fluency was below average to average, and confrontation naming was average.      Assessed visuospatial/visuoconstructional abilities were average outside of an isolated impairment across a clock drawing task. Points were lost on his drawing of a clock due to the inclusion of the number 12 twice, as well as slightly incorrect hand placement.    Learning (i.e., encoding) of novel verbal information was average to above average. Spontaneous delayed recall (i.e., retrieval) of previously learned information was mildly variable but overall appropriate, ranging from the below average to above average normative ranges. Retention rates were 71% across a list learning task, 117% across a story learning task, and 100% across a daily living task. Performance across recognition tasks was appropriate, suggesting evidence for information consolidation.   Results of emotional screening instruments suggested that recent symptoms of generalized anxiety were in the minimal range, while symptoms of depression were within normal limits. A screening instrument assessing recent sleep quality suggested the presence of minimal sleep dysfunction.  Table of Scores:   Note: This summary of test scores accompanies the interpretive report and should not be considered in isolation without reference to the appropriate sections in the text. Descriptors are based on appropriate normative data and may be adjusted based on clinical judgment. Terms such as Within Normal Limits and Outside Normal Limits are used when a more specific description of the test score cannot be determined.       Percentile - Normative Descriptor > 98 - Exceptionally High 91-97 - Well Above Average 75-90 - Above Average 25-74 - Average 9-24 - Below  Average 2-8 - Well Below Average < 2 - Exceptionally Low       Validity:   DESCRIPTOR       DCT: --- --- Within Normal Limits  NAB EVI: --- --- Within Normal Limits       Orientation:      Raw Score Percentile   NAB Orientation, Form 1 29/29 --- ---       Cognitive Screening:      Raw Score Percentile   SLUMS: 23/30 --- ---       Intellectual Functioning:      Standard Score Percentile   Test of Premorbid Functioning: 115 84 Above Average        Scaled Score Percentile   WAIS-5 Vocabulary: 12 75 Above Average  Memory:     NAB Memory Module, Form 1: Standard Score/ T Score Percentile   List Learning       Total Trials 1-3 22/36 (46) 34 Average    Short Delay Free Recall 7/12 (47) 38 Average    Long Delay Free Recall 5/12 (38) 12 Below Average    Retention Percentage 71 (39) 14 Below Average    Recognition Discriminability 5 (41) 18 Below Average  Story Learning       Immediate Recall 57/80 (44) 27 Average    Delayed Recall 34/40 (52) 58 Average    Retention Percentage 117 (67) 96 Well Above Average  Daily Living Memory       Immediate Recall 48/51 (62) 88 Above Average    Delayed Recall 17/17 (61) 86 Above Average    Retention Percentage  100 (61) 86 Above Average    Recognition Hits 10/10 (60) 84 Above Average       Attention/Executive Function:     Trail Making Test (TMT): Raw Score (T Score) Percentile     Part A 60 secs.,  1 error (31) 3 Well Below Average    Part B 106 secs.,  0 errors (37) 9 Below Average         Scaled Score Percentile   WAIS-5 Coding: 4 2 Well Below Average  WAIS-5 Naming Speed Quantity: 11 63 Average       NAB Attention Module, Form 1: T Score Percentile     Digits Forward 39 14 Below Average    Digits Backwards 49 46 Average        Scaled Score Percentile   WAIS-5 Similarities: 8 25 Average  WAIS-5 Matrix Reasoning: 11 63 Average  WAIS-5 Figure Weights: 11 63 Average       D-KEFS Verbal Fluency Test: Raw Score (Scaled Score)  Percentile     Letter Total Correct 20 (5) 5 Well Below Average    Category Total Correct 34 (10) 50 Average    Category Switching Total Correct 6 (2) <1 Exceptionally Low    Category Switching Accuracy 5 (4) 2 Well Below Average      Total Set Loss Errors 2 (10) 50 Average      Total Repetition Errors 0 (13) 84 Above Average       Language:     Verbal Fluency Test: Raw Score (T Score) Percentile     Phonemic Fluency (FAS) 20 (29) 2 Exceptionally Low    Animal Fluency 16 (40) 16 Below Average        NAB Language Module, Form 1: T Score Percentile     Naming 31/31 (54) 66 Average       Visuospatial/Visuoconstruction:      Raw Score Percentile   Clock Drawing: 6/10 --- Impaired       NAB Spatial Module, Form 1: T Score Percentile     Figure Drawing Copy 52 58 Average        Scaled Score Percentile   WAIS-5 Visual Puzzles: 11 63 Average       Mood and Personality:      Raw Score Percentile   Beck Depression Inventory - II: 8 --- Within Normal Limits  PROMIS Anxiety Questionnaire: 12 --- None to Slight       Additional Questionnaires:      Raw Score Percentile   PROMIS Sleep Disturbance Questionnaire: 22 --- None to Slight   Informed Consent and Coding/Compliance:   The current evaluation represents a clinical evaluation for the purposes previously outlined by the referral source and is in no way reflective of a forensic evaluation.   Mr. Riolo was provided with a verbal description of the nature and purpose of the present neuropsychological evaluation. Also reviewed were the foreseeable risks and/or discomforts and benefits of the procedure, limits of confidentiality, and mandatory reporting requirements of this provider. The patient was given the opportunity to ask questions and receive answers about the evaluation. Oral consent to participate was provided by the patient.   This evaluation was conducted by Arthea KYM Maryland, Ph.D., ABPP-CN, board certified clinical  neuropsychologist. Mr. Martel completed a clinical interview with Dr. Maryland, billed as one unit [phone], and 140 minutes of cognitive testing and scoring, billed as one unit [phone] and four additional units 96139. Psychometrist Lonell Jude, B.S. assisted Dr. Maryland with test administration  and scoring procedures. As a separate and discrete service, one unit [phone] and two units 96133 (160 minutes) were billed for Dr. Loralee time spent in interpretation and report writing.

## 2024-06-24 NOTE — Progress Notes (Signed)
   Psychometrician Note   Cognitive testing was administered to John [name] by Lonell Jude, B.S. (psychometrist) under the supervision of Dr. Arthea KYM Maryland, Ph.D., ABPP, licensed psychologist on 06/24/2024. John [name] did not appear overtly distressed by the testing session per behavioral observation or responses across self-report questionnaires. Rest breaks were offered.   The battery of tests administered was selected by Dr. Arthea KYM Maryland, Ph.D., ABPP with consideration to John [name] current level of functioning, the nature of his symptoms, emotional and behavioral responses during interview, level of literacy, observed level of motivation/effort, and the nature of the referral question. This battery was communicated to the psychometrist. Communication between Dr. Arthea KYM Maryland, Ph.D., ABPP and the psychometrist was ongoing throughout the evaluation and Dr. Arthea KYM Maryland, Ph.D., ABPP was immediately accessible at all times. Dr. Zachary C. Merz, Ph.D., ABPP provided supervision to the psychometrist on the date of this service to the extent necessary to assure the quality of all services provided.    John [name] will return within approximately 1-2 weeks for an interactive feedback session with Dr. Maryland at which time his test performances, clinical impressions, and treatment recommendations will be reviewed in detail. John [name] understands he can contact our office should he require our assistance before this time.  A total of 140 minutes of billable time were spent face-to-face with John [name] by the psychometrist. This includes both test administration and scoring time. Billing for these services is reflected in the clinical report generated by Dr. Arthea KYM Maryland, Ph.D., ABPP  This note reflects time spent with the psychometrician and does not include test scores or any clinical interpretations made by Dr. Maryland. The full report will follow in a separate note.

## 2024-07-01 ENCOUNTER — Ambulatory Visit: Admitting: Psychology

## 2024-07-01 DIAGNOSIS — F0670 Mild neurocognitive disorder due to known physiological condition without behavioral disturbance: Secondary | ICD-10-CM | POA: Diagnosis not present

## 2024-07-01 NOTE — Progress Notes (Signed)
   Neuropsychology Feedback Session John [name]. [facility] [place] Department of Neurology  Reason for Referral:   John [name] is a 63 y.o. right-handed Caucasian male referred by Juliene Dunnings, D.O., to characterize his current cognitive functioning and assist with diagnostic clarity and treatment planning in the context of subjective cognitive decline.   Feedback:   [name] completed a comprehensive neuropsychological evaluation on 06/24/2024. Please refer to that encounter for the full report and recommendations. Briefly, results suggested performance variability across processing speed and executive functioning. Isolated impairments were also exhibited across phonemic fluency and clock drawing. However, other performances surrounding expressive language (semantic fluency and confrontation naming) and visuospatial abilities respectively were normatively appropriate. Performances were also appropriate relative to age-matched peers across attention/concentration, receptive language, and all aspects of verbal learning and memory. The cause for ongoing dysfunction is likely multifactorial in nature. [name] pattern of deficits are quite common in individuals who have experienced acute metabolic encephalopathy and subsequent medical challenges. Cognitive deficits surrounding processing speed and executive functioning are quite common in this presentation and some individuals can experience persisting symptoms even following medical stabilization. Additionally, his testing patterns are also quite common in individuals with Parkinson's disease. [name] largely denied observed cognitive difficulties prior to his June 2025 hospitalization with the former condition. However, it remains quite plausible that his current clinical presentation is related to his history of Parkinson's disease exacerbated by recent medical complications.  [name] was accompanied by a care aide during the  current feedback session. Content of the current session focused on the results of his neuropsychological evaluation. [name] was given the opportunity to ask questions and his questions were answered. He was encouraged to reach out should additional questions arise. A copy of his report was provided at the conclusion of the visit.      One unit 96132 (33 minutes) was billed for Dr. Loralee time spent preparing for, conducting, and documenting the current feedback session with [name].

## 2024-07-06 ENCOUNTER — Emergency Department (HOSPITAL_COMMUNITY)

## 2024-07-06 ENCOUNTER — Other Ambulatory Visit: Payer: Self-pay

## 2024-07-06 ENCOUNTER — Emergency Department (HOSPITAL_COMMUNITY)
Admission: EM | Admit: 2024-07-06 | Discharge: 2024-07-06 | Disposition: A | Discharge status: Home / Self Care | Attending: Emergency Medicine | Admitting: Emergency Medicine

## 2024-07-06 DIAGNOSIS — I10 Essential (primary) hypertension: Secondary | ICD-10-CM | POA: Diagnosis not present

## 2024-07-06 DIAGNOSIS — Z7901 Long term (current) use of anticoagulants: Secondary | ICD-10-CM | POA: Diagnosis not present

## 2024-07-06 DIAGNOSIS — S0990XA Unspecified injury of head, initial encounter: Secondary | ICD-10-CM | POA: Diagnosis present

## 2024-07-06 DIAGNOSIS — W010XXA Fall on same level from slipping, tripping and stumbling without subsequent striking against object, initial encounter: Secondary | ICD-10-CM | POA: Diagnosis not present

## 2024-07-06 DIAGNOSIS — R109 Unspecified abdominal pain: Secondary | ICD-10-CM | POA: Insufficient documentation

## 2024-07-06 DIAGNOSIS — D72829 Elevated white blood cell count, unspecified: Secondary | ICD-10-CM | POA: Diagnosis not present

## 2024-07-06 DIAGNOSIS — W19XXXA Unspecified fall, initial encounter: Secondary | ICD-10-CM

## 2024-07-06 DIAGNOSIS — G20C Parkinsonism, unspecified: Secondary | ICD-10-CM | POA: Diagnosis not present

## 2024-07-06 LAB — CBC WITH DIFFERENTIAL/PLATELET
Abs Immature Granulocytes: 0.03 K/uL (ref 0.00–0.07)
Basophils Absolute: 0 K/uL (ref 0.0–0.1)
Basophils Relative: 0 %
Eosinophils Absolute: 0 K/uL (ref 0.0–0.5)
Eosinophils Relative: 0 %
HCT: 35.5 % — ABNORMAL LOW (ref 39.0–52.0)
Hemoglobin: 10.8 g/dL — ABNORMAL LOW (ref 13.0–17.0)
Immature Granulocytes: 0 %
Lymphocytes Relative: 8 %
Lymphs Abs: 0.9 K/uL (ref 0.7–4.0)
MCH: 25.7 pg — ABNORMAL LOW (ref 26.0–34.0)
MCHC: 30.4 g/dL (ref 30.0–36.0)
MCV: 84.5 fL (ref 80.0–100.0)
Monocytes Absolute: 1.3 K/uL — ABNORMAL HIGH (ref 0.1–1.0)
Monocytes Relative: 11 %
Neutro Abs: 10.1 K/uL — ABNORMAL HIGH (ref 1.7–7.7)
Neutrophils Relative %: 81 %
Platelets: 249 K/uL (ref 150–400)
RBC: 4.2 MIL/uL — ABNORMAL LOW (ref 4.22–5.81)
RDW: 16.2 % — ABNORMAL HIGH (ref 11.5–15.5)
WBC: 12.4 K/uL — ABNORMAL HIGH (ref 4.0–10.5)
nRBC: 0 % (ref 0.0–0.2)

## 2024-07-06 LAB — URINALYSIS, ROUTINE W REFLEX MICROSCOPIC
Bacteria, UA: NONE SEEN
Bilirubin Urine: NEGATIVE
Glucose, UA: NEGATIVE mg/dL
Hgb urine dipstick: NEGATIVE
Ketones, ur: 5 mg/dL — AB
Leukocytes,Ua: NEGATIVE
Nitrite: NEGATIVE
Protein, ur: 30 mg/dL — AB
Specific Gravity, Urine: 1.025 (ref 1.005–1.030)
pH: 6 (ref 5.0–8.0)

## 2024-07-06 LAB — COMPREHENSIVE METABOLIC PANEL WITH GFR
ALT: 8 U/L (ref 0–44)
AST: 26 U/L (ref 15–41)
Albumin: 3.5 g/dL (ref 3.5–5.0)
Alkaline Phosphatase: 79 U/L (ref 38–126)
Anion gap: 10 (ref 5–15)
BUN: 16 mg/dL (ref 8–23)
CO2: 21 mmol/L — ABNORMAL LOW (ref 22–32)
Calcium: 8.8 mg/dL — ABNORMAL LOW (ref 8.9–10.3)
Chloride: 106 mmol/L (ref 98–111)
Creatinine, Ser: 1.05 mg/dL (ref 0.61–1.24)
GFR, Estimated: >60 mL/min (ref >60)
Glucose, Bld: 121 mg/dL — ABNORMAL HIGH (ref 70–99)
Potassium: 3.4 mmol/L — ABNORMAL LOW (ref 3.5–5.1)
Sodium: 137 mmol/L (ref 135–145)
Total Bilirubin: 1.4 mg/dL — ABNORMAL HIGH (ref 0.0–1.2)
Total Protein: 6.7 g/dL (ref 6.5–8.1)

## 2024-07-06 LAB — LIPASE, BLOOD: Lipase: 20 U/L (ref 11–51)

## 2024-07-06 MED ORDER — ONDANSETRON 4 MG PO TBDP: 4.0000 mg | ORAL_TABLET | Freq: Once | ORAL | Status: DC

## 2024-07-06 NOTE — ED Provider Notes (Signed)
 [name] EM[facility]MENT AT [facility] Provider Note   CSN: [id number] Arrival date & time: 07/06/24  [id number]     Patient presents with: John [name] is a 63 y.o. male.   63 year old male presents today for concern of a fall.  He presents from [facility] home facility.  He states he was walking backwards when he tripped over a pillow.  Endorses head injury.  No loss of consciousness.  He is on a blood thinning medicine.  Endorses some abdominal pain which is not new and has been going on for a few days.  No other acute injury from today's fall.  The history is provided by the patient. No language interpreter was used.       Prior to Admission medications   Medication Sig Start Date End Date Taking? Authorizing Provider  acetaminophen  (TYLENOL ) 500 MG tablet Take 1 tablet (500 mg total) by mouth every 6 (six) hours as needed for mild pain (pain score 1-3), moderate pain (pain score 4-6) or headache. 03/16/24   Hongalgi, Anand D, MD  apixaban  (ELIQUIS ) 5 MG TABS tablet Take 2 tabs (10 mg total) twice a day through 03/18/2024.  From 03/19/2024, start taking 1 tab (5 Mg total) twice daily. 03/16/24   Hongalgi, Anand D, MD  carbidopa -levodopa  (SINEMET  CR) 50-200 MG tablet Take 1 tablet by mouth 6 (six) times daily. (4-5 AM, 8 AM, 11 AM, 2 PM, 5 PM, 8 PM). 12/08/23   Jaffe, Adam R, DO  cyanocobalamin  1000 MCG tablet Take 1 tablet (1,000 mcg total) by mouth daily. 03/23/24   Rashid, Farhan, MD  HYDROcodone -acetaminophen  (NORCO/VICODIN) 5-325 MG tablet Take 1 tablet by mouth every 8 (eight) hours as needed for severe pain (pain score 7-10). 04/16/24   Dennise Lavada MARLA, MD  midodrine  (PROAMATINE ) 5 MG tablet Take 1 tablet (5 mg total) by mouth 2 (two) times daily with a meal. 04/16/24   Singh, Prashant K, MD  Multiple Vitamin (MULTIVITAMIN WITH MINERALS) TABS tablet Take 1 tablet by mouth daily. 03/23/24   Rashid, Farhan, MD  omeprazole  (PRILOSEC  OTC) 20 MG tablet Take 20 mg  by mouth daily.    [provider]  pyridOXINE  (B-6) 50 MG tablet Take 1 tablet (50 mg total) by mouth daily. 04/17/24   Singh, Prashant K, MD  thiamine  (VITAMIN B-1) 100 MG tablet Take 1 tablet (100 mg total) by mouth daily. 04/17/24   Dennise Lavada MARLA, MD    Allergies: Flomax  [tamsulosin  hcl]    Review of Systems  Constitutional:  Negative for chills and fever.  Respiratory:  Negative for shortness of breath.   Gastrointestinal:  Positive for abdominal pain. Negative for nausea and vomiting.  Genitourinary:  Negative for dysuria.  Neurological:  Negative for light-headedness.  All other systems reviewed and are negative.   Updated Vital Signs BP 136/76 (BP Location: Right Arm)   Pulse 88   Temp 98.9 F (37.2 C) (Oral)   Resp 18   Ht 6' 2 (1.88 m)   Wt 115 kg   SpO2 96%   BMI 32.55 kg/m   Physical Exam Vitals and nursing note reviewed.  Constitutional:      General: He is not in acute distress.    Appearance: Normal appearance. He is not ill-appearing.  HENT:     Head: Normocephalic and atraumatic.     Nose: Nose normal.  Eyes:     Conjunctiva/sclera: Conjunctivae normal.  Cardiovascular:     Rate  and Rhythm: Normal rate and regular rhythm.  Pulmonary:     Effort: Pulmonary effort is normal. No respiratory distress.  Abdominal:     General: There is no distension.     Palpations: Abdomen is soft.     Tenderness: There is no abdominal tenderness. There is no right CVA tenderness, left CVA tenderness or guarding.  Musculoskeletal:        General: No deformity. Normal range of motion.     Cervical back: Normal range of motion.  Skin:    Findings: No rash.  Neurological:     Mental Status: He is alert.     (all labs ordered are listed, but only abnormal results are displayed) Labs Reviewed  CBC WITH DIFFERENTIAL/PLATELET  COMPREHENSIVE METABOLIC PANEL WITH GFR  LIPASE, BLOOD  URINALYSIS, ROUTINE W REFLEX MICROSCOPIC    EKG: None  Radiology: CT  Cervical Spine Wo Contrast Result Date: 07/06/2024 EXAM: CT CERVICAL SPINE WITHOUT CONTRAST 07/06/2024 08:07:19 AM TECHNIQUE: CT of the cervical spine was performed without the administration of intravenous contrast. Multiplanar reformatted images are provided for review. Automated exposure control, iterative reconstruction, and/or weight based adjustment of the mA/kV was utilized to reduce the radiation dose to as low as reasonably achievable. COMPARISON: Cervical spine CT 06/10/2024. CLINICAL HISTORY: 63 year old male. Polytrauma, blunt. Fall head and neck injury. FINDINGS: CERVICAL SPINE: BONES AND ALIGNMENT: No acute fracture or traumatic malalignment. Stable cervical lordosis. 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 SOFT TISSUES: Negative visible noncontrast thoracic inlet. No prevertebral soft tissue swelling. IMPRESSION: 1. No acute traumatic injury identified in the cervical spine. 2. 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 And moderate to severe chronic spinal stenosis at C3-C4. Electronically signed by: Helayne Hurst MD 07/06/2024 08:27 AM EDT RP Workstation: [id number]   CT Head Wo Contrast Result Date: 07/06/2024 EXAM: CT HEAD WITHOUT CONTRAST 07/06/2024 08:07:19 AM TECHNIQUE: CT of the head was performed without the administration of intravenous contrast. Automated exposure control, iterative reconstruction, and/or weight based adjustment of the mA/kV was utilized to reduce the radiation dose to as low as reasonably achievable. COMPARISON: Brain MRI 03/29/2024 and Head CT 06/10/2024. CLINICAL HISTORY: 63 year old male with polytrauma, blunt. Fall head  and neck injury. FINDINGS: BRAIN AND VENTRICLES: No acute hemorrhage. No evidence of acute infarct. No hydrocephalus. No mass effect or midline shift. Since July, no significant subdural hematoma by CT. Stable cerebral volume. Stable gray-white differentiation. Mild per age nonspecific chronic white matter changes. No suspicious intracranial vascular hyperdensity. ORBITS: No new orbital injury identified. SINUSES: No acute abnormality. SOFT TISSUES AND SKULL: Decreased forehead soft tissue swelling since last month. No new scalp soft tissue injury identified. No skull fracture. IMPRESSION: 1. No acute intracranial abnormality or acute traumatic injury identified. 2. Forehead soft tissue swelling decreased from prior. Electronically signed by: Helayne Hurst MD 07/06/2024 08:23 AM EDT RP Workstation: [id number]     Procedures   Medications Ordered in the ED - No data to display                                  Medical Decision Making Amount and/or Complexity of Data Reviewed Labs: ordered. Radiology: ordered.  Risk Prescription drug management.   Medical Decision Making / ED Course   This patient presents to the ED for concern of fall, abdominal pain, this involves an extensive number of treatment options, and is a complaint that carries with it a high risk of complications and morbidity.  The differential diagnosis includes head injury, fracture, contusion, gastroenteritis, constipation, UTI  MDM: 63 year old male presents after a fall.  No loss consciousness.  He is on a blood thinning medicine.  Will obtain CT head and CT C-spine.  Given his complaint of abdominal pain we will also obtain labs and a UA.  CBC with mild leukocytosis with no significant left shift.  No significant anemia.  CMP without acute concern.  Lipase within normal.  UA without evidence of UTI.  Repeat abdominal exam is benign.  No distention, no tenderness, and without guarding.  Do not feel it warrants further CT  imaging given reassuring blood work, and reassuring exam. Return precautions discussed.  CT head and C-spine without acute findings.  Patient is appropriate for discharge back to nursing home.   Additional history obtained: -Additional history obtained from chart review -External records from outside source obtained and reviewed including: Chart review including previous notes, labs, imaging, consultation notes   Lab Tests: -I ordered, reviewed, and interpreted labs.   The pertinent results include:   Labs Reviewed  CBC WITH DIFFERENTIAL/PLATELET  COMPREHENSIVE METABOLIC PANEL WITH GFR  LIPASE, BLOOD  URINALYSIS, ROUTINE W REFLEX MICROSCOPIC      EKG  EKG Interpretation Date/Time:    Ventricular Rate:    PR Interval:    QRS Duration:    QT Interval:    QTC Calculation:   R Axis:      Text Interpretation:           Imaging Studies ordered: I ordered imaging studies including CT head, CT C-spine I independently visualized and interpreted imaging. I agree with the radiologist interpretation   Medicines ordered and prescription drug management: No orders of the defined types were placed in this encounter.   -I have reviewed the patients home medicines and have made adjustments as needed Reevaluation: After the interventions noted above, I reevaluated the patient and found that they have :improved  Co morbidities that complicate the patient evaluation  Past Medical History:  Diagnosis Date   Acute metabolic encephalopathy 03/24/2024   Acute pulmonary embolism 03/10/2024   AKI (acute kidney injury) 03/16/2024   ARF (acute renal failure) 03/10/2024   Bilateral pulmonary embolism 03/16/2024   BPH with urinary obstruction 03/24/2024   Cervical myelopathy    Essential hypertension 10/29/2021   Family history of prostate cancer 10/29/2021   Frequent falls 03/24/2024   Gastroesophageal reflux disease without esophagitis 10/29/2021   Hypokalemia 03/10/2024    Hypomagnesemia 03/24/2024   Hyponatremia 03/10/2024   Impairment of balance 03/12/2022   Laceration of head 03/24/2024   Lactic acidosis 03/16/2024   Lateral epicondylitis of right elbow 03/10/2024   Lymphedema 03/12/2022   Maxillary fracture 03/10/2024   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 Consider a repeat head CT to assess for any acute hemorrhage within the hematomas. Balanced mass effect upon the underlying cerebral hemispheres without midline shift   Tinnitus of both ears 12/10/2023   Urinary tract infection 03/24/2024      Dispostion: Discharged in stable condition.  Return precaution discussed.  Patient voices understanding and is in agreement with plan.   Final diagnoses:  Fall, initial encounter  Injury of head, initial encounter  Abdominal pain, unspecified abdominal location    ED Discharge Orders     None          Hildegard Loge, PA-C 07/06/24 1113    Garrick Charleston, MD 07/07/24 [phone]

## 2024-07-06 NOTE — ED Notes (Signed)
 CALLED [facility] PER MEGAN ETA ON THE WAY

## 2024-07-06 NOTE — ED Notes (Signed)
[facility] called, eta "within the hour"

## 2024-07-06 NOTE — Discharge Instructions (Signed)
 CT scan of the head and neck did not show any fractures or intracranial bleed or other concerning finding.  Blood work did not show any worrisome findings.  Urine had no evidence of infection.  Your exam was reassuring.  Return for any concerning symptoms.

## 2024-07-08 ENCOUNTER — Encounter (HOSPITAL_COMMUNITY): Payer: Self-pay

## 2024-07-08 ENCOUNTER — Other Ambulatory Visit: Payer: Self-pay

## 2024-07-08 ENCOUNTER — Inpatient Hospital Stay (HOSPITAL_COMMUNITY)
Admission: EM | Admit: 2024-07-08 | Discharge: 2024-07-15 | DRG: 388 | Disposition: A | Discharge status: Skilled Nursing Facility | Source: Skilled Nursing Facility | Attending: Internal Medicine | Admitting: Internal Medicine

## 2024-07-08 ENCOUNTER — Emergency Department (HOSPITAL_COMMUNITY)

## 2024-07-08 DIAGNOSIS — M4802 Spinal stenosis, cervical region: Secondary | ICD-10-CM | POA: Diagnosis present

## 2024-07-08 DIAGNOSIS — K2210 Ulcer of esophagus without bleeding: Secondary | ICD-10-CM

## 2024-07-08 DIAGNOSIS — Z7901 Long term (current) use of anticoagulants: Secondary | ICD-10-CM

## 2024-07-08 DIAGNOSIS — Z86711 Personal history of pulmonary embolism: Secondary | ICD-10-CM

## 2024-07-08 DIAGNOSIS — G3184 Mild cognitive impairment, so stated: Secondary | ICD-10-CM | POA: Diagnosis present

## 2024-07-08 DIAGNOSIS — K920 Hematemesis: Secondary | ICD-10-CM | POA: Diagnosis not present

## 2024-07-08 DIAGNOSIS — R296 Repeated falls: Secondary | ICD-10-CM | POA: Diagnosis present

## 2024-07-08 DIAGNOSIS — I10 Essential (primary) hypertension: Secondary | ICD-10-CM | POA: Diagnosis present

## 2024-07-08 DIAGNOSIS — K56699 Other intestinal obstruction unspecified as to partial versus complete obstruction: Principal | ICD-10-CM | POA: Diagnosis present

## 2024-07-08 DIAGNOSIS — Z87891 Personal history of nicotine dependence: Secondary | ICD-10-CM | POA: Diagnosis not present

## 2024-07-08 DIAGNOSIS — D62 Acute posthemorrhagic anemia: Secondary | ICD-10-CM | POA: Diagnosis not present

## 2024-07-08 DIAGNOSIS — Z6828 Body mass index (BMI) 28.0-28.9, adult: Secondary | ICD-10-CM

## 2024-07-08 DIAGNOSIS — E669 Obesity, unspecified: Secondary | ICD-10-CM | POA: Diagnosis present

## 2024-07-08 DIAGNOSIS — K921 Melena: Secondary | ICD-10-CM

## 2024-07-08 DIAGNOSIS — N401 Enlarged prostate with lower urinary tract symptoms: Secondary | ICD-10-CM | POA: Diagnosis present

## 2024-07-08 DIAGNOSIS — Z560 Unemployment, unspecified: Secondary | ICD-10-CM | POA: Diagnosis not present

## 2024-07-08 DIAGNOSIS — K810 Acute cholecystitis: Secondary | ICD-10-CM | POA: Diagnosis present

## 2024-07-08 DIAGNOSIS — G20B2 Parkinson's disease with dyskinesia, with fluctuations: Secondary | ICD-10-CM | POA: Diagnosis not present

## 2024-07-08 DIAGNOSIS — E872 Acidosis, unspecified: Secondary | ICD-10-CM | POA: Diagnosis present

## 2024-07-08 DIAGNOSIS — K2101 Gastro-esophageal reflux disease with esophagitis, with bleeding: Secondary | ICD-10-CM | POA: Diagnosis present

## 2024-07-08 DIAGNOSIS — I2699 Other pulmonary embolism without acute cor pulmonale: Secondary | ICD-10-CM | POA: Diagnosis not present

## 2024-07-08 DIAGNOSIS — K449 Diaphragmatic hernia without obstruction or gangrene: Secondary | ICD-10-CM | POA: Diagnosis present

## 2024-07-08 DIAGNOSIS — R55 Syncope and collapse: Secondary | ICD-10-CM | POA: Diagnosis not present

## 2024-07-08 DIAGNOSIS — G20A2 Parkinson's disease without dyskinesia, with fluctuations: Secondary | ICD-10-CM | POA: Diagnosis present

## 2024-07-08 DIAGNOSIS — E876 Hypokalemia: Secondary | ICD-10-CM | POA: Diagnosis present

## 2024-07-08 DIAGNOSIS — K209 Esophagitis, unspecified without bleeding: Secondary | ICD-10-CM | POA: Diagnosis not present

## 2024-07-08 DIAGNOSIS — N179 Acute kidney failure, unspecified: Secondary | ICD-10-CM | POA: Diagnosis present

## 2024-07-08 DIAGNOSIS — K219 Gastro-esophageal reflux disease without esophagitis: Secondary | ICD-10-CM | POA: Diagnosis not present

## 2024-07-08 DIAGNOSIS — Z8042 Family history of malignant neoplasm of prostate: Secondary | ICD-10-CM | POA: Diagnosis not present

## 2024-07-08 DIAGNOSIS — K922 Gastrointestinal hemorrhage, unspecified: Secondary | ICD-10-CM | POA: Diagnosis present

## 2024-07-08 DIAGNOSIS — R195 Other fecal abnormalities: Secondary | ICD-10-CM | POA: Diagnosis not present

## 2024-07-08 DIAGNOSIS — K56609 Unspecified intestinal obstruction, unspecified as to partial versus complete obstruction: Principal | ICD-10-CM | POA: Diagnosis present

## 2024-07-08 DIAGNOSIS — I3139 Other pericardial effusion (noninflammatory): Secondary | ICD-10-CM | POA: Diagnosis present

## 2024-07-08 DIAGNOSIS — K2100 Gastro-esophageal reflux disease with esophagitis, without bleeding: Secondary | ICD-10-CM | POA: Diagnosis not present

## 2024-07-08 DIAGNOSIS — R2689 Other abnormalities of gait and mobility: Secondary | ICD-10-CM | POA: Diagnosis present

## 2024-07-08 DIAGNOSIS — D649 Anemia, unspecified: Secondary | ICD-10-CM | POA: Diagnosis not present

## 2024-07-08 LAB — CBC WITH DIFFERENTIAL/PLATELET
Abs Immature Granulocytes: 0.04 K/uL (ref 0.00–0.07)
Basophils Absolute: 0 K/uL (ref 0.0–0.1)
Basophils Relative: 0 %
Eosinophils Absolute: 0.1 K/uL (ref 0.0–0.5)
Eosinophils Relative: 1 %
HCT: 31.3 % — ABNORMAL LOW (ref 39.0–52.0)
Hemoglobin: 9.5 g/dL — ABNORMAL LOW (ref 13.0–17.0)
Immature Granulocytes: 0 %
Lymphocytes Relative: 22 %
Lymphs Abs: 2.7 K/uL (ref 0.7–4.0)
MCH: 25.4 pg — ABNORMAL LOW (ref 26.0–34.0)
MCHC: 30.4 g/dL (ref 30.0–36.0)
MCV: 83.7 fL (ref 80.0–100.0)
Monocytes Absolute: 1.4 K/uL — ABNORMAL HIGH (ref 0.1–1.0)
Monocytes Relative: 12 %
Neutro Abs: 8.1 K/uL — ABNORMAL HIGH (ref 1.7–7.7)
Neutrophils Relative %: 65 %
Platelets: 363 K/uL (ref 150–400)
RBC: 3.74 MIL/uL — ABNORMAL LOW (ref 4.22–5.81)
RDW: 16.4 % — ABNORMAL HIGH (ref 11.5–15.5)
WBC: 12.4 K/uL — ABNORMAL HIGH (ref 4.0–10.5)
nRBC: 0 % (ref 0.0–0.2)

## 2024-07-08 LAB — COMPREHENSIVE METABOLIC PANEL WITH GFR
ALT: 6 U/L (ref 0–44)
AST: 38 U/L (ref 15–41)
Albumin: 3.2 g/dL — ABNORMAL LOW (ref 3.5–5.0)
Alkaline Phosphatase: 74 U/L (ref 38–126)
Anion gap: 19 — ABNORMAL HIGH (ref 5–15)
BUN: 53 mg/dL — ABNORMAL HIGH (ref 8–23)
CO2: 16 mmol/L — ABNORMAL LOW (ref 22–32)
Calcium: 8.7 mg/dL — ABNORMAL LOW (ref 8.9–10.3)
Chloride: 102 mmol/L (ref 98–111)
Creatinine, Ser: 1.7 mg/dL — ABNORMAL HIGH (ref 0.61–1.24)
GFR, Estimated: 45 mL/min — ABNORMAL LOW (ref >60)
Glucose, Bld: 197 mg/dL — ABNORMAL HIGH (ref 70–99)
Potassium: 3 mmol/L — ABNORMAL LOW (ref 3.5–5.1)
Sodium: 137 mmol/L (ref 135–145)
Total Bilirubin: 1 mg/dL (ref 0.0–1.2)
Total Protein: 6.9 g/dL (ref 6.5–8.1)

## 2024-07-08 LAB — I-STAT CHEM 8, ED
BUN: 49 mg/dL — ABNORMAL HIGH (ref 8–23)
Calcium, Ion: 1.07 mmol/L — ABNORMAL LOW (ref 1.15–1.40)
Chloride: 104 mmol/L (ref 98–111)
Creatinine, Ser: 1.7 mg/dL — ABNORMAL HIGH (ref 0.61–1.24)
Glucose, Bld: 202 mg/dL — ABNORMAL HIGH (ref 70–99)
HCT: 33 % — ABNORMAL LOW (ref 39.0–52.0)
Hemoglobin: 11.2 g/dL — ABNORMAL LOW (ref 13.0–17.0)
Potassium: 3 mmol/L — ABNORMAL LOW (ref 3.5–5.1)
Sodium: 137 mmol/L (ref 135–145)
TCO2: 17 mmol/L — ABNORMAL LOW (ref 22–32)

## 2024-07-08 LAB — CBC
HCT: 27.9 % — ABNORMAL LOW (ref 39.0–52.0)
Hemoglobin: 8.6 g/dL — ABNORMAL LOW (ref 13.0–17.0)
MCH: 25.2 pg — ABNORMAL LOW (ref 26.0–34.0)
MCHC: 30.8 g/dL (ref 30.0–36.0)
MCV: 81.8 fL (ref 80.0–100.0)
Platelets: 314 K/uL (ref 150–400)
RBC: 3.41 MIL/uL — ABNORMAL LOW (ref 4.22–5.81)
RDW: 16.2 % — ABNORMAL HIGH (ref 11.5–15.5)
WBC: 9.1 K/uL (ref 4.0–10.5)
nRBC: 0 % (ref 0.0–0.2)

## 2024-07-08 LAB — ABO/RH: ABO/RH(D): B POS

## 2024-07-08 LAB — APTT: aPTT: 33 s (ref 24–36)

## 2024-07-08 LAB — I-STAT CG4 LACTIC ACID, ED
Lactic Acid, Venous: 6.1 mmol/L (ref 0.5–1.9)
Lactic Acid, Venous: 1.3 mmol/L (ref 0.5–1.9)

## 2024-07-08 LAB — CBG MONITORING, ED: Glucose-Capillary: 223 mg/dL — ABNORMAL HIGH (ref 70–99)

## 2024-07-08 LAB — PROTIME-INR
INR: 1.8 — ABNORMAL HIGH (ref 0.8–1.2)
Prothrombin Time: 21.6 s — ABNORMAL HIGH (ref 11.4–15.2)

## 2024-07-08 LAB — FIBRINOGEN: Fibrinogen: >800 mg/dL — ABNORMAL HIGH (ref 210–475)

## 2024-07-08 LAB — MAGNESIUM: Magnesium: 2 mg/dL (ref 1.7–2.4)

## 2024-07-08 LAB — POC OCCULT BLOOD, ED: Fecal Occult Bld: POSITIVE — AB

## 2024-07-08 LAB — LIPASE, BLOOD: Lipase: 16 U/L (ref 11–51)

## 2024-07-08 MED ORDER — PIPERACILLIN-TAZOBACTAM 3.375 G IVPB 30 MIN
3.3750 g | Freq: Once | INTRAVENOUS | Status: AC
  Administered 2024-07-08: 3.375 g via INTRAVENOUS
  Filled 2024-07-08: qty 50

## 2024-07-08 MED ORDER — PROCHLORPERAZINE EDISYLATE 10 MG/2ML IJ SOLN
5.0000 mg | INTRAMUSCULAR | Status: DC | PRN
  Administered 2024-07-09: 5 mg via INTRAVENOUS
  Filled 2024-07-08: qty 2

## 2024-07-08 MED ORDER — PANTOPRAZOLE SODIUM 40 MG IV SOLR
40.0000 mg | Freq: Two times a day (BID) | INTRAVENOUS | Status: DC
  Administered 2024-07-09 – 2024-07-12 (×8): 40 mg via INTRAVENOUS
  Filled 2024-07-08 (×8): qty 10

## 2024-07-08 MED ORDER — PROCHLORPERAZINE EDISYLATE 10 MG/2ML IJ SOLN
5.0000 mg | Freq: Once | INTRAMUSCULAR | Status: AC | PRN
  Administered 2024-07-08: 5 mg via INTRAVENOUS
  Filled 2024-07-08: qty 2

## 2024-07-08 MED ORDER — LACTATED RINGERS IV BOLUS
1000.0000 mL | Freq: Once | INTRAVENOUS | Status: AC
  Administered 2024-07-08: 1000 mL via INTRAVENOUS

## 2024-07-08 MED ORDER — POTASSIUM CHLORIDE IN NACL 20-0.9 MEQ/L-% IV SOLN
INTRAVENOUS | Status: AC
  Filled 2024-07-08 (×2): qty 1000

## 2024-07-08 MED ORDER — IOHEXOL 350 MG/ML SOLN
80.0000 mL | Freq: Once | INTRAVENOUS | Status: AC | PRN
Start: 2024-07-08 — End: 2024-07-08
  Administered 2024-07-08: 80 mL via INTRAVENOUS

## 2024-07-08 MED ORDER — HYDROMORPHONE HCL 1 MG/ML IJ SOLN
0.5000 mg | INTRAMUSCULAR | Status: DC | PRN
  Administered 2024-07-09 – 2024-07-11 (×2): 0.5 mg via INTRAVENOUS
  Filled 2024-07-08 (×2): qty 1

## 2024-07-08 MED ORDER — SODIUM CHLORIDE 0.9 % IV BOLUS
1000.0000 mL | Freq: Once | INTRAVENOUS | Status: AC
  Administered 2024-07-08: 1000 mL via INTRAVENOUS

## 2024-07-08 MED ORDER — SODIUM CHLORIDE 0.9% FLUSH
3.0000 mL | Freq: Two times a day (BID) | INTRAVENOUS | Status: DC
  Administered 2024-07-08 – 2024-07-15 (×12): 3 mL via INTRAVENOUS

## 2024-07-08 MED ORDER — ACETAMINOPHEN 650 MG RE SUPP: 650.0000 mg | Freq: Four times a day (QID) | RECTAL | Status: DC | PRN

## 2024-07-08 MED ORDER — PIPERACILLIN-TAZOBACTAM 3.375 G IVPB
3.3750 g | Freq: Three times a day (TID) | INTRAVENOUS | Status: DC
  Administered 2024-07-09: 3.375 g via INTRAVENOUS
  Filled 2024-07-08 (×3): qty 50

## 2024-07-08 MED ORDER — POTASSIUM CHLORIDE 10 MEQ/100ML IV SOLN
10.0000 meq | Freq: Once | INTRAVENOUS | Status: AC
  Administered 2024-07-08: 10 meq via INTRAVENOUS
  Filled 2024-07-08: qty 100

## 2024-07-08 MED ORDER — ACETAMINOPHEN 325 MG PO TABS: 650.0000 mg | ORAL_TABLET | Freq: Four times a day (QID) | ORAL | Status: DC | PRN

## 2024-07-08 MED ORDER — PANTOPRAZOLE SODIUM 40 MG IV SOLR
40.0000 mg | Freq: Once | INTRAVENOUS | Status: AC
  Administered 2024-07-08: 40 mg via INTRAVENOUS
  Filled 2024-07-08: qty 10

## 2024-07-08 NOTE — H&P (Addendum)
 History and Physical    John [name] FMW:[id number] DOB: 02/02/1961 DOA: 07/08/2024  PCP: [name], MD   Patient coming from: SNF   Chief Complaint: Dark tarry stool, dark emesis, abdominal pain   HPI: John [name] is a 63 y.o. male with medical history significant for Parkinson disease, mild cognitive impairment, PE in June 2025 on Eliquis , and BPH who presents with dark tarry stool, dark emesis, and abdominal pain.  Patient reports that he developed abdominal pain yesterday.  This has been associated with nausea, loose dark, tarry stools, and then dark emesis today.  ED Course: Upon arrival to the ED, patient is found to be afebrile and saturating well on room air with mild tachycardia and SBP as low as 78 at one point.  Labs are most notable for BUN 53, creatinine 1.70, potassium 3.0, serum bicarbonate 16, anion gap 19, WBC 12,400, hemoglobin 9.5, lactate 6.1, and positive fecal occult blood testing.  CT angiography is negative for acute GI bleeding but concerning for SBO and gallbladder wall thickening with pericholecystic fat stranding, fluid-filled distal esophagus, and small pericardial effusion.  Surgery was consulted by the ED for physician and the patient was given 2 L LR, IV potassium, Compazine , and IV Protonix .  Review of Systems:  All other systems reviewed and apart from HPI, are negative.  Past Medical History:  Diagnosis Date   Acute metabolic encephalopathy 03/24/2024   Acute pulmonary embolism 03/10/2024   AKI (acute kidney injury) 03/16/2024   ARF (acute renal failure) 03/10/2024   Bilateral pulmonary embolism 03/16/2024   BPH with urinary obstruction 03/24/2024   Cervical myelopathy    Essential hypertension 10/29/2021   Family history of prostate cancer 10/29/2021   Frequent falls 03/24/2024   Gastroesophageal reflux disease without esophagitis 10/29/2021   Hypokalemia 03/10/2024   Hypomagnesemia 03/24/2024   Hyponatremia 03/10/2024    Impairment of balance 03/12/2022   Laceration of head 03/24/2024   Lactic acidosis 03/16/2024   Lateral epicondylitis of right elbow 03/10/2024   Lymphedema 03/12/2022   Maxillary fracture 03/10/2024   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 Consider a repeat head CT to assess for any acute hemorrhage within the hematomas. Balanced mass effect upon the underlying cerebral hemispheres without midline shift   Tinnitus of both ears 12/10/2023   Urinary tract infection 03/24/2024    Past Surgical History:  Procedure Laterality Date   RADIOLOGY WITH ANESTHESIA N/A 03/29/2024   Procedure: RADIOLOGY WITH ANESTHESIA;  Surgeon: Radiologist, Medication, MD;  Location: [facility] OR;  Service: Radiology;  Laterality: N/A;    Social History:   reports that he quit smoking about 25 years ago. His smoking use included cigars. He has never used smokeless tobacco. He reports that he does not currently use alcohol. He reports that he does not use drugs.  Allergies  Allergen Reactions   Flomax  [Tamsulosin  Hcl] Anxiety and Other (See Comments)    Jittery     Family History  Problem Relation  Age of Onset   Cancer Father      Prior to Admission medications   Medication Sig Start Date End Date Taking? Authorizing Provider  acetaminophen  (TYLENOL ) 500 MG tablet Take 1 tablet (500 mg total) by mouth every 6 (six) hours as needed for mild pain (pain score 1-3), moderate pain (pain score 4-6) or  headache. 03/16/24   Hongalgi, Anand D, MD  apixaban  (ELIQUIS ) 5 MG TABS tablet Take 2 tabs (10 mg total) twice a day through 03/18/2024.  From 03/19/2024, start taking 1 tab (5 Mg total) twice daily. 03/16/24   Hongalgi, Anand D, MD  carbidopa -levodopa  (SINEMET  CR) 50-200 MG tablet Take 1 tablet by mouth 6 (six) times daily. (4-5 AM, 8 AM, 11 AM, 2 PM, 5 PM, 8 PM). 12/08/23   Jaffe, Adam R, DO  cyanocobalamin  1000 MCG tablet Take 1 tablet (1,000 mcg total) by mouth daily. 03/23/24   Rashid, Farhan, MD  HYDROcodone -acetaminophen  (NORCO/VICODIN) 5-325 MG tablet Take 1 tablet by mouth every 8 (eight) hours as needed for severe pain (pain score 7-10). 04/16/24   Dennise Lavada POUR, MD  midodrine  (PROAMATINE ) 5 MG tablet Take 1 tablet (5 mg total) by mouth 2 (two) times daily with a meal. 04/16/24   Singh, Prashant K, MD  Multiple Vitamin (MULTIVITAMIN WITH MINERALS) TABS tablet Take 1 tablet by mouth daily. 03/23/24   Rashid, Farhan, MD  omeprazole  (PRILOSEC  OTC) 20 MG tablet Take 20 mg by mouth daily.    [provider]  pyridOXINE  (B-6) 50 MG tablet Take 1 tablet (50 mg total) by mouth daily. 04/17/24   Singh, Prashant K, MD  thiamine  (VITAMIN B-1) 100 MG tablet Take 1 tablet (100 mg total) by mouth daily. 04/17/24   Dennise Lavada POUR, MD    Physical Exam: Vitals:   07/08/24 1830 07/08/24 1840 07/08/24 1850 07/08/24 1921  BP: (!) 153/80 (!) 140/85 120/84   Pulse: (!) 116 (!) 109 (!) 101   Resp: 19 20 17    Temp:    98.1 F (36.7 C)  TempSrc:    Oral  SpO2: 100% 100% 97%     Constitutional: NAD, no pallor or diaphoresis   Eyes: PERTLA, lids and conjunctivae normal ENMT: Mucous membranes are moist. Posterior pharynx clear of any exudate or lesions.   Neck: supple, no masses  Respiratory: no wheezing, no crackles. No accessory muscle use.  Cardiovascular: S1 & S2 heard, regular rate and rhythm. No JVD. Abdomen: Soft, tender in right upper and lower quadrants. High-pitched bowel sounds.  Musculoskeletal:  no clubbing / cyanosis. No joint deformity upper and lower extremities.   Skin: no significant rashes, lesions, ulcers. Warm, dry, well-perfused. Neurologic: CN 2-12 grossly intact. Moving all extremities. Alert and oriented.  Psychiatric: Calm. Cooperative.    Labs and Imaging on Admission: I have personally reviewed following labs and imaging studies  CBC: Recent Labs  Lab 07/06/24 0741 07/08/24 1608 07/08/24 1719  WBC 12.4* 12.4*  --   NEUTROABS 10.1* 8.1*  --   HGB 10.8* 9.5* 11.2*  HCT 35.5* 31.3* 33.0*  MCV 84.5 83.7  --   PLT 249 363  --    Basic Metabolic Panel: Recent Labs  Lab 07/06/24 0741 07/08/24 1608 07/08/24 1719  NA 137 137 137  K 3.4* 3.0* 3.0*  CL 106 102 104  CO2 21* 16*  --   GLUCOSE 121* 197* 202*  BUN 16 53* 49*  CREATININE 1.05 1.70* 1.70*  CALCIUM  8.8* 8.7*  --   MG  --  2.0  --    GFR: Estimated Creatinine Clearance: 60 mL/min (A) (by C-G formula based on SCr of 1.7 mg/dL (H)). Liver Function Tests: Recent Labs  Lab 07/06/24 0741 07/08/24 1608  AST 26 38  ALT 8 6  ALKPHOS 79 74  BILITOT 1.4* 1.0  PROT 6.7 6.9  ALBUMIN 3.5 3.2*   Recent Labs  Lab 07/06/24 0741 07/08/24 1608  LIPASE 20 16   No results for input(s): AMMONIA in the last 168 hours. Coagulation Profile: Recent Labs  Lab 07/08/24 1608  INR 1.8*   Cardiac Enzymes: No results for input(s): CKTOTAL, CKMB, CKMBINDEX, TROPONINI in the last 168 hours. BNP (last 3 results) No results for input(s): PROBNP in the last 8760 hours. HbA1C: No results for input(s): HGBA1C in the last 72 hours. CBG: Recent Labs  Lab 07/08/24 1702  GLUCAP 223*   Lipid Profile: No results for input(s): CHOL, HDL, LDLCALC, TRIG, CHOLHDL, LDLDIRECT in the last 72 hours. Thyroid Function Tests: No results for input(s): TSH, T4TOTAL, FREET4, T3FREE, THYROIDAB in the last 72 hours. Anemia Panel: No results for input(s): VITAMINB12, FOLATE,  FERRITIN, TIBC, IRON, RETICCTPCT in the last 72 hours. Urine analysis:    Component Value Date/Time   COLORURINE AMBER (A) 07/06/2024 1006   APPEARANCEUR CLEAR 07/06/2024 1006   LABSPEC 1.025 07/06/2024 1006   LABSPEC 1.010 02/10/2024 1448   PHURINE 6.0 07/06/2024 1006   GLUCOSEU NEGATIVE 07/06/2024 1006   HGBUR NEGATIVE 07/06/2024 1006   BILIRUBINUR NEGATIVE 07/06/2024 1006   BILIRUBINUR negative 02/10/2024 1448   KETONESUR 5 (A) 07/06/2024 1006   PROTEINUR 30 (A) 07/06/2024 1006   NITRITE NEGATIVE 07/06/2024 1006   LEUKOCYTESUR NEGATIVE 07/06/2024 1006   Sepsis Labs: @LABRCNTIP (procalcitonin:4,lacticidven:4) )No results found for this or any previous visit (from the past 240 hours).   Radiological Exams on Admission: CT Angio Abd/Pel W and/or Wo Contrast Result Date: 07/08/2024 CLINICAL DATA:  Lower gastrointestinal bleeding, dark stool since this morning, nausea EXAM: CTA ABDOMEN AND PELVIS WITHOUT AND WITH CONTRAST TECHNIQUE: Multidetector CT imaging of the abdomen and pelvis was performed using the standard protocol during bolus administration of intravenous contrast. Multiplanar reconstructed images and MIPs were obtained and reviewed to evaluate the vascular anatomy. RADIATION DOSE REDUCTION: This exam was performed according to the departmental dose-optimization program which includes automated exposure control, adjustment of the mA and/or kV according to patient size and/or use of iterative reconstruction technique. CONTRAST:  80mL OMNIPAQUE  IOHEXOL  350 MG/ML SOLN COMPARISON:  03/10/2024 FINDINGS: VASCULAR Aorta: Normal caliber aorta without aneurysm, dissection, vasculitis or significant stenosis. Celiac: Patent without evidence of aneurysm, dissection, vasculitis or significant stenosis. SMA: Patent without evidence of aneurysm, dissection, vasculitis or significant stenosis. Renals: Both renal arteries are patent without evidence of aneurysm, dissection, vasculitis,  fibromuscular dysplasia or significant stenosis. IMA: Patent without evidence of aneurysm, dissection, vasculitis or significant stenosis. Inflow: Patent without evidence of aneurysm, dissection, vasculitis or significant stenosis. Proximal Outflow: Bilateral common femoral and visualized portions of the superficial and profunda femoral arteries are patent without evidence of aneurysm, dissection, vasculitis or significant stenosis. Veins: No acute venous abnormalities. Review of the MIP images confirms the above findings. NON-VASCULAR Lower chest: No acute pleural or parenchymal lung disease. Fluid-filled distal thoracic esophagus may reflect sequela of reflux. Small pericardial effusion. Hepatobiliary: Simple right lobe liver cyst is not require follow-up. Remainder of the liver is unremarkable. The gallbladder is moderately distended, with gallbladder wall thickening and pericholecystic fluid concerning for acute cholecystitis. No calcified gallstones. No biliary  duct dilation. Pancreas: Unremarkable. No pancreatic ductal dilatation or surrounding inflammatory changes. Spleen: Normal in size without focal abnormality. Adrenals/Urinary Tract: Adrenal glands are unremarkable. Kidneys are normal, without renal calculi, focal lesion, or hydronephrosis. Bladder is unremarkable. Stomach/Bowel: There is marked distension of the stomach and small bowel, with jejunum measuring up to 5.5 cm in diameter. Transition from dilated to nondilated small bowel within the lower pelvis at the level of the distal jejunum/ileum. Findings are consistent with small-bowel obstruction. Scattered gas fluid levels within the: Compatible with diarrhea or recent laxative/enema use. There is no intraluminal accumulation of contrast to suggest active gastrointestinal bleeding. Lymphatic: No pathologic adenopathy. Reproductive: Prostate is unremarkable. Other: No free fluid or free intraperitoneal gas. No abdominal wall hernia. Musculoskeletal:  No acute or destructive bony abnormalities. Reconstructed images demonstrate no additional findings. IMPRESSION: VASCULAR 1. No evidence of active gastrointestinal bleeding. 2. No significant vascular abnormalities. NON-VASCULAR 1. Small-bowel obstruction, with transition from dilated to nondilated bowel within the distal jejunum/ileum within the lower pelvis. 2. Gallbladder wall thickening and pericholecystic fat stranding concerning for acute cholecystitis. No calcified gallstones. 3. Fluid-filled distal thoracic esophagus which may reflect sequela of reflux. 4. Scattered gas fluid levels within the colon, consistent with diarrhea or recent laxative/enema use. 5. Small pericardial effusion. Electronically Signed   By: Ozell Daring M.D.   On: 07/08/2024 18:02   CT Head Wo Contrast Result Date: 07/08/2024 EXAM: CT HEAD WITHOUT CONTRAST 07/08/2024 05:51:00 PM TECHNIQUE: CT of the head was performed without the administration of intravenous contrast. Automated exposure control, iterative reconstruction, and/or weight based adjustment of the mA/kV was utilized to reduce the radiation dose to as low as reasonably achievable. COMPARISON: 07/06/2024 CLINICAL HISTORY: Fall on blood thinners. Possible GI bleed. FINDINGS: BRAIN AND VENTRICLES: No acute hemorrhage. No evidence of acute infarct. No hydrocephalus. No extra-axial collection. No mass effect or midline shift. Chronic microvascular ischemia and generalized atrophy. ORBITS: No acute abnormality. SINUSES: No acute abnormality. SOFT TISSUES AND SKULL: No acute soft tissue abnormality. No skull fracture. IMPRESSION: 1. No acute intracranial abnormality. Electronically signed by: Norman Gatlin MD 07/08/2024 05:57 PM EDT RP Workstation: [id number]    EKG: Independently reviewed. Sinus rhythm, PVC.   Assessment/Plan   1. SBO; ?acute cholelithiasis  - Appreciate surgery assessment and recommendations  - Continue bowel-rest and IVF hydration, hold Eliquis ,  start NGT decompression, may need HIDA scan, start empiric antibiotic for now   2. GI bleeding  - Dark stool and emesis, FOBT+, initial Hgb 9.5, on Eliquis  for PE in June 2025  - Hold Eliquis , type & screen, trend CBC, continue IV PPI    3. Syncope  - He had syncope upon standing from bedside commode in ED  - He has hx of orthostatic hypotension, is hypovolemic in setting of N/V/D and GIB, and there is low suspicion for cardiac etiology  - Continue IVF hydration, transfuse RBC as needed, use fall precautions   4. AKI  - SCr is 1.70 on admission, up from 1.05 two days earlier  - No hydronephrosis on CT in ED  - Prerenal in setting of N/V/D and GIB  - Continue IVF hydration, repeat chem panel in am    5. Hx of PE in June 2025  - Hold anticoagulation while trending H&H    6. Parkinson disease; mild cognitive impairment  - Use delirium precautions, resume Sinemet  as his condition allows   7. Hypokalemia  - Replacing, will repeat chem panel in am   8. Pericardial effusion  -  Small pericardial effusion noted on CT in ED  - Check echocardiogram    DVT prophylaxis: SCDs  Code Status: Full  Level of Care: Level of care: Progressive Family Communication: None present  Disposition Plan:  Patient is from: SNF  Anticipated d/c is to: SNF  Anticipated d/c date is: 07/11/24  Patient currently: Pending return of bowel function, stable H&H, clinical course  Consults called: Surgery  Admission status: inpatient     Evalene GORMAN Sprinkles, MD [facility]  07/08/2024, 8:32 PM

## 2024-07-08 NOTE — ED Notes (Signed)
 Pt using BSC and had an unresponsive episode, pt assisted back into the bed with multiple staff in the room, EDP at bedside

## 2024-07-08 NOTE — ED Triage Notes (Signed)
 Pt from [place] with ems c.o dark tarry loose stool since this morning and 1 episode of dark emesis. Positive hemoccult at facility. On eliquis . Pt c.o abd pain yesterday but none today. Pt c.o nausea, pt given zofran  at facility PTA and 2 antidiarrheal today.

## 2024-07-08 NOTE — Consult Note (Signed)
 Reason for Consult:SBO Referring Physician: Kasyn Rolph is an 63 y.o. male.  HPI: 63yo M with PMHx as below brought from a facility with C/O dark emesis and dark tarry stools. He is on eliquis  for PE. He says the N/V/D started yesterday. Also C/O some scattered abdominal pain. CT A/P shows SBO and a thickened GB with some stranding. Patient reports he needs to have another BM now.   Past Medical History:  Diagnosis Date   Acute metabolic encephalopathy 03/24/2024   Acute pulmonary embolism 03/10/2024   AKI (acute kidney injury) 03/16/2024   ARF (acute renal failure) 03/10/2024   Bilateral pulmonary embolism 03/16/2024   BPH with urinary obstruction 03/24/2024   Cervical myelopathy    Essential hypertension 10/29/2021   Family history of prostate cancer 10/29/2021   Frequent falls 03/24/2024   Gastroesophageal reflux disease without esophagitis 10/29/2021   Hypokalemia 03/10/2024   Hypomagnesemia 03/24/2024   Hyponatremia 03/10/2024   Impairment of balance 03/12/2022   Laceration of head 03/24/2024   Lactic acidosis 03/16/2024   Lateral epicondylitis of right elbow 03/10/2024   Lymphedema 03/12/2022   Maxillary fracture 03/10/2024   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 Consider a repeat head CT to assess for any acute hemorrhage within the hematomas. Balanced mass effect upon the underlying cerebral hemispheres without midline shift   Tinnitus of  both ears 12/10/2023   Urinary tract infection 03/24/2024    Past Surgical History:  Procedure Laterality Date   RADIOLOGY WITH ANESTHESIA N/A 03/29/2024   Procedure: RADIOLOGY WITH ANESTHESIA;  Surgeon: Radiologist, Medication, MD;  Location: [facility] OR;  Service: Radiology;  Laterality: N/A;    Family History  Problem Relation Age of Onset   Cancer Father     Social History:  reports that he quit smoking about 25 years ago. His smoking use included cigars. He has never used smokeless tobacco. He reports that he does not currently use alcohol. He reports that he does not use drugs.  Allergies:  Allergies  Allergen Reactions   Flomax  [Tamsulosin  Hcl] Anxiety and Other (See Comments)    Jittery     Medications: I have reviewed the patient's current medications.  Results for orders placed or performed during the hospital encounter of 07/08/24 (from the past 48 hours)  POC occult blood, ED     Status: Abnormal   Collection Time: 07/08/24  4:06 PM  Result Value Ref Range   Fecal Occult Bld POSITIVE (A) NEGATIVE  Comprehensive metabolic panel     Status: Abnormal   Collection Time: 07/08/24  4:08 PM  Result Value Ref Range   Sodium 137 135 - 145 mmol/L   Potassium 3.0 (L) 3.5 - 5.1 mmol/L   Chloride 102 98 - 111 mmol/L   CO2 16 (L) 22 - 32 mmol/L   Glucose, Bld 197 (H) 70 - 99 mg/dL    Comment: Glucose reference range applies only to samples taken after fasting for at least 8  hours.   BUN 53 (H) 8 - 23 mg/dL   Creatinine, Ser 8.29 (H) 0.61 - 1.24 mg/dL   Calcium  8.7 (L) 8.9 - 10.3 mg/dL   Total Protein 6.9 6.5 - 8.1 g/dL   Albumin 3.2 (L) 3.5 - 5.0 g/dL   AST 38 15 - 41 U/L   ALT 6 0 - 44 U/L   Alkaline Phosphatase 74 38 - 126 U/L   Total Bilirubin 1.0 0.0 - 1.2 mg/dL   GFR, Estimated 45 (L) >60 mL/min    Comment: (NOTE) Calculated using the CKD-EPI Creatinine Equation (2021)    Anion gap 19 (H) 5 - 15    Comment: Performed at [facility], [address]  [place], [place] [address]  CBC with Differential     Status: Abnormal   Collection Time: 07/08/24  4:08 PM  Result Value Ref Range   WBC 12.4 (H) 4.0 - 10.5 K/uL   RBC 3.74 (L) 4.22 - 5.81 MIL/uL   Hemoglobin 9.5 (L) 13.0 - 17.0 g/dL   HCT 68.6 (L) 60.9 - 47.9 %   MCV 83.7 80.0 - 100.0 fL   MCH 25.4 (L) 26.0 - 34.0 pg   MCHC 30.4 30.0 - 36.0 g/dL   RDW 83.5 (H) 88.4 - 84.4 %   Platelets 363 150 - 400 K/uL   nRBC 0.0 0.0 - 0.2 %   Neutrophils Relative % 65 %   Neutro Abs 8.1 (H) 1.7 - 7.7 K/uL   Lymphocytes Relative 22 %   Lymphs Abs 2.7 0.7 - 4.0 K/uL   Monocytes Relative 12 %   Monocytes Absolute 1.4 (H) 0.1 - 1.0 K/uL   Eosinophils Relative 1 %   Eosinophils Absolute 0.1 0.0 - 0.5 K/uL   Basophils Relative 0 %   Basophils Absolute 0.0 0.0 - 0.1 K/uL   Immature Granulocytes 0 %   Abs Immature Granulocytes 0.04 0.00 - 0.07 K/uL    Comment: Performed at [facility], [address], [place], [place] [address]  Protime-INR     Status: Abnormal   Collection Time: 07/08/24  4:08 PM  Result Value Ref Range   Prothrombin Time 21.6 (H) 11.4 - 15.2 seconds   INR 1.8 (H) 0.8 - 1.2    Comment: (NOTE) INR goal varies based on device and disease states. Performed at [facility], [address] [place], [place] [address]   Lipase, blood     Status: None   Collection Time: 07/08/24  4:08 PM  Result Value Ref Range   Lipase 16 11 - 51 U/L    Comment: Performed at [facility], [address], [place], [place] [address]  APTT     Status: None   Collection Time: 07/08/24  4:08 PM  Result Value Ref Range   aPTT 33 24 - 36 seconds    Comment: Performed at [facility], [address] [place], [place] [address]  Magnesium      Status: None   Collection Time: 07/08/24  4:08 PM  Result Value Ref Range   Magnesium  2.0 1.7 - 2.4 mg/dL    Comment: Performed at [facility], [address] [place], [place] [address]  Fibrinogen     Status: Abnormal   Collection  Time: 07/08/24  4:23 PM  Result Value Ref Range   Fibrinogen >800 (H) 210 - 475 mg/dL    Comment: (NOTE) Fibrinogen results may be underestimated in patients receiving thrombolytic  therapy. Performed at [facility], [address], [place], [place] [address]   CBG monitoring, ED     Status: Abnormal   Collection Time: 07/08/24  5:02 PM  Result Value Ref Range   Glucose-Capillary 223 (H) 70 - 99 mg/dL    Comment: Glucose reference range applies only to samples taken after fasting for at least 8 hours.  Type and screen [facility]     Status: None   Collection Time: 07/08/24  5:03 PM  Result Value Ref Range   ABO/RH(D) B POS    Antibody Screen NEG    Sample Expiration      07/11/2024,2359 Performed at [facility], [address], [place], [place] [address]   I-stat chem 8, ED (not at [facility], [facility] or [facility])     Status: Abnormal   Collection Time: 07/08/24  5:19 PM  Result Value Ref Range   Sodium 137 135 - 145 mmol/L   Potassium 3.0 (L) 3.5 - 5.1 mmol/L   Chloride 104 98 - 111 mmol/L   BUN 49 (H) 8 - 23 mg/dL   Creatinine, Ser 8.29 (H) 0.61 - 1.24 mg/dL   Glucose, Bld 797 (H) 70 - 99 mg/dL    Comment: Glucose reference range applies only to samples taken after fasting for at least 8 hours.   Calcium , Ion 1.07 (L) 1.15 - 1.40 mmol/L   TCO2 17 (L) 22 - 32 mmol/L   Hemoglobin 11.2 (L) 13.0 - 17.0 g/dL   HCT 66.9 (L) 60.9 - 47.9 %  ABO/Rh     Status: None   Collection Time: 07/08/24  5:19 PM  Result Value Ref Range   ABO/RH(D)      B POS Performed at [facility], [address], [place], [place] [address]   I-Stat CG4 Lactic Acid     Status: Abnormal   Collection Time: 07/08/24  5:20 PM  Result Value Ref Range   Lactic Acid, Venous 6.1 (HH) 0.5 - 1.9 mmol/L   Comment NOTIFIED PHYSICIAN   I-Stat CG4 Lactic Acid     Status: None   Collection Time: 07/08/24  7:26 PM  Result Value Ref Range   Lactic Acid, Venous 1.3 0.5 - 1.9 mmol/L    CT  Angio Abd/Pel W and/or Wo Contrast Result Date: 07/08/2024 CLINICAL DATA:  Lower gastrointestinal bleeding, dark stool since this morning, nausea EXAM: CTA ABDOMEN AND PELVIS WITHOUT AND WITH CONTRAST TECHNIQUE: Multidetector CT imaging of the abdomen and pelvis was performed using the standard protocol during bolus administration of intravenous contrast. Multiplanar reconstructed images and MIPs were obtained and reviewed to evaluate the vascular anatomy. RADIATION DOSE REDUCTION: This exam was performed according to the departmental dose-optimization program which includes automated exposure control, adjustment of the mA and/or kV according to patient size and/or use of iterative reconstruction technique. CONTRAST:  80mL OMNIPAQUE  IOHEXOL  350 MG/ML SOLN COMPARISON:  03/10/2024 FINDINGS: VASCULAR Aorta: Normal caliber aorta without aneurysm, dissection, vasculitis or significant stenosis. Celiac: Patent without evidence of aneurysm, dissection, vasculitis or significant stenosis. SMA: Patent without evidence of aneurysm, dissection, vasculitis or significant stenosis. Renals: Both renal arteries are patent without evidence of aneurysm, dissection, vasculitis, fibromuscular dysplasia or significant stenosis. IMA: Patent without evidence of aneurysm, dissection, vasculitis or significant stenosis. Inflow: Patent without evidence of aneurysm, dissection, vasculitis or significant stenosis. Proximal Outflow: Bilateral common femoral and visualized portions of the superficial and profunda femoral arteries are patent without evidence of aneurysm, dissection,  vasculitis or significant stenosis. Veins: No acute venous abnormalities. Review of the MIP images confirms the above findings. NON-VASCULAR Lower chest: No acute pleural or parenchymal lung disease. Fluid-filled distal thoracic esophagus may reflect sequela of reflux. Small pericardial effusion. Hepatobiliary: Simple right lobe liver cyst is not require follow-up.  Remainder of the liver is unremarkable. The gallbladder is moderately distended, with gallbladder wall thickening and pericholecystic fluid concerning for acute cholecystitis. No calcified gallstones. No biliary duct dilation. Pancreas: Unremarkable. No pancreatic ductal dilatation or surrounding inflammatory changes. Spleen: Normal in size without focal abnormality. Adrenals/Urinary Tract: Adrenal glands are unremarkable. Kidneys are normal, without renal calculi, focal lesion, or hydronephrosis. Bladder is unremarkable. Stomach/Bowel: There is marked distension of the stomach and small bowel, with jejunum measuring up to 5.5 cm in diameter. Transition from dilated to nondilated small bowel within the lower pelvis at the level of the distal jejunum/ileum. Findings are consistent with small-bowel obstruction. Scattered gas fluid levels within the: Compatible with diarrhea or recent laxative/enema use. There is no intraluminal accumulation of contrast to suggest active gastrointestinal bleeding. Lymphatic: No pathologic adenopathy. Reproductive: Prostate is unremarkable. Other: No free fluid or free intraperitoneal gas. No abdominal wall hernia. Musculoskeletal: No acute or destructive bony abnormalities. Reconstructed images demonstrate no additional findings. IMPRESSION: VASCULAR 1. No evidence of active gastrointestinal bleeding. 2. No significant vascular abnormalities. NON-VASCULAR 1. Small-bowel obstruction, with transition from dilated to nondilated bowel within the distal jejunum/ileum within the lower pelvis. 2. Gallbladder wall thickening and pericholecystic fat stranding concerning for acute cholecystitis. No calcified gallstones. 3. Fluid-filled distal thoracic esophagus which may reflect sequela of reflux. 4. Scattered gas fluid levels within the colon, consistent with diarrhea or recent laxative/enema use. 5. Small pericardial effusion. Electronically Signed   By: Ozell Daring M.D.   On: 07/08/2024  18:02   CT Head Wo Contrast Result Date: 07/08/2024 EXAM: CT HEAD WITHOUT CONTRAST 07/08/2024 05:51:00 PM TECHNIQUE: CT of the head was performed without the administration of intravenous contrast. Automated exposure control, iterative reconstruction, and/or weight based adjustment of the mA/kV was utilized to reduce the radiation dose to as low as reasonably achievable. COMPARISON: 07/06/2024 CLINICAL HISTORY: Fall on blood thinners. Possible GI bleed. FINDINGS: BRAIN AND VENTRICLES: No acute hemorrhage. No evidence of acute infarct. No hydrocephalus. No extra-axial collection. No mass effect or midline shift. Chronic microvascular ischemia and generalized atrophy. ORBITS: No acute abnormality. SINUSES: No acute abnormality. SOFT TISSUES AND SKULL: No acute soft tissue abnormality. No skull fracture. IMPRESSION: 1. No acute intracranial abnormality. Electronically signed by: Norman Gatlin MD 07/08/2024 05:57 PM EDT RP Workstation: [id number]    Review of Systems  Constitutional:  Positive for appetite change.  HENT: Negative.    Eyes: Negative.   Respiratory: Negative.    Cardiovascular: Negative.   Gastrointestinal:  Positive for abdominal pain, diarrhea, nausea and vomiting. Negative for constipation.  Endocrine: Negative.   Genitourinary: Negative.   Musculoskeletal: Negative.   Skin: Negative.   Allergic/Immunologic: Negative.   Neurological: Negative.   Hematological: Negative.   Psychiatric/Behavioral: Negative.     Blood pressure 120/84, pulse (!) 101, temperature 98.1 F (36.7 C), temperature source Oral, resp. rate 17, SpO2 97%. Physical Exam HENT:     Nose: Nose normal.     Mouth/Throat:     Mouth: Mucous membranes are moist.  Cardiovascular:     Rate and Rhythm: Normal rate and regular rhythm.  Pulmonary:     Effort: Pulmonary effort is normal.     Breath sounds: Normal  breath sounds. No wheezing.  Abdominal:     Palpations: Abdomen is soft.     Tenderness: There is  abdominal tenderness. There is no guarding or rebound.     Comments: Mild tenderness RLQ, no sig RUQ tenderness  Musculoskeletal:        General: No tenderness.  Skin:    General: Skin is warm.  Neurological:     Mental Status: He is alert.     Comments: Answers questions, F/C     Assessment/Plan: SBO - normal lactate and exam is not concerning. Agree with [facility] admit. Recommend NGT decompression. Likely will do SBO protocol in AM. Hold Eliquis .  ?GB inflammation - not significantly tender RUQ, will follow exam. May consider HIDA  We will follow  Dann FORBES Hummer 07/08/2024, 8:21 PM

## 2024-07-08 NOTE — ED Notes (Signed)
 Patient transported to CT with nurse while connected to monitor.

## 2024-07-08 NOTE — ED Notes (Addendum)
 Pt assisted to bedside commode by Arnaldo RN and Devin NT. Pt denies any dizziness upon standing. Returned to pt room. Pt attempting to stand up from bedside commode. Liquid stool in floor. Sabina Beavers RN and Devin NT at bedside to help pt back into bed. Pt became pale and diaphoretic, had a syncopal episode and fell into floor. Fall witnessed by Arnaldo RN, Devin NT, and Nanavati MD. Pt minimally responsive but breathing adequately. Multiple staff in room to assist pt back in bed. IV access established. Pt more alert once in bed and able to answer questions appropriately, pt requesting diet coke. Initial blood pressure once back in the bed hypotensive. Fluid bolus hung with pressure bag. Color and alertness improved further with fluids.

## 2024-07-08 NOTE — ED Provider Notes (Signed)
 [place] EMERGENCY DEPARTMENT AT [facility] Provider Note   CSN: [id number] Arrival date & time: 07/08/24  1525     Patient presents with: No chief complaint on file.   John [name] is a 63 y.o. 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  Patient says he was told he is having dark loose stools since this morning and had 1 episode of small-volume dark emesis today.  He complained of abdominal pain yesterday however denies significant pain today.  He does endorse nausea.  Patient denies history of liver disease and no documented history of cirrhosis.   HPI     Prior to Admission medications   Medication Sig Start Date End Date Taking? Authorizing Provider  acetaminophen  (TYLENOL ) 500 MG tablet Take 1 tablet (500 mg total) by mouth every 6 (six) hours as needed for mild pain (pain score 1-3), moderate pain (pain score 4-6) or headache. 03/16/24  Yes Hongalgi, Anand D, MD  apixaban  (ELIQUIS ) 5 MG TABS tablet Take 2 tabs (10 mg total) twice a day through 03/18/2024.  From 03/19/2024, start taking 1 tab (5 Mg total) twice daily. 03/16/24  Yes Hongalgi, Anand D, MD  carbidopa -levodopa  (SINEMET  CR) 50-200 MG tablet Take 1 tablet by mouth 6 (six) times daily. (4-5 AM, 8 AM, 11 AM, 2 PM, 5 PM, 8 PM). 12/08/23  Yes Jaffe, Adam R, DO  cyanocobalamin  1000 MCG tablet Take 1 tablet (1,000 mcg total) by mouth daily. 03/23/24  Yes Rashid, Farhan, MD  HYDROcodone -acetaminophen  (NORCO/VICODIN) 5-325 MG tablet Take 1 tablet by mouth every 8 (eight) hours as needed for severe pain (pain score 7-10). 04/16/24  Yes Dennise Lavada MARLA, MD  midodrine  (PROAMATINE ) 5 MG tablet Take 1 tablet (5 mg total) by mouth 2 (two) times daily with a meal. 04/16/24  Yes Singh, Prashant K, MD  Multiple Vitamin (MULTIVITAMIN WITH MINERALS) TABS tablet Take 1 tablet by mouth daily. 03/23/24  Yes Rashid, Farhan, MD  omeprazole  (PRILOSEC  OTC) 20  MG tablet Take 20 mg by mouth daily.   Yes [provider]  pyridOXINE  (B-6) 50 MG tablet Take 1 tablet (50 mg total) by mouth daily. 04/17/24  Yes Dennise Lavada MARLA, MD  thiamine  (VITAMIN B-1) 100 MG tablet Take 1 tablet (100 mg total) by mouth daily. 04/17/24  Yes Dennise Lavada MARLA, MD    Allergies: Flomax  [tamsulosin  hcl]    Review of Systems  Constitutional:  Negative for chills and fever.  Gastrointestinal:  Positive for abdominal pain (Epigastric), blood in stool (Melanotic stool), diarrhea and nausea.  All other systems reviewed and are negative.   Updated Vital Signs BP 139/85 (BP Location: Right Arm)   Pulse (!) 128   Temp 98.2 F (36.8 C) (Axillary)   Resp 18   Ht 6' 2 (1.88 m)   Wt 103.1 kg   SpO2 100%   BMI 29.18 kg/m   Physical Exam Vitals and nursing note reviewed. Exam conducted with a chaperone present.  Constitutional:      General: He is not in acute distress.    Appearance: He is well-developed.  HENT:     Head: Normocephalic and atraumatic.     Mouth/Throat:     Mouth: Mucous membranes are moist.     Comments: Dried dark emesis around oropharynx Cardiovascular:     Rate and Rhythm: Regular rhythm. Tachycardia present.     Heart sounds: No murmur heard. Pulmonary:  Effort: Pulmonary effort is normal. No respiratory distress.     Breath sounds: Normal breath sounds.  Abdominal:     Palpations: Abdomen is soft.     Tenderness: There is abdominal tenderness (Epigastric). There is rebound. There is no guarding.  Skin:    General: Skin is warm and dry.     Capillary Refill: Capillary refill takes less than 2 seconds.  Neurological:     Mental Status: He is alert and oriented to person, place, and time.     (all labs ordered are listed, but only abnormal results are displayed) Labs Reviewed  COMPREHENSIVE METABOLIC PANEL WITH GFR - Abnormal; Notable for the following components:      Result Value   Potassium 3.0 (*)    CO2 16 (*)     Glucose, Bld 197 (*)    BUN 53 (*)    Creatinine, Ser 1.70 (*)    Calcium  8.7 (*)    Albumin 3.2 (*)    GFR, Estimated 45 (*)    Anion gap 19 (*)    All other components within normal limits  CBC WITH DIFFERENTIAL/PLATELET - Abnormal; Notable for the following components:   WBC 12.4 (*)    RBC 3.74 (*)    Hemoglobin 9.5 (*)    HCT 31.3 (*)    MCH 25.4 (*)    RDW 16.4 (*)    Neutro Abs 8.1 (*)    Monocytes Absolute 1.4 (*)    All other components within normal limits  PROTIME-INR - Abnormal; Notable for the following components:   Prothrombin Time 21.6 (*)    INR 1.8 (*)    All other components within normal limits  FIBRINOGEN - Abnormal; Notable for the following components:   Fibrinogen >800 (*)    All other components within normal limits  BASIC METABOLIC PANEL WITH GFR - Abnormal; Notable for the following components:   Potassium 2.9 (*)    CO2 17 (*)    Glucose, Bld 135 (*)    BUN 63 (*)    Creatinine, Ser 1.39 (*)    Calcium  7.8 (*)    GFR, Estimated 57 (*)    All other components within normal limits  CBC - Abnormal; Notable for the following components:   RBC 3.41 (*)    Hemoglobin 8.6 (*)    HCT 27.9 (*)    MCH 25.2 (*)    RDW 16.2 (*)    All other components within normal limits  CBC - Abnormal; Notable for the following components:   RBC 3.74 (*)    Hemoglobin 9.6 (*)    HCT 30.8 (*)    MCH 25.7 (*)    RDW 16.1 (*)    All other components within normal limits  CBC - Abnormal; Notable for the following components:   RBC 3.12 (*)    Hemoglobin 8.0 (*)    HCT 25.4 (*)    MCH 25.6 (*)    RDW 16.3 (*)    All other components within normal limits  POC OCCULT BLOOD, ED - Abnormal; Notable for the following components:   Fecal Occult Bld POSITIVE (*)    All other components within normal limits  I-STAT CHEM 8, ED - Abnormal; Notable for the following components:   Potassium 3.0 (*)    BUN 49 (*)    Creatinine, Ser 1.70 (*)    Glucose, Bld 202 (*)     Calcium , Ion 1.07 (*)    TCO2 17 (*)  Hemoglobin 11.2 (*)    HCT 33.0 (*)    All other components within normal limits  I-STAT CG4 LACTIC ACID, ED - Abnormal; Notable for the following components:   Lactic Acid, Venous 6.1 (*)    All other components within normal limits  CBG MONITORING, ED - Abnormal; Notable for the following components:   Glucose-Capillary 223 (*)    All other components within normal limits  LIPASE, BLOOD  APTT  MAGNESIUM   CBC  CBC  BASIC METABOLIC PANEL WITH GFR  VITAMIN B12  FOLATE  IRON AND TIBC  FERRITIN  RETICULOCYTES  I-STAT CG4 LACTIC ACID, ED  TYPE AND SCREEN  ABO/RH    EKG: EKG Interpretation Date/Time:  Thursday July 08 2024 15:44:00 EDT Ventricular Rate:  93 PR Interval:  151 QRS Duration:  104 QT Interval:  390 QTC Calculation: 486 R Axis:   0  Text Interpretation: Sinus rhythm Ventricular premature complex Low voltage, precordial leads Nonspecific repol abnormality, lateral leads Confirmed by Charlyn Sora ([id number]) on 07/08/2024 4:36:11 PM  Radiology: [facility] Abdomen 1 View Result Date: 07/09/2024 EXAM: 1 VIEW XRAY OF THE ABDOMEN 07/09/2024 05:08:37 AM COMPARISON: None available. CLINICAL HISTORY: Evaluate enteric tube placement. FINDINGS: LINES, TUBES AND DEVICES: Enteric tube in place with tip and side port projecting over the distal stomach. BOWEL: Partially imaged upper abdomen shows gaseous dilation of small bowel loops. SOFT TISSUES: No opaque urinary calculi. BONES: No acute osseous abnormality. IMPRESSION: 1. Enteric tube is in place with tip and side port over the distal stomach. 2. Gaseous dilation of small bowel loops in the partially imaged upper abdomen. Electronically signed by: Waddell Calk MD 07/09/2024 06:31 AM EDT RP Workstation: [id number]   CT Angio Abd/Pel W and/or Wo Contrast Result Date: 07/08/2024 CLINICAL DATA:  Lower gastrointestinal bleeding, dark stool since this morning, nausea EXAM: CTA ABDOMEN AND PELVIS  WITHOUT AND WITH CONTRAST TECHNIQUE: Multidetector CT imaging of the abdomen and pelvis was performed using the standard protocol during bolus administration of intravenous contrast. Multiplanar reconstructed images and MIPs were obtained and reviewed to evaluate the vascular anatomy. RADIATION DOSE REDUCTION: This exam was performed according to the departmental dose-optimization program which includes automated exposure control, adjustment of the mA and/or kV according to patient size and/or use of iterative reconstruction technique. CONTRAST:  80mL OMNIPAQUE  IOHEXOL  350 MG/ML SOLN COMPARISON:  03/10/2024 FINDINGS: VASCULAR Aorta: Normal caliber aorta without aneurysm, dissection, vasculitis or significant stenosis. Celiac: Patent without evidence of aneurysm, dissection, vasculitis or significant stenosis. SMA: Patent without evidence of aneurysm, dissection, vasculitis or significant stenosis. Renals: Both renal arteries are patent without evidence of aneurysm, dissection, vasculitis, fibromuscular dysplasia or significant stenosis. IMA: Patent without evidence of aneurysm, dissection, vasculitis or significant stenosis. Inflow: Patent without evidence of aneurysm, dissection, vasculitis or significant stenosis. Proximal Outflow: Bilateral common femoral and visualized portions of the superficial and profunda femoral arteries are patent without evidence of aneurysm, dissection, vasculitis or significant stenosis. Veins: No acute venous abnormalities. Review of the MIP images confirms the above findings. NON-VASCULAR Lower chest: No acute pleural or parenchymal lung disease. Fluid-filled distal thoracic esophagus may reflect sequela of reflux. Small pericardial effusion. Hepatobiliary: Simple right lobe liver cyst is not require follow-up. Remainder of the liver is unremarkable. The gallbladder is moderately distended, with gallbladder wall thickening and pericholecystic fluid concerning for acute cholecystitis.  No calcified gallstones. No biliary duct dilation. Pancreas: Unremarkable. No pancreatic ductal dilatation or surrounding inflammatory changes. Spleen: Normal in size without focal abnormality. Adrenals/Urinary Tract: Adrenal  glands are unremarkable. Kidneys are normal, without renal calculi, focal lesion, or hydronephrosis. Bladder is unremarkable. Stomach/Bowel: There is marked distension of the stomach and small bowel, with jejunum measuring up to 5.5 cm in diameter. Transition from dilated to nondilated small bowel within the lower pelvis at the level of the distal jejunum/ileum. Findings are consistent with small-bowel obstruction. Scattered gas fluid levels within the: Compatible with diarrhea or recent laxative/enema use. There is no intraluminal accumulation of contrast to suggest active gastrointestinal bleeding. Lymphatic: No pathologic adenopathy. Reproductive: Prostate is unremarkable. Other: No free fluid or free intraperitoneal gas. No abdominal wall hernia. Musculoskeletal: No acute or destructive bony abnormalities. Reconstructed images demonstrate no additional findings. IMPRESSION: VASCULAR 1. No evidence of active gastrointestinal bleeding. 2. No significant vascular abnormalities. NON-VASCULAR 1. Small-bowel obstruction, with transition from dilated to nondilated bowel within the distal jejunum/ileum within the lower pelvis. 2. Gallbladder wall thickening and pericholecystic fat stranding concerning for acute cholecystitis. No calcified gallstones. 3. Fluid-filled distal thoracic esophagus which may reflect sequela of reflux. 4. Scattered gas fluid levels within the colon, consistent with diarrhea or recent laxative/enema use. 5. Small pericardial effusion. Electronically Signed   By: Ozell Daring M.D.   On: 07/08/2024 18:02   CT Head Wo Contrast Result Date: 07/08/2024 EXAM: CT HEAD WITHOUT CONTRAST 07/08/2024 05:51:00 PM TECHNIQUE: CT of the head was performed without the administration  of intravenous contrast. Automated exposure control, iterative reconstruction, and/or weight based adjustment of the mA/kV was utilized to reduce the radiation dose to as low as reasonably achievable. COMPARISON: 07/06/2024 CLINICAL HISTORY: Fall on blood thinners. Possible GI bleed. FINDINGS: BRAIN AND VENTRICLES: No acute hemorrhage. No evidence of acute infarct. No hydrocephalus. No extra-axial collection. No mass effect or midline shift. Chronic microvascular ischemia and generalized atrophy. ORBITS: No acute abnormality. SINUSES: No acute abnormality. SOFT TISSUES AND SKULL: No acute soft tissue abnormality. No skull fracture. IMPRESSION: 1. No acute intracranial abnormality. Electronically signed by: Norman Gatlin MD 07/08/2024 05:57 PM EDT RP Workstation: [id number]     Procedures   Medications Ordered in the ED  sodium chloride  flush (NS) 0.9 % injection 3 mL (3 mLs Intravenous Given 07/08/24 2113)  0.9 % NaCl with KCl 20 mEq/ L  infusion ( Intravenous New Bag/Given 07/09/24 0640)  acetaminophen  (TYLENOL ) tablet 650 mg (has no administration in time range)    Or  acetaminophen  (TYLENOL ) suppository 650 mg (has no administration in time range)  HYDROmorphone (DILAUDID) injection 0.5 mg (0.5 mg Intravenous Given 07/09/24 0226)  pantoprazole  (PROTONIX ) injection 40 mg (40 mg Intravenous Given 07/09/24 0503)  lactated ringers  bolus 1,000 mL (has no administration in time range)  methocarbamol (ROBAXIN) injection 1,000 mg (has no administration in time range)  prochlorperazine  (COMPAZINE ) injection 5-10 mg (has no administration in time range)  phenol (CHLORASEPTIC) mouth spray 2 spray (has no administration in time range)  menthol (CEPACOL) lozenge 3 mg (has no administration in time range)  magic mouthwash (has no administration in time range)  alum & mag hydroxide-simeth (MAALOX/MYLANTA) 200-200-20 MG/5ML suspension 30 mL (has no administration in time range)  simethicone (MYLICON) 40  MG/0.6ML suspension 80 mg (has no administration in time range)  bisacodyl  (DULCOLAX) suppository 10 mg (has no administration in time range)  naphazoline-glycerin (CLEAR EYES REDNESS) ophth solution 1-2 drop (has no administration in time range)  sodium chloride  (OCEAN) 0.65 % nasal spray 1-2 spray (has no administration in time range)  diatrizoate meglumine-sodium (GASTROGRAFIN) 66-10 % solution 90 mL (has no administration in  time range)  prochlorperazine  (COMPAZINE ) injection 5 mg (5 mg Intravenous Given 07/08/24 1705)  pantoprazole  (PROTONIX ) injection 40 mg (40 mg Intravenous Given 07/08/24 1705)  sodium chloride  0.9 % bolus 1,000 mL (0 mLs Intravenous Stopped 07/08/24 1730)  lactated ringers  bolus 1,000 mL (0 mLs Intravenous Stopped 07/08/24 1820)  iohexol  (OMNIPAQUE ) 350 MG/ML injection 80 mL (80 mLs Intravenous Contrast Given 07/08/24 1753)  potassium chloride  10 mEq in 100 mL IVPB (0 mEq Intravenous Stopped 07/08/24 1920)  piperacillin-tazobactam (ZOSYN) IVPB 3.375 g (0 g Intravenous Stopped 07/08/24 2146)    Clinical Course as of 07/09/24 1034  Thu Jul 08, 2024  1917 Pending surgery  [LB]    Clinical Course User Index [LB] Sharlet Dowdy, MD                                 Medical Decision Making Patient is a 63 year old male anticoagulated on Eliquis  presenting for hematochezia and melena as above.  Patient with known history of GERD on omeprazole , no known history of hepatic dysfunction or cirrhosis.  On initial evaluation patient is hemodynamically stable and at his mental baseline. He has abdominal tenderness on palpation and tachycardia, patient with continued dark emesis.   Differential diagnosis considered but not limited to upper GI bleed, peptic ulcer, varices, SBO, bowel perforation  Patient was given an IV bolus, compazine  and protonix   Patient did stand with the assistance of staff to use the bedside commode, he did have a syncopal event and his SBP as low as 78.  Patient was given an additional IVF bolus. After a short time, patient returned to his baseline mental status and reported feeling dizzy then falling, given his vitals and mental status returned to baseline, most consistent with a vagal event following a bowl movement. CT head was ordered given his fall on blood thinners.  Labs significant for for BUN 53, creatinine 1.70, potassium 3.0, serum bicarbonate 16, anion gap 19, WBC 12,400, hemoglobin 9.5, lactate 6.1, and positive fecal occult blood testing. Repeat lactic improved to 1.3 following IVF.  CTA abd/pelvis without acute GI bleed, evidence of SBO and gallbladder thickening concerning for cholecystitis  Surgery was consulted who recommended admission to hospitalist for medical management and SBO protocol. GI was also contacted for evaluation of patient while admitted. Patient was admitted to the hospitalist in stable condition, please see admitting provider's note for the remainder of his care.     Amount and/or Complexity of Data Reviewed Labs: ordered. Radiology: ordered.  Risk Prescription drug management. Decision regarding hospitalization.        Final diagnoses:  SBO (small bowel obstruction) [facility])    ED Discharge Orders          Ordered    Consult to general surgery       Comments: SBO  Provider:  (Not yet assigned)   07/08/24 1826               Sharlet Dowdy, MD 07/09/24 1034    Charlyn Sora, MD 07/10/24 [phone]

## 2024-07-09 ENCOUNTER — Inpatient Hospital Stay (HOSPITAL_COMMUNITY)

## 2024-07-09 DIAGNOSIS — Z7901 Long term (current) use of anticoagulants: Secondary | ICD-10-CM | POA: Diagnosis not present

## 2024-07-09 DIAGNOSIS — G20B2 Parkinson's disease with dyskinesia, with fluctuations: Secondary | ICD-10-CM | POA: Diagnosis not present

## 2024-07-09 DIAGNOSIS — I3139 Other pericardial effusion (noninflammatory): Secondary | ICD-10-CM | POA: Diagnosis not present

## 2024-07-09 DIAGNOSIS — K920 Hematemesis: Secondary | ICD-10-CM | POA: Diagnosis not present

## 2024-07-09 DIAGNOSIS — K56609 Unspecified intestinal obstruction, unspecified as to partial versus complete obstruction: Secondary | ICD-10-CM

## 2024-07-09 DIAGNOSIS — K219 Gastro-esophageal reflux disease without esophagitis: Secondary | ICD-10-CM | POA: Diagnosis not present

## 2024-07-09 DIAGNOSIS — I2699 Other pulmonary embolism without acute cor pulmonale: Secondary | ICD-10-CM | POA: Diagnosis not present

## 2024-07-09 DIAGNOSIS — R55 Syncope and collapse: Secondary | ICD-10-CM | POA: Diagnosis not present

## 2024-07-09 DIAGNOSIS — Z86711 Personal history of pulmonary embolism: Secondary | ICD-10-CM | POA: Insufficient documentation

## 2024-07-09 DIAGNOSIS — D649 Anemia, unspecified: Secondary | ICD-10-CM | POA: Diagnosis not present

## 2024-07-09 LAB — CBC
HCT: 30.8 % — ABNORMAL LOW (ref 39.0–52.0)
HCT: 25.4 % — ABNORMAL LOW (ref 39.0–52.0)
HCT: 24.9 % — ABNORMAL LOW (ref 39.0–52.0)
HCT: 23.4 % — ABNORMAL LOW (ref 39.0–52.0)
Hemoglobin: 9.6 g/dL — ABNORMAL LOW (ref 13.0–17.0)
Hemoglobin: 8 g/dL — ABNORMAL LOW (ref 13.0–17.0)
Hemoglobin: 7.8 g/dL — ABNORMAL LOW (ref 13.0–17.0)
Hemoglobin: 7.3 g/dL — ABNORMAL LOW (ref 13.0–17.0)
MCH: 25.7 pg — ABNORMAL LOW (ref 26.0–34.0)
MCH: 25.6 pg — ABNORMAL LOW (ref 26.0–34.0)
MCH: 25.7 pg — ABNORMAL LOW (ref 26.0–34.0)
MCH: 25.5 pg — ABNORMAL LOW (ref 26.0–34.0)
MCHC: 31.2 g/dL (ref 30.0–36.0)
MCHC: 31.5 g/dL (ref 30.0–36.0)
MCHC: 31.3 g/dL (ref 30.0–36.0)
MCHC: 31.2 g/dL (ref 30.0–36.0)
MCV: 82.4 fL (ref 80.0–100.0)
MCV: 81.4 fL (ref 80.0–100.0)
MCV: 81.9 fL (ref 80.0–100.0)
MCV: 81.8 fL (ref 80.0–100.0)
Platelets: 272 K/uL (ref 150–400)
Platelets: 306 K/uL (ref 150–400)
Platelets: 338 K/uL (ref 150–400)
Platelets: 310 K/uL (ref 150–400)
RBC: 3.74 MIL/uL — ABNORMAL LOW (ref 4.22–5.81)
RBC: 3.12 MIL/uL — ABNORMAL LOW (ref 4.22–5.81)
RBC: 3.04 MIL/uL — ABNORMAL LOW (ref 4.22–5.81)
RBC: 2.86 MIL/uL — ABNORMAL LOW (ref 4.22–5.81)
RDW: 16.1 % — ABNORMAL HIGH (ref 11.5–15.5)
RDW: 16.3 % — ABNORMAL HIGH (ref 11.5–15.5)
RDW: 16.2 % — ABNORMAL HIGH (ref 11.5–15.5)
RDW: 16.4 % — ABNORMAL HIGH (ref 11.5–15.5)
WBC: 5.7 K/uL (ref 4.0–10.5)
WBC: 8.1 K/uL (ref 4.0–10.5)
WBC: 7.3 K/uL (ref 4.0–10.5)
WBC: 7.6 K/uL (ref 4.0–10.5)
nRBC: 0 % (ref 0.0–0.2)
nRBC: 0 % (ref 0.0–0.2)
nRBC: 0 % (ref 0.0–0.2)
nRBC: 0 % (ref 0.0–0.2)

## 2024-07-09 LAB — ECHOCARDIOGRAM COMPLETE
AR max vel: 3.45 cm2
AV Area VTI: 3.44 cm2
AV Area mean vel: 3.3 cm2
AV Mean grad: 3 mmHg
AV Peak grad: 7.3 mmHg
Ao pk vel: 1.35 m/s
Area-P 1/2: 6.77 cm2
Calc EF: 67.4 %
Est EF: >75
Height: 74 in
MV VTI: 4.07 cm2
S' Lateral: 2.8 cm
Single Plane A2C EF: 67.7 %
Single Plane A4C EF: 69 %
Weight: 3636.71 [oz_av]

## 2024-07-09 LAB — RETICULOCYTES
Immature Retic Fract: 19.8 % — ABNORMAL HIGH (ref 2.3–15.9)
RBC.: 3.06 MIL/uL — ABNORMAL LOW (ref 4.22–5.81)
Retic Count, Absolute: 51.4 K/uL (ref 19.0–186.0)
Retic Ct Pct: 1.7 % (ref 0.4–3.1)

## 2024-07-09 LAB — BASIC METABOLIC PANEL WITH GFR
Anion gap: 15 (ref 5–15)
Anion gap: 9 (ref 5–15)
BUN: 63 mg/dL — ABNORMAL HIGH (ref 8–23)
BUN: 58 mg/dL — ABNORMAL HIGH (ref 8–23)
CO2: 17 mmol/L — ABNORMAL LOW (ref 22–32)
CO2: 20 mmol/L — ABNORMAL LOW (ref 22–32)
Calcium: 7.8 mg/dL — ABNORMAL LOW (ref 8.9–10.3)
Calcium: 8.1 mg/dL — ABNORMAL LOW (ref 8.9–10.3)
Chloride: 108 mmol/L (ref 98–111)
Chloride: 113 mmol/L — ABNORMAL HIGH (ref 98–111)
Creatinine, Ser: 1.39 mg/dL — ABNORMAL HIGH (ref 0.61–1.24)
Creatinine, Ser: 1.38 mg/dL — ABNORMAL HIGH (ref 0.61–1.24)
GFR, Estimated: 57 mL/min — ABNORMAL LOW (ref >60)
GFR, Estimated: 57 mL/min — ABNORMAL LOW (ref >60)
Glucose, Bld: 135 mg/dL — ABNORMAL HIGH (ref 70–99)
Glucose, Bld: 118 mg/dL — ABNORMAL HIGH (ref 70–99)
Potassium: 2.9 mmol/L — ABNORMAL LOW (ref 3.5–5.1)
Potassium: 3 mmol/L — ABNORMAL LOW (ref 3.5–5.1)
Sodium: 140 mmol/L (ref 135–145)
Sodium: 142 mmol/L (ref 135–145)

## 2024-07-09 LAB — FERRITIN: Ferritin: 100 ng/mL (ref 24–336)

## 2024-07-09 LAB — FOLATE: Folate: 13.2 ng/mL (ref >5.9)

## 2024-07-09 LAB — VITAMIN B12: Vitamin B-12: 385 pg/mL (ref 180–914)

## 2024-07-09 LAB — IRON AND TIBC
Iron: <10 ug/dL — ABNORMAL LOW (ref 45–182)
TIBC: 220 ug/dL — ABNORMAL LOW (ref 250–450)

## 2024-07-09 MED ORDER — LACTATED RINGERS IV BOLUS: 1000.0000 mL | Freq: Three times a day (TID) | INTRAVENOUS | Status: AC | PRN

## 2024-07-09 MED ORDER — ALUM & MAG HYDROXIDE-SIMETH 200-200-20 MG/5ML PO SUSP: 30.0000 mL | Freq: Four times a day (QID) | ORAL | Status: DC | PRN

## 2024-07-09 MED ORDER — POTASSIUM CHLORIDE 10 MEQ/100ML IV SOLN
10.0000 meq | INTRAVENOUS | Status: AC
  Administered 2024-07-09 (×3): 10 meq via INTRAVENOUS
  Filled 2024-07-09: qty 100

## 2024-07-09 MED ORDER — PROCHLORPERAZINE EDISYLATE 10 MG/2ML IJ SOLN
5.0000 mg | INTRAMUSCULAR | Status: DC | PRN
  Administered 2024-07-11: 5 mg via INTRAVENOUS
  Filled 2024-07-09: qty 2

## 2024-07-09 MED ORDER — SIMETHICONE 40 MG/0.6ML PO SUSP: 80.0000 mg | Freq: Four times a day (QID) | ORAL | Status: DC | PRN

## 2024-07-09 MED ORDER — MAGIC MOUTHWASH: 15.0000 mL | Freq: Four times a day (QID) | ORAL | Status: DC | PRN

## 2024-07-09 MED ORDER — DIATRIZOATE MEGLUMINE & SODIUM 66-10 % PO SOLN
90.0000 mL | Freq: Once | ORAL | Status: AC
  Administered 2024-07-09: 90 mL via NASOGASTRIC
  Filled 2024-07-09: qty 90

## 2024-07-09 MED ORDER — METHOCARBAMOL 1000 MG/10ML IJ SOLN: 1000.0000 mg | Freq: Four times a day (QID) | INTRAMUSCULAR | Status: DC | PRN

## 2024-07-09 MED ORDER — PHENOL 1.4 % MT LIQD: 2.0000 | OROMUCOSAL | Status: DC | PRN

## 2024-07-09 MED ORDER — BISACODYL 10 MG RE SUPP
10.0000 mg | Freq: Every day | RECTAL | Status: DC
  Administered 2024-07-09 – 2024-07-12 (×4): 10 mg via RECTAL
  Filled 2024-07-09 (×4): qty 1

## 2024-07-09 MED ORDER — NAPHAZOLINE-GLYCERIN 0.012-0.25 % OP SOLN: 1.0000 [drp] | Freq: Four times a day (QID) | OPHTHALMIC | Status: DC | PRN

## 2024-07-09 MED ORDER — SALINE SPRAY 0.65 % NA SOLN: 1.0000 | Freq: Four times a day (QID) | NASAL | Status: DC | PRN

## 2024-07-09 MED ORDER — MENTHOL 3 MG MT LOZG: 1.0000 | LOZENGE | OROMUCOSAL | Status: DC | PRN

## 2024-07-09 MED ORDER — POTASSIUM CHLORIDE 10 MEQ/100ML IV SOLN
10.0000 meq | INTRAVENOUS | Status: DC
  Administered 2024-07-09: 10 meq via INTRAVENOUS
  Filled 2024-07-09 (×2): qty 100

## 2024-07-09 NOTE — Progress Notes (Signed)
 [facility]  PROGRESS NOTE  John [name] FMW:[id number] DOB: [birth year] DOA: 07/08/2024 PCP: [name], MD   Brief HPI:    63 y.o. male with medical history significant for Parkinson disease, mild cognitive impairment, PE in June 2025 on Eliquis , and BPH who presents with dark tarry stool, dark emesis, and abdominal pain.   Patient reports that he developed abdominal pain yesterday.  This has been associated with nausea, loose dark, tarry stools, and then dark emesis today.    Assessment/Plan:    Small bowel obstruction -Seen on CT abdomen/pelvis - General Surgery consulted, NG tube placed - Formal small bowel protocol ordered by general surgery; if contrast gets into colon in 8 hours, plan to remove NG tube and gradually advance diet  Dark tarry stools - Gastroenterology consulted - Patient is on Eliquis  which is currently on hold - Hemoglobin is down to 8.0 - Plan for EGD once SBO resolves  Syncope - Patient had syncope upon standing from bedside commode in ED - He has history of orthostatic hypotension, also hypovolemic in setting of nausea vomiting and diarrhea and GI bleed - Continue IV hydration, transfuse PRBC as needed  Acute kidney injury - Serum creatinine 1.70 on admission, it was 1.052 days earlier - No hydronephrosis seen on CT abdomen/pelvis - Likely prerenal in setting of nausea vomiting and GI bleed as above - Started on IV hydration, creatinine has improved to 1.39  History of pulmonary embolism - Had pulmonary embolism in June 2025 - Anticoagulation on hold   Parkinson disease, mild cognitive impairment - Sinemet  on hold  Hypokalemia - Potassium was 2.9 this morning - Replace potassium and follow BMP in am  Pericardial effusion - Small pericardial effusion noted on CT in the ED - Echocardiogram ordered; will follow the result        DVT prophylaxis: SCDs  Medications     pantoprazole  (PROTONIX ) IV  40 mg Intravenous  Q12H   sodium chloride  flush  3 mL Intravenous Q12H     Data Reviewed:   CBG:  Recent Labs  Lab 07/08/24 1702  GLUCAP 223*    SpO2: 100 %    Vitals:   07/09/24 0518 07/09/24 0600 07/09/24 0621 07/09/24 0742  BP:   138/80 139/85  Pulse:   (!) 126 (!) 128  Resp: 18   18  Temp: 97.7 F (36.5 C)  98.7 F (37.1 C) 98.2 F (36.8 C)  TempSrc: Oral  Oral Axillary  SpO2:   94% 100%  Weight:  103.1 kg    Height:  6' 2 (1.88 m)        Data Reviewed:  Basic Metabolic Panel: Recent Labs  Lab 07/06/24 0741 07/08/24 1608 07/08/24 1719 07/09/24 0237  NA 137 137 137 140  K 3.4* 3.0* 3.0* 2.9*  CL 106 102 104 108  CO2 21* 16*  --  17*  GLUCOSE 121* 197* 202* 135*  BUN 16 53* 49* 63*  CREATININE 1.05 1.70* 1.70* 1.39*  CALCIUM  8.8* 8.7*  --  7.8*  MG  --  2.0  --   --     CBC: Recent Labs  Lab 07/06/24 0741 07/08/24 1608 07/08/24 1719 07/08/24 2031 07/09/24 0237 07/09/24 0755  WBC 12.4* 12.4*  --  9.1 5.7 8.1  NEUTROABS 10.1* 8.1*  --   --   --   --   HGB 10.8* 9.5* 11.2* 8.6* 9.6* 8.0*  HCT 35.5* 31.3* 33.0* 27.9* 30.8* 25.4*  MCV 84.5 83.7  --  81.8 82.4 81.4  PLT 249 363  --  314 272 306    LFT Recent Labs  Lab 07/06/24 0741 07/08/24 1608  AST 26 38  ALT 8 6  ALKPHOS 79 74  BILITOT 1.4* 1.0  PROT 6.7 6.9  ALBUMIN 3.5 3.2*     Antibiotics: Anti-infectives (From admission, onward)    Start     Dose/Rate Route Frequency Ordered Stop   07/09/24 0600  piperacillin-tazobactam (ZOSYN) IVPB 3.375 g  Status:  Discontinued       Placed in Followed by Linked Group   3.375 g 12.5 mL/hr over 240 Minutes Intravenous Every 8 hours 07/08/24 2033 07/09/24 0825   07/08/24 2045  piperacillin-tazobactam (ZOSYN) IVPB 3.375 g       Placed in Followed by Linked Group   3.375 g 100 mL/hr over 30 Minutes Intravenous  Once 07/08/24 2033 07/08/24 2146        CONSULTS general surgery  Code Status: Full code  Family Communication: No family at  bedside     Subjective   Denies abdominal pain.  NG tube to wall suction in place   Objective    Physical Examination:   Appears in no acute distress S1-S2, regular, no murmur auscultated Abdomen is soft, nontender, no organomegaly Extremities no edema  Status is: Inpatient:             John [name]   [facility] If 7PM-7AM, please contact night-coverage at [email or web address], Office  [phone]   07/09/2024, 8:34 AM  LOS: 1 day

## 2024-07-09 NOTE — Consult Note (Addendum)
 Referring Provider: Dr. Sabas Brod Primary Care Physician:  [name], MD Primary Gastroenterologist:  Sampson   Reason for Consultation: Hematemesis, dark stools and SBO.    HPI: John [name] is a 64 y.o. male with a past medical history of hypertension, bilateral PE on Eliquis , Parkinson's disease, frequent falls at home, subdural hematoma 03/2024, seizures, anemia and GERD.  Patient presented from [facility] to the ED 07/06/2024 due to a witnessed fall.  WBC 12.4.  Hemoglobin 10.8 with a baseline hemoglobin around 9.32 months ago.  Total bili 1.4.  Normal alk phos, AST and ALT levels.  Head CT without acute intracranial abnormality or traumatic injury, forehead soft tissue swelling decreased from prior imaging.  Cervical CT showed advanced cervical spine degeneration, ankylosis at C5-6 and moderate to severe chronic spinal stenosis at C3-C4. His clinical status was stable and he was discharged to [name].   Patient presented to the ED 07/08/2024 from [facility] with N/V, coffee ground emesis, tarry loose stools and abdominal pain. On Eliquis . Labs in the ED showed a WBC count of 12.4.  Hemoglobin 9.5.  Hematocrit 31.3.  Platelets 363.  INR 1.8.  Sodium 137.  Potassium 3.0.  BUN 53.  Creatinine 1.70 up from 1.05 three days ago.  Albumin 3.2.  Total bili 1.0.  Alk phos 74.  AST 38.  ALT 6.  FOBT positive.  Head CT negative for acute intracranial abnormality.  Abdominal/pelvic CTA was negative for active GI bleeding,  identified a small bowel obstruction with transition from dilated to nondilated bowel within the distal jejunum/ileum within the lower pelvis, gallbladder wall thickening with pericholecystic fat stranding concerning for acute cholecystitis without gallstones, a fluid -filled distal thoracic esophagus digestive of reflux yesterday and scattered gas/fluid levels within the colon consistent with diarrhea versus recent laxative use and a small pericardial  effusion was noted.  Obtaining a history is challenging at this time as his RN is at the bedside adjusting his NG tube which is missing a cap. Tech at bedside to perform ECHO at this time. He endorses vomiting coffee-ground emesis and passing black tarry stool for 2 days prior to admission.  On Eliquis  due to history of PE.  He is unable to tell me when he last took Eliquis .  He has a history of GERD and he cannot recall if he has ever had an EGD.  He underwent a Cologuard test 03/2023 which was negative.  He denies ever having a colonoscopy.  He stated residing at [facility] since 03/2024 due to having multiple falls at home.   General surgery consulted, ordered Gastrografin small bowel protocol.  I attempted to contact the nursing staff at [facility] to obtain further symptom update, however, I was unable to speak to the nursing staff at this time.  ECHO today: Pending  ECHO 03/10/2024: IMPRESSIONS Left ventricular ejection fraction, by estimation, is 60 to 65%. The left ventricle has normal function. The left ventricle has no regional wall motion abnormalities. Left ventricular diastolic parameters are consistent with Grade I diastolic dysfunction (impaired relaxation). 1. 2. Right ventricular systolic function is normal. The right ventricular size is normal. 3. Left atrial size was mildly dilated. 4. Right atrial size was mildly dilated. 5. There is no evidence of cardiac tamponade. 6. The mitral valve is grossly normal. Trivial mitral valve regurgitation. 7. The aortic valve is tricuspid. Aortic valve regurgitation is not visualized. The inferior vena cava is normal in size with greater than  50% respiratory variability, suggesting right atrial pressure of 3 mmHg.    GI PROCEDURES:  Cologuard 04/14/2023: Negative   Past Medical History:  Diagnosis Date   Acute metabolic encephalopathy 03/24/2024   Acute pulmonary embolism 03/10/2024   AKI (acute kidney injury) 03/16/2024   ARF  (acute renal failure) 03/10/2024   Bilateral pulmonary embolism 03/16/2024   BPH with urinary obstruction 03/24/2024   Cervical myelopathy    Essential hypertension 10/29/2021   Family history of prostate cancer 10/29/2021   Frequent falls 03/24/2024   Gastroesophageal reflux disease without esophagitis 10/29/2021   Hypokalemia 03/10/2024   Hypomagnesemia 03/24/2024   Hyponatremia 03/10/2024   Impairment of balance 03/12/2022   Laceration of head 03/24/2024   Lactic acidosis 03/16/2024   Lateral epicondylitis of right elbow 03/10/2024   Lymphedema 03/12/2022   Maxillary fracture 03/10/2024   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 Consider a repeat head CT to assess for any acute hemorrhage within the hematomas. Balanced mass effect upon the underlying cerebral hemispheres without midline shift   Tinnitus of both ears 12/10/2023   Urinary tract infection 03/24/2024    Past Surgical History:  Procedure Laterality Date   RADIOLOGY WITH ANESTHESIA N/A 03/29/2024   Procedure: RADIOLOGY WITH ANESTHESIA;  Surgeon: Radiologist, Medication, MD;  Location: [facility] OR;  Service: Radiology;  Laterality: N/A;    Prior to Admission medications   Medication Sig Start Date End Date Taking? Authorizing Provider  acetaminophen  (TYLENOL ) 500 MG tablet Take 1 tablet (500 mg total) by mouth every 6 (six) hours as needed for mild pain (pain score 1-3), moderate pain (pain score 4-6) or  headache. 03/16/24  Yes Hongalgi, Anand D, MD  apixaban  (ELIQUIS ) 5 MG TABS tablet Take 2 tabs (10 mg total) twice a day through 03/18/2024.  From 03/19/2024, start taking 1 tab (5 Mg total) twice daily. 03/16/24  Yes Hongalgi, Anand D, MD  carbidopa -levodopa  (SINEMET  CR) 50-200 MG tablet Take 1 tablet by mouth 6 (six) times daily. (4-5 AM, 8 AM, 11 AM, 2 PM, 5 PM, 8 PM). 12/08/23  Yes Jaffe, Adam R, DO  cyanocobalamin  1000 MCG tablet Take 1 tablet (1,000 mcg total) by mouth daily. 03/23/24  Yes Rashid, Farhan, MD  HYDROcodone -acetaminophen  (NORCO/VICODIN) 5-325 MG tablet Take 1 tablet by mouth every 8 (eight) hours as needed for severe pain (pain score 7-10). 04/16/24  Yes Dennise Lavada POUR, MD  midodrine  (PROAMATINE ) 5 MG tablet Take 1 tablet (5 mg total) by mouth 2 (two) times daily with a meal. 04/16/24  Yes Singh, Prashant K, MD  Multiple Vitamin (MULTIVITAMIN WITH MINERALS) TABS tablet Take 1 tablet by mouth daily. 03/23/24  Yes Dino Antu, MD  omeprazole  (PRILOSEC  OTC) 20 MG tablet Take 20 mg by mouth daily.   Yes [provider]  pyridOXINE  (B-6) 50 MG tablet Take 1 tablet (50 mg total) by mouth daily. 04/17/24  Yes Dennise Lavada POUR, MD  thiamine  (VITAMIN B-1) 100 MG tablet Take 1 tablet (100 mg  total) by mouth daily. 04/17/24  Yes Dennise Lavada POUR, MD    Current Facility-Administered Medications  Medication Dose Route Frequency Provider Last Rate Last Admin   0.9 % NaCl with KCl 20 mEq/ L  infusion   Intravenous Continuous Opyd, Timothy S, MD 100 mL/hr at 07/09/24 0640 New Bag at 07/09/24 0640   acetaminophen  (TYLENOL ) tablet 650 mg  650 mg Oral Q6H PRN Opyd, Timothy S, MD       Or   acetaminophen  (TYLENOL ) suppository 650 mg  650 mg Rectal Q6H PRN Opyd, Timothy S, MD       HYDROmorphone (DILAUDID) injection 0.5 mg  0.5 mg Intravenous Q3H PRN Opyd, Timothy S, MD   0.5 mg at 07/09/24 9773   pantoprazole  (PROTONIX ) injection 40 mg  40 mg Intravenous Q12H Opyd, Timothy S, MD   40 mg at 07/09/24  0503   piperacillin-tazobactam (ZOSYN) IVPB 3.375 g  3.375 g Intravenous Q8H Gaines Carrier, [facility]       potassium chloride  10 mEq in 100 mL IVPB  10 mEq Intravenous Q1 Hr x 3 Opyd, Timothy S, MD 100 mL/hr at 07/09/24 0644 10 mEq at 07/09/24 9355   prochlorperazine  (COMPAZINE ) injection 5 mg  5 mg Intravenous Q4H PRN Opyd, Timothy S, MD   5 mg at 07/09/24 0225   sodium chloride  flush (NS) 0.9 % injection 3 mL  3 mL Intravenous Q12H Opyd, Evalene RAMAN, MD   3 mL at 07/08/24 2113    Allergies as of 07/08/2024 - Review Complete 07/08/2024  Allergen Reaction Noted   Flomax  [tamsulosin  hcl] Anxiety and Other (See Comments) 03/16/2024    Family History  Problem Relation Age of Onset   Cancer Father     Social History   Socioeconomic History   Marital status: Divorced    Spouse name: Not on file   Number of children: Not on file   Years of education: 16   Highest education level: Bachelor's degree (e.g., BA, AB, BS)  Occupational History   Occupation: Unemployed    Comment: Retail - [employer]  Tobacco Use   Smoking status: Former    Types: Cigars    Quit date: 2000    Years since quitting: 25.8   Smokeless tobacco: Never   Tobacco comments:    Quit 2000  Vaping Use   Vaping status: Never Used  Substance and Sexual Activity   Alcohol use: Not Currently   Drug use: Never   Sexual activity: Not Currently    Partners: Female    Birth control/protection: None  Other Topics Concern   Not on file  Social History Narrative   ** Merged History Encounter **       Right handed   Social Drivers of Health   Financial Resource Strain: Not on file  Food Insecurity: Patient Unable To Answer (03/25/2024)   Hunger Vital Sign    Worried About Running Out of Food in the Last Year: Patient unable to answer    Ran Out of Food in the Last Year: Patient unable to answer  Transportation Needs: Patient Unable To Answer (03/25/2024)   PRAPARE - Transportation    Lack of Transportation (Medical):  Patient unable to answer    Lack of Transportation (Non-Medical): Patient unable to answer  Physical Activity: Not on file  Stress: Not on file  Social Connections: Not on file  Intimate Partner Violence: Patient Unable To Answer (03/25/2024)   Humiliation, Afraid, Rape, and Kick questionnaire    Fear of Current  or Ex-Partner: Patient unable to answer    Emotionally Abused: Patient unable to answer    Physically Abused: Patient unable to answer    Sexually Abused: Patient unable to answer   Review of Systems: Gen: Denies fever, sweats or chills. No weight loss.  CV: Denies chest pain, palpitations or edema. Resp: Denies cough, shortness of breath of hemoptysis.  GI: See HPI. GU : Denies urinary burning, blood in urine, increased urinary frequency or incontinence. MS: Denies joint pain, muscles aches or weakness. Derm: Denies rash, itchiness, skin lesions or unhealing ulcers. Psych: Denies depression, anxiety, memory loss or confusion. Heme: Denies easy bruising, bleeding. Neuro:  Denies headaches, dizziness or paresthesias. Endo:  Denies any problems with DM, thyroid or adrenal function.  Physical Exam: Vital signs in last 24 hours: Temp:  [97.7 F (36.5 C)-98.7 F (37.1 C)] 98.7 F (37.1 C) (10/24 0621) Pulse Rate:  [91-126] 126 (10/24 0621) Resp:  [15-29] 18 (10/24 0518) BP: (78-153)/(42-113) 138/80 (10/24 0621) SpO2:  [94 %-100 %] 94 % (10/24 0621) Weight:  [103.1 kg] 103.1 kg (10/24 0600) Last BM Date : 07/09/24 General:  Fatigued, ill appearing 63 year old male in no acute distress. Head:  Normocephalic and atraumatic. Eyes:  No scleral icterus. Conjunctiva pink. Ears:  Normal auditory acuity. Nose:  No deformity, discharge or lesions. Mouth:  Dentition intact. No ulcers or lesions.  Neck:  Supple. No lymphadenopathy or thyromegaly.  Lungs: Breath sounds clear throughout. No wheezes, rhonchi or crackles.  Heart: Regular rate and rhythm, no murmurs. Abdomen: Soft,  mild distention.  Mild RLQ tenderness without rebound or guarding. Hypoactive bowel sounds auscultated to all 4 quadrants.  NG tube clamped.  Emesis container with 700 cc dark brown liquid. Rectal: Deferred.  FOBT positive. Musculoskeletal:  Symmetrical without gross deformities.  Pulses:  Normal pulses noted. Extremities:  Without clubbing or edema. Neurologic:  Alert and  oriented x 3.  Moves all extremities equally.  Speech is softly spoken but clear. Skin:  Intact without significant lesions or rashes. Psych:  Alert and cooperative. Normal mood and affect.  Intake/Output from previous day: No intake/output data recorded. Intake/Output this shift: No intake/output data recorded.  Lab Results: Recent Labs    07/08/24 1608 07/08/24 1719 07/08/24 2031 07/09/24 0237  WBC 12.4*  --  9.1 5.7  HGB 9.5* 11.2* 8.6* 9.6*  HCT 31.3* 33.0* 27.9* 30.8*  PLT 363  --  314 272   BMET Recent Labs    07/08/24 1608 07/08/24 1719 07/09/24 0237  NA 137 137 140  K 3.0* 3.0* 2.9*  CL 102 104 108  CO2 16*  --  17*  GLUCOSE 197* 202* 135*  BUN 53* 49* 63*  CREATININE 1.70* 1.70* 1.39*  CALCIUM  8.7*  --  7.8*   LFT Recent Labs    07/08/24 1608  PROT 6.9  ALBUMIN 3.2*  AST 38  ALT 6  ALKPHOS 74  BILITOT 1.0   PT/INR Recent Labs    07/08/24 1608  LABPROT 21.6*  INR 1.8*   Hepatitis Panel No results for input(s): HEPBSAG, HCVAB, HEPAIGM, HEPBIGM in the last 72 hours.    Studies/Results: DG Abdomen 1 View Result Date: 07/09/2024 EXAM: 1 VIEW XRAY OF THE ABDOMEN 07/09/2024 05:08:37 AM COMPARISON: None available. CLINICAL HISTORY: Evaluate enteric tube placement. FINDINGS: LINES, TUBES AND DEVICES: Enteric tube in place with tip and side port projecting over the distal stomach. BOWEL: Partially imaged upper abdomen shows gaseous dilation of small bowel loops. SOFT TISSUES: No opaque  urinary calculi. BONES: No acute osseous abnormality. IMPRESSION: 1. Enteric tube is in  place with tip and side port over the distal stomach. 2. Gaseous dilation of small bowel loops in the partially imaged upper abdomen. Electronically signed by: Waddell Calk MD 07/09/2024 06:31 AM EDT RP Workstation: [id number]   CT Angio Abd/Pel W and/or Wo Contrast Result Date: 07/08/2024 CLINICAL DATA:  Lower gastrointestinal bleeding, dark stool since this morning, nausea EXAM: CTA ABDOMEN AND PELVIS WITHOUT AND WITH CONTRAST TECHNIQUE: Multidetector CT imaging of the abdomen and pelvis was performed using the standard protocol during bolus administration of intravenous contrast. Multiplanar reconstructed images and MIPs were obtained and reviewed to evaluate the vascular anatomy. RADIATION DOSE REDUCTION: This exam was performed according to the departmental dose-optimization program which includes automated exposure control, adjustment of the mA and/or kV according to patient size and/or use of iterative reconstruction technique. CONTRAST:  80mL OMNIPAQUE  IOHEXOL  350 MG/ML SOLN COMPARISON:  03/10/2024 FINDINGS: VASCULAR Aorta: Normal caliber aorta without aneurysm, dissection, vasculitis or significant stenosis. Celiac: Patent without evidence of aneurysm, dissection, vasculitis or significant stenosis. SMA: Patent without evidence of aneurysm, dissection, vasculitis or significant stenosis. Renals: Both renal arteries are patent without evidence of aneurysm, dissection, vasculitis, fibromuscular dysplasia or significant stenosis. IMA: Patent without evidence of aneurysm, dissection, vasculitis or significant stenosis. Inflow: Patent without evidence of aneurysm, dissection, vasculitis or significant stenosis. Proximal Outflow: Bilateral common femoral and visualized portions of the superficial and profunda femoral arteries are patent without evidence of aneurysm, dissection, vasculitis or significant stenosis. Veins: No acute venous abnormalities. Review of the MIP images confirms the above findings.  NON-VASCULAR Lower chest: No acute pleural or parenchymal lung disease. Fluid-filled distal thoracic esophagus may reflect sequela of reflux. Small pericardial effusion. Hepatobiliary: Simple right lobe liver cyst is not require follow-up. Remainder of the liver is unremarkable. The gallbladder is moderately distended, with gallbladder wall thickening and pericholecystic fluid concerning for acute cholecystitis. No calcified gallstones. No biliary duct dilation. Pancreas: Unremarkable. No pancreatic ductal dilatation or surrounding inflammatory changes. Spleen: Normal in size without focal abnormality. Adrenals/Urinary Tract: Adrenal glands are unremarkable. Kidneys are normal, without renal calculi, focal lesion, or hydronephrosis. Bladder is unremarkable. Stomach/Bowel: There is marked distension of the stomach and small bowel, with jejunum measuring up to 5.5 cm in diameter. Transition from dilated to nondilated small bowel within the lower pelvis at the level of the distal jejunum/ileum. Findings are consistent with small-bowel obstruction. Scattered gas fluid levels within the: Compatible with diarrhea or recent laxative/enema use. There is no intraluminal accumulation of contrast to suggest active gastrointestinal bleeding. Lymphatic: No pathologic adenopathy. Reproductive: Prostate is unremarkable. Other: No free fluid or free intraperitoneal gas. No abdominal wall hernia. Musculoskeletal: No acute or destructive bony abnormalities. Reconstructed images demonstrate no additional findings. IMPRESSION: VASCULAR 1. No evidence of active gastrointestinal bleeding. 2. No significant vascular abnormalities. NON-VASCULAR 1. Small-bowel obstruction, with transition from dilated to nondilated bowel within the distal jejunum/ileum within the lower pelvis. 2. Gallbladder wall thickening and pericholecystic fat stranding concerning for acute cholecystitis. No calcified gallstones. 3. Fluid-filled distal thoracic  esophagus which may reflect sequela of reflux. 4. Scattered gas fluid levels within the colon, consistent with diarrhea or recent laxative/enema use. 5. Small pericardial effusion. Electronically Signed   By: Ozell Daring M.D.   On: 07/08/2024 18:02   CT Head Wo Contrast Result Date: 07/08/2024 EXAM: CT HEAD WITHOUT CONTRAST 07/08/2024 05:51:00 PM TECHNIQUE: CT of the head was performed without the administration of intravenous contrast.  Automated exposure control, iterative reconstruction, and/or weight based adjustment of the mA/kV was utilized to reduce the radiation dose to as low as reasonably achievable. COMPARISON: 07/06/2024 CLINICAL HISTORY: Fall on blood thinners. Possible GI bleed. FINDINGS: BRAIN AND VENTRICLES: No acute hemorrhage. No evidence of acute infarct. No hydrocephalus. No extra-axial collection. No mass effect or midline shift. Chronic microvascular ischemia and generalized atrophy. ORBITS: No acute abnormality. SINUSES: No acute abnormality. SOFT TISSUES AND SKULL: No acute soft tissue abnormality. No skull fracture. IMPRESSION: 1. No acute intracranial abnormality. Electronically signed by: Norman Gatlin MD 07/08/2024 05:57 PM EDT RP Workstation: [id number]    IMPRESSION/PLAN:  63 year old male admitted with N/V with coffee ground emesis and dark tarry stools x 2 days with acute on chronic anemia.  Positive FOBT. On Eliquis . Hg 9.5. - NPO - Transfuse for hemoglobin level < 8 or as needed if symptomatic - Eventual EGD after SBO resolves - PPI IV twice daily - Await further recommendations per Dr. Legrand  Generalized abdominal pain, small bowel obstruction. Abdominal/pelvic CTA identified a small bowel obstruction with transition from dilated to nondilated bowel within the distal jejunum/ileum within the lower pelvis.  Received Zosyn IV x 1.  NGT placed, 700 cc of dark brown liquid in canister. General surgery consulted, ordered SBO protocol. - Await SBO protocol results  -  Appreciate general surgery recommendations - NPO - NG tube currently clamped  GERD. CTA showed fluid-filled distal thoracic esophagus which may reflect sequela of reflux. - See plan above  CTA showed thickened gallbladder with some stranding  AKI - IV fluids per the hospitalist   History of PE 02/2024, Eliquis  on hold since admission 7/23  Small pericardial effusion per CTA.  ECHO at the bedside in process.  Parkinson's disease   John [name]  07/09/2024, 11:43 AM  I have taken an interval history, thoroughly reviewed the chart and examined the patient. I agree with the Advanced Practitioner's note, impression and recommendations, and have recorded additional findings, impressions and recommendations below. I performed a substantive portion of this encounter (>50% time spent), including a complete performance of the medical decision making.  My additional thoughts are as follows:  Hematemesis with coffee grounds upper GI output from NG tube in the setting of mid small bowel obstruction.  Strange that he has an SBO without known prior history of SBO.  Surgical service has seen him and instituted SBO protocol with plans to do a small bowel series.  Difficult to get a reliable history from him, sounds like he may have passed some liquid stool today but he is not sure. I will need to confirm some conflicting reports about whether he is on or off Eliquis  at the time of admission (particularly after his subdural hematoma several months ago), but even if he is on at the time of admission it is being held given the current clinical situation.  Agree that an upper endoscopy prior to discharge and after resolution of the SBO is warranted.  Timing to be determined.  Abdomen is soft and nontender right now, so he has decompressed well and does not appear to have compromised bowel.  We will follow _________________  This consultation required a moderate degree of medical decision  making due to the nature and complexity of the acute condition(s) being evaluated as well as the patient's medical comorbidities.  Victory LITTIE Legrand III Office:[phone]

## 2024-07-09 NOTE — Progress Notes (Signed)
 Overnight floor coverage  General surgery managing SBO and NG tube was placed.  General surgery had ordered small bowel protocol and informed by radiology that abdominal x-ray is showing: IMPRESSION: 1. Small bowel obstruction with numerous dilated loops measuring up to 6.8 cm. 2. No contrast seen within small bowel or colon. Minimal colonic gas present.  I have discussed with on-call provider for general surgery Dr. Teresa.  Recommending continuing current management/NPO/NG tube.

## 2024-07-09 NOTE — Plan of Care (Signed)
  Problem: Clinical Measurements: Goal: Ability to maintain clinical measurements within normal limits will improve Outcome: Progressing Goal: Will remain free from infection Outcome: Progressing Goal: Diagnostic test results will improve Outcome: Progressing Goal: Respiratory complications will improve Outcome: Progressing Goal: Cardiovascular complication will be avoided Outcome: Progressing   Problem: Coping: Goal: Level of anxiety will decrease Outcome: Progressing   Problem: Pain Managment: Goal: General experience of comfort will improve and/or be controlled Outcome: Progressing   Problem: Safety: Goal: Ability to remain free from injury will improve Outcome: Progressing   Problem: Skin Integrity: Goal: Risk for impaired skin integrity will decrease Outcome: Progressing

## 2024-07-09 NOTE — Progress Notes (Signed)
 07/09/2024  John [name] [id number] 01-Jul-1961  CARE TEAM: PCP: [name], MD  Outpatient Care Team: Patient Care Team: [name], MD as PCP - General (Family Medicine) Skeet Juliene SAUNDERS, DO as Consulting Physician (Neurology) [name], MD (Family Medicine)  Inpatient Treatment Team: Treatment Team:  Drusilla Sabas RAMAN, MD Ccs, Md, MD Bobbette Bianchi, MD Cockett, Laurey RAMAN, RN Ruthe Broker, NT Elgin Lonni BRAVO, RN Margarite Barnie DASEN, RN Marget Christopher BIRCH, NT Sudie Erminio MARLA, RN Danis, Victory LITTIE MOULD, MD   Problem List:   Principal Problem:   SBO (small bowel obstruction) [facility]) Active Problems:   Parkinson's disease with fluctuating manifestations   Hypokalemia   AKI (acute kidney injury)   Bilateral pulmonary embolism   GI bleeding   Syncope   Acute cholecystitis   Pericardial effusion   * No surgery found *      Assessment [facility] Stay = 1 days)      Coffee-ground effluent suspicious for some gastritis and chronic anticoagulation.  Looks old blood.  Question of bowel striction with possible resolution.    Plan:  Given the patient's limited verbal capacity to follow, I agree with doing a formal small bowel protocol just in case.  I wrote for that.  If contrast gets into colon in 8 hours, remove NG tube and gradually advance diet.  Agree with PPI  Agree with Gastroenterology consultation to consider EGD and/or rule out gastritis or other issues for the dark gastric return that makes me wonder about gastritis or some other etiology.  Some concern of cholecystitis on the CAT scan but I think it is an over read.  He does not seem to give his story of biliary colic and certainly no Murphy sign.  Hard to rely.  Did not really have much in the way significant leukocytosis.  I one and they are side holding antibiotics for now and following clinically.  If he rapidly improves and tolerates p.o. without any symptoms, that is reassuring.   If he does get recurrent colic or worsening leukocytosis/fevers; then, agree with considering HIDA scan.  I would hold off for now and deal with the hematochezia and possible bowel obstruction before chasing a third thing  Agree with holding anticoagulation.  For iron deficiency panel.  Did not seem that severe earlier this year but precipitous drop raises concerns.  -monitor electrolytes & replace as needed.  Primary service already aggressively replacing potassium.  Follow.  Consider checking mag/Phos as well. Keep K>4, Mg>2, Phos>3  -VTE prophylaxis- SCDs.  Anticoagulation prophyllaxis SQ as appropriate  -mobilize as tolerated to help recovery.  Enlist therapies in moderate/high risk patients as appropriate  I updated the patient's status to the patient and nurse  Recommendations were made.  Questions were answered.  They expressed understanding & appreciation.  -Disposition: TBD       I reviewed nursing notes, Consultant GI notes, hospitalist notes, last 24 h vitals and pain scores, last 48 h intake and output, last 24 h labs and trends, and last 24 h imaging results.  I have reviewed this patient's available data, including medical history, events of note, test results, etc as part of my evaluation.   A significant portion of that time was spent in counseling. Care during the described time interval was provided by me.  This care required moderate level of medical decision making.  07/09/2024    Subjective: (Chief complaint)  Patient denies much abdominal pain.  Looks like he is  having some loose bowel movements.  Objective:  Vital signs:  Vitals:   07/09/24 0518 07/09/24 0600 07/09/24 0621 07/09/24 0742  BP:   138/80 139/85  Pulse:   (!) 126 (!) 128  Resp: 18   18  Temp: 97.7 F (36.5 C)  98.7 F (37.1 C) 98.2 F (36.8 C)  TempSrc: Oral  Oral Axillary  SpO2:   94% 100%  Weight:  103.1 kg    Height:  6' 2 (1.88 m)      Last BM Date :  07/09/24  Intake/Output   Yesterday:  No intake/output data recorded. This shift:  No intake/output data recorded.  Bowel function:  Flatus: YES  BM:  YES  Drain: NG tube with thin/thick black effluent   Physical Exam:  General: Pt awake/alert in no acute distress.  Not toxic.  Not sickly. Eyes: PERRL, normal EOM.  Sclera clear.  No icterus Neuro: CN II-XII intact w/o focal sensory/motor deficits.  Some dysarthria with mumbling.  Challenged to fully understand what he is saying but gradually able to sort things out on simple questions. Lymph: No head/neck/groin lymphadenopathy Psych:  No delerium/psychosis/paranoia.  Oriented x 2 HENT: Normocephalic, Mucus membranes moist.  No thrush Neck: Supple, No tracheal deviation.  No obvious thyromegaly Chest: No pain to chest wall compression.  Good respiratory excursion.  No audible wheezing CV:  Pulses intact.  Regular rhythm.  No major extremity edema MS: Normal AROM mjr joints.  No obvious deformity  Abdomen: Soft.  Mildy distended.  Nontender.  No evidence of peritonitis.  No incarcerated hernias.  Ext:   No deformity.  No mjr edema.  No cyanosis Skin: No petechiae / purpurea.  No major sores.  Warm and dry    Results:   Cultures: No results found for this or any previous visit (from the past 720 hours).  Labs: Results for orders placed or performed during the hospital encounter of 07/08/24 (from the past 48 hours)  POC occult blood, ED     Status: Abnormal   Collection Time: 07/08/24  4:06 PM  Result Value Ref Range   Fecal Occult Bld POSITIVE (A) NEGATIVE  Comprehensive metabolic panel     Status: Abnormal   Collection Time: 07/08/24  4:08 PM  Result Value Ref Range   Sodium 137 135 - 145 mmol/L   Potassium 3.0 (L) 3.5 - 5.1 mmol/L   Chloride 102 98 - 111 mmol/L   CO2 16 (L) 22 - 32 mmol/L   Glucose, Bld 197 (H) 70 - 99 mg/dL    Comment: Glucose reference range applies only to samples taken after fasting for at  least 8 hours.   BUN 53 (H) 8 - 23 mg/dL   Creatinine, Ser 8.29 (H) 0.61 - 1.24 mg/dL   Calcium  8.7 (L) 8.9 - 10.3 mg/dL   Total Protein 6.9 6.5 - 8.1 g/dL   Albumin 3.2 (L) 3.5 - 5.0 g/dL   AST 38 15 - 41 U/L   ALT 6 0 - 44 U/L   Alkaline Phosphatase 74 38 - 126 U/L   Total Bilirubin 1.0 0.0 - 1.2 mg/dL   GFR, Estimated 45 (L) >60 mL/min    Comment: (NOTE) Calculated using the CKD-EPI Creatinine Equation (2021)    Anion gap 19 (H) 5 - 15    Comment: Performed at [facility], [address] [place], [place] [address]  CBC with Differential     Status: Abnormal   Collection Time: 07/08/24  4:08  PM  Result Value Ref Range   WBC 12.4 (H) 4.0 - 10.5 K/uL   RBC 3.74 (L) 4.22 - 5.81 MIL/uL   Hemoglobin 9.5 (L) 13.0 - 17.0 g/dL   HCT 68.6 (L) 60.9 - 47.9 %   MCV 83.7 80.0 - 100.0 fL   MCH 25.4 (L) 26.0 - 34.0 pg   MCHC 30.4 30.0 - 36.0 g/dL   RDW 83.5 (H) 88.4 - 84.4 %   Platelets 363 150 - 400 K/uL   nRBC 0.0 0.0 - 0.2 %   Neutrophils Relative % 65 %   Neutro Abs 8.1 (H) 1.7 - 7.7 K/uL   Lymphocytes Relative 22 %   Lymphs Abs 2.7 0.7 - 4.0 K/uL   Monocytes Relative 12 %   Monocytes Absolute 1.4 (H) 0.1 - 1.0 K/uL   Eosinophils Relative 1 %   Eosinophils Absolute 0.1 0.0 - 0.5 K/uL   Basophils Relative 0 %   Basophils Absolute 0.0 0.0 - 0.1 K/uL   Immature Granulocytes 0 %   Abs Immature Granulocytes 0.04 0.00 - 0.07 K/uL    Comment: Performed at [facility], [address], [place], [place] [address]  Protime-INR     Status: Abnormal   Collection Time: 07/08/24  4:08 PM  Result Value Ref Range   Prothrombin Time 21.6 (H) 11.4 - 15.2 seconds   INR 1.8 (H) 0.8 - 1.2    Comment: (NOTE) INR goal varies based on device and disease states. Performed at [facility], [address], [place], [place] [address]   Lipase, blood     Status: None   Collection Time: 07/08/24  4:08 PM  Result Value Ref Range   Lipase 16 11 - 51 U/L    Comment: Performed at  [facility], [address] [place], [place] [address]  APTT     Status: None   Collection Time: 07/08/24  4:08 PM  Result Value Ref Range   aPTT 33 24 - 36 seconds    Comment: Performed at [facility], [address], [place], [place] [address]  Magnesium      Status: None   Collection Time: 07/08/24  4:08 PM  Result Value Ref Range   Magnesium  2.0 1.7 - 2.4 mg/dL    Comment: Performed at [facility], [address] [place], [place] [address]  Fibrinogen     Status: Abnormal   Collection Time: 07/08/24  4:23 PM  Result Value Ref Range   Fibrinogen >800 (H) 210 - 475 mg/dL    Comment: (NOTE) Fibrinogen results may be underestimated in patients receiving thrombolytic therapy. Performed at [facility], [address] [place], [place] [address]   CBG monitoring, ED     Status: Abnormal   Collection Time: 07/08/24  5:02 PM  Result Value Ref Range   Glucose-Capillary 223 (H) 70 - 99 mg/dL    Comment: Glucose reference range applies only to samples taken after fasting for at least 8 hours.  Type and screen [facility]     Status: None   Collection Time: 07/08/24  5:03 PM  Result Value Ref Range   ABO/RH(D) B POS    Antibody Screen NEG    Sample Expiration      07/11/2024,2359 Performed at [facility], [address] [place], [place] [address]   I-stat chem 8, ED (not at [facility], [facility] or [facility])     Status: Abnormal  Collection Time: 07/08/24  5:19 PM  Result Value Ref Range   Sodium 137 135 - 145 mmol/L   Potassium 3.0 (L) 3.5 - 5.1 mmol/L   Chloride 104 98 - 111 mmol/L   BUN 49 (H) 8 - 23 mg/dL   Creatinine, Ser 8.29 (H) 0.61 - 1.24 mg/dL   Glucose, Bld 797 (H) 70 - 99 mg/dL    Comment: Glucose reference range applies only to samples taken after fasting for at least 8 hours.   Calcium , Ion 1.07 (L) 1.15 - 1.40 mmol/L   TCO2 17 (L) 22 - 32 mmol/L   Hemoglobin 11.2 (L) 13.0 - 17.0 g/dL   HCT 66.9 (L) 60.9 - 47.9 %  ABO/Rh      Status: None   Collection Time: 07/08/24  5:19 PM  Result Value Ref Range   ABO/RH(D)      B POS Performed at [facility], [address], [place], [place] [address]   I-Stat CG4 Lactic Acid     Status: Abnormal   Collection Time: 07/08/24  5:20 PM  Result Value Ref Range   Lactic Acid, Venous 6.1 (HH) 0.5 - 1.9 mmol/L   Comment NOTIFIED PHYSICIAN   I-Stat CG4 Lactic Acid     Status: None   Collection Time: 07/08/24  7:26 PM  Result Value Ref Range   Lactic Acid, Venous 1.3 0.5 - 1.9 mmol/L  CBC     Status: Abnormal   Collection Time: 07/08/24  8:31 PM  Result Value Ref Range   WBC 9.1 4.0 - 10.5 K/uL   RBC 3.41 (L) 4.22 - 5.81 MIL/uL   Hemoglobin 8.6 (L) 13.0 - 17.0 g/dL   HCT 72.0 (L) 60.9 - 47.9 %   MCV 81.8 80.0 - 100.0 fL   MCH 25.2 (L) 26.0 - 34.0 pg   MCHC 30.8 30.0 - 36.0 g/dL   RDW 83.7 (H) 88.4 - 84.4 %   Platelets 314 150 - 400 K/uL   nRBC 0.0 0.0 - 0.2 %    Comment: Performed at [facility], [address] [place], [place] [address]  Basic metabolic panel     Status: Abnormal   Collection Time: 07/09/24  2:37 AM  Result Value Ref Range   Sodium 140 135 - 145 mmol/L   Potassium 2.9 (L) 3.5 - 5.1 mmol/L   Chloride 108 98 - 111 mmol/L   CO2 17 (L) 22 - 32 mmol/L   Glucose, Bld 135 (H) 70 - 99 mg/dL    Comment: Glucose reference range applies only to samples taken after fasting for at least 8 hours.   BUN 63 (H) 8 - 23 mg/dL   Creatinine, Ser 8.60 (H) 0.61 - 1.24 mg/dL   Calcium  7.8 (L) 8.9 - 10.3 mg/dL   GFR, Estimated 57 (L) >60 mL/min    Comment: (NOTE) Calculated using the CKD-EPI Creatinine Equation (2021)    Anion gap 15 5 - 15    Comment: Performed at [facility], [address], [place], [place] [address]  CBC     Status: Abnormal   Collection Time: 07/09/24  2:37 AM  Result Value Ref Range   WBC 5.7 4.0 - 10.5 K/uL   RBC 3.74 (L) 4.22 - 5.81 MIL/uL   Hemoglobin 9.6 (L) 13.0 - 17.0 g/dL   HCT 69.1 (L) 60.9 - 47.9 %   MCV  82.4 80.0 - 100.0 fL   MCH 25.7 (L) 26.0 - 34.0 pg  MCHC 31.2 30.0 - 36.0 g/dL   RDW 83.8 (H) 88.4 - 84.4 %   Platelets 272 150 - 400 K/uL   nRBC 0.0 0.0 - 0.2 %    Comment: Performed at [facility], [address] [place], [place] [address]  CBC     Status: Abnormal   Collection Time: 07/09/24  7:55 AM  Result Value Ref Range   WBC 8.1 4.0 - 10.5 K/uL   RBC 3.12 (L) 4.22 - 5.81 MIL/uL   Hemoglobin 8.0 (L) 13.0 - 17.0 g/dL   HCT 74.5 (L) 60.9 - 47.9 %   MCV 81.4 80.0 - 100.0 fL   MCH 25.6 (L) 26.0 - 34.0 pg   MCHC 31.5 30.0 - 36.0 g/dL   RDW 83.6 (H) 88.4 - 84.4 %   Platelets 306 150 - 400 K/uL   nRBC 0.0 0.0 - 0.2 %    Comment: Performed at [facility], [address], [place], [place] [address]    Imaging / Studies: DG Abdomen 1 View Result Date: 07/09/2024 EXAM: 1 VIEW XRAY OF THE ABDOMEN 07/09/2024 05:08:37 AM COMPARISON: None available. CLINICAL HISTORY: Evaluate enteric tube placement. FINDINGS: LINES, TUBES AND DEVICES: Enteric tube in place with tip and side port projecting over the distal stomach. BOWEL: Partially imaged upper abdomen shows gaseous dilation of small bowel loops. SOFT TISSUES: No opaque urinary calculi. BONES: No acute osseous abnormality. IMPRESSION: 1. Enteric tube is in place with tip and side port over the distal stomach. 2. Gaseous dilation of small bowel loops in the partially imaged upper abdomen. Electronically signed by: Waddell Calk MD 07/09/2024 06:31 AM EDT RP Workstation: [id number]   CT Angio Abd/Pel W and/or Wo Contrast Result Date: 07/08/2024 CLINICAL DATA:  Lower gastrointestinal bleeding, dark stool since this morning, nausea EXAM: CTA ABDOMEN AND PELVIS WITHOUT AND WITH CONTRAST TECHNIQUE: Multidetector CT imaging of the abdomen and pelvis was performed using the standard protocol during bolus administration of intravenous contrast. Multiplanar reconstructed images and MIPs were obtained and reviewed to evaluate the vascular  anatomy. RADIATION DOSE REDUCTION: This exam was performed according to the departmental dose-optimization program which includes automated exposure control, adjustment of the mA and/or kV according to patient size and/or use of iterative reconstruction technique. CONTRAST:  80mL OMNIPAQUE  IOHEXOL  350 MG/ML SOLN COMPARISON:  03/10/2024 FINDINGS: VASCULAR Aorta: Normal caliber aorta without aneurysm, dissection, vasculitis or significant stenosis. Celiac: Patent without evidence of aneurysm, dissection, vasculitis or significant stenosis. SMA: Patent without evidence of aneurysm, dissection, vasculitis or significant stenosis. Renals: Both renal arteries are patent without evidence of aneurysm, dissection, vasculitis, fibromuscular dysplasia or significant stenosis. IMA: Patent without evidence of aneurysm, dissection, vasculitis or significant stenosis. Inflow: Patent without evidence of aneurysm, dissection, vasculitis or significant stenosis. Proximal Outflow: Bilateral common femoral and visualized portions of the superficial and profunda femoral arteries are patent without evidence of aneurysm, dissection, vasculitis or significant stenosis. Veins: No acute venous abnormalities. Review of the MIP images confirms the above findings. NON-VASCULAR Lower chest: No acute pleural or parenchymal lung disease. Fluid-filled distal thoracic esophagus may reflect sequela of reflux. Small pericardial effusion. Hepatobiliary: Simple right lobe liver cyst is not require follow-up. Remainder of the liver is unremarkable. The gallbladder is moderately distended, with gallbladder wall thickening and pericholecystic fluid concerning for acute cholecystitis. No calcified gallstones. No biliary duct dilation. Pancreas: Unremarkable. No pancreatic ductal dilatation or surrounding inflammatory changes. Spleen: Normal in size without focal abnormality. Adrenals/Urinary Tract: Adrenal glands are unremarkable. Kidneys  are normal,  without renal calculi, focal lesion, or hydronephrosis. Bladder is unremarkable. Stomach/Bowel: There is marked distension of the stomach and small bowel, with jejunum measuring up to 5.5 cm in diameter. Transition from dilated to nondilated small bowel within the lower pelvis at the level of the distal jejunum/ileum. Findings are consistent with small-bowel obstruction. Scattered gas fluid levels within the: Compatible with diarrhea or recent laxative/enema use. There is no intraluminal accumulation of contrast to suggest active gastrointestinal bleeding. Lymphatic: No pathologic adenopathy. Reproductive: Prostate is unremarkable. Other: No free fluid or free intraperitoneal gas. No abdominal wall hernia. Musculoskeletal: No acute or destructive bony abnormalities. Reconstructed images demonstrate no additional findings. IMPRESSION: VASCULAR 1. No evidence of active gastrointestinal bleeding. 2. No significant vascular abnormalities. NON-VASCULAR 1. Small-bowel obstruction, with transition from dilated to nondilated bowel within the distal jejunum/ileum within the lower pelvis. 2. Gallbladder wall thickening and pericholecystic fat stranding concerning for acute cholecystitis. No calcified gallstones. 3. Fluid-filled distal thoracic esophagus which may reflect sequela of reflux. 4. Scattered gas fluid levels within the colon, consistent with diarrhea or recent laxative/enema use. 5. Small pericardial effusion. Electronically Signed   By: Ozell Daring M.D.   On: 07/08/2024 18:02   CT Head Wo Contrast Result Date: 07/08/2024 EXAM: CT HEAD WITHOUT CONTRAST 07/08/2024 05:51:00 PM TECHNIQUE: CT of the head was performed without the administration of intravenous contrast. Automated exposure control, iterative reconstruction, and/or weight based adjustment of the mA/kV was utilized to reduce the radiation dose to as low as reasonably achievable. COMPARISON: 07/06/2024 CLINICAL HISTORY: Fall on blood thinners.  Possible GI bleed. FINDINGS: BRAIN AND VENTRICLES: No acute hemorrhage. No evidence of acute infarct. No hydrocephalus. No extra-axial collection. No mass effect or midline shift. Chronic microvascular ischemia and generalized atrophy. ORBITS: No acute abnormality. SINUSES: No acute abnormality. SOFT TISSUES AND SKULL: No acute soft tissue abnormality. No skull fracture. IMPRESSION: 1. No acute intracranial abnormality. Electronically signed by: Norman Gatlin MD 07/08/2024 05:57 PM EDT RP Workstation: [id number]    Medications / Allergies: per chart  Antibiotics: Anti-infectives (From admission, onward)    Start     Dose/Rate Route Frequency Ordered Stop   07/09/24 0600  piperacillin-tazobactam (ZOSYN) IVPB 3.375 g       Placed in Followed by Linked Group   3.375 g 12.5 mL/hr over 240 Minutes Intravenous Every 8 hours 07/08/24 2033     07/08/24 2045  piperacillin-tazobactam (ZOSYN) IVPB 3.375 g       Placed in Followed by Linked Group   3.375 g 100 mL/hr over 30 Minutes Intravenous  Once 07/08/24 2033 07/08/24 2146         Note: Portions of this report may have been transcribed using voice recognition software. Every effort was made to ensure accuracy; however, inadvertent computerized transcription errors may be present.   Any transcriptional errors that result from this process are unintentional.    Elspeth KYM Schultze, MD, FACS, MASCRS Esophageal, Gastrointestinal & Colorectal Surgery Robotic and Minimally Invasive Surgery  [facility] [address] [place], [place] [address] [phone] Fax [phone] Main  CONTACT INFORMATION: Weekday (9AM-5PM): Call [facility] main office at [phone] Weeknight (5PM-9AM) or Weekend/Holiday: Check EPIC Web Links tab & use AMION (password  [email or web address]) for General Surgery [facility] coverage  Please, DO NOT use SecureChat  (it is not reliable communication to reach operating  surgeons & will lead to a delay in care).   Epic staff messaging available for  outpatient concerns needing 1-2 business day response.      07/09/2024  8:19 AM

## 2024-07-09 NOTE — ED Notes (Signed)
 Pt cleaned, placed in new brief, linen change, and gown change. Pt continues to have constant watery black stools.

## 2024-07-10 ENCOUNTER — Inpatient Hospital Stay (HOSPITAL_COMMUNITY): Admitting: Anesthesiology

## 2024-07-10 ENCOUNTER — Inpatient Hospital Stay (HOSPITAL_COMMUNITY)

## 2024-07-10 ENCOUNTER — Encounter (HOSPITAL_COMMUNITY)
Admission: EM | Disposition: A | Discharge status: Skilled Nursing Facility | Payer: Self-pay | Source: Skilled Nursing Facility | Attending: Family Medicine

## 2024-07-10 DIAGNOSIS — K209 Esophagitis, unspecified without bleeding: Secondary | ICD-10-CM

## 2024-07-10 DIAGNOSIS — G20B2 Parkinson's disease with dyskinesia, with fluctuations: Secondary | ICD-10-CM | POA: Diagnosis not present

## 2024-07-10 DIAGNOSIS — I2699 Other pulmonary embolism without acute cor pulmonale: Secondary | ICD-10-CM | POA: Diagnosis not present

## 2024-07-10 DIAGNOSIS — Z87891 Personal history of nicotine dependence: Secondary | ICD-10-CM | POA: Diagnosis not present

## 2024-07-10 DIAGNOSIS — I10 Essential (primary) hypertension: Secondary | ICD-10-CM

## 2024-07-10 DIAGNOSIS — K56609 Unspecified intestinal obstruction, unspecified as to partial versus complete obstruction: Secondary | ICD-10-CM | POA: Diagnosis not present

## 2024-07-10 DIAGNOSIS — Z7901 Long term (current) use of anticoagulants: Secondary | ICD-10-CM | POA: Diagnosis not present

## 2024-07-10 HISTORY — PX: ESOPHAGOGASTRODUODENOSCOPY: SHX5428

## 2024-07-10 LAB — CBC
HCT: 22.2 % — ABNORMAL LOW (ref 39.0–52.0)
HCT: 22.1 % — ABNORMAL LOW (ref 39.0–52.0)
HCT: 23.3 % — ABNORMAL LOW (ref 39.0–52.0)
Hemoglobin: 6.8 g/dL — CL (ref 13.0–17.0)
Hemoglobin: 6.8 g/dL — CL (ref 13.0–17.0)
Hemoglobin: 7.4 g/dL — ABNORMAL LOW (ref 13.0–17.0)
MCH: 25.6 pg — ABNORMAL LOW (ref 26.0–34.0)
MCH: 25.6 pg — ABNORMAL LOW (ref 26.0–34.0)
MCH: 26.1 pg (ref 26.0–34.0)
MCHC: 30.6 g/dL (ref 30.0–36.0)
MCHC: 30.8 g/dL (ref 30.0–36.0)
MCHC: 31.8 g/dL (ref 30.0–36.0)
MCV: 83.5 fL (ref 80.0–100.0)
MCV: 83.1 fL (ref 80.0–100.0)
MCV: 82.3 fL (ref 80.0–100.0)
Platelets: 289 K/uL (ref 150–400)
Platelets: 311 K/uL (ref 150–400)
Platelets: 275 K/uL (ref 150–400)
RBC: 2.66 MIL/uL — ABNORMAL LOW (ref 4.22–5.81)
RBC: 2.66 MIL/uL — ABNORMAL LOW (ref 4.22–5.81)
RBC: 2.83 MIL/uL — ABNORMAL LOW (ref 4.22–5.81)
RDW: 16.3 % — ABNORMAL HIGH (ref 11.5–15.5)
RDW: 16.4 % — ABNORMAL HIGH (ref 11.5–15.5)
RDW: 15.9 % — ABNORMAL HIGH (ref 11.5–15.5)
WBC: 6.1 K/uL (ref 4.0–10.5)
WBC: 6.8 K/uL (ref 4.0–10.5)
WBC: 6.4 K/uL (ref 4.0–10.5)
nRBC: 0 % (ref 0.0–0.2)
nRBC: 0 % (ref 0.0–0.2)
nRBC: 0 % (ref 0.0–0.2)

## 2024-07-10 LAB — BASIC METABOLIC PANEL WITH GFR
Anion gap: 11 (ref 5–15)
BUN: 43 mg/dL — ABNORMAL HIGH (ref 8–23)
CO2: 20 mmol/L — ABNORMAL LOW (ref 22–32)
Calcium: 8.4 mg/dL — ABNORMAL LOW (ref 8.9–10.3)
Chloride: 110 mmol/L (ref 98–111)
Creatinine, Ser: 1.17 mg/dL (ref 0.61–1.24)
GFR, Estimated: >60 mL/min (ref >60)
Glucose, Bld: 105 mg/dL — ABNORMAL HIGH (ref 70–99)
Potassium: 3 mmol/L — ABNORMAL LOW (ref 3.5–5.1)
Sodium: 141 mmol/L (ref 135–145)

## 2024-07-10 LAB — PREPARE RBC (CROSSMATCH)

## 2024-07-10 LAB — MAGNESIUM: Magnesium: 1.9 mg/dL (ref 1.7–2.4)

## 2024-07-10 LAB — PREALBUMIN: Prealbumin: 8 mg/dL — ABNORMAL LOW (ref 18–38)

## 2024-07-10 LAB — PHOSPHORUS: Phosphorus: 1.3 mg/dL — ABNORMAL LOW (ref 2.5–4.6)

## 2024-07-10 SURGERY — EGD (ESOPHAGOGASTRODUODENOSCOPY)
Anesthesia: General

## 2024-07-10 MED ORDER — FENTANYL CITRATE (PF) 100 MCG/2ML IJ SOLN
INTRAMUSCULAR | Status: AC
  Filled 2024-07-10: qty 2

## 2024-07-10 MED ORDER — DEXAMETHASONE SOD PHOSPHATE PF 10 MG/ML IJ SOLN
INTRAMUSCULAR | Status: DC | PRN
  Administered 2024-07-10: 4 mg via INTRAVENOUS

## 2024-07-10 MED ORDER — LIDOCAINE 2% (20 MG/ML) 5 ML SYRINGE
INTRAMUSCULAR | Status: DC | PRN
  Administered 2024-07-10: 60 mg via INTRAVENOUS

## 2024-07-10 MED ORDER — FENTANYL CITRATE (PF) 100 MCG/2ML IJ SOLN: 25.0000 ug | INTRAMUSCULAR | Status: DC | PRN

## 2024-07-10 MED ORDER — SODIUM CHLORIDE 0.9% IV SOLUTION: Freq: Once | INTRAVENOUS | Status: AC

## 2024-07-10 MED ORDER — POTASSIUM CHLORIDE 10 MEQ/100ML IV SOLN
10.0000 meq | INTRAVENOUS | Status: AC
  Administered 2024-07-10 (×5): 10 meq via INTRAVENOUS
  Filled 2024-07-10: qty 100

## 2024-07-10 MED ORDER — PROPOFOL 10 MG/ML IV BOLUS
INTRAVENOUS | Status: DC | PRN
Start: 2024-07-10 — End: 2024-07-10
  Administered 2024-07-10: 50 mg via INTRAVENOUS
  Administered 2024-07-10: 30 mg via INTRAVENOUS

## 2024-07-10 MED ORDER — OXYCODONE HCL 5 MG/5ML PO SOLN
5.0000 mg | Freq: Once | ORAL | Status: DC | PRN
  Filled 2024-07-10: qty 5

## 2024-07-10 MED ORDER — OXYCODONE HCL 5 MG PO TABS
5.0000 mg | ORAL_TABLET | Freq: Once | ORAL | Status: DC | PRN
  Filled 2024-07-10: qty 1

## 2024-07-10 MED ORDER — PHENYLEPHRINE 80 MCG/ML (10ML) SYRINGE FOR IV PUSH (FOR BLOOD PRESSURE SUPPORT)
PREFILLED_SYRINGE | INTRAVENOUS | Status: DC | PRN
  Administered 2024-07-10 (×2): 80 ug via INTRAVENOUS
  Administered 2024-07-10 (×2): 160 ug via INTRAVENOUS

## 2024-07-10 MED ORDER — POTASSIUM PHOSPHATES 15 MMOLE/5ML IV SOLN
30.0000 mmol | Freq: Once | INTRAVENOUS | Status: AC
  Administered 2024-07-10: 30 mmol via INTRAVENOUS
  Filled 2024-07-10: qty 10

## 2024-07-10 MED ORDER — AMISULPRIDE (ANTIEMETIC) 5 MG/2ML IV SOLN: 10.0000 mg | Freq: Once | INTRAVENOUS | Status: DC | PRN

## 2024-07-10 MED ORDER — SODIUM CHLORIDE 0.9 % IV SOLN: INTRAVENOUS | Status: DC

## 2024-07-10 MED ORDER — SUCCINYLCHOLINE CHLORIDE 200 MG/10ML IV SOSY
PREFILLED_SYRINGE | INTRAVENOUS | Status: DC | PRN
  Administered 2024-07-10: 100 mg via INTRAVENOUS

## 2024-07-10 MED ORDER — ACETAMINOPHEN 10 MG/ML IV SOLN
1000.0000 mg | Freq: Once | INTRAVENOUS | Status: DC | PRN
  Filled 2024-07-10: qty 100

## 2024-07-10 MED ORDER — FENTANYL CITRATE (PF) 100 MCG/2ML IJ SOLN
INTRAMUSCULAR | Status: DC | PRN
  Administered 2024-07-10: 100 ug via INTRAVENOUS

## 2024-07-10 MED ORDER — ONDANSETRON HCL 4 MG/2ML IJ SOLN
INTRAMUSCULAR | Status: DC | PRN
  Administered 2024-07-10: 4 mg via INTRAVENOUS

## 2024-07-10 NOTE — Progress Notes (Signed)
 Subjective/Chief Complaint: Poor historian.  No recorded bowel function.  Patient cannot tell me the fact asked. 1250 recorded NG output  Objective: Vital signs in last 24 hours: Temp:  [97.4 F (36.3 C)-98.9 F (37.2 C)] 98.7 F (37.1 C) (10/25 0859) Pulse Rate:  [108-121] 108 (10/25 0859) Resp:  [16-20] 16 (10/25 0859) BP: (132-162)/(77-95) 132/95 (10/25 0859) SpO2:  [98 %-100 %] 100 % (10/25 0859) Weight:  [106.3 kg] 106.3 kg (10/25 0405) Last BM Date : 07/09/24  Intake/Output from previous day: 10/24 0701 - 10/25 0700 In: 60 [P.O.:60] Out: 2500 [Urine:1250; Emesis/NG output:1250] Intake/Output this shift: No intake/output data recorded.  Abdomen: Soft nontender minimal distention without rebound or guarding.  Lab Results:  Recent Labs    07/10/24 0552 07/10/24 0855  WBC 6.1 6.8  HGB 6.8* 6.8*  HCT 22.2* 22.1*  PLT 289 311   BMET Recent Labs    07/09/24 1444 07/10/24 0552  NA 142 141  K 3.0* 3.0*  CL 113* 110  CO2 20* 20*  GLUCOSE 118* 105*  BUN 58* 43*  CREATININE 1.38* 1.17  CALCIUM  8.1* 8.4*   PT/INR Recent Labs    07/08/24 1608  LABPROT 21.6*  INR 1.8*   ABG No results for input(s): PHART, HCO3 in the last 72 hours.  Invalid input(s): PCO2, PO2  Studies/Results: ECHOCARDIOGRAM COMPLETE Result Date: 07/09/2024    ECHOCARDIOGRAM REPORT   Patient Name:   John [name] Date of Exam: 07/09/2024 Medical Rec #:  [id number]        Height:       74.0 in Accession #:    [id number]       Weight:       227.3 lb Date of Birth:  Oct 18, 1960       BSA:          2.295 m Patient Age:    62 years         BP:           138/80 mmHg Patient Gender: M                HR:           118 bpm. Exam Location:  Inpatient Procedure: 2D Echo, Cardiac Doppler and Color Doppler (Both Spectral and Color            Flow Doppler were utilized during procedure). Indications:    Pericardia Effusion I31.3, Syncope R55  History:        Patient has prior history of  Echocardiogram examinations, most                 recent 03/11/2024. Signs/Symptoms:Syncope; Risk                 Factors:Hypertension.  Sonographer:    BERNARDA ROCKS Referring Phys: [id number] TIMOTHY S OPYD IMPRESSIONS  1. Left ventricular ejection fraction, by estimation, is >75%. The left ventricle has hyperdynamic function. The left ventricle has no regional wall motion abnormalities. Left ventricular diastolic parameters are consistent with Grade I diastolic dysfunction (impaired relaxation).  2. Right ventricular systolic function is normal. The right ventricular size is normal.  3. A small pericardial effusion is present. The pericardial effusion is circumferential. There is no evidence of cardiac tamponade.  4. The mitral valve is normal in structure. No evidence of mitral valve regurgitation. No evidence of mitral stenosis.  5. The aortic valve is tricuspid. There is mild calcification of the aortic valve. Aortic valve regurgitation is not  visualized. Aortic valve sclerosis/calcification is present, without any evidence of aortic stenosis.  6. The inferior vena cava is normal in size with greater than 50% respiratory variability, suggesting right atrial pressure of 3 mmHg. Conclusion(s)/Recommendation(s): Technically limited study with poor acoustic images. FINDINGS  Left Ventricle: Left ventricular ejection fraction, by estimation, is >75%. The left ventricle has hyperdynamic function. The left ventricle has no regional wall motion abnormalities. The left ventricular internal cavity size was normal in size. There is no left ventricular hypertrophy. Left ventricular diastolic parameters are consistent with Grade I diastolic dysfunction (impaired relaxation). Right Ventricle: The right ventricular size is normal. No increase in right ventricular wall thickness. Right ventricular systolic function is normal. Left Atrium: Left atrial size was normal in size. Right Atrium: Right atrial size was normal in size.  Pericardium: A small pericardial effusion is present. The pericardial effusion is circumferential. There is no evidence of cardiac tamponade. Thickening/calcification of pericardium present. Mitral Valve: The mitral valve is normal in structure. No evidence of mitral valve regurgitation. No evidence of mitral valve stenosis. MV peak gradient, 4.2 mmHg. The mean mitral valve gradient is 2.0 mmHg. Tricuspid Valve: The tricuspid valve is normal in structure. Tricuspid valve regurgitation is trivial. No evidence of tricuspid stenosis. Aortic Valve: The aortic valve is tricuspid. There is mild calcification of the aortic valve. Aortic valve regurgitation is not visualized. Aortic valve sclerosis/calcification is present, without any evidence of aortic stenosis. Aortic valve mean gradient measures 3.0 mmHg. Aortic valve peak gradient measures 7.3 mmHg. Aortic valve area, by VTI measures 3.44 cm. Pulmonic Valve: The pulmonic valve was not well visualized. Pulmonic valve regurgitation is not visualized. No evidence of pulmonic stenosis. Aorta: The aortic root is normal in size and structure. Venous: The inferior vena cava is normal in size with greater than 50% respiratory variability, suggesting right atrial pressure of 3 mmHg. IAS/Shunts: No atrial level shunt detected by color flow Doppler.  LEFT VENTRICLE PLAX 2D LVIDd:         4.40 cm LVIDs:         2.80 cm LV PW:         1.00 cm LV IVS:        1.00 cm LVOT diam:     2.30 cm LV SV:         62 LV SV Index:   27 LVOT Area:     4.15 cm  LV Volumes (MOD) LV vol d, MOD A2C: 86.1 ml LV vol d, MOD A4C: 144.0 ml LV vol s, MOD A2C: 27.8 ml LV vol s, MOD A4C: 44.7 ml LV SV MOD A2C:     58.3 ml LV SV MOD A4C:     144.0 ml LV SV MOD BP:      75.6 ml RIGHT VENTRICLE             IVC RV Basal diam:  3.50 cm     IVC diam: 1.20 cm RV S prime:     36.90 cm/s TAPSE (M-mode): 2.4 cm LEFT ATRIUM             Index        RIGHT ATRIUM           Index LA diam:        3.00 cm 1.31 cm/m   RA  Area:     11.30 cm LA Vol (A2C):   38.5 ml 16.78 ml/m  RA Volume:   23.70 ml  10.33 ml/m LA Vol (A4C):  31.4 ml 13.68 ml/m LA Biplane Vol: 36.2 ml 15.77 ml/m  AORTIC VALVE                    PULMONIC VALVE AV Area (Vmax):    3.45 cm     PV Vmax:       0.97 m/s AV Area (Vmean):   3.30 cm     PV Peak grad:  3.7 mmHg AV Area (VTI):     3.44 cm AV Vmax:           135.00 cm/s AV Vmean:          80.900 cm/s AV VTI:            0.180 m AV Peak Grad:      7.3 mmHg AV Mean Grad:      3.0 mmHg LVOT Vmax:         112.00 cm/s LVOT Vmean:        64.200 cm/s LVOT VTI:          0.149 m LVOT/AV VTI ratio: 0.83  AORTA Ao Root diam: 3.30 cm Ao Asc diam:  3.30 cm MITRAL VALVE MV Area (PHT): 6.77 cm     SHUNTS MV Area VTI:   4.07 cm     Systemic VTI:  0.15 m MV Peak grad:  4.2 mmHg     Systemic Diam: 2.30 cm MV Mean grad:  2.0 mmHg MV Vmax:       1.02 m/s MV Vmean:      70.9 cm/s MV Decel Time: 112 msec MV E velocity: 65.90 cm/s MV A velocity: 107.00 cm/s MV E/A ratio:  0.62 Toribio Fuel MD Electronically signed by Toribio Fuel MD Signature Date/Time: 07/09/2024/8:28:38 PM    Final    DG Abd Portable 1V-Small Bowel Obstruction Protocol-initial, 8 hr delay Result Date: 07/09/2024 EXAM: 1 VIEW XRAY OF THE ABDOMEN 07/09/2024 07:10:00 PM COMPARISON: None available. CLINICAL HISTORY: Small bowel obstruction- 8 hr delay; Best images obtainable d/t pt condition. FINDINGS: LINES, TUBES AND DEVICES: Enter tube tip is at the level of the gastric antrum, unchanged. BOWEL: There are numerous dilated small bowel loops measuring up to 6.8 cm. No contrast seen within small bowel or colon. Minimal colonic gas present. SOFT TISSUES: No opaque urinary calculi. BONES: No acute osseous abnormality. IMPRESSION: 1. Small bowel obstruction with numerous dilated loops measuring up to 6.8 cm. 2. No contrast seen within small bowel or colon. Minimal colonic gas present. Electronically signed by: Greig Pique MD 07/09/2024 08:24 PM EDT RP  Workstation: [id number]   DG Abdomen 1 View Result Date: 07/09/2024 EXAM: 1 VIEW XRAY OF THE ABDOMEN 07/09/2024 05:08:37 AM COMPARISON: None available. CLINICAL HISTORY: Evaluate enteric tube placement. FINDINGS: LINES, TUBES AND DEVICES: Enteric tube in place with tip and side port projecting over the distal stomach. BOWEL: Partially imaged upper abdomen shows gaseous dilation of small bowel loops. SOFT TISSUES: No opaque urinary calculi. BONES: No acute osseous abnormality. IMPRESSION: 1. Enteric tube is in place with tip and side port over the distal stomach. 2. Gaseous dilation of small bowel loops in the partially imaged upper abdomen. Electronically signed by: Waddell Calk MD 07/09/2024 06:31 AM EDT RP Workstation: [id number]   CT Angio Abd/Pel W and/or Wo Contrast Result Date: 07/08/2024 CLINICAL DATA:  Lower gastrointestinal bleeding, dark stool since this morning, nausea EXAM: CTA ABDOMEN AND PELVIS WITHOUT AND WITH CONTRAST TECHNIQUE: Multidetector CT imaging of the abdomen and pelvis was performed using the standard protocol during  bolus administration of intravenous contrast. Multiplanar reconstructed images and MIPs were obtained and reviewed to evaluate the vascular anatomy. RADIATION DOSE REDUCTION: This exam was performed according to the departmental dose-optimization program which includes automated exposure control, adjustment of the mA and/or kV according to patient size and/or use of iterative reconstruction technique. CONTRAST:  80mL OMNIPAQUE  IOHEXOL  350 MG/ML SOLN COMPARISON:  03/10/2024 FINDINGS: VASCULAR Aorta: Normal caliber aorta without aneurysm, dissection, vasculitis or significant stenosis. Celiac: Patent without evidence of aneurysm, dissection, vasculitis or significant stenosis. SMA: Patent without evidence of aneurysm, dissection, vasculitis or significant stenosis. Renals: Both renal arteries are patent without evidence of aneurysm, dissection, vasculitis, fibromuscular  dysplasia or significant stenosis. IMA: Patent without evidence of aneurysm, dissection, vasculitis or significant stenosis. Inflow: Patent without evidence of aneurysm, dissection, vasculitis or significant stenosis. Proximal Outflow: Bilateral common femoral and visualized portions of the superficial and profunda femoral arteries are patent without evidence of aneurysm, dissection, vasculitis or significant stenosis. Veins: No acute venous abnormalities. Review of the MIP images confirms the above findings. NON-VASCULAR Lower chest: No acute pleural or parenchymal lung disease. Fluid-filled distal thoracic esophagus may reflect sequela of reflux. Small pericardial effusion. Hepatobiliary: Simple right lobe liver cyst is not require follow-up. Remainder of the liver is unremarkable. The gallbladder is moderately distended, with gallbladder wall thickening and pericholecystic fluid concerning for acute cholecystitis. No calcified gallstones. No biliary duct dilation. Pancreas: Unremarkable. No pancreatic ductal dilatation or surrounding inflammatory changes. Spleen: Normal in size without focal abnormality. Adrenals/Urinary Tract: Adrenal glands are unremarkable. Kidneys are normal, without renal calculi, focal lesion, or hydronephrosis. Bladder is unremarkable. Stomach/Bowel: There is marked distension of the stomach and small bowel, with jejunum measuring up to 5.5 cm in diameter. Transition from dilated to nondilated small bowel within the lower pelvis at the level of the distal jejunum/ileum. Findings are consistent with small-bowel obstruction. Scattered gas fluid levels within the: Compatible with diarrhea or recent laxative/enema use. There is no intraluminal accumulation of contrast to suggest active gastrointestinal bleeding. Lymphatic: No pathologic adenopathy. Reproductive: Prostate is unremarkable. Other: No free fluid or free intraperitoneal gas. No abdominal wall hernia. Musculoskeletal: No acute or  destructive bony abnormalities. Reconstructed images demonstrate no additional findings. IMPRESSION: VASCULAR 1. No evidence of active gastrointestinal bleeding. 2. No significant vascular abnormalities. NON-VASCULAR 1. Small-bowel obstruction, with transition from dilated to nondilated bowel within the distal jejunum/ileum within the lower pelvis. 2. Gallbladder wall thickening and pericholecystic fat stranding concerning for acute cholecystitis. No calcified gallstones. 3. Fluid-filled distal thoracic esophagus which may reflect sequela of reflux. 4. Scattered gas fluid levels within the colon, consistent with diarrhea or recent laxative/enema use. 5. Small pericardial effusion. Electronically Signed   By: Ozell Daring M.D.   On: 07/08/2024 18:02   CT Head Wo Contrast Result Date: 07/08/2024 EXAM: CT HEAD WITHOUT CONTRAST 07/08/2024 05:51:00 PM TECHNIQUE: CT of the head was performed without the administration of intravenous contrast. Automated exposure control, iterative reconstruction, and/or weight based adjustment of the mA/kV was utilized to reduce the radiation dose to as low as reasonably achievable. COMPARISON: 07/06/2024 CLINICAL HISTORY: Fall on blood thinners. Possible GI bleed. FINDINGS: BRAIN AND VENTRICLES: No acute hemorrhage. No evidence of acute infarct. No hydrocephalus. No extra-axial collection. No mass effect or midline shift. Chronic microvascular ischemia and generalized atrophy. ORBITS: No acute abnormality. SINUSES: No acute abnormality. SOFT TISSUES AND SKULL: No acute soft tissue abnormality. No skull fracture. IMPRESSION: 1. No acute intracranial abnormality. Electronically signed by: Norman Gatlin MD 07/08/2024 05:57  PM EDT RP Workstation: [id number]    Anti-infectives: Anti-infectives (From admission, onward)    Start     Dose/Rate Route Frequency Ordered Stop   07/09/24 0600  piperacillin-tazobactam (ZOSYN) IVPB 3.375 g  Status:  Discontinued       Placed in Followed  by Linked Group   3.375 g 12.5 mL/hr over 240 Minutes Intravenous Every 8 hours 07/08/24 2033 07/09/24 0825   07/08/24 2045  piperacillin-tazobactam (ZOSYN) IVPB 3.375 g       Placed in Followed by Linked Group   3.375 g 100 mL/hr over 30 Minutes Intravenous  Once 07/08/24 2033 07/08/24 2146       Assessment/Plan: Small bowel obstruction-unclear about bowel function.  Will re check KUB.  Nontender abdomen which is reassuring.  No rebound or guarding.  No acute surgical indication this point in time we will continue to follow  Melena-being followed by GI medicine.  No acute change.  Continue NG tube n.p.o. for now.   LOS: 2 days    Debby DELENA Shipper MD 07/10/2024 Moderate complexity

## 2024-07-10 NOTE — Transfer of Care (Signed)
 Immediate Anesthesia Transfer of Care Note  Patient: John [name]  Procedure(s) Performed: EGD (ESOPHAGOGASTRODUODENOSCOPY)  Patient Location: PACU  Anesthesia Type:General  Level of Consciousness: drowsy  Airway & Oxygen Therapy: Patient Spontanous Breathing  Post-op Assessment: Report given to RN and Post -op Vital signs reviewed and stable  Post vital signs: Reviewed and stable  Last Vitals:  Vitals Value Taken Time  BP 128/77 07/10/24 16:33  Temp    Pulse 96 07/10/24 16:36  Resp 16 07/10/24 16:36  SpO2 96 % 07/10/24 16:36  Vitals shown include unfiled device data.  Last Pain:  Vitals:   07/10/24 1103  TempSrc: Axillary  PainSc:       Patients Stated Pain Goal: 4 (07/09/24 0835)  Complications: No notable events documented.

## 2024-07-10 NOTE — Anesthesia Procedure Notes (Signed)
 Procedure Name: Intubation Date/Time: 07/10/2024 3:58 PM  Performed by: Loreli Blima LABOR, CRNAPre-anesthesia Checklist: Patient identified, Emergency Drugs available, Suction available and Patient being monitored Patient Re-evaluated:Patient Re-evaluated prior to induction Oxygen Delivery Method: Circle System Utilized Preoxygenation: Pre-oxygenation with 100% oxygen Induction Type: Rapid sequence and IV induction Laryngoscope Size: Glidescope and 4 Grade View: Grade I Tube type: Oral Tube size: 7.5 mm Number of attempts: 1 Airway Equipment and Method: Rigid stylet Placement Confirmation: ETT inserted through vocal cords under direct vision, positive ETCO2 and breath sounds checked- equal and bilateral Tube secured with: Tape Dental Injury: Teeth and Oropharynx as per pre-operative assessment  Difficulty Due To: Difficulty was anticipated

## 2024-07-10 NOTE — Progress Notes (Signed)
 Patient scoring Yellow MEWS due to BP and HR; grossly unchanged from previous status.  Discussed at bedside with Drs. Lama and Danis.  Patient has received multiple runs of IV KCl and currently finishing 1 unit PRBC.  Alert, oriented to self, place and situation, not time (baseline) with advanced Parkinsonism.  New orders have been received previously and currently; interventions underway and will prep patient for upper endoscopy this PM.  Continuing to monitor closely.   07/10/24 1103  Assess: MEWS Score  Temp 98.5 F (36.9 C)  BP (!) 154/84  MAP (mmHg) 103  Pulse Rate (!) 105  ECG Heart Rate (!) 105  Resp (!) 24  SpO2 100 %  O2 Device Room Air  Patient Activity (if Appropriate) In bed  Assess: MEWS Score  MEWS Temp 0  MEWS Systolic 0  MEWS Pulse 1  MEWS RR 1  MEWS LOC 0  MEWS Score 2  MEWS Score Color Yellow  Assess: SIRS CRITERIA  SIRS Temperature  0  SIRS Respirations  1  SIRS Pulse 1  SIRS WBC 0  SIRS Score Sum  2

## 2024-07-10 NOTE — Progress Notes (Signed)
 Notified by [facility] physician and nursing that this patient passed a large melenic stool last evening with a drop in hemoglobin.  He has been transfused 1 unit PRBCs, and has also received multiple rounds of potassium chloride  for a level of 3.0 this morning.  He still has large NG tube output consistent with his bowel obstruction,  Abdomen softly mildly distended but tympanitic with scant bowel sounds.  His GI bleeding has increased since admission, and now he needs an upper endoscopy sooner than originally planned.  I am making every effort to get this done later today, though OR/anesthesia services have been limited today due to some operational issues.  We will contact anesthesia and arrange things best we can with the available schedule.  He will need general anesthesia for airway protection due to his SBO.  I had his nurse Lonni try to reach Mr. Justen son to give them an update and obtain procedural consent because Mr. Sambrano remains somnolent today.  Voicemail was left.  In my opinion, this patient cannot give good informed consent for a procedure.  If we do not hear from his son in the next couple of hours we will try to reach siblings that are listed as his contacts.   GLENWOOD Victory Brand, MD    [facility]

## 2024-07-10 NOTE — Progress Notes (Signed)
 Received radiology report by phone; nasogastric tube insertion at level of esophagogastric junction with recommendation to advance tube 5cm.  Tube inserted in right nare at marking 60cm.  Removed tape and advanced to 65cm.  Retaped in place and re-initated low wall suction at .

## 2024-07-10 NOTE — Op Note (Signed)
 [facility] Patient Name: John [name] Procedure Date : 07/10/2024 MRN: [id number] Attending MD: Victory CROME. Legrand , MD, [id number] Date of Birth: 05-Oct-1960 CSN: [id number] Age: 63 Admit Type: Inpatient Procedure:                Upper GI endoscopy Indications:              Acute post hemorrhagic anemia, Melena Providers:                Victory L. Legrand, MD, Darleene Bare, RN, Felice Sar,                            Technician Referring MD:             [facility] Medicines:                Monitored Anesthesia Care Complications:            No immediate complications. Estimated Blood Loss:     Estimated blood loss: none. Procedure:                Pre-Anesthesia Assessment:                           - Prior to the procedure, a History and Physical                            was performed, and patient medications and                            allergies were reviewed. The patient's tolerance of                            previous anesthesia was also reviewed. The risks                            and benefits of the procedure and the sedation                            options and risks were discussed with the patient.                            All questions were answered, and informed consent                            was obtained. Prior Anticoagulants: The patient has                            taken no anticoagulant or antiplatelet agents. ASA                            Grade Assessment: IV - A patient with severe                            systemic disease that is a constant threat to life.  After reviewing the risks and benefits, the patient                            was deemed in satisfactory condition to undergo the                            procedure.                           After obtaining informed consent, the endoscope was                            passed under direct vision. Throughout the                            procedure, the  patient's blood pressure, pulse, and                            oxygen saturations were monitored continuously. The                            [id number]-H190 ([id number]) Olympus endoscope was introduced                            through the mouth, and advanced to the second part                            of duodenum. The upper GI endoscopy was                            accomplished without difficulty. The patient                            tolerated the procedure well. Scope In: Scope Out: Findings:      LA Grade D (one or more mucosal breaks involving at least 75% of       esophageal circumference) esophagitis with no bleeding was found 25 to       38 cm from the incisors. Distal esophageal tissue somewhat friable       adjacent to ulcers.Scattered flat red spots.      A small hiatal hernia was present.      The stomach was normal except mild NGT mucosal suction traume on the       greater curve.      The cardia and gastric fundus were normal on retroflexion. Stomach       distended well with insufflation, and there was no retained fluid, fresh       or old blood in the upper GI tract.      The examined duodenum was normal.      NGT was removed after intubation, and a new 16Fr NGT was placed via the       right nostril. Passage into the esophagus was confirmed endoscopically,       and then the endoscopy removed and the NGT advanced to 60 cm from the       nostril and taped in position. Impression:               -  LA Grade D reflux esophagitis with no bleeding.                           - Small hiatal hernia.                           - Normal stomach.                           - Normal examined duodenum.                           - No specimens collected. Recommendation:           - Return patient to hospital ward for ongoing care.                           - NPO b/c bowel obsruction still present on exam                            and today's KUB.                           (however,  the fact that there was blood passage per                            rectum and oral contrast in the right colon in                            today's KUB suggests SBO may be slowly improving)                           Portable KUB ordered to check new NGT position.                            Nursing to adjust tube as needed for optimal                            position based on imaging.                           Put NGT back to low intermittent wall suction AFTER                            KUB confirms good NGT placement.                           Pantoprazole  40 mg IV BID                           Keep Head of bed at least 30 degrees at all times.                            This is essential.  If further melena or BRBPR with drop in Hgb, obtain                            stat CTA abdomen.                           Sister updated by phone Procedure Code(s):        --- Professional ---                           [phone], Esophagogastroduodenoscopy, flexible,                            transoral; diagnostic, including collection of                            specimen(s) by brushing or washing, when performed                            (separate procedure) Diagnosis Code(s):        --- Professional ---                           K21.00, Gastro-esophageal reflux disease with                            esophagitis, without bleeding                           K44.9, Diaphragmatic hernia without obstruction or                            gangrene                           D62, Acute posthemorrhagic anemia                           K92.1, Melena (includes Hematochezia) CPT copyright 2022 [employer]. All rights reserved. The codes documented in this report are preliminary and upon coder review may  be revised to meet current compliance requirements. Novice Vrba L. Legrand, MD 07/10/2024 4:39:35 PM This report has been signed electronically. Number of Addenda:  0

## 2024-07-10 NOTE — Anesthesia Preprocedure Evaluation (Addendum)
 Anesthesia Evaluation  Patient identified by MRN, date of birth, ID band Patient confused    Reviewed: Allergy & Precautions, NPO status , Patient's Chart, lab work & pertinent test results  Airway Mallampati: Unable to assess       Dental   Pulmonary former smoker, PE   Pulmonary exam normal        Cardiovascular hypertension, Normal cardiovascular exam Rhythm:Regular Rate:Tachycardia  ECHO: 1. Left ventricular ejection fraction, by estimation, is >75%. The left  ventricle has hyperdynamic function. The left ventricle has no regional  wall motion abnormalities. Left ventricular diastolic parameters are  consistent with Grade I diastolic  dysfunction (impaired relaxation).   2. Right ventricular systolic function is normal. The right ventricular  size is normal.   3. A small pericardial effusion is present. The pericardial effusion is  circumferential. There is no evidence of cardiac tamponade.   4. The mitral valve is normal in structure. No evidence of mitral valve  regurgitation. No evidence of mitral stenosis.   5. The aortic valve is tricuspid. There is mild calcification of the  aortic valve. Aortic valve regurgitation is not visualized. Aortic valve  sclerosis/calcification is present, without any evidence of aortic  stenosis.   6. The inferior vena cava is normal in size with greater than 50%  respiratory variability, suggesting right atrial pressure of 3 mmHg.     Neuro/Psych Seizures -,  Subdural hematoma Parkinson's disease    GI/Hepatic ,GERD  Medicated,,Bowel obstruction    Endo/Other  hypokalemia  Renal/GU      Musculoskeletal Cervical myelopathy   Abdominal  (+) + obese  Peds  Hematology  (+) Blood dyscrasia (Eliquis ), anemia INR: 1.8   Anesthesia Other Findings Hematemesis  Reproductive/Obstetrics                              Anesthesia Physical Anesthesia  Plan  ASA: 3 and emergent  Anesthesia Plan: General   Post-op Pain Management:    Induction: Intravenous and Rapid sequence  PONV Risk Score and Plan: 2 and Ondansetron , Dexamethasone , Midazolam  and Treatment may vary due to age or medical condition  Airway Management Planned: Oral ETT  Additional Equipment:   Intra-op Plan:   Post-operative Plan: Extubation in OR  Informed Consent: I have reviewed the patients History and Physical, chart, labs and discussed the procedure including the risks, benefits and alternatives for the proposed anesthesia with the patient or authorized representative who has indicated his/her understanding and acceptance.     Consent reviewed with POA  Plan Discussed with: CRNA  Anesthesia Plan Comments:          Anesthesia Quick Evaluation

## 2024-07-10 NOTE — Plan of Care (Signed)

## 2024-07-10 NOTE — Progress Notes (Signed)
 [facility]  PROGRESS NOTE  John [name] FMW:[id number] DOB: [birth year] DOA: 07/08/2024 PCP: [name], MD   Brief HPI:    63 y.o. male with medical history significant for Parkinson disease, mild cognitive impairment, PE in June 2025 on Eliquis , and BPH who presents with dark tarry stool, dark emesis, and abdominal pain.   Patient reports that he developed abdominal pain yesterday.  This has been associated with nausea, loose dark, tarry stools, and then dark emesis today.    Assessment/Plan:    Small bowel obstruction -Seen on CT abdomen/pelvis - General Surgery consulted, NG tube placed - Small bowel protocol ordered per general surgery, KUB obtained this morning shows persistent dilated loops of mid to distal small bowel measuring up to 6.5 cm, oral contrast progressed to colon and rectum. - Further management per general surgery  Dark tarry stools/melena - Gastroenterology consulted - Patient is on Eliquis  which is currently on hold - Hemoglobin is down to 6.8 today, will order 1 unit PRBC - GI is planning to perform EGD today.  Syncope - Patient had syncope upon standing from bedside commode in ED - He has history of orthostatic hypotension, also hypovolemic in setting of nausea vomiting and diarrhea and GI bleed - Continue IV hydration, transfuse PRBC as needed  Acute kidney injury - Serum creatinine 1.70 on admission, it was 1.052 days earlier - No hydronephrosis seen on CT abdomen/pelvis - Likely prerenal in setting of nausea vomiting and GI bleed as above - Started on IV hydration, creatinine has improved to 1.39  History of pulmonary embolism - Had pulmonary embolism in June 2025 - Anticoagulation on hold   Parkinson disease, mild cognitive impairment - Sinemet  on hold  Hypokalemia - Potassium was 3.0 this morning - Will replace potassium and follow BMP in am  Hypophosphatemia - Phosphorus is 1.3, will replace phosphorus and check  phosphorus level in a.m.  Pericardial effusion - Small pericardial effusion noted on CT in the ED - Echocardiogram showed EF 75%, grade 1 diastolic dysfunction; also showed small pericardial effusion        DVT prophylaxis: SCDs  Medications     bisacodyl   10 mg Rectal Daily   pantoprazole  (PROTONIX ) IV  40 mg Intravenous Q12H   sodium chloride  flush  3 mL Intravenous Q12H     Data Reviewed:   CBG:  Recent Labs  Lab 07/08/24 1702  GLUCAP 223*    SpO2: 98 %    Vitals:   07/09/24 1610 07/09/24 1956 07/09/24 2337 07/10/24 0405  BP: (!) 153/77 (!) 153/91 (!) 162/95 (!) 147/79  Pulse: (!) 113 (!) 115 (!) 110 (!) 116  Resp: 20 18 18 19   Temp: 98.9 F (37.2 C) 97.9 F (36.6 C) 98.8 F (37.1 C) (!) 97.4 F (36.3 C)  TempSrc: Axillary Oral Axillary Axillary  SpO2: 99% 98% 99% 98%  Weight:    106.3 kg  Height:          Data Reviewed:  Basic Metabolic Panel: Recent Labs  Lab 07/06/24 0741 07/08/24 1608 07/08/24 1719 07/09/24 0237 07/09/24 1444 07/10/24 0552  NA 137 137 137 140 142 141  K 3.4* 3.0* 3.0* 2.9* 3.0* 3.0*  CL 106 102 104 108 113* 110  CO2 21* 16*  --  17* 20* 20*  GLUCOSE 121* 197* 202* 135* 118* 105*  BUN 16 53* 49* 63* 58* 43*  CREATININE 1.05 1.70* 1.70* 1.39* 1.38* 1.17  CALCIUM  8.8* 8.7*  --  7.8* 8.1* 8.4*  MG  --  2.0  --   --   --  1.9  PHOS  --   --   --   --   --  1.3*    CBC: Recent Labs  Lab 07/06/24 0741 07/08/24 1608 07/08/24 1719 07/09/24 0237 07/09/24 0755 07/09/24 1444 07/09/24 2033 07/10/24 0552  WBC 12.4* 12.4*   < > 5.7 8.1 7.3 7.6 6.1  NEUTROABS 10.1* 8.1*  --   --   --   --   --   --   HGB 10.8* 9.5*   < > 9.6* 8.0* 7.8* 7.3* 6.8*  HCT 35.5* 31.3*   < > 30.8* 25.4* 24.9* 23.4* 22.2*  MCV 84.5 83.7   < > 82.4 81.4 81.9 81.8 83.5  PLT 249 363   < > 272 306 338 310 289   < > = values in this interval not displayed.    LFT Recent Labs  Lab 07/06/24 0741 07/08/24 1608  AST 26 38  ALT 8 6  ALKPHOS  79 74  BILITOT 1.4* 1.0  PROT 6.7 6.9  ALBUMIN 3.5 3.2*     Antibiotics: Anti-infectives (From admission, onward)    Start     Dose/Rate Route Frequency Ordered Stop   07/09/24 0600  piperacillin-tazobactam (ZOSYN) IVPB 3.375 g  Status:  Discontinued       Placed in Followed by Linked Group   3.375 g 12.5 mL/hr over 240 Minutes Intravenous Every 8 hours 07/08/24 2033 07/09/24 0825   07/08/24 2045  piperacillin-tazobactam (ZOSYN) IVPB 3.375 g       Placed in Followed by Linked Group   3.375 g 100 mL/hr over 30 Minutes Intravenous  Once 07/08/24 2033 07/08/24 2146        CONSULTS general surgery  Code Status: Full code  Family Communication: No family at bedside     Subjective   Patient states that he had a large bowel movement yesterday.  Hemoglobin dropped to 6.8 this morning.  NG tube in place.  KUB obtained this morning shows persistent dilated loops of mid to distal small bowel measuring up to 6.5 cm.  Oral contrast progressed to the colon and rectum.   Objective    Physical Examination:  General-appears in no acute distress NG tube in place Heart-S1-S2, regular, no murmur auscultated Lungs-clear to auscultation bilaterally, no wheezing or crackles auscultated Abdomen-soft, nontender, no organomegaly Extremities-no edema in the lower extremities Neuro-alert, oriented x3, no focal deficit noted   Status is: Inpatient:             John [name]   [facility] If 7PM-7AM, please contact night-coverage at [email or web address], Office  [phone]   07/10/2024, 8:13 AM  LOS: 2 days

## 2024-07-10 NOTE — Progress Notes (Signed)
 Mobility Specialist Progress Note:   07/10/24 1130  Mobility  Activity Pivoted/transferred to/from [facility]  Level of Assistance Minimal assist, patient does 75% or more (+2 (safety))  Assistive Device Other (Comment) (HHA)  Distance Ambulated (ft) 3 ft  Activity Response Tolerated well  Mobility Referral Yes  Mobility visit 1 Mobility  Mobility Specialist Start Time (ACUTE ONLY) 1130  Mobility Specialist Stop Time (ACUTE ONLY) 1140  Mobility Specialist Time Calculation (min) (ACUTE ONLY) 10 min   RN requesting assistance to transfer pt back to bed. Required light minA +2 (for safety) to stand from [facility]. Pt able to take pivotal steps to bed. Required modA to assist legs and trunk into bed. Back in bed with all needs met, alarm on.   Therisa Rana Mobility Specialist Please contact via SecureChat or  Rehab office at [phone]

## 2024-07-11 ENCOUNTER — Inpatient Hospital Stay (HOSPITAL_COMMUNITY)

## 2024-07-11 DIAGNOSIS — K810 Acute cholecystitis: Secondary | ICD-10-CM

## 2024-07-11 DIAGNOSIS — D62 Acute posthemorrhagic anemia: Secondary | ICD-10-CM

## 2024-07-11 DIAGNOSIS — K921 Melena: Secondary | ICD-10-CM

## 2024-07-11 DIAGNOSIS — K2100 Gastro-esophageal reflux disease with esophagitis, without bleeding: Secondary | ICD-10-CM

## 2024-07-11 DIAGNOSIS — I3139 Other pericardial effusion (noninflammatory): Secondary | ICD-10-CM

## 2024-07-11 DIAGNOSIS — K56609 Unspecified intestinal obstruction, unspecified as to partial versus complete obstruction: Secondary | ICD-10-CM | POA: Diagnosis not present

## 2024-07-11 DIAGNOSIS — R195 Other fecal abnormalities: Secondary | ICD-10-CM

## 2024-07-11 DIAGNOSIS — I2699 Other pulmonary embolism without acute cor pulmonale: Secondary | ICD-10-CM | POA: Diagnosis not present

## 2024-07-11 DIAGNOSIS — K920 Hematemesis: Secondary | ICD-10-CM | POA: Diagnosis not present

## 2024-07-11 DIAGNOSIS — K2210 Ulcer of esophagus without bleeding: Secondary | ICD-10-CM

## 2024-07-11 DIAGNOSIS — G20B2 Parkinson's disease with dyskinesia, with fluctuations: Secondary | ICD-10-CM | POA: Diagnosis not present

## 2024-07-11 LAB — BPAM RBC
Blood Product Expiration Date: 202511042359
ISSUE DATE / TIME: 202510250912
Unit Type and Rh: 7300

## 2024-07-11 LAB — CBC
HCT: 23.3 % — ABNORMAL LOW (ref 39.0–52.0)
Hemoglobin: 7.3 g/dL — ABNORMAL LOW (ref 13.0–17.0)
MCH: 26.2 pg (ref 26.0–34.0)
MCHC: 31.3 g/dL (ref 30.0–36.0)
MCV: 83.5 fL (ref 80.0–100.0)
Platelets: 275 K/uL (ref 150–400)
RBC: 2.79 MIL/uL — ABNORMAL LOW (ref 4.22–5.81)
RDW: 15.9 % — ABNORMAL HIGH (ref 11.5–15.5)
WBC: 5.6 K/uL (ref 4.0–10.5)
nRBC: 0 % (ref 0.0–0.2)

## 2024-07-11 LAB — BASIC METABOLIC PANEL WITH GFR
Anion gap: 11 (ref 5–15)
BUN: 28 mg/dL — ABNORMAL HIGH (ref 8–23)
CO2: 19 mmol/L — ABNORMAL LOW (ref 22–32)
Calcium: 8.4 mg/dL — ABNORMAL LOW (ref 8.9–10.3)
Chloride: 114 mmol/L — ABNORMAL HIGH (ref 98–111)
Creatinine, Ser: 0.97 mg/dL (ref 0.61–1.24)
GFR, Estimated: >60 mL/min (ref >60)
Glucose, Bld: 118 mg/dL — ABNORMAL HIGH (ref 70–99)
Potassium: 4.1 mmol/L (ref 3.5–5.1)
Sodium: 144 mmol/L (ref 135–145)

## 2024-07-11 LAB — TYPE AND SCREEN
ABO/RH(D): B POS
Antibody Screen: NEGATIVE
Unit division: 0

## 2024-07-11 LAB — PHOSPHORUS: Phosphorus: 3.8 mg/dL (ref 2.5–4.6)

## 2024-07-11 NOTE — Plan of Care (Signed)

## 2024-07-11 NOTE — Progress Notes (Signed)
 Progress Note  1 Day Post-Op  Subjective: Pt reports passing flatus and liquid BM overnight. Denies abdominal pain or nausea. Wants to eat.   Objective: Vital signs in last 24 hours: Temp:  [97.7 F (36.5 C)-98.5 F (36.9 C)] 98.3 F (36.8 C) (10/26 0832) Pulse Rate:  [84-105] 84 (10/26 0528) Resp:  [13-24] 16 (10/26 0832) BP: (124-154)/(77-95) 137/79 (10/26 0832) SpO2:  [95 %-100 %] 100 % (10/26 0832) Weight:  [103.5 kg-106.3 kg] 103.5 kg (10/26 0528) Last BM Date : 07/09/24  Intake/Output from previous day: 10/25 0701 - 10/26 0700 In: 1073.2 [I.V.:300; Blood:316.7; IV Piggyback:456.5] Out: 880 [Urine:750; Emesis/NG output:130] Intake/Output this shift: No intake/output data recorded.  PE: General: pleasant, WD, obese male who is laying in bed in NAD Heart: regular, rate, and rhythm.   Lungs: Respiratory effort nonlabored Abd: soft, NT, ND, NGT clear Psych: A&Ox3 with an appropriate affect.    Lab Results:  Recent Labs    07/10/24 1454 07/11/24 0315  WBC 6.4 5.6  HGB 7.4* 7.3*  HCT 23.3* 23.3*  PLT 275 275   BMET Recent Labs    07/10/24 0552 07/11/24 0315  NA 141 144  K 3.0* 4.1  CL 110 114*  CO2 20* 19*  GLUCOSE 105* 118*  BUN 43* 28*  CREATININE 1.17 0.97  CALCIUM  8.4* 8.4*   PT/INR Recent Labs    07/08/24 1608  LABPROT 21.6*  INR 1.8*   CMP     Component Value Date/Time   NA 144 07/11/2024 0315   NA 137 12/10/2023 1222   K 4.1 07/11/2024 0315   CL 114 (H) 07/11/2024 0315   CO2 19 (L) 07/11/2024 0315   GLUCOSE 118 (H) 07/11/2024 0315   BUN 28 (H) 07/11/2024 0315   BUN 20 12/10/2023 1222   CREATININE 0.97 07/11/2024 0315   CALCIUM  8.4 (L) 07/11/2024 0315   PROT 6.9 07/08/2024 1608   PROT 6.4 12/10/2023 1222   ALBUMIN 3.2 (L) 07/08/2024 1608   ALBUMIN 4.2 12/10/2023 1222   AST 38 07/08/2024 1608   ALT 6 07/08/2024 1608   ALKPHOS 74 07/08/2024 1608   BILITOT 1.0 07/08/2024 1608   BILITOT 0.7 12/10/2023 1222   GFRNONAA >60  07/11/2024 0315   GFRAA 88 03/13/2020 1531   Lipase     Component Value Date/Time   LIPASE 16 07/08/2024 1608       Studies/Results: DG Abd 1 View Result Date: 07/10/2024 EXAM: 1 VIEW XRAY OF THE ABDOMEN 07/10/2024 04:53:00 PM COMPARISON: Abdominal x-ray dated 07/10/2024. CLINICAL HISTORY: [id number] Encounter for orogastric (OG) tube placement [id number]. NG tube placement Encounter for orogastric (OG) tube placement [id number]. NG tube placement FINDINGS: LINES, TUBES AND DEVICES: Orogastric tube tip is in the mid stomach with cuff at the level of the gastroesophageal junction. BOWEL: Nonobstructive bowel gas pattern. SOFT TISSUES: No opaque urinary calculi. BONES: No acute osseous abnormality. IMPRESSION: 1. Orogastric tube tip is in the mid stomach with the side hole at the level of the gastroesophageal junction. Recommended advancing tube 5 cm. Electronically signed by: Greig Pique MD 07/10/2024 05:22 PM EDT RP Workstation: [id number]   DG Abd 1 View Result Date: 07/10/2024 EXAM: 1 VIEW XRAY OF THE ABDOMEN 07/10/2024 10:04:00 AM COMPARISON: 07/09/2024 CLINICAL HISTORY: SBO (small bowel obstruction) FINDINGS: LINES, TUBES AND DEVICES: Enteric tube in place with tip in gastric antrum. BOWEL: Persistent dilated loops of mid to distal small bowel. Small bowel distension similar prior measuring up to 6.5 cm. Oral contrast has progressed and  is noted in the hepatic flexure, splenic flexure, descending colon and rectum. SOFT TISSUES: No opaque urinary calculi. BONES: No acute osseous abnormality. IMPRESSION: 1. Persistent dilated loops of mid to distal small bowel measuring up to 6.5 cm, similar to prior study. 2. Oral contrast has progressed to the colon and rectum. Electronically signed by: Norleen Boxer MD 07/10/2024 01:58 PM EDT RP Workstation: [id number]   ECHOCARDIOGRAM COMPLETE Result Date: 07/09/2024    ECHOCARDIOGRAM REPORT   Patient Name:   BUNYAN BRIER Date of Exam: 07/09/2024 Medical Rec #:   [id number]        Height:       74.0 in Accession #:    [id number]       Weight:       227.3 lb Date of Birth:  1961/09/04       BSA:          2.295 m Patient Age:    63 years         BP:           138/80 mmHg Patient Gender: M                HR:           118 bpm. Exam Location:  Inpatient Procedure: 2D Echo, Cardiac Doppler and Color Doppler (Both Spectral and Color            Flow Doppler were utilized during procedure). Indications:    Pericardia Effusion I31.3, Syncope R55  History:        Patient has prior history of Echocardiogram examinations, most                 recent 03/11/2024. Signs/Symptoms:Syncope; Risk                 Factors:Hypertension.  Sonographer:    BERNARDA ROCKS Referring Phys: [id number] TIMOTHY S OPYD IMPRESSIONS  1. Left ventricular ejection fraction, by estimation, is >75%. The left ventricle has hyperdynamic function. The left ventricle has no regional wall motion abnormalities. Left ventricular diastolic parameters are consistent with Grade I diastolic dysfunction (impaired relaxation).  2. Right ventricular systolic function is normal. The right ventricular size is normal.  3. A small pericardial effusion is present. The pericardial effusion is circumferential. There is no evidence of cardiac tamponade.  4. The mitral valve is normal in structure. No evidence of mitral valve regurgitation. No evidence of mitral stenosis.  5. The aortic valve is tricuspid. There is mild calcification of the aortic valve. Aortic valve regurgitation is not visualized. Aortic valve sclerosis/calcification is present, without any evidence of aortic stenosis.  6. The inferior vena cava is normal in size with greater than 50% respiratory variability, suggesting right atrial pressure of 3 mmHg. Conclusion(s)/Recommendation(s): Technically limited study with poor acoustic images. FINDINGS  Left Ventricle: Left ventricular ejection fraction, by estimation, is >75%. The left ventricle has hyperdynamic function. The  left ventricle has no regional wall motion abnormalities. The left ventricular internal cavity size was normal in size. There is no left ventricular hypertrophy. Left ventricular diastolic parameters are consistent with Grade I diastolic dysfunction (impaired relaxation). Right Ventricle: The right ventricular size is normal. No increase in right ventricular wall thickness. Right ventricular systolic function is normal. Left Atrium: Left atrial size was normal in size. Right Atrium: Right atrial size was normal in size. Pericardium: A small pericardial effusion is present. The pericardial effusion is circumferential. There is no evidence of cardiac tamponade. Thickening/calcification of pericardium present.  Mitral Valve: The mitral valve is normal in structure. No evidence of mitral valve regurgitation. No evidence of mitral valve stenosis. MV peak gradient, 4.2 mmHg. The mean mitral valve gradient is 2.0 mmHg. Tricuspid Valve: The tricuspid valve is normal in structure. Tricuspid valve regurgitation is trivial. No evidence of tricuspid stenosis. Aortic Valve: The aortic valve is tricuspid. There is mild calcification of the aortic valve. Aortic valve regurgitation is not visualized. Aortic valve sclerosis/calcification is present, without any evidence of aortic stenosis. Aortic valve mean gradient measures 3.0 mmHg. Aortic valve peak gradient measures 7.3 mmHg. Aortic valve area, by VTI measures 3.44 cm. Pulmonic Valve: The pulmonic valve was not well visualized. Pulmonic valve regurgitation is not visualized. No evidence of pulmonic stenosis. Aorta: The aortic root is normal in size and structure. Venous: The inferior vena cava is normal in size with greater than 50% respiratory variability, suggesting right atrial pressure of 3 mmHg. IAS/Shunts: No atrial level shunt detected by color flow Doppler.  LEFT VENTRICLE PLAX 2D LVIDd:         4.40 cm LVIDs:         2.80 cm LV PW:         1.00 cm LV IVS:        1.00 cm  LVOT diam:     2.30 cm LV SV:         62 LV SV Index:   27 LVOT Area:     4.15 cm  LV Volumes (MOD) LV vol d, MOD A2C: 86.1 ml LV vol d, MOD A4C: 144.0 ml LV vol s, MOD A2C: 27.8 ml LV vol s, MOD A4C: 44.7 ml LV SV MOD A2C:     58.3 ml LV SV MOD A4C:     144.0 ml LV SV MOD BP:      75.6 ml RIGHT VENTRICLE             IVC RV Basal diam:  3.50 cm     IVC diam: 1.20 cm RV S prime:     36.90 cm/s TAPSE (M-mode): 2.4 cm LEFT ATRIUM             Index        RIGHT ATRIUM           Index LA diam:        3.00 cm 1.31 cm/m   RA Area:     11.30 cm LA Vol (A2C):   38.5 ml 16.78 ml/m  RA Volume:   23.70 ml  10.33 ml/m LA Vol (A4C):   31.4 ml 13.68 ml/m LA Biplane Vol: 36.2 ml 15.77 ml/m  AORTIC VALVE                    PULMONIC VALVE AV Area (Vmax):    3.45 cm     PV Vmax:       0.97 m/s AV Area (Vmean):   3.30 cm     PV Peak grad:  3.7 mmHg AV Area (VTI):     3.44 cm AV Vmax:           135.00 cm/s AV Vmean:          80.900 cm/s AV VTI:            0.180 m AV Peak Grad:      7.3 mmHg AV Mean Grad:      3.0 mmHg LVOT Vmax:         112.00 cm/s LVOT Vmean:  64.200 cm/s LVOT VTI:          0.149 m LVOT/AV VTI ratio: 0.83  AORTA Ao Root diam: 3.30 cm Ao Asc diam:  3.30 cm MITRAL VALVE MV Area (PHT): 6.77 cm     SHUNTS MV Area VTI:   4.07 cm     Systemic VTI:  0.15 m MV Peak grad:  4.2 mmHg     Systemic Diam: 2.30 cm MV Mean grad:  2.0 mmHg MV Vmax:       1.02 m/s MV Vmean:      70.9 cm/s MV Decel Time: 112 msec MV E velocity: 65.90 cm/s MV A velocity: 107.00 cm/s MV E/A ratio:  0.62 Toribio Fuel MD Electronically signed by Toribio Fuel MD Signature Date/Time: 07/09/2024/8:28:38 PM    Final    DG Abd Portable 1V-Small Bowel Obstruction Protocol-initial, 8 hr delay Result Date: 07/09/2024 EXAM: 1 VIEW XRAY OF THE ABDOMEN 07/09/2024 07:10:00 PM COMPARISON: None available. CLINICAL HISTORY: Small bowel obstruction- 8 hr delay; Best images obtainable d/t pt condition. FINDINGS: LINES, TUBES AND DEVICES: Enter  tube tip is at the level of the gastric antrum, unchanged. BOWEL: There are numerous dilated small bowel loops measuring up to 6.8 cm. No contrast seen within small bowel or colon. Minimal colonic gas present. SOFT TISSUES: No opaque urinary calculi. BONES: No acute osseous abnormality. IMPRESSION: 1. Small bowel obstruction with numerous dilated loops measuring up to 6.8 cm. 2. No contrast seen within small bowel or colon. Minimal colonic gas present. Electronically signed by: Greig Pique MD 07/09/2024 08:24 PM EDT RP Workstation: [id number]    Anti-infectives: Anti-infectives (From admission, onward)    Start     Dose/Rate Route Frequency Ordered Stop   07/09/24 0600  piperacillin-tazobactam (ZOSYN) IVPB 3.375 g  Status:  Discontinued       Placed in Followed by Linked Group   3.375 g 12.5 mL/hr over 240 Minutes Intravenous Every 8 hours 07/08/24 2033 07/09/24 0825   07/08/24 2045  piperacillin-tazobactam (ZOSYN) IVPB 3.375 g       Placed in Followed by Linked Group   3.375 g 100 mL/hr over 30 Minutes Intravenous  Once 07/08/24 2033 07/08/24 2146        Assessment/Plan  SBO - pt having bowel function and clinically feeling better - check KUB this AM, if improving can likely DC NGT and start CLD - no indication for emergent surgical intervention   Melena - per GI, EGD yesterday with esophagitis and small HH, no active bleeding lesions  FEN: NPO, NGT, IVF per primary VTE: SCDs ID: no current abx    LOS: 3 days   I reviewed Consultant GI notes, hospitalist notes, last 24 h vitals and pain scores, last 48 h intake and output, last 24 h labs and trends, and last 24 h imaging results.  This care required moderate level of medical decision making.    Burnard JONELLE Louder, [facility] 07/11/2024, 10:30 AM Please see Amion for pager number during day hours 7:00am-4:30pm

## 2024-07-11 NOTE — Plan of Care (Signed)
  Problem: Education: Goal: Knowledge of General Education information will improve Description: Including pain rating scale, medication(s)/side effects and non-pharmacologic comfort measures Outcome: Not Progressing   Problem: Health Behavior/Discharge Planning: Goal: Ability to manage health-related needs will improve Outcome: Not Progressing   Problem: Clinical Measurements: Goal: Ability to maintain clinical measurements within normal limits will improve Outcome: Not Progressing Goal: Will remain free from infection Outcome: Not Progressing Goal: Diagnostic test results will improve Outcome: Not Progressing Goal: Respiratory complications will improve Outcome: Not Progressing Goal: Cardiovascular complication will be avoided Outcome: Not Progressing   Problem: Nutrition: Goal: Adequate nutrition will be maintained Outcome: Not Progressing   Problem: Coping: Goal: Level of anxiety will decrease Outcome: Not Progressing   Problem: Elimination: Goal: Will not experience complications related to bowel motility Outcome: Not Progressing Goal: Will not experience complications related to urinary retention Outcome: Not Progressing   Problem: Pain Managment: Goal: General experience of comfort will improve and/or be controlled Outcome: Not Progressing

## 2024-07-11 NOTE — Progress Notes (Addendum)
 [facility] Progress Note  CC:  Hematemesis, dark stools and SBO.   Subjective: He is much more alert today.  He is tolerating a clear liquid diet.  Drinking juice and eating Jell-O.  NG tube was discontinued earlier today.  He stated his abdomen feels a lot better today.  No abdominal pain.  He passed a small to moderate amount of loose black stool x 1 this afternoon as reported by his RN.  No chest pain or shortness of breath.   Objective:   EGD 07/10/2024:  - LA Grade D reflux esophagitis with no bleeding.  - Small hiatal hernia. - Normal stomach.  - Normal examined duodenum. - No specimens collected.  Vital signs in last 24 hours: Temp:  [97.7 F (36.5 C)-98.8 F (37.1 C)] 98.8 F (37.1 C) (10/26 1225) Pulse Rate:  [84-100] 99 (10/26 1225) Resp:  [13-20] 16 (10/26 0832) BP: (124-147)/(77-95) 144/83 (10/26 1225) SpO2:  [95 %-100 %] 97 % (10/26 1225) Weight:  [103.5 kg-106.3 kg] 103.5 kg (10/26 0528) Last BM Date : 07/09/24 General: Alert 63 year old male in no acute distress. Heart: Regular rate and rhythm, no murmurs. Pulm: Breath sounds clear throughout.  On room air. Abdomen: Abdomen soft, mild distention.  Nontender.  Positive bowel sounds to all 4 quadrants. Extremities: No lower extremity edema. Neurologic:  Alert and oriented x 3.  Speech is clear.  Moves all extremities weakly. Psych:  Alert and cooperative. Normal mood and affect.  Intake/Output from previous day: 10/25 0701 - 10/26 0700 In: 1073.2 [I.V.:300; Blood:316.7; IV Piggyback:456.5] Out: 880 [Urine:750; Emesis/NG output:130] Intake/Output this shift: No intake/output data recorded.  Lab Results: Recent Labs    07/10/24 0855 07/10/24 1454 07/11/24 0315  WBC 6.8 6.4 5.6  HGB 6.8* 7.4* 7.3*  HCT 22.1* 23.3* 23.3*  PLT 311 275 275   BMET Recent Labs    07/09/24 1444 07/10/24 0552 07/11/24 0315  NA 142 141 144  K 3.0* 3.0* 4.1  CL 113* 110 114*  CO2 20* 20* 19*   GLUCOSE 118* 105* 118*  BUN 58* 43* 28*  CREATININE 1.38* 1.17 0.97  CALCIUM  8.1* 8.4* 8.4*   LFT Recent Labs    07/08/24 1608  PROT 6.9  ALBUMIN 3.2*  AST 38  ALT 6  ALKPHOS 74  BILITOT 1.0   PT/INR Recent Labs    07/08/24 1608  LABPROT 21.6*  INR 1.8*   Hepatitis Panel No results for input(s): HEPBSAG, HCVAB, HEPAIGM, HEPBIGM in the last 72 hours.  DG Abd Portable 1V Result Date: 07/11/2024 EXAM: 1 VIEW XRAY OF THE ABDOMEN 07/11/2024 10:31:00 AM COMPARISON: 07/10/2024 CLINICAL HISTORY: SBO (small bowel obstruction) FINDINGS: LINES, TUBES AND DEVICES: Enteric tube with side port in distal esophagus. BOWEL: Improved bowel dilatation compared to previous study. Similar appearance of retained enteric contrast material within the colon and rectum. SOFT TISSUES: No opaque urinary calculi. BONES: No acute osseous abnormality. IMPRESSION: 1. Enteric tube malposition with side port projecting over the distal esophagus; advancement recommended to position side port within the stomach. 2. Improved bowel dilatation compared to prior exam. Electronically signed by: Waddell Calk MD 07/11/2024 10:57 AM EDT RP Workstation: [id number]   DG Abd 1 View Result Date: 07/10/2024 EXAM: 1 VIEW XRAY OF THE ABDOMEN 07/10/2024 04:53:00 PM COMPARISON: Abdominal x-ray dated 07/10/2024. CLINICAL HISTORY: [id number] Encounter for orogastric (OG) tube placement [id number]. NG tube placement Encounter for orogastric (OG) tube placement [id number]. NG tube placement FINDINGS: LINES, TUBES AND DEVICES: Orogastric tube tip  is in the mid stomach with cuff at the level of the gastroesophageal junction. BOWEL: Nonobstructive bowel gas pattern. SOFT TISSUES: No opaque urinary calculi. BONES: No acute osseous abnormality. IMPRESSION: 1. Orogastric tube tip is in the mid stomach with the side hole at the level of the gastroesophageal junction. Recommended advancing tube 5 cm. Electronically signed by: Greig Pique MD  07/10/2024 05:22 PM EDT RP Workstation: [id number]   DG Abd 1 View Result Date: 07/10/2024 EXAM: 1 VIEW XRAY OF THE ABDOMEN 07/10/2024 10:04:00 AM COMPARISON: 07/09/2024 CLINICAL HISTORY: SBO (small bowel obstruction) FINDINGS: LINES, TUBES AND DEVICES: Enteric tube in place with tip in gastric antrum. BOWEL: Persistent dilated loops of mid to distal small bowel. Small bowel distension similar prior measuring up to 6.5 cm. Oral contrast has progressed and is noted in the hepatic flexure, splenic flexure, descending colon and rectum. SOFT TISSUES: No opaque urinary calculi. BONES: No acute osseous abnormality. IMPRESSION: 1. Persistent dilated loops of mid to distal small bowel measuring up to 6.5 cm, similar to prior study. 2. Oral contrast has progressed to the colon and rectum. Electronically signed by: Norleen Boxer MD 07/10/2024 01:58 PM EDT RP Workstation: [id number]   DG Abd Portable 1V-Small Bowel Obstruction Protocol-initial, 8 hr delay Result Date: 07/09/2024 EXAM: 1 VIEW XRAY OF THE ABDOMEN 07/09/2024 07:10:00 PM COMPARISON: None available. CLINICAL HISTORY: Small bowel obstruction- 8 hr delay; Best images obtainable d/t pt condition. FINDINGS: LINES, TUBES AND DEVICES: Enter tube tip is at the level of the gastric antrum, unchanged. BOWEL: There are numerous dilated small bowel loops measuring up to 6.8 cm. No contrast seen within small bowel or colon. Minimal colonic gas present. SOFT TISSUES: No opaque urinary calculi. BONES: No acute osseous abnormality. IMPRESSION: 1. Small bowel obstruction with numerous dilated loops measuring up to 6.8 cm. 2. No contrast seen within small bowel or colon. Minimal colonic gas present. Electronically signed by: Greig Pique MD 07/09/2024 08:24 PM EDT RP Workstation: [id number]    Patient Profile: John [name] is a 63 y.o. male with a past medical history of hypertension, bilateral PE on Eliquis , Parkinson's disease, frequent falls at home, subdural  hematoma 03/2024, seizures, anemia and GERD.   Assessment / Plan:  63 year old male admitted with N/V with coffee ground emesis and dark tarry stools x 2 days with acute on chronic anemia.  Positive FOBT. On Eliquis . Hg 9.5. EGD 10/25 identified grade D reflux esophagitis without bleeding, small hiatal hernia and a normal stomach and duodenum.  - Continue clear liquid diet - Keep head of bed up at least 30 degrees at all times - Sit up in chair for meals - Transfuse for hemoglobin level < 8 or as needed if symptomatic - Stat abdominal/pelvic CTA if patient develops melena or hematochezia - PPI IV twice daily - Await further recommendations per Dr. Legrand -General Surgery following   Generalized abdominal pain, small bowel obstruction. Abdominal/pelvic CTA identified a small bowel obstruction with transition from dilated to nondilated bowel within the distal jejunum/ileum within the lower pelvis.  Received Zosyn IV x 1.  NGT placed, 700 cc of dark brown liquid in canister. General surgery consulted. SBO protocol imaging 10/24 showed small bowel obstruction with numerous dilated loops, no contrast seen in the small bowel or colon. NG tube placed to LIS. Abdominal x-ray today showed improved bowel dilatation therefore NG tube was removed per general surgery.  - See plan above  - IV fluids and pain management per the hospitalist   GERD. CTA  showed fluid-filled distal thoracic esophagus which may reflect sequela of reflux.  EGD 10/25 identified bradycardia reflux esophagitis without bleeding, a small hiatal hernia otherwise was unremarkable. - Continue PPI IV twice daily   CTA showed thickened gallbladder with some stranding   AKI, resolved - IV fluids per the hospitalist    History of PE 02/2024, Eliquis  on hold since admission 7/23   Small pericardial effusion per CTA.  ECHO 12/24 showed LVEF > 75%, grade 1 diastolic dysfunction and a small pericardial effusion.   Parkinson's disease     Principal Problem:   SBO (small bowel obstruction) (HCC) Active Problems:   Parkinson's disease with fluctuating manifestations   Hypokalemia   AKI (acute kidney injury)   Bilateral pulmonary embolism   Impairment of balance   GI bleeding   Syncope   Acute cholecystitis   Pericardial effusion   History of pulmonary embolus (PE)   Chronic anticoagulation     LOS: 3 days   Elida CHRISTELLA Shawl  07/11/2024,  2:59 PM  I have taken an interval history, thoroughly reviewed the chart and examined the patient. I agree with the Advanced Practitioner's note, impression and recommendations, and have recorded additional findings, impressions and recommendations below. I performed a substantive portion of this encounter (>50% time spent), including a complete performance of the medical decision making.  My additional thoughts are as follows:  I think he is passing some old blood from the esophagitis related GI bleeding.  This is becoming more evident in the last 24 hours because his bowel obstruction is opening up.  Hemoglobin stable from yesterday.  It is still not clear to me if this man was on oral anticoagulation coming in here or if it had been stopped after his fall and subdural hematoma months ago.  If he does have an indication for [facility], it should still be held at present until he has further clinical improvement and some healing of his severe esophagitis.  Our group will see him tomorrow, Dr. Stacia will take over the consult service then.   _________________  This consultation required a moderate degree of medical decision making due to the nature and complexity of the acute condition(s) being evaluated as well as the patient's medical comorbidities.  Victory LITTIE Brand III [phone]

## 2024-07-11 NOTE — Progress Notes (Signed)
 [facility]  PROGRESS NOTE  John [name] FMW:[id number] DOB: 06/23/1961 DOA: 07/08/2024 PCP: [name], MD   Brief HPI:    63 y.o. male with medical history significant for Parkinson disease, mild cognitive impairment, PE in June 2025 on Eliquis , and BPH who presents with dark tarry stool, dark emesis, and abdominal pain.   Patient reports that he developed abdominal pain yesterday.  This has been associated with nausea, loose dark, tarry stools, and then dark emesis today.    Assessment/Plan:    Small bowel obstruction -Had bowel movement and also passing gas -Initially seen on CT abdomen/pelvis - General Surgery consulted, NG tube placed - Small bowel protocol ordered per general surgery, KUB obtained this morning shows persistent dilated loops of mid to distal small bowel measuring up to 6.5 cm, oral contrast progressed to colon and rectum. - Further management per general surgery  Dark tarry stools/melena - Gastroenterology consulted; underwent EGD on 10/25 which showed esophagitis with no bleeding -Recommend CT angiogram abdomen/pelvis if further episodes of bleeding - Patient is on Eliquis  which is currently on hold  Anemia -Due to above -Hemoglobin is down to 6.8, status post 1 unit PRBC -Hemoglobin is up to 7.3 today   Syncope - Patient had syncope upon standing from bedside commode in ED - He has history of orthostatic hypotension, also hypovolemic in setting of nausea vomiting and diarrhea and GI bleed - Continue IV hydration, transfuse PRBC as needed  Acute kidney injury - Serum creatinine 1.70 on admission, it was 1.05  2 days earlier - No hydronephrosis seen on CT abdomen/pelvis - Likely prerenal in setting of nausea vomiting and GI bleed as above - Started on IV hydration, creatinine has improved to 0.97  History of pulmonary embolism - Had pulmonary embolism in June 2025 - Anticoagulation on hold   Parkinson disease, mild cognitive  impairment - Sinemet  on hold  Hypokalemia - Replete  Hypophosphatemia - Replete  Pericardial effusion - Small pericardial effusion noted on CT in the ED - Echocardiogram showed EF 75%, grade 1 diastolic dysfunction; also showed small pericardial effusion        DVT prophylaxis: SCDs  Medications     bisacodyl   10 mg Rectal Daily   pantoprazole  (PROTONIX ) IV  40 mg Intravenous Q12H   sodium chloride  flush  3 mL Intravenous Q12H     Data Reviewed:   CBG:  Recent Labs  Lab 07/08/24 1702  GLUCAP 223*    SpO2: 100 %    Vitals:   07/10/24 1742 07/10/24 2326 07/11/24 0528 07/11/24 0832  BP: (!) 143/78 124/80 (!) 137/95 137/79  Pulse: 99 98 84   Resp: 16 20 15 16   Temp:  97.8 F (36.6 C) 98 F (36.7 C) 98.3 F (36.8 C)  TempSrc:  Axillary Axillary Oral  SpO2: 98% 95% 100% 100%  Weight:   103.5 kg   Height:          Data Reviewed:  Basic Metabolic Panel: Recent Labs  Lab 07/08/24 1608 07/08/24 1719 07/09/24 0237 07/09/24 1444 07/10/24 0552 07/11/24 0315  NA 137 137 140 142 141 144  K 3.0* 3.0* 2.9* 3.0* 3.0* 4.1  CL 102 104 108 113* 110 114*  CO2 16*  --  17* 20* 20* 19*  GLUCOSE 197* 202* 135* 118* 105* 118*  BUN 53* 49* 63* 58* 43* 28*  CREATININE 1.70* 1.70* 1.39* 1.38* 1.17 0.97  CALCIUM  8.7*  --  7.8* 8.1* 8.4* 8.4*  MG 2.0  --   --   --  1.9  --   PHOS  --   --   --   --  1.3* 3.8    CBC: Recent Labs  Lab 07/06/24 0741 07/08/24 1608 07/08/24 1719 07/09/24 2033 07/10/24 0552 07/10/24 0855 07/10/24 1454 07/11/24 0315  WBC 12.4* 12.4*   < > 7.6 6.1 6.8 6.4 5.6  NEUTROABS 10.1* 8.1*  --   --   --   --   --   --   HGB 10.8* 9.5*   < > 7.3* 6.8* 6.8* 7.4* 7.3*  HCT 35.5* 31.3*   < > 23.4* 22.2* 22.1* 23.3* 23.3*  MCV 84.5 83.7   < > 81.8 83.5 83.1 82.3 83.5  PLT 249 363   < > 310 289 311 275 275   < > = values in this interval not displayed.    LFT Recent Labs  Lab 07/06/24 0741 07/08/24 1608  AST 26 38  ALT 8 6   ALKPHOS 79 74  BILITOT 1.4* 1.0  PROT 6.7 6.9  ALBUMIN 3.5 3.2*     Antibiotics: Anti-infectives (From admission, onward)    Start     Dose/Rate Route Frequency Ordered Stop   07/09/24 0600  piperacillin-tazobactam (ZOSYN) IVPB 3.375 g  Status:  Discontinued       Placed in Followed by Linked Group   3.375 g 12.5 mL/hr over 240 Minutes Intravenous Every 8 hours 07/08/24 2033 07/09/24 0825   07/08/24 2045  piperacillin-tazobactam (ZOSYN) IVPB 3.375 g       Placed in Followed by Linked Group   3.375 g 100 mL/hr over 30 Minutes Intravenous  Once 07/08/24 2033 07/08/24 2146        CONSULTS general surgery  Code Status: Full code  Family Communication: No family at bedside     Subjective   Patient says that he is hungry  Objective    Physical Examination:  Appears in no acute distress S1-S2, regular, no murmur auscultated NG tube in place Abdomen is soft, nontender Extremities no edema   Status is: Inpatient:             [name]   [facility] If 7PM-7AM, please contact night-coverage at [email or web address], Office  [phone]   07/11/2024, 8:47 AM  LOS: 3 days

## 2024-07-12 ENCOUNTER — Encounter (HOSPITAL_COMMUNITY): Payer: Self-pay | Admitting: Gastroenterology

## 2024-07-12 DIAGNOSIS — G20B2 Parkinson's disease with dyskinesia, with fluctuations: Secondary | ICD-10-CM | POA: Diagnosis not present

## 2024-07-12 DIAGNOSIS — K2210 Ulcer of esophagus without bleeding: Secondary | ICD-10-CM | POA: Diagnosis not present

## 2024-07-12 DIAGNOSIS — K56609 Unspecified intestinal obstruction, unspecified as to partial versus complete obstruction: Secondary | ICD-10-CM | POA: Diagnosis not present

## 2024-07-12 DIAGNOSIS — K921 Melena: Secondary | ICD-10-CM | POA: Diagnosis not present

## 2024-07-12 DIAGNOSIS — K810 Acute cholecystitis: Secondary | ICD-10-CM | POA: Diagnosis not present

## 2024-07-12 DIAGNOSIS — I2699 Other pulmonary embolism without acute cor pulmonale: Secondary | ICD-10-CM | POA: Diagnosis not present

## 2024-07-12 LAB — BASIC METABOLIC PANEL WITH GFR
Anion gap: 9 (ref 5–15)
BUN: 24 mg/dL — ABNORMAL HIGH (ref 8–23)
CO2: 21 mmol/L — ABNORMAL LOW (ref 22–32)
Calcium: 8.7 mg/dL — ABNORMAL LOW (ref 8.9–10.3)
Chloride: 113 mmol/L — ABNORMAL HIGH (ref 98–111)
Creatinine, Ser: 1.05 mg/dL (ref 0.61–1.24)
GFR, Estimated: >60 mL/min (ref >60)
Glucose, Bld: 115 mg/dL — ABNORMAL HIGH (ref 70–99)
Potassium: 3.4 mmol/L — ABNORMAL LOW (ref 3.5–5.1)
Sodium: 143 mmol/L (ref 135–145)

## 2024-07-12 LAB — CBC
HCT: 25.2 % — ABNORMAL LOW (ref 39.0–52.0)
Hemoglobin: 7.9 g/dL — ABNORMAL LOW (ref 13.0–17.0)
MCH: 26.3 pg (ref 26.0–34.0)
MCHC: 31.3 g/dL (ref 30.0–36.0)
MCV: 84 fL (ref 80.0–100.0)
Platelets: 336 K/uL (ref 150–400)
RBC: 3 MIL/uL — ABNORMAL LOW (ref 4.22–5.81)
RDW: 16 % — ABNORMAL HIGH (ref 11.5–15.5)
WBC: 9.8 K/uL (ref 4.0–10.5)
nRBC: 0 % (ref 0.0–0.2)

## 2024-07-12 MED ORDER — POTASSIUM CHLORIDE 20 MEQ PO PACK
40.0000 meq | PACK | Freq: Once | ORAL | Status: AC
  Administered 2024-07-12: 40 meq via ORAL
  Filled 2024-07-12: qty 2

## 2024-07-12 MED ORDER — CARBIDOPA-LEVODOPA ER 50-200 MG PO TBCR
1.0000 | EXTENDED_RELEASE_TABLET | ORAL | Status: DC
  Administered 2024-07-12 – 2024-07-15 (×17): 1 via ORAL
  Filled 2024-07-12 (×22): qty 1

## 2024-07-12 NOTE — Progress Notes (Signed)
 [facility]  PROGRESS NOTE  John [name] FMW:[id number] DOB: 06/21/1961 DOA: 07/08/2024 PCP: John [name], MD   Brief HPI:    63 y.o. male with medical history significant for Parkinson disease, mild cognitive impairment, PE in June 2025 on Eliquis , and BPH who presents with dark tarry stool, dark emesis, and abdominal pain.   Patient reports that he developed abdominal pain yesterday.  This has been associated with nausea, loose dark, tarry stools, and then dark emesis today.    Assessment/Plan:    Small bowel obstruction -Resolved -NG tube was placed, removed per general surgery -Initially seen on CT abdomen/pelvis - General Surgery consulted, NG tube placed - Abdominal x-ray from 10/26 showed contrast in colon - Diet advanced to full liquid.  Advance as tolerated - General Surgery has signed off  Dark tarry stools/melena - Gastroenterology consulted; underwent EGD on 10/25 which showed esophagitis with no bleeding -Recommend CT angiogram abdomen/pelvis if further episodes of bleeding - Patient is on Eliquis  which is currently on hold - Gastroenterology following  Anemia -Due to above -Hemoglobin is down to 6.8, status post 1 unit PRBC -Hemoglobin is up to 7.9 today   Syncope - Patient had syncope upon standing from bedside commode in ED - He has history of orthostatic hypotension, also hypovolemic in setting of nausea vomiting and diarrhea and GI bleed - Continue IV hydration, transfuse PRBC as needed  Acute kidney injury - Serum creatinine 1.70 on admission, it was 1.05  2 days earlier - No hydronephrosis seen on CT abdomen/pelvis - Likely prerenal in setting of nausea vomiting and GI bleed as above - Started on IV hydration, creatinine has improved to 0.97  History of pulmonary embolism - Had pulmonary embolism in June 2025 - Anticoagulation on hold   Parkinson disease, mild cognitive impairment - Sinemet  will be restarted today  Hypokalemia -  Potassium is 3.4, will replace potassium and follow BMP in am  Hypophosphatemia - Replete  Pericardial effusion - Small pericardial effusion noted on CT in the ED - Echocardiogram showed EF 75%, grade 1 diastolic dysfunction; also showed small pericardial effusion        DVT prophylaxis: SCDs  Medications     bisacodyl   10 mg Rectal Daily   pantoprazole  (PROTONIX ) IV  40 mg Intravenous Q12H   sodium chloride  flush  3 mL Intravenous Q12H     Data Reviewed:   CBG:  Recent Labs  Lab 07/08/24 1702  GLUCAP 223*    SpO2: 99 %    Vitals:   07/11/24 1655 07/11/24 1923 07/12/24 0005 07/12/24 0530  BP: (!) 142/86 (!) 155/83 (!) 151/87 (!) 161/91  Pulse: (!) 101 98 96 91  Resp: 17 16 16 13   Temp: 97.9 F (36.6 C) 98 F (36.7 C) 98.2 F (36.8 C) 97.8 F (36.6 C)  TempSrc: Oral Oral Oral Oral  SpO2: 98% 99% 98% 99%  Weight:    103.1 kg  Height:          Data Reviewed:  Basic Metabolic Panel: Recent Labs  Lab 07/08/24 1608 07/08/24 1719 07/09/24 0237 07/09/24 1444 07/10/24 0552 07/11/24 0315 07/12/24 0414  NA 137   < > 140 142 141 144 143  K 3.0*   < > 2.9* 3.0* 3.0* 4.1 3.4*  CL 102   < > 108 113* 110 114* 113*  CO2 16*  --  17* 20* 20* 19* 21*  GLUCOSE 197*   < > 135* 118* 105* 118* 115*  BUN  53*   < > 63* 58* 43* 28* 24*  CREATININE 1.70*   < > 1.39* 1.38* 1.17 0.97 1.05  CALCIUM  8.7*  --  7.8* 8.1* 8.4* 8.4* 8.7*  MG 2.0  --   --   --  1.9  --   --   PHOS  --   --   --   --  1.3* 3.8  --    < > = values in this interval not displayed.    CBC: Recent Labs  Lab 07/06/24 0741 07/08/24 1608 07/08/24 1719 07/10/24 0552 07/10/24 0855 07/10/24 1454 07/11/24 0315 07/12/24 0414  WBC 12.4* 12.4*   < > 6.1 6.8 6.4 5.6 9.8  NEUTROABS 10.1* 8.1*  --   --   --   --   --   --   HGB 10.8* 9.5*   < > 6.8* 6.8* 7.4* 7.3* 7.9*  HCT 35.5* 31.3*   < > 22.2* 22.1* 23.3* 23.3* 25.2*  MCV 84.5 83.7   < > 83.5 83.1 82.3 83.5 84.0  PLT 249 363   < > 289  311 275 275 336   < > = values in this interval not displayed.    LFT Recent Labs  Lab 07/06/24 0741 07/08/24 1608  AST 26 38  ALT 8 6  ALKPHOS 79 74  BILITOT 1.4* 1.0  PROT 6.7 6.9  ALBUMIN 3.5 3.2*     Antibiotics: Anti-infectives (From admission, onward)    Start     Dose/Rate Route Frequency Ordered Stop   07/09/24 0600  piperacillin-tazobactam (ZOSYN) IVPB 3.375 g  Status:  Discontinued       Placed in Followed by Linked Group   3.375 g 12.5 mL/hr over 240 Minutes Intravenous Every 8 hours 07/08/24 2033 07/09/24 0825   07/08/24 2045  piperacillin-tazobactam (ZOSYN) IVPB 3.375 g       Placed in Followed by Linked Group   3.375 g 100 mL/hr over 30 Minutes Intravenous  Once 07/08/24 2033 07/08/24 2146        CONSULTS general surgery  Code Status: Full code  Family Communication: No family at bedside     Subjective   Passing flatus.  NG tube out.  Objective    Physical Examination:  Appears in no acute distress S1-S2, regular Abdomen soft, nontender Extremities no edema Lungs are clear to auscultation bilaterally   Status is: Inpatient:             John [name]   [facility] If 7PM-7AM, please contact night-coverage at [email or web address], Office  [phone]   07/12/2024, 8:55 AM  LOS: 4 days

## 2024-07-12 NOTE — Anesthesia Postprocedure Evaluation (Signed)
 Anesthesia Post Note  Patient: John [name]  Procedure(s) Performed: EGD (ESOPHAGOGASTRODUODENOSCOPY)     Patient location during evaluation: PACU Anesthesia Type: General Level of consciousness: awake Pain management: pain level controlled Vital Signs Assessment: post-procedure vital signs reviewed and stable Respiratory status: spontaneous breathing, nonlabored ventilation and respiratory function stable Cardiovascular status: blood pressure returned to baseline and stable Postop Assessment: no apparent nausea or vomiting Anesthetic complications: no   No notable events documented.  Last Vitals:  Vitals:   07/12/24 0005 07/12/24 0530  BP: (!) 151/87 (!) 161/91  Pulse: 96 91  Resp: 16 13  Temp: 36.8 C 36.6 C  SpO2: 98% 99%    Last Pain:  Vitals:   07/12/24 0530  TempSrc: Oral  PainSc:                  Cristiana Yochim P Arshia Spellman

## 2024-07-12 NOTE — TOC Initial Note (Addendum)
 Transition of Care [facility]) - Initial/Assessment Note    Patient Details  Name: John [name] MRN: [id number] Date of Birth: October 20, 1960  Transition of [facility]) CM/SW Contact:    Isaiah Public, LCSWA Phone Number: 07/12/2024, 4:03 PM  Clinical Narrative:                  CSW received consult for possible SNF placement at time of discharge. Due to patients current orientation CSW spoke with patients sister [name] regarding PT recommendation of SNF placement at time of discharge. Patients sister [name] reports PTA patient comes from [name] for short term rehab.Patients sister expressed understanding of PT recommendation and is agreeable to SNF placement for patient at time of discharge. Patients sister [name] agreeable for patient to return to [name] for short term rehab. CSW discussed insurance authorization process. All questions answered. No further questions reported at this time. CSW to continue to follow and assist with discharge planning needs.   Update- CSW spoke with Leontine with [name] who confirmed SNF bed for patient. Logan informed CSW that facility will start insurance authorization for patient.  Expected Discharge Plan: Skilled Nursing Facility Barriers to Discharge: Continued Medical Work up   Patient Goals and CMS Choice     Choice offered to / list presented to : Sibling (sister [name])      Expected Discharge Plan and Services In-house Referral: Clinical Social Work     Living arrangements for the past 2 months:  (from [facility] short term rehab)                                      Prior Living Arrangements/Services Living arrangements for the past 2 months:  (from [name] short term rehab) Lives with:: Roommate Patient language and need for interpreter reviewed:: Yes        Need for Family Participation in Patient Care: Yes (Comment)     Criminal Activity/Legal Involvement Pertinent to Current Situation/Hospitalization: No - Comment  as needed  Activities of Daily Living      Permission Sought/Granted Permission sought to share information with : Case Manager, Magazine Features Editor, Family Supports                Emotional Assessment       Orientation: : Oriented to Self Alcohol / Substance Use: Not Applicable Psych Involvement: No (comment)  Admission diagnosis:  SBO (small bowel obstruction) (HCC) [K56.609] Patient Active Problem List   Diagnosis Date Noted   Melena 07/11/2024   ABLA (acute blood loss anemia) 07/11/2024   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   History of pulmonary embolus (PE) 07/09/2024   Chronic anticoagulation 07/09/2024   SBO (small bowel obstruction) (HCC) 07/08/2024   GI bleeding 07/08/2024   Syncope 07/08/2024   Acute cholecystitis 07/08/2024   Pericardial effusion 07/08/2024   Mild neurocognitive disorder due to multiple etiologies 06/24/2024   Cervical myelopathy    Frequent falls 03/24/2024   Acute metabolic encephalopathy 03/24/2024   Hypomagnesemia 03/24/2024   BPH with urinary obstruction 03/24/2024   Obesity (BMI 30-39.9) 03/24/2024   AKI (acute kidney injury) 03/16/2024   Bilateral pulmonary embolism 03/16/2024   Normocytic anemia 03/16/2024   Lactic acidosis 03/16/2024   Hypokalemia 03/10/2024   Hyponatremia 03/10/2024   Lateral epicondylitis of right elbow 03/10/2024   Pain in left knee 02/16/2024   Neck pain 12/30/2023   Tinnitus of both ears 12/10/2023   Parkinson's disease  with fluctuating manifestations 12/02/2022   Impairment of balance 03/12/2022   Lymphedema 03/12/2022   Essential hypertension 10/29/2021   Gastroesophageal reflux disease without esophagitis 10/29/2021   Pain of lumbar spine 08/21/2020   PCP:  [name], MD Pharmacy:   [facility] - [place], [place] - [address] [place] [address] Phone: [phone] Fax: [phone]     Social Drivers of Health (SDOH) Social  History: SDOH Screenings   Food Insecurity: No Food Insecurity (07/11/2024)  Housing: Low Risk  (07/11/2024)  Transportation Needs: No Transportation Needs (07/11/2024)  Utilities: Not At Risk (07/11/2024)  Depression (PHQ2-9): Low Risk  (12/10/2023)  Tobacco Use: Medium Risk (07/08/2024)   SDOH Interventions:     Readmission Risk Interventions     No data to display

## 2024-07-12 NOTE — Evaluation (Signed)
 Physical Therapy Evaluation Patient Details Name: John [name] MRN: [id number] DOB: Feb 08, 1961 Today's Date: 07/12/2024  History of Present Illness  The pt is a 63 yo male presenting 10/23 from [facility] with tarry stools and dark emesis. Work up revealed SBO. S/p upper endoscopy 10/25. PMH includes: PE, cervical myelopathy, HTN, GERD, frequent falls, SDH, UTI, seizure, and Parkinson's.   Clinical Impression  Pt in bed upon arrival of PT, agreeable to evaluation at this time. Prior to admission the pt reports he was ambulating with use of RW or mobilizing with WC for longer distances at [place], working with PT, OT, and SLP on a regular basis. The pt requires increased assist to complete transition to sitting EOB, needed significantly increased time, an dup to modA to complete transition. The pt also needed mod-maxA to complete standing from EOB, but only minA to rise from [facility] due to improved use of pt's arms. The pt needed modA to maintain standing balance, and had incontinence of stool in standing that further limited progression of gait and mobility. Will benefit from continued skilled PT and OT acutely to progress functional mobility, strength, balance, and transfer ability, as well as resuming rehab at [facility] upon d/c.     If plan is discharge home, recommend the following: A lot of help with walking and/or transfers;A little help with bathing/dressing/bathroom;Assistance with cooking/housework;Assist for transportation;Help with stairs or ramp for entrance   Can travel by private vehicle   No    Equipment Recommendations None recommended by PT  Recommendations for Other Services  OT consult    Functional Status Assessment Patient has had a recent decline in their functional status and demonstrates the ability to make significant improvements in function in a reasonable and predictable amount of time.     Precautions / Restrictions Precautions Precautions:  Fall Recall of Precautions/Restrictions: Intact Restrictions Weight Bearing Restrictions Per Provider Order: No      Mobility  Bed Mobility Overal bed mobility: Needs Assistance Bed Mobility: Supine to Sit, Sit to Supine     Supine to sit: Mod assist, Used rails, HOB elevated, +2 for physical assistance Sit to supine: Min assist, +2 for physical assistance, HOB elevated   General bed mobility comments: modA to move LE and to complete scooting to EOB with use of pad, pt with poor initiation    Transfers Overall transfer level: Needs assistance Equipment used: Rolling walker (2 wheels) Transfers: Sit to/from Stand, Bed to chair/wheelchair/BSC Sit to Stand: Mod assist, Max assist, +2 physical assistance, Min assist   Step pivot transfers: Mod assist, +2 physical assistance       General transfer comment: mod-max from EOB due to poor power to rise and limited anterior wt shift. then minA from [facility] with BUE support on armrests. modA to manage lateral steps, max cues for step length and clearance    Ambulation/Gait Ambulation/Gait assistance: Mod assist, +2 safety/equipment Gait Distance (Feet): 3 Feet Assistive device: Rolling walker (2 wheels) Gait Pattern/deviations: Step-to pattern, Decreased stride length, Steppage, Trunk flexed (`) Gait velocity: decreased Gait velocity interpretation: <1.31 ft/sec, indicative of household ambulator   General Gait Details: modA to manage lateral steps, max cues for step length and clearance, limited by fatigue and incontinence of stool     Balance Overall balance assessment: Needs assistance, History of Falls Sitting-balance support: Bilateral upper extremity supported, Feet supported Sitting balance-Leahy Scale: Poor Sitting balance - Comments: minA to GCA Postural control: Posterior lean Standing balance support: Bilateral upper extremity supported, During  functional activity Standing balance-Leahy Scale: Poor Standing balance  comment: modA initially to maintain static stance, then minA with continued practice, dependent on BUE support                             Pertinent Vitals/Pain Pain Assessment Pain Assessment: No/denies pain    Home Living Family/patient expects to be discharged to:: Skilled nursing facility                   Additional Comments: [name] SN[facility]    Prior Function Prior Level of Function : Needs assist             Mobility Comments: reports using WC in facility, but states RW when asked what DME he uses most often. reports no falls since [facility], working with PT there on balance ADLs Comments: pt reports independence, working with OT at [facility]. assist as needed     Extremity/Trunk Assessment   Upper Extremity Assessment Upper Extremity Assessment: Generalized weakness (tremors at baseline)    Lower Extremity Assessment Lower Extremity Assessment: Generalized weakness (grossly 4-/5 with limited ROM, poor endurance)    Cervical / Trunk Assessment Cervical / Trunk Assessment: Kyphotic  Communication   Communication Communication: No apparent difficulties Factors Affecting Communication: Reduced clarity of speech    Cognition Arousal: Alert Behavior During Therapy: WFL for tasks assessed/performed   PT - Cognitive impairments: No family/caregiver present to determine baseline, Problem solving, Safety/Judgement, Sequencing, Initiation                       PT - Cognition Comments: pt needing increased cues and cues for sequencing Following commands: Impaired Following commands impaired: Follows one step commands inconsistently, Follows one step commands with increased time     Cueing Cueing Techniques: Verbal cues     General Comments General comments (skin integrity, edema, etc.): BP elevated but stable, incontinent of stool    Exercises     Assessment/Plan    PT Assessment Patient needs continued PT services  PT Problem List  Decreased strength;Decreased activity tolerance;Decreased balance;Decreased mobility;Decreased safety awareness       PT Treatment Interventions DME instruction;Functional mobility training;Gait training;Stair training;Therapeutic activities;Therapeutic exercise;Balance training;Neuromuscular re-education;Patient/family education    PT Goals (Current goals can be found in the Care Plan section)  Acute Rehab PT Goals Patient Stated Goal: to return to baseline mobility PT Goal Formulation: With patient Time For Goal Achievement: 07/26/24 Potential to Achieve Goals: Good    Frequency Min 2X/week        AM-PAC PT 6 Clicks Mobility  Outcome Measure Help needed turning from your back to your side while in a flat bed without using bedrails?: A Little Help needed moving from lying on your back to sitting on the side of a flat bed without using bedrails?: A Lot Help needed moving to and from a bed to a chair (including a wheelchair)?: A Lot Help needed standing up from a chair using your arms (e.g., wheelchair or bedside chair)?: A Lot Help needed to walk in hospital room?: Total Help needed climbing 3-5 steps with a railing? : Total 6 Click Score: 11    End of Session Equipment Utilized During Treatment: Gait belt Activity Tolerance: Patient tolerated treatment well;Patient limited by fatigue Patient left: in bed;with call bell/phone within reach;with bed alarm set (no chair in pt's room) Nurse Communication: Mobility status (stool) PT Visit Diagnosis: Unsteadiness on feet (R26.81);Other abnormalities  of gait and mobility (R26.89);History of falling (Z91.81);Muscle weakness (generalized) (M62.81)    Time: 8778-8742 PT Time Calculation (min) (ACUTE ONLY): 36 min   Charges:   PT Evaluation $PT Eval Low Complexity: 1 Low PT Treatments $Therapeutic Activity: 8-22 mins PT General Charges $$ ACUTE PT VISIT: 1 Visit         Izetta Call, PT, DPT   Acute Rehabilitation  Department Office [phone] Secure Chat Communication Preferred  Izetta JULIANNA Call 07/12/2024, 2:13 PM

## 2024-07-12 NOTE — Progress Notes (Addendum)
 Daily Progress Note  DOA: 07/08/2024 Hospital Day: 5  Primary GI: Unassigned Cc:  GI bleed  ASSESSMENT    63 yo male admitted with jejunal SBO ( resolved) and upper GI bleed with associated anemia.  GI bleed due to severe esophagitis caused by worsening GERD in setting of SBO and also possibly laying in flat position at nursing home   Bld transfusions:  1 unit  10/25 TODAY: SBO resolved. Hgb stable at 7.9. BUN down to 24.   GERD Grade D esophagitis As above  Colon cancer screening Negative Cologuard August 2024  AKI Resolved  Bilateral PEs on Eliquis   Hypertension  Parkinson's disease  Hx of subdural hematoma Frequent Falls  PLAN   --Continue BID IV PPI --HOB elevated 30-45 degrees  --Surgery has seen and advanced diet.  -- GI will sign off --Our office will contact him for follow-up appointment.  May need repeat EGD to ensure severe esophagitis healing    Subjective   No physical complaints.  Specifically he has no nausea, vomiting.  No abdominal pain.  Having a lot of flatulence.  No BMs.  He has tolerated clear liquids.  Did not want any today, wanted to advance diet and was waiting on surgery to okay it  Objective   GI Studies:   07/10/24 EGD - LA Grade D reflux esophagitis with no bleeding.  - Small hiatal hernia.  - Normal stomach.  - Normal examined duodenum.  - No specimens collected.   Recent Labs    07/10/24 1454 07/11/24 0315 07/12/24 0414  WBC 6.4 5.6 9.8  HGB 7.4* 7.3* 7.9*  HCT 23.3* 23.3* 25.2*  MCV 82.3 83.5 84.0  PLT 275 275 336   Recent Labs    07/09/24 1036  FOLATE 13.2  VITAMINB12 385  FERRITIN 100  TIBC 220*  IRONPCTSAT NOT CALCULATED   Recent Labs    07/10/24 0552 07/11/24 0315 07/12/24 0414  NA 141 144 143  K 3.0* 4.1 3.4*  CL 110 114* 113*  CO2 20* 19* 21*  GLUCOSE 105* 118* 115*  BUN 43* 28* 24*  CREATININE 1.17 0.97 1.05  CALCIUM  8.4* 8.4* 8.7*   Imaging:  DG Abd Portable 1V EXAM: 1 VIEW  XRAY OF THE ABDOMEN 07/11/2024 10:31:00 AM  COMPARISON: 07/10/2024  CLINICAL HISTORY: SBO (small bowel obstruction)  FINDINGS:  LINES, TUBES AND DEVICES: Enteric tube with side port in distal esophagus.  BOWEL: Improved bowel dilatation compared to previous study. Similar appearance of retained enteric contrast material within the colon and rectum.  SOFT TISSUES: No opaque urinary calculi.  BONES: No acute osseous abnormality.  IMPRESSION: 1. Enteric tube malposition with side port projecting over the distal esophagus; advancement recommended to position side port within the stomach. 2. Improved bowel dilatation compared to prior exam.  Electronically signed by: Waddell Calk MD 07/11/2024 10:57 AM EDT RP Workstation: [id number]     Scheduled inpatient medications:   bisacodyl   10 mg Rectal Daily   pantoprazole  (PROTONIX ) IV  40 mg Intravenous Q12H   sodium chloride  flush  3 mL Intravenous Q12H   Continuous inpatient infusions:  PRN inpatient medications: acetaminophen  **OR** acetaminophen , alum & mag hydroxide-simeth, HYDROmorphone (DILAUDID) injection, magic mouthwash, menthol, methocarbamol (ROBAXIN) injection, naphazoline-glycerin, phenol, prochlorperazine , simethicone, sodium chloride   Vital signs in last 24 hours: Temp:  [97.8 F (36.6 C)-98.8 F (37.1 C)] 97.8 F (36.6 C) (10/27 0530) Pulse Rate:  [91-101] 91 (10/27 0530) Resp:  [13-17] 13 (10/27 0530) BP: (142-161)/(83-91) 161/91 (10/27  0530) SpO2:  [97 %-99 %] 99 % (10/27 0530) Weight:  [103.1 kg] 103.1 kg (10/27 0530) Last BM Date : 07/11/24  Intake/Output Summary (Last 24 hours) at 07/12/2024 0935 Last data filed at 07/11/2024 2025 Gross per 24 hour  Intake 240 ml  Output 500 ml  Net -260 ml    Intake/Output from previous day: 10/26 0701 - 10/27 0700 In: 240 [P.O.:240] Out: 500 [Urine:500] Intake/Output this shift: No intake/output data recorded.   Physical Exam:  General: Alert male  in NAD Heart:  Regular rate and rhythm.  Pulmonary: Normal respiratory effort Abdomen: Soft, nondistended, nontender. Normal bowel sounds. Extremities: No lower extremity edema  Neurologic: Alert and oriented Psych: Pleasant. Cooperative     LOS: 4 days   Vina Dasen ,NP 07/12/2024, 9:35 AM

## 2024-07-12 NOTE — Plan of Care (Signed)

## 2024-07-12 NOTE — Progress Notes (Signed)
 2 Days Post-Op  Subjective: CC: Reports he has no abdominal pain and is tolerating liquids without n/v. Passing flatus. BM yesterday.   Objective: Vital signs in last 24 hours: Temp:  [97.8 F (36.6 C)-98.8 F (37.1 C)] 97.8 F (36.6 C) (10/27 0530) Pulse Rate:  [91-101] 91 (10/27 0530) Resp:  [13-17] 13 (10/27 0530) BP: (142-161)/(83-91) 161/91 (10/27 0530) SpO2:  [97 %-99 %] 99 % (10/27 0530) Weight:  [103.1 kg] 103.1 kg (10/27 0530) Last BM Date : 07/11/24  Intake/Output from previous day: 10/26 0701 - 10/27 0700 In: 240 [P.O.:240] Out: 500 [Urine:500] Intake/Output this shift: No intake/output data recorded.  PE: Gen:  Alert, NAD, pleasant Abd: Soft, ND, NT  Lab Results:  Recent Labs    07/11/24 0315 07/12/24 0414  WBC 5.6 9.8  HGB 7.3* 7.9*  HCT 23.3* 25.2*  PLT 275 336   BMET Recent Labs    07/11/24 0315 07/12/24 0414  NA 144 143  K 4.1 3.4*  CL 114* 113*  CO2 19* 21*  GLUCOSE 118* 115*  BUN 28* 24*  CREATININE 0.97 1.05  CALCIUM  8.4* 8.7*   PT/INR No results for input(s): LABPROT, INR in the last 72 hours. CMP     Component Value Date/Time   NA 143 07/12/2024 0414   NA 137 12/10/2023 1222   K 3.4 (L) 07/12/2024 0414   CL 113 (H) 07/12/2024 0414   CO2 21 (L) 07/12/2024 0414   GLUCOSE 115 (H) 07/12/2024 0414   BUN 24 (H) 07/12/2024 0414   BUN 20 12/10/2023 1222   CREATININE 1.05 07/12/2024 0414   CALCIUM  8.7 (L) 07/12/2024 0414   PROT 6.9 07/08/2024 1608   PROT 6.4 12/10/2023 1222   ALBUMIN 3.2 (L) 07/08/2024 1608   ALBUMIN 4.2 12/10/2023 1222   AST 38 07/08/2024 1608   ALT 6 07/08/2024 1608   ALKPHOS 74 07/08/2024 1608   BILITOT 1.0 07/08/2024 1608   BILITOT 0.7 12/10/2023 1222   GFRNONAA >60 07/12/2024 0414   GFRAA 88 03/13/2020 1531   Lipase     Component Value Date/Time   LIPASE 16 07/08/2024 1608    Studies/Results: DG Abd Portable 1V Result Date: 07/11/2024 EXAM: 1 VIEW XRAY OF THE ABDOMEN 07/11/2024  10:31:00 AM COMPARISON: 07/10/2024 CLINICAL HISTORY: SBO (small bowel obstruction) FINDINGS: LINES, TUBES AND DEVICES: Enteric tube with side port in distal esophagus. BOWEL: Improved bowel dilatation compared to previous study. Similar appearance of retained enteric contrast material within the colon and rectum. SOFT TISSUES: No opaque urinary calculi. BONES: No acute osseous abnormality. IMPRESSION: 1. Enteric tube malposition with side port projecting over the distal esophagus; advancement recommended to position side port within the stomach. 2. Improved bowel dilatation compared to prior exam. Electronically signed by: Waddell Calk MD 07/11/2024 10:57 AM EDT RP Workstation: [id number]   DG Abd 1 View Result Date: 07/10/2024 EXAM: 1 VIEW XRAY OF THE ABDOMEN 07/10/2024 04:53:00 PM COMPARISON: Abdominal x-ray dated 07/10/2024. CLINICAL HISTORY: [id number] Encounter for orogastric (OG) tube placement [id number]. NG tube placement Encounter for orogastric (OG) tube placement [id number]. NG tube placement FINDINGS: LINES, TUBES AND DEVICES: Orogastric tube tip is in the mid stomach with cuff at the level of the gastroesophageal junction. BOWEL: Nonobstructive bowel gas pattern. SOFT TISSUES: No opaque urinary calculi. BONES: No acute osseous abnormality. IMPRESSION: 1. Orogastric tube tip is in the mid stomach with the side hole at the level of the gastroesophageal junction. Recommended advancing tube 5 cm. Electronically signed by: Greig Pique MD  07/10/2024 05:22 PM EDT RP Workstation: [id number]    Anti-infectives: Anti-infectives (From admission, onward)    Start     Dose/Rate Route Frequency Ordered Stop   07/09/24 0600  piperacillin-tazobactam (ZOSYN) IVPB 3.375 g  Status:  Discontinued       Placed in Followed by Linked Group   3.375 g 12.5 mL/hr over 240 Minutes Intravenous Every 8 hours 07/08/24 2033 07/09/24 0825   07/08/24 2045  piperacillin-tazobactam (ZOSYN) IVPB 3.375 g       Placed in Followed  by Linked Group   3.375 g 100 mL/hr over 30 Minutes Intravenous  Once 07/08/24 2033 07/08/24 2146        Assessment/Plan SBO - Clinically and radiographically resolving. He has contrast on his xray 10/26. He is tolerating a po without abdominal pain, n/v and has return of bowel function. His abdomen is soft and NT. He can advance his diet as tolerated from our standpoint. General surgery will sign off. Please call back with any questions or concerns. Message sent to [facility].   Melena - per GI, EGD 10/25 with esophagitis and small HH, no active bleeding lesions   FEN: FLD, ADAT, IVF per primary VTE: SCDs ID: no current abx  I reviewed nursing notes, hospitalist notes, last 24 h vitals and pain scores, last 48 h intake and output, last 24 h labs and trends, and last 24 h imaging results.    LOS: 4 days    Ozell CHRISTELLA Shaper, [facility] Surgery 07/12/2024, 10:43 AM Please see Amion for pager number during day hours 7:00am-4:30pm

## 2024-07-12 NOTE — NC FL2 (Cosign Needed Addendum)
 [place]  MEDICAID FL2 LEVEL OF CARE FORM     IDENTIFICATION  Patient Name: John [name] Birthdate: 12/12/1960 Sex: male Admission Date (Current Location): 07/08/2024  [facility] and [place] Number:  Producer, Television/film/video and Address:  [facility] [place], [address] [place], [place] [address]      Provider Number: [id number]  Attending Physician Name and Address:  Drusilla Sabas RAMAN, MD  Relative Name and Phone Number:  Yaser Harvill (sister) [phone]    Current Level of Care: Hospital Recommended Level of Care: Skilled Nursing Facility Prior Approval Number:    Date Approved/Denied:   PASRR Number: [id number]  Discharge Plan: SNF    Current Diagnoses: Patient Active Problem List   Diagnosis Date Noted   Melena 07/11/2024   ABLA (acute blood loss anemia) 07/11/2024   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   History of pulmonary embolus (PE) 07/09/2024   Chronic anticoagulation 07/09/2024   SBO (small bowel obstruction) (HCC) 07/08/2024   GI bleeding 07/08/2024   Syncope 07/08/2024   Acute cholecystitis 07/08/2024   Pericardial effusion 07/08/2024   Mild neurocognitive disorder due to multiple etiologies 06/24/2024   Cervical myelopathy    Frequent falls 03/24/2024   Acute metabolic encephalopathy 03/24/2024   Hypomagnesemia 03/24/2024   BPH with urinary obstruction 03/24/2024   Obesity (BMI 30-39.9) 03/24/2024   AKI (acute kidney injury) 03/16/2024   Bilateral pulmonary embolism 03/16/2024   Normocytic anemia 03/16/2024   Lactic acidosis 03/16/2024   Hypokalemia 03/10/2024   Hyponatremia 03/10/2024   Lateral epicondylitis of right elbow 03/10/2024   Pain in left knee 02/16/2024   Neck pain 12/30/2023   Tinnitus of both ears 12/10/2023   Parkinson's disease with fluctuating manifestations 12/02/2022   Impairment of balance 03/12/2022   Lymphedema 03/12/2022   Essential hypertension 10/29/2021   Gastroesophageal reflux disease without  esophagitis 10/29/2021   Pain of lumbar spine 08/21/2020    Orientation RESPIRATION BLADDER Height & Weight     Self  Normal Incontinent Weight: 227 lb 4.7 oz (103.1 kg) Height:  6' 2 (188 cm)  BEHAVIORAL SYMPTOMS/MOOD NEUROLOGICAL BOWEL NUTRITION STATUS      Incontinent Diet (Please see discharge summary)  AMBULATORY STATUS COMMUNICATION OF NEEDS Skin   Extensive Assist Verbally Other (Comment) (WDL,Wound/Incision LDAs)                       Personal Care Assistance Level of Assistance  Bathing, Feeding, Dressing Bathing Assistance: Limited assistance Feeding Assistance: Independent  Dressing Assistance: Limited assistance     Functional Limitations Info  Sight, Hearing, Speech Sight Info: Adequate Hearing Info: Adequate Speech Info: Adequate    SPECIAL CARE FACTORS FREQUENCY  PT (By licensed PT), OT (By licensed OT)     PT Frequency: 5x min weekly OT Frequency: 5x min weekly            Contractures Contractures Info: Not present    Additional Factors Info  Code Status, Allergies Code Status Info: FULL Allergies Info: Flomax  (tamsulosin  Hcl)           Current Medications (07/12/2024):  This is the current hospital active medication list Current Facility-Administered Medications  Medication Dose Route Frequency Provider Last Rate Last Admin   acetaminophen  (TYLENOL ) tablet 650 mg  650 mg Oral Q6H PRN Legrand Victory CROME III, MD       Or   acetaminophen  (TYLENOL ) suppository 650 mg  650 mg Rectal Q6H PRN Legrand Victory CROME DOUGLAS, MD  alum & mag hydroxide-simeth (MAALOX/MYLANTA) 200-200-20 MG/5ML suspension 30 mL  30 mL Oral Q6H PRN Legrand Victory CROME III, MD       bisacodyl  (DULCOLAX) suppository 10 mg  10 mg Rectal Daily Legrand Victory CROME III, MD   10 mg at 07/12/24 1115   carbidopa -levodopa  (SINEMET  CR) 50-200 MG per tablet controlled release 1 tablet  1 tablet Oral 6 times per day Drusilla Sabas RAMAN, MD   1 tablet at 07/12/24 1450   HYDROmorphone (DILAUDID) injection  0.5 mg  0.5 mg Intravenous Q3H PRN Legrand Victory CROME III, MD   0.5 mg at 07/11/24 2029   magic mouthwash  15 mL Oral QID PRN Legrand Victory CROME DOUGLAS, MD       menthol (CEPACOL) lozenge 3 mg  1 lozenge Oral PRN Legrand Victory CROME DOUGLAS, MD       methocarbamol (ROBAXIN) injection 1,000 mg  1,000 mg Intravenous Q6H PRN Legrand Victory CROME DOUGLAS, MD       naphazoline-glycerin (CLEAR EYES REDNESS) ophth solution 1-2 drop  1-2 drop Both Eyes QID PRN Legrand Victory CROME III, MD       pantoprazole  (PROTONIX ) injection 40 mg  40 mg Intravenous Q12H Legrand Victory CROME III, MD   40 mg at 07/12/24 1115   phenol (CHLORASEPTIC) mouth spray 2 spray  2 spray Mouth/Throat PRN Legrand Victory CROME III, MD       prochlorperazine  (COMPAZINE ) injection 5-10 mg  5-10 mg Intravenous Q4H PRN Legrand Victory CROME III, MD   5 mg at 07/11/24 0658   simethicone (MYLICON) 40 MG/0.6ML suspension 80 mg  80 mg Oral QID PRN Legrand Victory CROME DOUGLAS, MD       sodium chloride  (OCEAN) 0.65 % nasal spray 1-2 spray  1-2 spray Each Nare Q6H PRN Legrand Victory CROME III, MD       sodium chloride  flush (NS) 0.9 % injection 3 mL  3 mL Intravenous Q12H Legrand Victory CROME III, MD   3 mL at 07/12/24 1115     Discharge Medications: Please see discharge summary for a list of discharge medications.  Relevant Imaging Results:  Relevant Lab Results:   Additional Information [id number]  Isaiah Public, LCSWA

## 2024-07-13 DIAGNOSIS — D62 Acute posthemorrhagic anemia: Secondary | ICD-10-CM | POA: Diagnosis not present

## 2024-07-13 DIAGNOSIS — K2210 Ulcer of esophagus without bleeding: Secondary | ICD-10-CM | POA: Diagnosis not present

## 2024-07-13 DIAGNOSIS — K921 Melena: Secondary | ICD-10-CM | POA: Diagnosis not present

## 2024-07-13 DIAGNOSIS — K56609 Unspecified intestinal obstruction, unspecified as to partial versus complete obstruction: Secondary | ICD-10-CM | POA: Diagnosis not present

## 2024-07-13 LAB — CBC
HCT: 27.8 % — ABNORMAL LOW (ref 39.0–52.0)
Hemoglobin: 8.6 g/dL — ABNORMAL LOW (ref 13.0–17.0)
MCH: 25.7 pg — ABNORMAL LOW (ref 26.0–34.0)
MCHC: 30.9 g/dL (ref 30.0–36.0)
MCV: 83.2 fL (ref 80.0–100.0)
Platelets: 415 K/uL — ABNORMAL HIGH (ref 150–400)
RBC: 3.34 MIL/uL — ABNORMAL LOW (ref 4.22–5.81)
RDW: 16.1 % — ABNORMAL HIGH (ref 11.5–15.5)
WBC: 9.7 K/uL (ref 4.0–10.5)
nRBC: 0 % (ref 0.0–0.2)

## 2024-07-13 LAB — COMPREHENSIVE METABOLIC PANEL WITH GFR
ALT: 7 U/L (ref 0–44)
AST: 21 U/L (ref 15–41)
Albumin: 2.8 g/dL — ABNORMAL LOW (ref 3.5–5.0)
Alkaline Phosphatase: 66 U/L (ref 38–126)
Anion gap: 7 (ref 5–15)
BUN: 22 mg/dL (ref 8–23)
CO2: 23 mmol/L (ref 22–32)
Calcium: 8.6 mg/dL — ABNORMAL LOW (ref 8.9–10.3)
Chloride: 113 mmol/L — ABNORMAL HIGH (ref 98–111)
Creatinine, Ser: 1.08 mg/dL (ref 0.61–1.24)
GFR, Estimated: >60 mL/min (ref >60)
Glucose, Bld: 126 mg/dL — ABNORMAL HIGH (ref 70–99)
Potassium: 3.5 mmol/L (ref 3.5–5.1)
Sodium: 143 mmol/L (ref 135–145)
Total Bilirubin: 0.3 mg/dL (ref 0.0–1.2)
Total Protein: 5.7 g/dL — ABNORMAL LOW (ref 6.5–8.1)

## 2024-07-13 MED ORDER — BISACODYL 10 MG RE SUPP
10.0000 mg | Freq: Every day | RECTAL | Status: DC | PRN
  Administered 2024-07-13: 10 mg via RECTAL
  Filled 2024-07-13: qty 1

## 2024-07-13 MED ORDER — PANTOPRAZOLE SODIUM 40 MG PO TBEC
40.0000 mg | DELAYED_RELEASE_TABLET | Freq: Two times a day (BID) | ORAL | Status: DC
  Administered 2024-07-13 – 2024-07-15 (×5): 40 mg via ORAL
  Filled 2024-07-13 (×5): qty 1

## 2024-07-13 MED ORDER — POLYETHYLENE GLYCOL 3350 17 G PO PACK
17.0000 g | PACK | Freq: Every day | ORAL | Status: DC
  Administered 2024-07-13 – 2024-07-14 (×2): 17 g via ORAL
  Filled 2024-07-13 (×3): qty 1

## 2024-07-13 MED ORDER — ENSURE PLUS HIGH PROTEIN PO LIQD
237.0000 mL | Freq: Two times a day (BID) | ORAL | Status: DC
  Administered 2024-07-13 – 2024-07-15 (×4): 237 mL via ORAL

## 2024-07-13 NOTE — Progress Notes (Signed)
   Arrival Method: transfer from 6 east via stretcher Mental Orientation: alert x 2 Telemetry: box 4 Assessment: Completed Skin: see flowsheet IV: nsl Pain:  none Tubes: none Safety Measures: Safety Fall Prevention Plan has been discussed Admission: Completed [facility] Orientation: Patient has been orientated to the room, unit and staff.  Family: none at bedside  Orders have been reviewed and implemented. Will continue to monitor the patient. Call light has been placed within reach and bed alarm has been activated.   Channing Na BSN, RN Phone number: [phone]

## 2024-07-13 NOTE — Progress Notes (Addendum)
 [facility]  PROGRESS NOTE  John [name] FMW:[id number] DOB: 1960-10-17 DOA: 07/08/2024 PCP: John [name], MD   Brief HPI:    63 y.o. male with medical history significant for Parkinson disease, mild cognitive impairment, PE in June 2025 on Eliquis , and BPH who presents with dark tarry stool, dark emesis, and abdominal pain.   Patient reports that he developed abdominal pain yesterday.  This has been associated with nausea, loose dark, tarry stools, and then dark emesis today.    Assessment/Plan:    Small bowel obstruction -Resolved - General Surgery consulted, NG tube placed; removed after resolution of SBO - Abdominal x-ray from 10/26 showed contrast in colon - Diet advanced to full liquid.  Advance as tolerated - General Surgery has signed off  Dark tarry stools/melena - Gastroenterology consulted; underwent EGD on 10/25 which showed esophagitis with no bleeding -Recommend CT angiogram abdomen/pelvis if further episodes of bleeding - Patient was started on Eliquis  in July of this year for pulmonary volume, was taken off briefly due to subdural hematoma.  Now Eliquis  on hold. - Gastroenterology recommends to discontinue Eliquis  altogether -Continue Protonix  40 mg p.o. twice daily --HOB elevated 30-45 degrees  - Follow-up gastroenterology as outpatient  Anemia -Due to above - Hemoglobin dropped to 6.8, status post 1 unit PRBC - Today hemoglobin is 8.6   Syncope - Patient had syncope upon standing from bedside commode in ED - He has history of orthostatic hypotension, also hypovolemic in setting of nausea vomiting and diarrhea and GI bleed - Continue IV hydration, transfuse PRBC as needed  Acute kidney injury - Serum creatinine 1.70 on admission, it was 1.05  2 days earlier - No hydronephrosis seen on CT abdomen/pelvis - Likely prerenal in setting of nausea vomiting and GI bleed as above - Started on IV hydration, creatinine has improved to 0.97  History  of pulmonary embolism - Had pulmonary embolism in June 2025 - Patient was started on Eliquis  on 03/16/2024; he was supposed to be on Eliquis  for 3 months for pulmonary embolism - Now presenting with GI bleed - Gastroenterology recommends stopping Eliquis ; called and confirmed with patient's sister on phone.  He has already received Eliquis  for more than 3 months; he also had subdural hematoma in July.  I will discontinue Eliquis  at this time.  Parkinson disease, mild cognitive impairment - Sinemet  has been restarted  Hypokalemia - Replete  Hypophosphatemia - Replete  Pericardial effusion - Small pericardial effusion noted on CT in the ED - Echocardiogram showed EF 75%, grade 1 diastolic dysfunction; also showed small pericardial effusion - Also had trivial pericardial effusion on echo obtained in June 2025 -Can follow-up with cardiology as outpatient        DVT prophylaxis: SCDs  Medications     bisacodyl   10 mg Rectal Daily   carbidopa -levodopa   1 tablet Oral 6 times per day   pantoprazole   40 mg Oral BID   sodium chloride  flush  3 mL Intravenous Q12H     Data Reviewed:   CBG:  Recent Labs  Lab 07/08/24 1702  GLUCAP 223*    SpO2: 100 %    Vitals:   07/13/24 0005 07/13/24 0007 07/13/24 0444 07/13/24 0804  BP: 128/82 125/82 (!) 139/90 (!) 139/96  Pulse: 96 (!) 101 86 97  Resp:  20 20 18   Temp: 98.5 F (36.9 C) 98.5 F (36.9 C) 97.7 F (36.5 C) 97.9 F (36.6 C)  TempSrc:      SpO2:  99% 98%  100%  Weight:      Height:          Data Reviewed:  Basic Metabolic Panel: Recent Labs  Lab 07/08/24 1608 07/08/24 1719 07/09/24 1444 07/10/24 0552 07/11/24 0315 07/12/24 0414 07/13/24 0455  NA 137   < > 142 141 144 143 143  K 3.0*   < > 3.0* 3.0* 4.1 3.4* 3.5  CL 102   < > 113* 110 114* 113* 113*  CO2 16*   < > 20* 20* 19* 21* 23  GLUCOSE 197*   < > 118* 105* 118* 115* 126*  BUN 53*   < > 58* 43* 28* 24* 22  CREATININE 1.70*   < > 1.38* 1.17 0.97  1.05 1.08  CALCIUM  8.7*   < > 8.1* 8.4* 8.4* 8.7* 8.6*  MG 2.0  --   --  1.9  --   --   --   PHOS  --   --   --  1.3* 3.8  --   --    < > = values in this interval not displayed.    CBC: Recent Labs  Lab 07/08/24 1608 07/08/24 1719 07/10/24 0855 07/10/24 1454 07/11/24 0315 07/12/24 0414 07/13/24 0455  WBC 12.4*   < > 6.8 6.4 5.6 9.8 9.7  NEUTROABS 8.1*  --   --   --   --   --   --   HGB 9.5*   < > 6.8* 7.4* 7.3* 7.9* 8.6*  HCT 31.3*   < > 22.1* 23.3* 23.3* 25.2* 27.8*  MCV 83.7   < > 83.1 82.3 83.5 84.0 83.2  PLT 363   < > 311 275 275 336 415*   < > = values in this interval not displayed.    LFT Recent Labs  Lab 07/08/24 1608 07/13/24 0455  AST 38 21  ALT 6 7  ALKPHOS 74 66  BILITOT 1.0 0.3  PROT 6.9 5.7*  ALBUMIN 3.2* 2.8*     Antibiotics: Anti-infectives (From admission, onward)    Start     Dose/Rate Route Frequency Ordered Stop   07/09/24 0600  piperacillin-tazobactam (ZOSYN) IVPB 3.375 g  Status:  Discontinued       Placed in Followed by Linked Group   3.375 g 12.5 mL/hr over 240 Minutes Intravenous Every 8 hours 07/08/24 2033 07/09/24 0825   07/08/24 2045  piperacillin-tazobactam (ZOSYN) IVPB 3.375 g       Placed in Followed by Linked Group   3.375 g 100 mL/hr over 30 Minutes Intravenous  Once 07/08/24 2033 07/08/24 2146        CONSULTS general surgery  Code Status: Full code  Family Communication: Discussed with patient's sister on phone     Subjective   Patient seen and examined, SBO has resolved.  Objective    Physical Examination:   Appears in no acute distress S1-S2, regular, no murmur auscultated Abdomen is soft, nontender, no organomegaly Extremities no edema  Status is: Inpatient:             John [name]   [facility] If 7PM-7AM, please contact night-coverage at [email or web address], Office  [phone]   07/13/2024, 8:22 AM  LOS: 5 days

## 2024-07-13 NOTE — Evaluation (Signed)
 Occupational Therapy Evaluation Patient Details Name: John [name] MRN: [id number] DOB: 08/13/1961 Today's Date: 07/13/2024   History of Present Illness   The pt is a 63 yo male presenting 10/23 from [facility] with tarry stools and dark emesis. Work up revealed SBO. S/p upper endoscopy 10/25. PMH includes: PE, cervical myelopathy, HTN, GERD, frequent falls, SDH, UTI, seizure, and Parkinson's.     Clinical Impressions Pt resting in bed comfortably, no complaints, eager to participate. Pt from ST stay at [facility], plans to return for ST stay. PLOF using w/c primarily, ambulating with therapy using RW. Pt currently requires min a for LB ADLs due to poor balance and posterior lean when standing, min A to maintain standing balance. Pt able to complete UB dressing and don/doff socks with set up. Mod A for assisting to EOB, increased time. Pt with slight inattention to L side with activities, but able to complete activities on L side with increased time, fair vision and able to visually scan to complete tasks, some lower left field vision deficits. Pt able to stand pivot transfer from recliner to bed with min A using RW. Pt would benefit from continued acute OT to maximize functional strength and safety with upright ADLs, return to postacute rehab <3hrs/day recommended.      If plan is discharge home, recommend the following:   A little help with walking and/or transfers;A little help with bathing/dressing/bathroom;Assistance with cooking/housework;Assist for transportation;Help with stairs or ramp for entrance     Functional Status Assessment   Patient has had a recent decline in their functional status and demonstrates the ability to make significant improvements in function in a reasonable and predictable amount of time.     Equipment Recommendations   None recommended by OT     Recommendations for Other Services         Precautions/Restrictions    Precautions Precautions: Fall Recall of Precautions/Restrictions: Intact Restrictions Weight Bearing Restrictions Per Provider Order: No     Mobility Bed Mobility Overal bed mobility: Needs Assistance Bed Mobility: Supine to Sit, Sit to Supine     Supine to sit: Mod assist, HOB elevated, Used rails Sit to supine: Min assist, Used rails   General bed mobility comments: mod to assist to EOB, min A back to bed, increased time    Transfers Overall transfer level: Needs assistance Equipment used: Rolling walker (2 wheels) Transfers: Sit to/from Stand, Bed to chair/wheelchair/BSC Sit to Stand: Min assist, From elevated surface     Step pivot transfers: Min assist     General transfer comment: min a for STS and pivot transfer, posterior lean requirining min A to maintain balance      Balance Overall balance assessment: Needs assistance Sitting-balance support: Feet supported Sitting balance-Leahy Scale: Fair Sitting balance - Comments: EOB ADLs Postural control: Posterior lean Standing balance support: Bilateral upper extremity supported, During functional activity, Reliant on assistive device for balance Standing balance-Leahy Scale: Poor Standing balance comment: min A with RW to maintain balance, posterior lean                           ADL either performed or assessed with clinical judgement   ADL Overall ADL's : Needs assistance/impaired Eating/Feeding: Independent   Grooming: Set up;Sitting   Upper Body Bathing: Set up;Sitting   Lower Body Bathing: Minimal assistance;Sitting/lateral leans   Upper Body Dressing : Set up;Sitting   Lower Body Dressing: Minimal assistance;Sitting/lateral leans;Sit to/from stand  Toilet Transfer: Minimal assistance;Rolling walker (2 wheels);BSC/3in1   Toileting- Clothing Manipulation and Hygiene: Minimal assistance       Functional mobility during ADLs: Minimal assistance;Rolling walker (2 wheels) General ADL  Comments: Pt min A for LB dressing due to LOB when standing, min A to maintain balance using RW, posterior lean.     Vision Patient Visual Report: Other (comment) (difficult to assess, mild inattention to L side, mostly lower left vision)       Perception         Praxis         Pertinent Vitals/Pain Pain Assessment Pain Assessment: No/denies pain     Extremity/Trunk Assessment Upper Extremity Assessment Upper Extremity Assessment: Overall WFL for tasks assessed;RUE deficits/detail;LUE deficits/detail RUE Deficits / Details: WFLs, able to complete ADLs, but with decreased depth perception decreasing coordination RUE Sensation: WNL RUE Coordination: decreased gross motor LUE Deficits / Details: WFLs, able to complete ADLs, but with decreased depth perception decreasing coordination LUE Sensation: WNL LUE Coordination: decreased gross motor           Communication Communication Communication: No apparent difficulties   Cognition Arousal: Alert Behavior During Therapy: WFL for tasks assessed/performed Cognition: No family/caregiver present to determine baseline             OT - Cognition Comments: Pt overall grossly A/O x4, when asked about a few months ago Pt stated he did not remember if he was at [facility] a few months ago or not. Mild inattention to the L side with vision, LUE/LLE function slower than R at times                 Following commands: Intact       Cueing  General Comments   Cueing Techniques: Verbal cues      Exercises     Shoulder Instructions      Home Living Family/patient expects to be discharged to:: Skilled nursing facility                                 Additional Comments: [name]      Prior Functioning/Environment Prior Level of Function : Needs assist             Mobility Comments: reports using WC in facility, but states RW when asked what DME he uses most often. reports no falls since  [facility], working with PT there on balance ADLs Comments: pt reports independence, working with OT at [facility]. assist as needed    OT Problem List: Decreased strength;Impaired balance (sitting and/or standing);Decreased coordination;Decreased cognition;Decreased safety awareness   OT Treatment/Interventions: Self-care/ADL training;Therapeutic exercise;Energy conservation;DME and/or AE instruction;Therapeutic activities;Patient/family education;Balance training      OT Goals(Current goals can be found in the care plan section)   Acute Rehab OT Goals Patient Stated Goal: to return to SNF OT Goal Formulation: With patient Time For Goal Achievement: 07/27/24 Potential to Achieve Goals: Good   OT Frequency:  Min 2X/week    Co-evaluation              AM-PAC OT 6 Clicks Daily Activity     Outcome Measure Help from another person eating meals?: None Help from another person taking care of personal grooming?: A Little Help from another person toileting, which includes using toliet, bedpan, or urinal?: A Little Help from another person bathing (including washing, rinsing, drying)?: A Little Help from another person to put on and  taking off regular upper body clothing?: A Little Help from another person to put on and taking off regular lower body clothing?: A Little 6 Click Score: 19   End of Session Equipment Utilized During Treatment: Gait belt;Rolling walker (2 wheels) Nurse Communication: Mobility status  Activity Tolerance: Patient tolerated treatment well Patient left: in bed;with call bell/phone within reach;with bed alarm set  OT Visit Diagnosis: Unsteadiness on feet (R26.81);Other abnormalities of gait and mobility (R26.89);Muscle weakness (generalized) (M62.81);Other symptoms and signs involving cognitive function                Time: 8784-8757 OT Time Calculation (min): 27 min Charges:  OT General Charges $OT Visit: 1 Visit OT Evaluation $OT Eval Low Complexity: 1  Low OT Treatments $Self Care/Home Management : 8-22 mins  [place], OTR/L   Elouise JONELLE Bott 07/13/2024, 12:54 PM

## 2024-07-14 DIAGNOSIS — K56609 Unspecified intestinal obstruction, unspecified as to partial versus complete obstruction: Secondary | ICD-10-CM | POA: Diagnosis not present

## 2024-07-14 MED ORDER — ONDANSETRON HCL 4 MG/2ML IJ SOLN
4.0000 mg | Freq: Four times a day (QID) | INTRAMUSCULAR | Status: DC | PRN
Start: 2024-07-14 — End: 2024-07-15

## 2024-07-14 MED ORDER — HYDROCODONE-ACETAMINOPHEN 5-325 MG PO TABS: 1.0000 | ORAL_TABLET | Freq: Four times a day (QID) | ORAL | Status: DC | PRN

## 2024-07-14 MED ORDER — METHOCARBAMOL 500 MG PO TABS: 500.0000 mg | ORAL_TABLET | Freq: Three times a day (TID) | ORAL | Status: DC | PRN

## 2024-07-14 MED ORDER — SENNOSIDES-DOCUSATE SODIUM 8.6-50 MG PO TABS
1.0000 | ORAL_TABLET | Freq: Two times a day (BID) | ORAL | Status: DC
  Administered 2024-07-14 – 2024-07-15 (×3): 1 via ORAL
  Filled 2024-07-14 (×3): qty 1

## 2024-07-14 MED ORDER — PROCHLORPERAZINE EDISYLATE 10 MG/2ML IJ SOLN: 5.0000 mg | INTRAMUSCULAR | Status: DC | PRN

## 2024-07-14 NOTE — TOC Progression Note (Addendum)
 Transition of Care [facility]) - Progression Note    Patient Details  Name: John [name] MRN: [id number] Date of Birth: [birth year]  [facility]) CM/SW Contact  Lendia Dais, [place] Phone Number: 07/14/2024, 2:11 PM  Clinical Narrative:  CSW spoke to Logan and auth is approved. Logan stated that [name] could take the pt tomorrow. MD notified via secure chat.   Shara ID is [id number] valid from 10/28-10/31  CSW will continue to monitor for dc.    Expected Discharge Plan: Skilled Nursing Facility Barriers to Discharge: Continued Medical Work up               Expected Discharge Plan and Services In-house Referral: Clinical Social Work     Living arrangements for the past 2 months:  (from [name] short term rehab)                                       Social Drivers of Health (SDOH) Interventions SDOH Screenings   Food Insecurity: No Food Insecurity (07/11/2024)  Housing: Low Risk  (07/11/2024)  Transportation Needs: No Transportation Needs (07/11/2024)  Utilities: Not At Risk (07/11/2024)  Depression (PHQ2-9): Low Risk  (12/10/2023)  Tobacco Use: Medium Risk (07/08/2024)    Readmission Risk Interventions     No data to display

## 2024-07-14 NOTE — Progress Notes (Signed)
 [facility] Progress Note Patient: John [name] FMW:[id number] DOB: Feb 17, 1961  DOA: 07/08/2024 DOS: the patient was seen and examined on 07/14/2024  Brief Hospital Course: 63 y.o. male with medical history significant for Parkinson disease, mild cognitive impairment, PE in June 2025 on Eliquis , and BPH who presents with dark tarry stool, dark emesis, and abdominal pain. GI and general surgery were consulted. Treated for small bowel obstruction. Underwent EGD which showed esophagitis.  H&H relatively stable.  Anticoagulation on hold upon discharge.   Assessment and plan. Small bowel obstruction Presented with intractable nausea and vomiting. Treated conservatively. General Surgery consulted, NG tube placed; removed after resolution of SBO Abdominal x-ray from 10/26 showed contrast in colon Tolerating dysphagia 3 diet. General Surgery has signed off   Dark tarry stools/melena Esophagitis. Gastroenterology consulted; underwent EGD on 10/25 which showed esophagitis with no bleeding Recommend CT angiogram abdomen/pelvis if further episodes of bleeding Gastroenterology recommends to discontinue Eliquis  altogether Continue Protonix  40 mg p.o. twice daily Follow-up gastroenterology as outpatient  History of PE Diagnosed in June 2025. Anticoagulation was recommended. Was started on Eliquis .  Now discontinued as mostly acute phase is over for anticoagulation and patient had 2 separate life-threatening bleeding episodes.  SDH and June 2025 and GI bleed in October 2025. Gastroenterology recommends stopping Eliquis ; called and confirmed with patient's sister on phone.   Acute blood loss anemia  Hemoglobin dropped to 6.8, status post 1 unit PRBC H&H relatively stable.  Syncope Patient had syncope upon standing from bedside commode in ED He has history of orthostatic hypotension, also hypovolemic in setting of nausea vomiting and diarrhea and GI bleed Treated with IV  hydration. Currently improving.   Acute kidney injury Serum creatinine 1.70 on admission.  Baseline 1.0. No hydronephrosis seen on CT abdomen/pelvis Likely prerenal in setting of nausea vomiting and GI bleed as above Treated with IV fluid.  Parkinson disease, mild cognitive impairment Sinemet  has been restarted   Hypokalemia Replete   Hypophosphatemia Replete   Pericardial effusion Small pericardial effusion noted on CT in the ED Echocardiogram showed EF 75%, grade 1 diastolic dysfunction; also showed small pericardial effusion Also had trivial pericardial effusion on echo obtained in June 2025 Can follow-up with cardiology as outpatient  Subjective: Denies any acute complaint.  No nausea no vomiting.  Wants some real food.  Had a BM.  Passing gas.  Physical Exam: Clear to auscultation. S1-S2 present.  Bowel sound present. Nontender. No edema.  Data Reviewed: I have Reviewed nursing notes, Vitals, and Lab results. Since last encounter, pertinent lab results CBC and BMP   . I have ordered test including BMP  .   Disposition: Status is: Inpatient Remains inpatient appropriate because: Awaiting placement.  SCDs Start: 07/08/24 2031   Family Communication: No one at bedside Level of care: Telemetry Medical   Vitals:   07/13/24 1648 07/13/24 2053 07/14/24 0809 07/14/24 1723  BP: 131/76 114/76 115/75 97/66  Pulse: 92 93 91 100  Resp: 16 20 17 18   Temp: (!) 97.3 F (36.3 C) 97.6 F (36.4 C) 98.3 F (36.8 C) 98.1 F (36.7 C)  TempSrc: Oral  Oral   SpO2: 100% 98% 96% 96%  Weight:      Height:         Author: Yetta Blanch, MD 07/14/2024 5:39 PM  Please look on [email or web address] to find out who is on call.

## 2024-07-15 ENCOUNTER — Encounter: Payer: Self-pay | Admitting: Physician Assistant

## 2024-07-15 DIAGNOSIS — K56609 Unspecified intestinal obstruction, unspecified as to partial versus complete obstruction: Secondary | ICD-10-CM | POA: Diagnosis not present

## 2024-07-15 LAB — BASIC METABOLIC PANEL WITH GFR
Anion gap: 11 (ref 5–15)
BUN: 16 mg/dL (ref 8–23)
CO2: 22 mmol/L (ref 22–32)
Calcium: 8.4 mg/dL — ABNORMAL LOW (ref 8.9–10.3)
Chloride: 106 mmol/L (ref 98–111)
Creatinine, Ser: 1.01 mg/dL (ref 0.61–1.24)
GFR, Estimated: >60 mL/min (ref >60)
Glucose, Bld: 101 mg/dL — ABNORMAL HIGH (ref 70–99)
Potassium: 3.7 mmol/L (ref 3.5–5.1)
Sodium: 139 mmol/L (ref 135–145)

## 2024-07-15 LAB — MAGNESIUM: Magnesium: 1.7 mg/dL (ref 1.7–2.4)

## 2024-07-15 MED ORDER — BISACODYL 10 MG RE SUPP: 10.0000 mg | Freq: Every day | RECTAL | 0 refills | Status: AC | PRN

## 2024-07-15 MED ORDER — PANTOPRAZOLE SODIUM 40 MG PO TBEC: 40.0000 mg | DELAYED_RELEASE_TABLET | Freq: Two times a day (BID) | ORAL | 0 refills | Status: DC

## 2024-07-15 MED ORDER — HYDROCODONE-ACETAMINOPHEN 5-325 MG PO TABS: 1.0000 | ORAL_TABLET | Freq: Three times a day (TID) | ORAL | 0 refills | Status: AC | PRN

## 2024-07-15 MED ORDER — ORAL CARE MOUTH RINSE: 15.0000 mL | OROMUCOSAL | Status: DC

## 2024-07-15 MED ORDER — ENSURE PLUS HIGH PROTEIN PO LIQD: 237.0000 mL | Freq: Two times a day (BID) | ORAL | Status: AC

## 2024-07-15 MED ORDER — POLYETHYLENE GLYCOL 3350 17 G PO PACK: 17.0000 g | PACK | Freq: Every day | ORAL | 0 refills | Status: AC

## 2024-07-15 MED ORDER — ORAL CARE MOUTH RINSE
15.0000 mL | OROMUCOSAL | Status: DC | PRN
Start: 2024-07-15 — End: 2024-07-15

## 2024-07-15 NOTE — TOC Transition Note (Addendum)
 Transition of Care [facility]) - Discharge Note   Patient Details  Name: John [name] MRN: [id number] Date of Birth: 1961-06-18  [facility]) CM/SW Contact:  Lendia Dais, LCSWA Phone Number: 07/15/2024, 1:09 PM   Clinical Narrative: Pt is returning to [place], [place] report to [phone].  CSW spoke to [name] pt's sister and informed her of the pt discharging back to [name]. [name] was agreeable and expressed gratitude.  No further TOC needs.    Final next level of care: Skilled Nursing Facility Barriers to Discharge: Barriers Resolved   Patient Goals and CMS Choice     Choice offered to / list presented to : Sibling (sister [name])      Discharge Placement              Patient chooses bed at: [facility] Patient to be transferred to facility by: [facility] Name of family member notified: [name] (sister) Patient and family notified of of transfer: 07/15/24  Discharge Plan and Services Additional resources added to the After Visit Summary for   In-house Referral: Clinical Social Work                                   Social Drivers of Health (SDOH) Interventions SDOH Screenings   Food Insecurity: No Food Insecurity (07/11/2024)  Housing: Low Risk  (07/11/2024)  Transportation Needs: No Transportation Needs (07/11/2024)  Utilities: Not At Risk (07/11/2024)  Depression (PHQ2-9): Low Risk  (12/10/2023)  Tobacco Use: Medium Risk (07/08/2024)     Readmission Risk Interventions     No data to display

## 2024-07-15 NOTE — Discharge Summary (Signed)
 Physician Discharge Summary   Patient: John [name] MRN: [id number] DOB: 07-06-1961  Admit date:     07/08/2024  Discharge date: 07/15/24  Discharge Physician: Yetta Blanch  PCP: [name], MD  Recommendations at discharge: Follow up with PCP in 1 week Follow up with Gastroenterology as recommended   Follow-up Information     [name], MD. Schedule an appointment as soon as possible for a visit in 1 week(s).   Specialty: Family Medicine Why: with BMP lab to look at kidney/electrolyte numbers, with CBC lab to look at blood counts Contact information: [address] [place] [address] [phone]         [facility] Gastroenterology. Schedule an appointment as soon as possible for a visit in 1 month(s).   Specialty: Gastroenterology Contact information: [address] [place]  [address] [phone]               Hospital Course: 63 y.o. male with medical history significant for Parkinson disease, mild cognitive impairment, PE in June 2025 on Eliquis , and BPH who presents with dark tarry stool, dark emesis, and abdominal pain. GI and general surgery were consulted. Treated for small bowel obstruction. Underwent EGD which showed esophagitis.  H&H relatively stable.  Anticoagulation on hold upon discharge.   Assessment and plan. Small bowel obstruction Presented with intractable nausea and vomiting. Treated conservatively. General Surgery consulted, NG tube placed; removed after resolution of SBO Abdominal x-ray from 10/26 showed contrast in colon Tolerating dysphagia 3 diet. General Surgery has signed off   Dark tarry stools/melena Esophagitis. Gastroenterology consulted; underwent EGD on 10/25 which showed esophagitis with no bleeding Recommend CT angiogram abdomen/pelvis if further episodes of bleeding Gastroenterology recommends to discontinue Eliquis  altogether Continue Protonix  40 mg p.o. 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 Anticoagulation was recommended. Was started on Eliquis .  Now discontinued as mostly acute phase is over for anticoagulation and patient had 2 separate life-threatening bleeding episodes.  SDH and June 2025 and GI bleed in October 2025. Gastroenterology recommends stopping Eliquis ; called and confirmed with patient's sister on phone.   Acute blood loss anemia  Hemoglobin dropped to 6.8, status post 1 unit PRBC H&H relatively stable.   Syncope Patient had syncope upon standing from bedside commode in ED He has history of orthostatic hypotension, also hypovolemic in setting of nausea vomiting and diarrhea and GI bleed Treated with IV hydration. Currently improving.   Acute kidney injury Serum creatinine 1.70 on admission.  Baseline 1.0. No hydronephrosis seen on CT abdomen/pelvis Likely prerenal in setting of nausea vomiting and GI bleed as above Treated with IV fluid.   Parkinson disease, mild cognitive impairment Sinemet  has been restarted   Hypokalemia Replete   Hypophosphatemia Replete   Pericardial effusion Small pericardial effusion noted on CT in the ED Echocardiogram showed EF 75%, grade 1 diastolic dysfunction; also showed small pericardial effusion Also had trivial pericardial effusion on echo obtained in June 2025 At present no further work up recommended as asymptomatic.   Pain control - [place]  Controlled Substance Reporting System database was reviewed. and patient was instructed, not to drive, operate heavy machinery, perform activities at heights, swimming or participation in water activities or provide baby-sitting services while on Pain, Sleep and Anxiety Medications; until their outpatient Physician has advised to do so again. Also recommended to not to take more than prescribed Pain, Sleep and Anxiety Medications.  Consultants:  Gastroenterology  General surgery    Procedures performed:  EGD  DISCHARGE MEDICATION: Allergies as of 07/15/2024       Reactions   Flomax  [tamsulosin  Hcl] Anxiety, Other (See Comments)   Jittery         Medication List     STOP taking these medications    apixaban  5 MG Tabs tablet Commonly known as: ELIQUIS    omeprazole  20 MG tablet Commonly known as: PRILOSEC  OTC       TAKE these medications    acetaminophen  500 MG tablet Commonly known as: TYLENOL  Take 1 tablet (500 mg total) by mouth every 6 (six) hours as needed for mild pain (pain score 1-3), moderate pain (pain score 4-6) or headache.   bisacodyl  10 MG suppository Commonly known as: DULCOLAX Place 1 suppository (10 mg total) rectally daily as needed for moderate constipation.   carbidopa -levodopa  50-200 MG tablet Commonly known as: SINEMET  CR Take 1 tablet by mouth 6 (six) times daily. (4-5 AM, 8 AM, 11 AM, 2 PM, 5 PM, 8 PM).   cyanocobalamin  1000 MCG tablet Take 1 tablet (1,000 mcg total) by mouth daily.   feeding supplement Liqd Take 237 mLs by mouth 2 (two) times daily between meals.   HYDROcodone -acetaminophen  5-325 MG tablet Commonly known as: NORCO/VICODIN Take 1 tablet by mouth every 8 (eight) hours as needed for severe pain (pain score 7-10) or moderate pain (pain score 4-6). What changed: reasons to take this   midodrine  5 MG tablet Commonly known as: PROAMATINE  Take 1 tablet (5 mg total) by mouth 2 (two) times daily with a meal.   multivitamin with minerals Tabs tablet Take 1 tablet by mouth daily.   pantoprazole  40 MG tablet Commonly known as: PROTONIX  Take 1 tablet (40 mg total) by mouth 2 (two) times daily.   polyethylene glycol 17 g packet Commonly known as: MIRALAX  / GLYCOLAX  Take 17 g by mouth daily. Start taking on: July 16, 2024   pyridOXINE  50 MG tablet Commonly known as: B-6 Take 1 tablet (50 mg total) by mouth daily.   thiamine  100 MG tablet Commonly known as: Vitamin B-1 Take 1 tablet (100 mg  total) by mouth daily.       Disposition: SNF Diet recommendation: Regular diet  Discharge Exam: Vitals:   07/14/24 1723 07/14/24 2047 07/15/24 0512 07/15/24 0900  BP: 97/66 120/75 123/80 116/69  Pulse: 100 95 98 97  Resp: 18 18 20  (!) 25  Temp: 98.1 F (36.7 C) 98.5 F (36.9 C) 97.7 F (36.5 C) 98.3 F (36.8 C)  TempSrc:   Oral   SpO2: 96% 98% 98% 98%  Weight:      Height:       General: in Mild distress, No Rash Cardiovascular: S1 and S2 Present, No Murmur Respiratory: Good respiratory effort, Bilateral Air entry present. No Crackles, No wheezes Abdomen: Bowel Sound present, No tenderness Extremities: No edema Neuro: Alert and oriented x3, no new focal deficit   Filed Weights   07/11/24 0528 07/12/24 0530 07/13/24 0738  Weight: 103.5 kg 103.1 kg 99.6 kg   Condition at discharge: stable  The results of significant diagnostics from this hospitalization (including imaging, microbiology, ancillary and laboratory) are listed below for reference.   Imaging Studies: DG Abd Portable 1V Result Date: 07/11/2024 EXAM: 1 VIEW XRAY OF THE ABDOMEN 07/11/2024 10:31:00 AM COMPARISON: 07/10/2024 CLINICAL HISTORY: SBO (small bowel obstruction) FINDINGS: LINES, TUBES AND DEVICES: Enteric tube with side port in distal esophagus. BOWEL: Improved bowel dilatation compared to previous  study. Similar appearance of retained enteric contrast material within the colon and rectum. SOFT TISSUES: No opaque urinary calculi. BONES: No acute osseous abnormality. IMPRESSION: 1. Enteric tube malposition with side port projecting over the distal esophagus; advancement recommended to position side port within the stomach. 2. Improved bowel dilatation compared to prior exam. Electronically signed by: Waddell Calk MD 07/11/2024 10:57 AM EDT RP Workstation: [id number]   DG Abd 1 View Result Date: 07/10/2024 EXAM: 1 VIEW XRAY OF THE ABDOMEN 07/10/2024 04:53:00 PM COMPARISON: Abdominal x-ray dated 07/10/2024.  CLINICAL HISTORY: [id number] Encounter for orogastric (OG) tube placement [id number]. NG tube placement Encounter for orogastric (OG) tube placement [id number]. NG tube placement FINDINGS: LINES, TUBES AND DEVICES: Orogastric tube tip is in the mid stomach with cuff at the level of the gastroesophageal junction. BOWEL: Nonobstructive bowel gas pattern. SOFT TISSUES: No opaque urinary calculi. BONES: No acute osseous abnormality. IMPRESSION: 1. Orogastric tube tip is in the mid stomach with the side hole at the level of the gastroesophageal junction. Recommended advancing tube 5 cm. Electronically signed by: Greig Pique MD 07/10/2024 05:22 PM EDT RP Workstation: [id number]   DG Abd 1 View Result Date: 07/10/2024 EXAM: 1 VIEW XRAY OF THE ABDOMEN 07/10/2024 10:04:00 AM COMPARISON: 07/09/2024 CLINICAL HISTORY: SBO (small bowel obstruction) FINDINGS: LINES, TUBES AND DEVICES: Enteric tube in place with tip in gastric antrum. BOWEL: Persistent dilated loops of mid to distal small bowel. Small bowel distension similar prior measuring up to 6.5 cm. Oral contrast has progressed and is noted in the hepatic flexure, splenic flexure, descending colon and rectum. SOFT TISSUES: No opaque urinary calculi. BONES: No acute osseous abnormality. IMPRESSION: 1. Persistent dilated loops of mid to distal small bowel measuring up to 6.5 cm, similar to prior study. 2. Oral contrast has progressed to the colon and rectum. Electronically signed by: Norleen Boxer MD 07/10/2024 01:58 PM EDT RP Workstation: [id number]   ECHOCARDIOGRAM COMPLETE Result Date: 07/09/2024    ECHOCARDIOGRAM REPORT   Patient Name:   YAPHET SMETHURST Date of Exam: 07/09/2024 Medical Rec #:  [id number]        Height:       74.0 in Accession #:    [id number]       Weight:       227.3 lb Date of Birth:  06-30-1961       BSA:          2.295 m Patient Age:    62 years         BP:           138/80 mmHg Patient Gender: M                HR:           118 bpm. Exam Location:   Inpatient Procedure: 2D Echo, Cardiac Doppler and Color Doppler (Both Spectral and Color            Flow Doppler were utilized during procedure). Indications:    Pericardia Effusion I31.3, Syncope R55  History:        Patient has prior history of Echocardiogram examinations, most                 recent 03/11/2024. Signs/Symptoms:Syncope; Risk                 Factors:Hypertension.  Sonographer:    BERNARDA ROCKS Referring Phys: [id number] TIMOTHY S OPYD IMPRESSIONS  1. Left ventricular ejection fraction, by estimation, is >75%. The left ventricle has hyperdynamic function.  The left ventricle has no regional wall motion abnormalities. Left ventricular diastolic parameters are consistent with Grade I diastolic dysfunction (impaired relaxation).  2. Right ventricular systolic function is normal. The right ventricular size is normal.  3. A small pericardial effusion is present. The pericardial effusion is circumferential. There is no evidence of cardiac tamponade.  4. The mitral valve is normal in structure. No evidence of mitral valve regurgitation. No evidence of mitral stenosis.  5. The aortic valve is tricuspid. There is mild calcification of the aortic valve. Aortic valve regurgitation is not visualized. Aortic valve sclerosis/calcification is present, without any evidence of aortic stenosis.  6. The inferior vena cava is normal in size with greater than 50% respiratory variability, suggesting right atrial pressure of 3 mmHg. Conclusion(s)/Recommendation(s): Technically limited study with poor acoustic images. FINDINGS  Left Ventricle: Left ventricular ejection fraction, by estimation, is >75%. The left ventricle has hyperdynamic function. The left ventricle has no regional wall motion abnormalities. The left ventricular internal cavity size was normal in size. There is no left ventricular hypertrophy. Left ventricular diastolic parameters are consistent with Grade I diastolic dysfunction (impaired relaxation). Right  Ventricle: The right ventricular size is normal. No increase in right ventricular wall thickness. Right ventricular systolic function is normal. Left Atrium: Left atrial size was normal in size. Right Atrium: Right atrial size was normal in size. Pericardium: A small pericardial effusion is present. The pericardial effusion is circumferential. There is no evidence of cardiac tamponade. Thickening/calcification of pericardium present. Mitral Valve: The mitral valve is normal in structure. No evidence of mitral valve regurgitation. No evidence of mitral valve stenosis. MV peak gradient, 4.2 mmHg. The mean mitral valve gradient is 2.0 mmHg. Tricuspid Valve: The tricuspid valve is normal in structure. Tricuspid valve regurgitation is trivial. No evidence of tricuspid stenosis. Aortic Valve: The aortic valve is tricuspid. There is mild calcification of the aortic valve. Aortic valve regurgitation is not visualized. Aortic valve sclerosis/calcification is present, without any evidence of aortic stenosis. Aortic valve mean gradient measures 3.0 mmHg. Aortic valve peak gradient measures 7.3 mmHg. Aortic valve area, by VTI measures 3.44 cm. Pulmonic Valve: The pulmonic valve was not well visualized. Pulmonic valve regurgitation is not visualized. No evidence of pulmonic stenosis. Aorta: The aortic root is normal in size and structure. Venous: The inferior vena cava is normal in size with greater than 50% respiratory variability, suggesting right atrial pressure of 3 mmHg. IAS/Shunts: No atrial level shunt detected by color flow Doppler.  LEFT VENTRICLE PLAX 2D LVIDd:         4.40 cm LVIDs:         2.80 cm LV PW:         1.00 cm LV IVS:        1.00 cm LVOT diam:     2.30 cm LV SV:         62 LV SV Index:   27 LVOT Area:     4.15 cm  LV Volumes (MOD) LV vol d, MOD A2C: 86.1 ml LV vol d, MOD A4C: 144.0 ml LV vol s, MOD A2C: 27.8 ml LV vol s, MOD A4C: 44.7 ml LV SV MOD A2C:     58.3 ml LV SV MOD A4C:     144.0 ml LV SV MOD BP:       75.6 ml RIGHT VENTRICLE             IVC RV Basal diam:  3.50 cm     IVC  diam: 1.20 cm RV S prime:     36.90 cm/s TAPSE (M-mode): 2.4 cm LEFT ATRIUM             Index        RIGHT ATRIUM           Index LA diam:        3.00 cm 1.31 cm/m   RA Area:     11.30 cm LA Vol (A2C):   38.5 ml 16.78 ml/m  RA Volume:   23.70 ml  10.33 ml/m LA Vol (A4C):   31.4 ml 13.68 ml/m LA Biplane Vol: 36.2 ml 15.77 ml/m  AORTIC VALVE                    PULMONIC VALVE AV Area (Vmax):    3.45 cm     PV Vmax:       0.97 m/s AV Area (Vmean):   3.30 cm     PV Peak grad:  3.7 mmHg AV Area (VTI):     3.44 cm AV Vmax:           135.00 cm/s AV Vmean:          80.900 cm/s AV VTI:            0.180 m AV Peak Grad:      7.3 mmHg AV Mean Grad:      3.0 mmHg LVOT Vmax:         112.00 cm/s LVOT Vmean:        64.200 cm/s LVOT VTI:          0.149 m LVOT/AV VTI ratio: 0.83  AORTA Ao Root diam: 3.30 cm Ao Asc diam:  3.30 cm MITRAL VALVE MV Area (PHT): 6.77 cm     SHUNTS MV Area VTI:   4.07 cm     Systemic VTI:  0.15 m MV Peak grad:  4.2 mmHg     Systemic Diam: 2.30 cm MV Mean grad:  2.0 mmHg MV Vmax:       1.02 m/s MV Vmean:      70.9 cm/s MV Decel Time: 112 msec MV E velocity: 65.90 cm/s MV A velocity: 107.00 cm/s MV E/A ratio:  0.62 Toribio Fuel MD Electronically signed by Toribio Fuel MD Signature Date/Time: 07/09/2024/8:28:38 PM    Final    DG Abd Portable 1V-Small Bowel Obstruction Protocol-initial, 8 hr delay Result Date: 07/09/2024 EXAM: 1 VIEW XRAY OF THE ABDOMEN 07/09/2024 07:10:00 PM COMPARISON: None available. CLINICAL HISTORY: Small bowel obstruction- 8 hr delay; Best images obtainable d/t pt condition. FINDINGS: LINES, TUBES AND DEVICES: Enter tube tip is at the level of the gastric antrum, unchanged. BOWEL: There are numerous dilated small bowel loops measuring up to 6.8 cm. No contrast seen within small bowel or colon. Minimal colonic gas present. SOFT TISSUES: No opaque urinary calculi. BONES: No acute osseous  abnormality. IMPRESSION: 1. Small bowel obstruction with numerous dilated loops measuring up to 6.8 cm. 2. No contrast seen within small bowel or colon. Minimal colonic gas present. Electronically signed by: Greig Pique MD 07/09/2024 08:24 PM EDT RP Workstation: [id number]   DG Abdomen 1 View Result Date: 07/09/2024 EXAM: 1 VIEW XRAY OF THE ABDOMEN 07/09/2024 05:08:37 AM COMPARISON: None available. CLINICAL HISTORY: Evaluate enteric tube placement. FINDINGS: LINES, TUBES AND DEVICES: Enteric tube in place with tip and side port projecting over the distal stomach. BOWEL: Partially imaged upper abdomen shows gaseous dilation of small bowel loops. SOFT TISSUES: No opaque  urinary calculi. BONES: No acute osseous abnormality. IMPRESSION: 1. Enteric tube is in place with tip and side port over the distal stomach. 2. Gaseous dilation of small bowel loops in the partially imaged upper abdomen. Electronically signed by: Waddell Calk MD 07/09/2024 06:31 AM EDT RP Workstation: [id number]   CT Angio Abd/Pel W and/or Wo Contrast Result Date: 07/08/2024 CLINICAL DATA:  Lower gastrointestinal bleeding, dark stool since this morning, nausea EXAM: CTA ABDOMEN AND PELVIS WITHOUT AND WITH CONTRAST TECHNIQUE: Multidetector CT imaging of the abdomen and pelvis was performed using the standard protocol during bolus administration of intravenous contrast. Multiplanar reconstructed images and MIPs were obtained and reviewed to evaluate the vascular anatomy. RADIATION DOSE REDUCTION: This exam was performed according to the departmental dose-optimization program which includes automated exposure control, adjustment of the mA and/or kV according to patient size and/or use of iterative reconstruction technique. CONTRAST:  80mL OMNIPAQUE  IOHEXOL  350 MG/ML SOLN COMPARISON:  03/10/2024 FINDINGS: VASCULAR Aorta: Normal caliber aorta without aneurysm, dissection, vasculitis or significant stenosis. Celiac: Patent without evidence of  aneurysm, dissection, vasculitis or significant stenosis. SMA: Patent without evidence of aneurysm, dissection, vasculitis or significant stenosis. Renals: Both renal arteries are patent without evidence of aneurysm, dissection, vasculitis, fibromuscular dysplasia or significant stenosis. IMA: Patent without evidence of aneurysm, dissection, vasculitis or significant stenosis. Inflow: Patent without evidence of aneurysm, dissection, vasculitis or significant stenosis. Proximal Outflow: Bilateral common femoral and visualized portions of the superficial and profunda femoral arteries are patent without evidence of aneurysm, dissection, vasculitis or significant stenosis. Veins: No acute venous abnormalities. Review of the MIP images confirms the above findings. NON-VASCULAR Lower chest: No acute pleural or parenchymal lung disease. Fluid-filled distal thoracic esophagus may reflect sequela of reflux. Small pericardial effusion. Hepatobiliary: Simple right lobe liver cyst is not require follow-up. Remainder of the liver is unremarkable. The gallbladder is moderately distended, with gallbladder wall thickening and pericholecystic fluid concerning for acute cholecystitis. No calcified gallstones. No biliary duct dilation. Pancreas: Unremarkable. No pancreatic ductal dilatation or surrounding inflammatory changes. Spleen: Normal in size without focal abnormality. Adrenals/Urinary Tract: Adrenal glands are unremarkable. Kidneys are normal, without renal calculi, focal lesion, or hydronephrosis. Bladder is unremarkable. Stomach/Bowel: There is marked distension of the stomach and small bowel, with jejunum measuring up to 5.5 cm in diameter. Transition from dilated to nondilated small bowel within the lower pelvis at the level of the distal jejunum/ileum. Findings are consistent with small-bowel obstruction. Scattered gas fluid levels within the: Compatible with diarrhea or recent laxative/enema use. There is no intraluminal  accumulation of contrast to suggest active gastrointestinal bleeding. Lymphatic: No pathologic adenopathy. Reproductive: Prostate is unremarkable. Other: No free fluid or free intraperitoneal gas. No abdominal wall hernia. Musculoskeletal: No acute or destructive bony abnormalities. Reconstructed images demonstrate no additional findings. IMPRESSION: VASCULAR 1. No evidence of active gastrointestinal bleeding. 2. No significant vascular abnormalities. NON-VASCULAR 1. Small-bowel obstruction, with transition from dilated to nondilated bowel within the distal jejunum/ileum within the lower pelvis. 2. Gallbladder wall thickening and pericholecystic fat stranding concerning for acute cholecystitis. No calcified gallstones. 3. Fluid-filled distal thoracic esophagus which may reflect sequela of reflux. 4. Scattered gas fluid levels within the colon, consistent with diarrhea or recent laxative/enema use. 5. Small pericardial effusion. Electronically Signed   By: Ozell Daring M.D.   On: 07/08/2024 18:02   CT Head Wo Contrast Result Date: 07/08/2024 EXAM: CT HEAD WITHOUT CONTRAST 07/08/2024 05:51:00 PM TECHNIQUE: CT of the head was performed without the administration of intravenous contrast. Automated  exposure control, iterative reconstruction, and/or weight based adjustment of the mA/kV was utilized to reduce the radiation dose to as low as reasonably achievable. COMPARISON: 07/06/2024 CLINICAL HISTORY: Fall on blood thinners. Possible GI bleed. FINDINGS: BRAIN AND VENTRICLES: No acute hemorrhage. No evidence of acute infarct. No hydrocephalus. No extra-axial collection. No mass effect or midline shift. Chronic microvascular ischemia and generalized atrophy. ORBITS: No acute abnormality. SINUSES: No acute abnormality. SOFT TISSUES AND SKULL: No acute soft tissue abnormality. No skull fracture. IMPRESSION: 1. No acute intracranial abnormality. Electronically signed by: Norman Gatlin MD 07/08/2024 05:57 PM EDT RP  Workstation: [id number]   CT Cervical Spine Wo Contrast Result Date: 07/06/2024 EXAM: CT CERVICAL SPINE WITHOUT CONTRAST 07/06/2024 08:07:19 AM TECHNIQUE: CT of the cervical spine was performed without the administration of intravenous contrast. Multiplanar reformatted images are provided for review. Automated exposure control, iterative reconstruction, and/or weight based adjustment of the mA/kV was utilized to reduce the radiation dose to as low as reasonably achievable. COMPARISON: Cervical spine CT 06/10/2024. CLINICAL HISTORY: 63 year old male. Polytrauma, blunt. Fall head and neck injury. FINDINGS: CERVICAL SPINE: BONES AND ALIGNMENT: No acute fracture or traumatic malalignment. Stable cervical lordosis. 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 SOFT TISSUES: Negative visible noncontrast thoracic inlet. No prevertebral soft tissue swelling. IMPRESSION: 1. No acute traumatic injury identified in the cervical spine. 2. 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 And moderate to severe chronic spinal stenosis at C3-C4. Electronically signed by: Helayne Hurst MD 07/06/2024 08:27 AM EDT RP Workstation: [id number]   CT Head Wo Contrast Result Date: 07/06/2024 EXAM: CT HEAD WITHOUT CONTRAST 07/06/2024 08:07:19 AM TECHNIQUE: CT of the head was performed without the administration of intravenous contrast. Automated exposure control, iterative reconstruction, and/or weight based adjustment of the mA/kV was utilized to reduce the radiation dose to as low as reasonably achievable. COMPARISON: Brain MRI 03/29/2024 and Head CT 06/10/2024. CLINICAL HISTORY: 63 year old male  with polytrauma, blunt. Fall head and neck injury. FINDINGS: BRAIN AND VENTRICLES: No acute hemorrhage. No evidence of acute infarct. No hydrocephalus. No mass effect or midline shift. Since July, no significant subdural hematoma by CT. Stable cerebral volume. Stable gray-white differentiation. Mild per age nonspecific chronic white matter changes. No suspicious intracranial vascular hyperdensity. ORBITS: No new orbital injury identified. SINUSES: No acute abnormality. SOFT TISSUES AND SKULL: Decreased forehead soft tissue swelling since last month. No new scalp soft tissue injury identified. No skull fracture. IMPRESSION: 1. No acute intracranial abnormality or acute traumatic injury identified. 2. Forehead soft tissue swelling decreased from prior. Electronically signed by: Helayne Hurst MD 07/06/2024 08:23 AM EDT RP Workstation: [id number]    Microbiology: Results for orders placed or performed during the hospital encounter of 03/24/24  Blood culture (routine x 2)     Status: None   Collection Time: 03/24/24 11:44 AM   Specimen: BLOOD LEFT ARM  Result Value Ref Range Status   Specimen Description BLOOD LEFT ARM  Final   Special Requests   Final    BOTTLES DRAWN AEROBIC AND ANAEROBIC Blood Culture results may not be optimal due to an inadequate volume of blood received in culture bottles   Culture   Final    NO GROWTH 5 DAYS Performed at [facility],  [address], [place], [place] [address]    Report Status 03/29/2024 FINAL  Final  Blood culture (routine x 2)     Status: None   Collection Time: 03/24/24 11:50 AM   Specimen: BLOOD RIGHT ARM  Result Value Ref Range Status   Specimen Description BLOOD RIGHT ARM  Final   Special Requests   Final    BOTTLES DRAWN AEROBIC AND ANAEROBIC Blood Culture results may not be optimal due to an inadequate volume of blood received in culture bottles   Culture   Final    NO GROWTH 5 DAYS Performed at [facility], [address]  [place], [place] [address]    Report Status 03/29/2024 FINAL  Final   Labs: CBC: Recent Labs  Lab 07/08/24 1608 07/08/24 1719 07/10/24 0855 07/10/24 1454 07/11/24 0315 07/12/24 0414 07/13/24 0455  WBC 12.4*   < > 6.8 6.4 5.6 9.8 9.7  NEUTROABS 8.1*  --   --   --   --   --   --   HGB 9.5*   < > 6.8* 7.4* 7.3* 7.9* 8.6*  HCT 31.3*   < > 22.1* 23.3* 23.3* 25.2* 27.8*  MCV 83.7   < > 83.1 82.3 83.5 84.0 83.2  PLT 363   < > 311 275 275 336 415*   < > = values in this interval not displayed.   Basic Metabolic Panel: Recent Labs  Lab 07/08/24 1608 07/08/24 1719 07/10/24 0552 07/11/24 0315 07/12/24 0414 07/13/24 0455 07/15/24 0311  NA 137   < > 141 144 143 143 139  K 3.0*   < > 3.0* 4.1 3.4* 3.5 3.7  CL 102   < > 110 114* 113* 113* 106  CO2 16*   < > 20* 19* 21* 23 22  GLUCOSE 197*   < > 105* 118* 115* 126* 101*  BUN 53*   < > 43* 28* 24* 22 16  CREATININE 1.70*   < > 1.17 0.97 1.05 1.08 1.01  CALCIUM  8.7*   < > 8.4* 8.4* 8.7* 8.6* 8.4*  MG 2.0  --  1.9  --   --   --  1.7  PHOS  --   --  1.3* 3.8  --   --   --    < > = values in this interval not displayed.   Liver Function Tests: Recent Labs  Lab 07/08/24 1608 07/13/24 0455  AST 38 21  ALT 6 7  ALKPHOS 74 66  BILITOT 1.0 0.3  PROT 6.9 5.7*  ALBUMIN 3.2* 2.8*   CBG: Recent Labs  Lab 07/08/24 1702  GLUCAP 223*    Discharge time spent: greater than 30 minutes.  Author: Yetta Blanch, MD  [facility]

## 2024-07-15 NOTE — Plan of Care (Signed)
   Problem: Education: Goal: Knowledge of General Education information will improve Description: Including pain rating scale, medication(s)/side effects and non-pharmacologic comfort measures Outcome: Completed/Met

## 2024-07-16 ENCOUNTER — Emergency Department (HOSPITAL_COMMUNITY)
Admission: EM | Admit: 2024-07-16 | Discharge: 2024-07-16 | Disposition: A | Discharge status: Home / Self Care | Source: Skilled Nursing Facility | Attending: Emergency Medicine | Admitting: Emergency Medicine

## 2024-07-16 ENCOUNTER — Encounter (HOSPITAL_COMMUNITY): Payer: Self-pay | Admitting: Emergency Medicine

## 2024-07-16 ENCOUNTER — Other Ambulatory Visit: Payer: Self-pay

## 2024-07-16 ENCOUNTER — Emergency Department (HOSPITAL_COMMUNITY)

## 2024-07-16 DIAGNOSIS — Z79899 Other long term (current) drug therapy: Secondary | ICD-10-CM | POA: Insufficient documentation

## 2024-07-16 DIAGNOSIS — W19XXXA Unspecified fall, initial encounter: Secondary | ICD-10-CM | POA: Diagnosis not present

## 2024-07-16 DIAGNOSIS — R2681 Unsteadiness on feet: Secondary | ICD-10-CM | POA: Diagnosis present

## 2024-07-16 LAB — COMPREHENSIVE METABOLIC PANEL WITH GFR
ALT: 6 U/L (ref 0–44)
AST: 13 U/L — ABNORMAL LOW (ref 15–41)
Albumin: 2.7 g/dL — ABNORMAL LOW (ref 3.5–5.0)
Alkaline Phosphatase: 69 U/L (ref 38–126)
Anion gap: 10 (ref 5–15)
BUN: 13 mg/dL (ref 8–23)
CO2: 22 mmol/L (ref 22–32)
Calcium: 8.5 mg/dL — ABNORMAL LOW (ref 8.9–10.3)
Chloride: 107 mmol/L (ref 98–111)
Creatinine, Ser: 1.13 mg/dL (ref 0.61–1.24)
GFR, Estimated: >60 mL/min (ref >60)
Glucose, Bld: 98 mg/dL (ref 70–99)
Potassium: 3.8 mmol/L (ref 3.5–5.1)
Sodium: 139 mmol/L (ref 135–145)
Total Bilirubin: 0.5 mg/dL (ref 0.0–1.2)
Total Protein: 5.5 g/dL — ABNORMAL LOW (ref 6.5–8.1)

## 2024-07-16 LAB — CBC WITH DIFFERENTIAL/PLATELET
Abs Immature Granulocytes: 0.12 K/uL — ABNORMAL HIGH (ref 0.00–0.07)
Basophils Absolute: 0.1 K/uL (ref 0.0–0.1)
Basophils Relative: 1 %
Eosinophils Absolute: 0.2 K/uL (ref 0.0–0.5)
Eosinophils Relative: 3 %
HCT: 27 % — ABNORMAL LOW (ref 39.0–52.0)
Hemoglobin: 8.1 g/dL — ABNORMAL LOW (ref 13.0–17.0)
Immature Granulocytes: 1 %
Lymphocytes Relative: 19 %
Lymphs Abs: 1.6 K/uL (ref 0.7–4.0)
MCH: 25.7 pg — ABNORMAL LOW (ref 26.0–34.0)
MCHC: 30 g/dL (ref 30.0–36.0)
MCV: 85.7 fL (ref 80.0–100.0)
Monocytes Absolute: 0.5 K/uL (ref 0.1–1.0)
Monocytes Relative: 6 %
Neutro Abs: 5.9 K/uL (ref 1.7–7.7)
Neutrophils Relative %: 70 %
Platelets: 396 K/uL (ref 150–400)
RBC: 3.15 MIL/uL — ABNORMAL LOW (ref 4.22–5.81)
RDW: 16 % — ABNORMAL HIGH (ref 11.5–15.5)
WBC: 8.3 K/uL (ref 4.0–10.5)
nRBC: 0 % (ref 0.0–0.2)

## 2024-07-16 NOTE — ED Notes (Signed)
 ETA  A ONE HOUR CALLED  PTAR

## 2024-07-16 NOTE — ED Notes (Signed)
 Patient transported to CT

## 2024-07-16 NOTE — ED Provider Notes (Signed)
 [place] EMERGENCY DEPARTMENT AT [facility] Provider Note   CSN: [id number] Arrival date & time: 07/16/24  9257     Patient presents with: Mechanical fall   John [name] is a 63 y.o. male.   HPI Patient presents via EMS from his nursing facility after a fall.  He notes that he lost his balance, was not dizzy, was not weak, and fell to the ground.  He denies substantial head trauma, loss of consciousness, or any focal weakness after the event.  He describes frustration at having been in similar straits several times over the month, including being evaluated here last week.    Prior to Admission medications   Medication Sig Start Date End Date Taking? Authorizing Provider  acetaminophen  (TYLENOL ) 500 MG tablet Take 1 tablet (500 mg total) by mouth every 6 (six) hours as needed for mild pain (pain score 1-3), moderate pain (pain score 4-6) or headache. 03/16/24   Hongalgi, Anand D, MD  bisacodyl  (DULCOLAX) 10 MG suppository Place 1 suppository (10 mg total) rectally daily as needed for moderate constipation. 07/15/24   Tobie Yetta HERO, MD  carbidopa -levodopa  (SINEMET  CR) 50-200 MG tablet Take 1 tablet by mouth 6 (six) times daily. (4-5 AM, 8 AM, 11 AM, 2 PM, 5 PM, 8 PM). 12/08/23   Jaffe, Adam R, DO  cyanocobalamin  1000 MCG tablet Take 1 tablet (1,000 mcg total) by mouth daily. 03/23/24   Rashid, Farhan, MD  feeding supplement (ENSURE PLUS HIGH PROTEIN) LIQD Take 237 mLs by mouth 2 (two) times daily between meals. 07/15/24   Tobie Yetta HERO, MD  HYDROcodone -acetaminophen  (NORCO/VICODIN) 5-325 MG tablet Take 1 tablet by mouth every 8 (eight) hours as needed for severe pain (pain score 7-10) or moderate pain (pain score 4-6). 07/15/24   Tobie Yetta HERO, MD  midodrine  (PROAMATINE ) 5 MG tablet Take 1 tablet (5 mg total) by mouth 2 (two) times daily with a meal. 04/16/24   Singh, Prashant K, MD  Multiple Vitamin (MULTIVITAMIN WITH MINERALS) TABS tablet Take 1 tablet by mouth daily.  03/23/24   Rashid, Farhan, MD  pantoprazole  (PROTONIX ) 40 MG tablet Take 1 tablet (40 mg total) by mouth 2 (two) times daily. 07/15/24   Patel, Pranav M, MD  polyethylene glycol (MIRALAX  / GLYCOLAX ) 17 g packet Take 17 g by mouth daily. 07/16/24   Tobie Yetta HERO, MD  pyridOXINE  (B-6) 50 MG tablet Take 1 tablet (50 mg total) by mouth daily. 04/17/24   Singh, Prashant K, MD  thiamine  (VITAMIN B-1) 100 MG tablet Take 1 tablet (100 mg total) by mouth daily. 04/17/24   Dennise Lavada MARLA, MD    Allergies: Flomax  [tamsulosin  hcl]    Review of Systems  Updated Vital Signs BP 111/72   Pulse 87   Temp (!) 97.3 F (36.3 C) (Oral)   Resp 15   SpO2 99%   Physical Exam Vitals and nursing note reviewed.  Constitutional:      General: He is not in acute distress.    Appearance: He is well-developed.  HENT:     Head: Normocephalic and atraumatic.  Eyes:     Conjunctiva/sclera: Conjunctivae normal.  Cardiovascular:     Rate and Rhythm: Normal rate and regular rhythm.  Pulmonary:     Effort: Pulmonary effort is normal. No respiratory distress.     Breath sounds: No stridor.  Abdominal:     General: There is no distension.  Skin:    General: Skin is warm and dry.  Neurological:     Mental Status: He is alert and oriented to person, place, and time.     (all labs ordered are listed, but only abnormal results are displayed) Labs Reviewed  COMPREHENSIVE METABOLIC PANEL WITH GFR - Abnormal; Notable for the following components:      Result Value   Calcium  8.5 (*)    Total Protein 5.5 (*)    Albumin 2.7 (*)    AST 13 (*)    All other components within normal limits  CBC WITH DIFFERENTIAL/PLATELET - Abnormal; Notable for the following components:   RBC 3.15 (*)    Hemoglobin 8.1 (*)    HCT 27.0 (*)    MCH 25.7 (*)    RDW 16.0 (*)    Abs Immature Granulocytes 0.12 (*)    All other components within normal limits    EKG: EKG Interpretation Date/Time:  Friday July 16 2024 07:50:37  EDT Ventricular Rate:  95 PR Interval:  173 QRS Duration:  98 QT Interval:  356 QTC Calculation: 448 R Axis:   20  Text Interpretation: Sinus rhythm Low voltage, precordial leads Artifact Abnormal ECG Confirmed by Garrick Charleston [phone]) on 07/16/2024 7:53:33 AM  Radiology: CT Head Wo Contrast Result Date: 07/16/2024 EXAM: CT HEAD WITHOUT CONTRAST 07/16/2024 08:00:56 AM TECHNIQUE: CT of the head was performed without the administration of intravenous contrast. Automated exposure control, iterative reconstruction, and/or weight based adjustment of the mA/kV was utilized to reduce the radiation dose to as low as reasonably achievable. COMPARISON: CT head 07/08/2024 CLINICAL HISTORY: Polytrauma, blunt FINDINGS: BRAIN AND VENTRICLES: No acute hemorrhage. No evidence of acute infarct. No hydrocephalus. No extra-axial collection. No mass effect or midline shift. ORBITS: No acute abnormality. SINUSES: No acute abnormality. SOFT TISSUES AND SKULL: No skull fracture. Right forehead/periorbital contusion. IMPRESSION: 1. No acute intracranial abnormality. 2. Right forehead/periorbital contusion. Electronically signed by: Gilmore Molt MD 07/16/2024 08:10 AM EDT RP Workstation: [id number]     Procedures   Medications Ordered in the ED - No data to display                                  Medical Decision Making Elderly male with frequent falls presents after fall, following description of losing his balance.  Patient is awake, alert, insightful, hemodynamically unremarkable. Cardiac 95 sinus normal pulse ox 100% room air normal concern for intracranial abnormality, fracture given the fall, head trauma, though his neuroexam is reassuring. History of similar prior events with reassuring evaluations is also reassuring. Patient had CT, labs  Amount and/or Complexity of Data Reviewed Independent Historian: EMS Labs: ordered. Decision-making details documented in ED Course. Radiology: ordered and  independent interpretation performed. ECG/medicine tests: ordered and independent interpretation performed. Decision-making details documented in ED Course.  Risk Decision regarding hospitalization. Diagnosis or treatment significantly limited by social determinants of health.   9:33 AM Patient in no distress, resting, hemodynamically unremarkable, labs consistent with multiple prior studies, CT without evidence for intracranial abnormality.  Given his reassuring CT, labs, absence of distress, absence decompensation on hours of monitoring, patient will return to his nursing facility.  Final diagnoses:  Fall, initial encounter     Garrick Charleston, MD 07/16/24 [phone]

## 2024-07-16 NOTE — ED Triage Notes (Signed)
 Pt BIB [facility] from [name] for losing balance, knocked some stuff down and sat in floor per pt. Facility states he fell. Supposed to use walker but wasn't.  NO thinner, No LOC, no head injury.  Ambulated with assistance for EMS.   128/80 HR96 97% RR18, CBG 111 T: 98.3

## 2024-07-16 NOTE — ED Notes (Signed)
 PTAR ETA 1 hour

## 2024-08-16 NOTE — Progress Notes (Deleted)
 Chief Complaint: Hospital follow-up for esophagitis  HPI:    [name] is a 63 year old male with a past medical history as listed below including hypertension, bilateral PE on Eliquis , Parkinson's disease, frequent falls, subdural hematoma 03/2024, seizures, anemia and GERD, known to Dr. Legrand after recent hospitalization, who dents to clinic today for follow-up after hospitalization for esophagitis.      03/10/2024 echo with LVEF 60-65%.  Grade 1 diastolic dysfunction.    07/05/2024-07/12/2024 patient followed by our service at the hospital for hematemesis, dark stools and SBO.  At time of admission described nausea vomiting, coffee-ground emesis and tarry stools.  Hemoglobin 9.5.  OBT positive.  Abdominal pelvic CTA negative for active bleed.  Small bowel obstruction with transition from dilated to nondilated bowel within the distal jejunum/ileum.  Gallbladder wall thickening with Abran cholecystic fat stranding concerning for acute cholecystitis without gallstones.  Fluid-filled thoracic esophagus.  Discussed history of GERD.  Underwent a Cologuard test 7/24 which is negative.  At that time recommended EGD after resolution of SBO.    07/10/2024 EGD with LA grade D reflux esophagitis, small hiatal hernia.  That time n.p.o. because of bowel obstruction still present on exam that day.  Continued on NG tube.  Started on Pantoprazole  40 IV twice daily.    07/16/2024 CBC with a hemoglobin of 8.1.  CMP with an albumin low at 2.7.  Past Medical History:  Diagnosis Date   Acute metabolic encephalopathy 03/24/2024   Acute pulmonary embolism 03/10/2024   AKI (acute kidney injury) 03/16/2024   ARF (acute renal failure) 03/10/2024   Bilateral pulmonary embolism 03/16/2024   BPH with urinary obstruction 03/24/2024   Cervical myelopathy    Essential hypertension 10/29/2021   Family history of prostate cancer 10/29/2021   Frequent falls 03/24/2024   Gastroesophageal reflux disease without esophagitis  10/29/2021   Hypokalemia 03/10/2024   Hypomagnesemia 03/24/2024   Hyponatremia 03/10/2024   Impairment of balance 03/12/2022   Laceration of head 03/24/2024   Lactic acidosis 03/16/2024   Lateral epicondylitis of right elbow 03/10/2024   Lymphedema 03/12/2022   Maxillary fracture 03/10/2024   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 Consider a repeat head CT to assess for any acute hemorrhage within the hematomas. Balanced mass effect upon the underlying cerebral hemispheres without midline shift   Tinnitus of both ears 12/10/2023   Urinary tract infection 03/24/2024    Past Surgical History:  Procedure Laterality Date   ESOPHAGOGASTRODUODENOSCOPY N/A 07/10/2024   Procedure: EGD (ESOPHAGOGASTRODUODENOSCOPY);  Surgeon: Legrand Victory LITTIE DOUGLAS, MD;  Location: [facility] ENDOSCOPY;  Service: Gastroenterology;  Laterality: N/A;   RADIOLOGY WITH ANESTHESIA N/A 03/29/2024   Procedure: RADIOLOGY WITH ANESTHESIA;  Surgeon: Radiologist, Medication, MD;  Location: [facility] OR;  Service: Radiology;  Laterality: N/A;    Current Outpatient Medications  Medication Sig Dispense Refill   acetaminophen  (TYLENOL ) 500 MG tablet Take 1 tablet (500 mg total) by mouth every 6 (six) hours as needed for mild pain (pain score  1-3), moderate pain (pain score 4-6) or headache.     bisacodyl  (DULCOLAX) 10 MG suppository Place 1 suppository (10 mg total) rectally daily as needed for moderate constipation. 12  suppository 0   carbidopa -levodopa  (SINEMET  CR) 50-200 MG tablet Take 1 tablet by mouth 6 (six) times daily. (4-5 AM, 8 AM, 11 AM, 2 PM, 5 PM, 8 PM). 180 tablet 5   cyanocobalamin  1000 MCG tablet Take 1 tablet (1,000 mcg total) by mouth daily. 30 tablet 0   feeding supplement (ENSURE PLUS HIGH PROTEIN) LIQD Take 237 mLs by mouth 2 (two) times daily between meals.     HYDROcodone -acetaminophen  (NORCO/VICODIN) 5-325 MG tablet Take 1 tablet by mouth every 8 (eight) hours as needed for severe pain (pain score 7-10) or moderate pain (pain score 4-6). 5 tablet 0   midodrine  (PROAMATINE ) 5 MG tablet Take 1 tablet (5 mg total) by mouth 2 (two) times daily with a meal.     Multiple Vitamin (MULTIVITAMIN WITH MINERALS) TABS tablet Take 1 tablet by mouth daily. 30 tablet 0   pantoprazole  (PROTONIX ) 40 MG tablet Take 1 tablet (40 mg total) by mouth 2 (two) times daily. 60 tablet 0   polyethylene glycol (MIRALAX  / GLYCOLAX ) 17 g packet Take 17 g by mouth daily. 14 each 0   pyridOXINE  (B-6) 50 MG tablet Take 1 tablet (50 mg total) by mouth daily.     thiamine  (VITAMIN B-1) 100 MG tablet Take 1 tablet (100 mg total) by mouth daily.     No current facility-administered medications for this visit.    Allergies as of 08/17/2024 - Review Complete 07/16/2024  Allergen Reaction Noted   Flomax  [tamsulosin  hcl] Anxiety and Other (See Comments) 03/16/2024    Family History  Problem Relation Age of Onset   Cancer Father     Social History   Socioeconomic History   Marital status: Divorced    Spouse name: Not on file   Number of children: Not on file   Years of education: 16   Highest education level: Bachelor's degree (e.g., BA, AB, BS)  Occupational History   Occupation: Unemployed    Comment: Retail - [employer]  Tobacco Use   Smoking status: Former    Types: Cigars    Quit date: 2000    Years since quitting: 25.9   Smokeless tobacco: Never   Tobacco comments:    Quit 2000  Vaping Use   Vaping  status: Never Used  Substance and Sexual Activity   Alcohol use: Not Currently   Drug use: Never   Sexual activity: Not Currently    Partners: Female    Birth control/protection: None  Other Topics Concern   Not on file  Social History Narrative   ** Merged History Encounter **       Right handed   Social Drivers of Health   Financial Resource Strain: Not on file  Food Insecurity: No Food Insecurity (07/11/2024)   Hunger Vital Sign    Worried About Running Out of Food in the Last Year: Never true    Ran Out of Food in the Last Year: Never true  Transportation Needs: No Transportation Needs (07/11/2024)   PRAPARE - Administrator, Civil Service (Medical): No    Lack of Transportation (Non-Medical): No  Physical Activity: Not on file  Stress: Not on file  Social Connections: Not on file  Intimate Partner Violence: Not At Risk (07/11/2024)   Humiliation, Afraid, Rape, and Kick questionnaire  Fear of Current or Ex-Partner: No    Emotionally Abused: No    Physically Abused: No    Sexually Abused: No    Review of Systems:    Constitutional: No weight loss, fever, chills, weakness or fatigue HEENT: Eyes: No change in vision               Ears, Nose, Throat:  No change in hearing or congestion Skin: No rash or itching Cardiovascular: No chest pain, chest pressure or palpitations   Respiratory: No SOB or cough Gastrointestinal: See HPI and otherwise negative Genitourinary: No dysuria or change in urinary frequency Neurological: No headache, dizziness or syncope Musculoskeletal: No new muscle or joint pain Hematologic: No bleeding or bruising Psychiatric: No history of depression or anxiety    Physical Exam:  Vital signs: There were no vitals taken for this visit.  Constitutional:   Pleasant Caucasian male appears to be in NAD, Well developed, Well nourished, alert and cooperative Head:  Normocephalic and atraumatic. Eyes:   PEERL, EOMI. No icterus.  Conjunctiva pink. Ears:  Normal auditory acuity. Neck:  Supple Throat: Oral cavity and pharynx without inflammation, swelling or lesion.  Respiratory: Respirations even and unlabored. Lungs clear to auscultation bilaterally.   No wheezes, crackles, or rhonchi.  Cardiovascular: Normal S1, S2. No MRG. Regular rate and rhythm. No peripheral edema, cyanosis or pallor.  Gastrointestinal:  Soft, nondistended, nontender. No rebound or guarding. Normal bowel sounds. No appreciable masses or hepatomegaly. Rectal:  Not performed.  Msk:  Symmetrical without gross deformities. Without edema, no deformity or joint abnormality.  Neurologic:  Alert and  oriented x4;  grossly normal neurologically.  Skin:   Dry and intact without significant lesions or rashes. Psychiatric: Oriented to person, place and time. Demonstrates good judgement and reason without abnormal affect or behaviors.  RELEVANT LABS AND IMAGING: CBC    Component Value Date/Time   WBC 8.3 07/16/2024 0748   RBC 3.15 (L) 07/16/2024 0748   HGB 8.1 (L) 07/16/2024 0748   HGB 11.8 (L) 12/10/2023 1222   HCT 27.0 (L) 07/16/2024 0748   HCT 36.4 (L) 12/10/2023 1222   PLT 396 07/16/2024 0748   PLT 303 12/10/2023 1222   MCV 85.7 07/16/2024 0748   MCV 85 12/10/2023 1222   MCH 25.7 (L) 07/16/2024 0748   MCHC 30.0 07/16/2024 0748   RDW 16.0 (H) 07/16/2024 0748   RDW 13.5 12/10/2023 1222   LYMPHSABS 1.6 07/16/2024 0748   LYMPHSABS 1.7 12/10/2023 1222   MONOABS 0.5 07/16/2024 0748   EOSABS 0.2 07/16/2024 0748   EOSABS 0.1 12/10/2023 1222   BASOSABS 0.1 07/16/2024 0748   BASOSABS 0.1 12/10/2023 1222    CMP     Component Value Date/Time   NA 139 07/16/2024 0748   NA 137 12/10/2023 1222   K 3.8 07/16/2024 0748   CL 107 07/16/2024 0748   CO2 22 07/16/2024 0748   GLUCOSE 98 07/16/2024 0748   BUN 13 07/16/2024 0748   BUN 20 12/10/2023 1222   CREATININE 1.13 07/16/2024 0748   CALCIUM  8.5 (L) 07/16/2024 0748   PROT 5.5 (L) 07/16/2024 0748    PROT 6.4 12/10/2023 1222   ALBUMIN 2.7 (L) 07/16/2024 0748   ALBUMIN 4.2 12/10/2023 1222   AST 13 (L) 07/16/2024 0748   ALT 6 07/16/2024 0748   ALKPHOS 69 07/16/2024 0748   BILITOT 0.5 07/16/2024 0748   BILITOT 0.7 12/10/2023 1222   GFRNONAA >60 07/16/2024 0748   GFRAA 88 03/13/2020 1531  Assessment: 1.  Grade D esophagitis: Found every time of recent EGD 07/10/2024 after admission for GI bleed  Plan: 1. ***     [name] Failing, PA-C [facility] 08/16/2024, 2:17 PM  Cc: [name], MD

## 2024-08-17 ENCOUNTER — Ambulatory Visit: Admitting: Physician Assistant

## 2024-09-02 ENCOUNTER — Telehealth: Payer: Self-pay | Admitting: Physician Assistant

## 2024-09-02 NOTE — Telephone Encounter (Signed)
 Pt calling to inquire about Doctor that provided him testing and any drivers assistance programs also questions on being referred to other providers , please call back to discuss

## 2024-09-14 ENCOUNTER — Ambulatory Visit: Admitting: Family Medicine

## 2024-09-14 ENCOUNTER — Encounter: Payer: Self-pay | Admitting: Family Medicine

## 2024-09-22 NOTE — Progress Notes (Signed)
 "  Chief Complaint: Hospital follow-up for esophagitis  HPI:    John [name] is a 64 year old male, assigned to Dr. Legrand at last hospitalization, with a past medical history as listed below including PE on Eliquis , Parkinson's disease, frequent falls, subdural hematoma 03/2024, seizures, anemia, GERD and multiple others, who presents to clinic today for follow-up after hospitalization for esophagitis.    03/2023 Cologuard negative.    07/05/2024-07/12/2024 patient followed by our service at the hospital for hematemesis, dark stools and SBO.  At that time to come to the hospital after witnessed fall at his nursing home on 10/21.  Hemoglobin 10.8 with a baseline around 9.3 2 months prior.  Patient then John presented to the ED 07/08/2024 from his nursing facility with nausea vomiting coffee-ground emesis and tarry loose stools abdominal pain.  Hemoglobin 9.5, platelets 363, INR 1.8.  FOBT positive.  CTA negative for active bleed but identify small bowel obstruction.  Also gallbladder wall thickening with Abran cholecystic fat stranding concerning for acute cholecystitis.  Thoracic esophagus fluid-filled suggestive of reflux.  At that time recommended EGD when SBO resolved.    07/10/2024 EGD with LA grade D reflux esophagitis and a small hiatal hernia.  Otherwise normal duodenum.  Patient kept on NGT given ongoing SBO.  Pantoprazole  40 twice daily.    07/12/24 our service signed off.  No further evidence of GI bleeding from esophagitis.  Told to follow-up outpatient to assess possible repeat EGD.  Discussed the use of AI scribe software for clinical note transcription with the patient, who gave verbal consent to proceed.  History of Present Illness  Hospitalized in October for severe esophagitis with bleeding; prior endoscopy in the hospital documented grade D esophagitis with erosions and ulcers. Experienced melena and a drop in blood count during hospitalization. Pantoprazole  40 mg twice daily has been  continued since discharge. Blood thinner was discontinued.  Over the past several weeks, progressive sensation of choking occurs nightly with solid foods, including hamburger and beef. Describes food getting hung up in his throat and occasional inability to breathe until drinking water, which reliably relieves symptoms. No difficulty with liquids. Episodes of regurgitation of well-chewed, mushy food after coughing have occurred. Eats all desired foods, chews thoroughly.  No other gastrointestinal symptoms. Denies abdominal pain and reports normal bowel movements. No history of trouble with anesthesia, acute heart pain, or need for supplemental oxygen.  Denies fever, chills, abdominal pain or symptoms that awaken him from sleep.    Previous Imaging: ECHO 03/10/2024: IMPRESSIONS Left ventricular ejection fraction, by estimation, is 60 to 65%. The left ventricle has normal function. The left ventricle has no regional wall motion abnormalities. Left ventricular diastolic parameters are consistent with Grade I diastolic dysfunction (impaired relaxation). 1. 2. Right ventricular systolic function is normal. The right ventricular size is normal. 3. Left atrial size was mildly dilated. 4. Right atrial size was mildly dilated. 5. There is no evidence of cardiac tamponade. 6. The mitral valve is grossly normal. Trivial mitral valve regurgitation. 7. The aortic valve is tricuspid. Aortic valve regurgitation is not visualized. The inferior vena cava is normal in size with greater than 50% respiratory variability, suggesting right atrial pressure of 3 mmHg.   Past Medical History:  Diagnosis Date   Acute metabolic encephalopathy 03/24/2024   Acute pulmonary embolism 03/10/2024   AKI (acute kidney injury) 03/16/2024   ARF (acute renal failure) 03/10/2024   Bilateral pulmonary embolism 03/16/2024   BPH with urinary obstruction 03/24/2024   Cervical myelopathy  Essential hypertension 10/29/2021   Family  history of prostate cancer 10/29/2021   Frequent falls 03/24/2024   Gastroesophageal reflux disease without esophagitis 10/29/2021   Hypokalemia 03/10/2024   Hypomagnesemia 03/24/2024   Hyponatremia 03/10/2024   Impairment of balance 03/12/2022   Laceration of head 03/24/2024   Lactic acidosis 03/16/2024   Lateral epicondylitis of right elbow 03/10/2024   Lymphedema 03/12/2022   Maxillary fracture 03/10/2024   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 Consider a repeat head CT to assess for any acute hemorrhage within the hematomas. Balanced mass effect upon the underlying cerebral hemispheres without midline shift   Tinnitus of both ears 12/10/2023   Urinary tract infection 03/24/2024    Past Surgical History:  Procedure Laterality Date   ESOPHAGOGASTRODUODENOSCOPY N/A 07/10/2024   Procedure: EGD (ESOPHAGOGASTRODUODENOSCOPY);  Surgeon: Legrand Victory LITTIE DOUGLAS, MD;  Location: [facility] ENDOSCOPY;  Service: Gastroenterology;  Laterality: N/A;   RADIOLOGY WITH ANESTHESIA N/A 03/29/2024   Procedure: RADIOLOGY WITH ANESTHESIA;  Surgeon: Radiologist, Medication, MD;  Location: [facility] OR;  Service: Radiology;  Laterality: N/A;    Current Outpatient Medications  Medication Sig Dispense Refill   acetaminophen  (TYLENOL ) 500 MG tablet Take 1 tablet (500 mg total) by mouth every 6 (six) hours as needed for mild pain (pain score 1-3), moderate pain (pain score 4-6) or headache.     bisacodyl   (DULCOLAX) 10 MG suppository Place 1 suppository (10 mg total) rectally daily as needed for moderate constipation. 12 suppository 0   carbidopa -levodopa  (SINEMET  CR) 50-200 MG tablet Take 1 tablet by mouth 6 (six) times daily. (4-5 AM, 8 AM, 11 AM, 2 PM, 5 PM, 8 PM). 180 tablet 5   cyanocobalamin  1000 MCG tablet Take 1 tablet (1,000 mcg total) by mouth daily. 30 tablet 0   feeding supplement (ENSURE PLUS HIGH PROTEIN) LIQD Take 237 mLs by mouth 2 (two) times daily between meals.     HYDROcodone -acetaminophen  (NORCO/VICODIN) 5-325 MG tablet Take 1 tablet by mouth every 8 (eight) hours as needed for severe pain (pain score 7-10) or moderate pain (pain score 4-6). 5 tablet 0   midodrine  (PROAMATINE ) 5 MG tablet Take 1 tablet (5 mg total) by mouth 2 (two) times daily with a meal.     Multiple Vitamin (MULTIVITAMIN WITH MINERALS) TABS tablet Take 1 tablet by mouth daily. 30 tablet 0   pantoprazole  (PROTONIX ) 40 MG tablet Take 1 tablet (40 mg total) by mouth 2 (two) times daily. 60 tablet 0   polyethylene glycol (MIRALAX  / GLYCOLAX ) 17 g packet Take 17 g by mouth daily. 14 each 0   pyridOXINE  (B-6) 50 MG tablet Take 1 tablet (50 mg total) by mouth daily.     thiamine  (VITAMIN B-1) 100 MG tablet Take 1 tablet (100 mg total) by mouth daily.     No current facility-administered medications for this visit.    Allergies as of 09/23/2024 - Review Complete 07/16/2024  Allergen Reaction Noted   Flomax  [tamsulosin  hcl] Anxiety and Other (  See Comments) 03/16/2024    Family History  Problem Relation Age of Onset   Cancer Father     Social History   Socioeconomic History   Marital status: Divorced    Spouse name: Not on file   Number of children: Not on file   Years of education: 16   Highest education level: Bachelor's degree (e.g., BA, AB, BS)  Occupational History   Occupation: Unemployed    Comment: Retail - [employer]  Tobacco Use   Smoking status: Former    Types: Cigars    Quit date: 2000     Years since quitting: 26.0   Smokeless tobacco: Never   Tobacco comments:    Quit 2000  Vaping Use   Vaping status: Never Used  Substance and Sexual Activity   Alcohol use: Not Currently   Drug use: Never   Sexual activity: Not Currently    Partners: Female    Birth control/protection: None  Other Topics Concern   Not on file  Social History Narrative   ** Merged History Encounter **       Right handed   Social Drivers of Health   Tobacco Use: Medium Risk (07/16/2024)   Patient History    Smoking Tobacco Use: Former    Smokeless Tobacco Use: Never    Passive Exposure: Not on Actuary Strain: Not on file  Food Insecurity: No Food Insecurity (07/11/2024)   Epic    Worried About Programme Researcher, Broadcasting/film/video in the Last Year: Never true    Ran Out of Food in the Last Year: Never true  Transportation Needs: No Transportation Needs (07/11/2024)   Epic    Lack of Transportation (Medical): No    Lack of Transportation (Non-Medical): No  Physical Activity: Not on file  Stress: Not on file  Social Connections: Not on file  Intimate Partner Violence: Not At Risk (07/11/2024)   Epic    Fear of Current or Ex-Partner: No    Emotionally Abused: No    Physically Abused: No    Sexually Abused: No  Depression (PHQ2-9): Low Risk (12/10/2023)   Depression (PHQ2-9)    PHQ-2 Score: 0  Alcohol Screen: Not on file  Housing: Low Risk (07/11/2024)   Epic    Unable to Pay for Housing in the Last Year: No    Number of Times Moved in the Last Year: 0    Homeless in the Last Year: No  Utilities: Not At Risk (07/11/2024)   Epic    Threatened with loss of utilities: No  Health Literacy: Not on file    Review of Systems:    Constitutional: No weight loss, fever or chills Cardiovascular: No chest pain  Respiratory: No SOB  Gastrointestinal: See HPI and otherwise negative   Physical Exam:  Vital signs: BP 134/80   Pulse 84   Ht 6' 2 (1.88 m)   Wt 224 lb (101.6 kg)   BMI  28.76 kg/m    Constitutional:   Pleasant Caucasian male appears to be in NAD, Well developed, Well nourished, alert and cooperative Respiratory: Respirations even and unlabored. Lungs clear to auscultation bilaterally.   No wheezes, crackles, or rhonchi.  Cardiovascular: Normal S1, S2. No MRG. Regular rate and rhythm. No peripheral edema, cyanosis or pallor.  Gastrointestinal:  Soft, nondistended, nontender. No rebound or guarding. Normal bowel sounds. No appreciable masses or hepatomegaly. Rectal:  Not performed.  Msk:  Symmetrical without gross deformities. Without edema, no deformity or joint abnormality. +in  wheelchair but can ambulate short distances Neurologic:  Alert and  oriented x4;  grossly normal neurologically. +parkinsons Skin:   Dry and intact without significant lesions or rashes. Psychiatric: Oriented to person, place and time. Demonstrates good judgement and reason without abnormal affect or behaviors.  MOST RECENT: CBC    Component Value Date/Time   WBC 8.3 07/16/2024 0748   RBC 3.15 (L) 07/16/2024 0748   HGB 8.1 (L) 07/16/2024 0748   HGB 11.8 (L) 12/10/2023 1222   HCT 27.0 (L) 07/16/2024 0748   HCT 36.4 (L) 12/10/2023 1222   PLT 396 07/16/2024 0748   PLT 303 12/10/2023 1222   MCV 85.7 07/16/2024 0748   MCV 85 12/10/2023 1222   MCH 25.7 (L) 07/16/2024 0748   MCHC 30.0 07/16/2024 0748   RDW 16.0 (H) 07/16/2024 0748   RDW 13.5 12/10/2023 1222   LYMPHSABS 1.6 07/16/2024 0748   LYMPHSABS 1.7 12/10/2023 1222   MONOABS 0.5 07/16/2024 0748   EOSABS 0.2 07/16/2024 0748   EOSABS 0.1 12/10/2023 1222   BASOSABS 0.1 07/16/2024 0748   BASOSABS 0.1 12/10/2023 1222    CMP     Component Value Date/Time   NA 139 07/16/2024 0748   NA 137 12/10/2023 1222   K 3.8 07/16/2024 0748   CL 107 07/16/2024 0748   CO2 22 07/16/2024 0748   GLUCOSE 98 07/16/2024 0748   BUN 13 07/16/2024 0748   BUN 20 12/10/2023 1222   CREATININE 1.13 07/16/2024 0748   CALCIUM  8.5 (L) 07/16/2024  0748   PROT 5.5 (L) 07/16/2024 0748   PROT 6.4 12/10/2023 1222   ALBUMIN 2.7 (L) 07/16/2024 0748   ALBUMIN 4.2 12/10/2023 1222   AST 13 (L) 07/16/2024 0748   ALT 6 07/16/2024 0748   ALKPHOS 69 07/16/2024 0748   BILITOT 0.5 07/16/2024 0748   BILITOT 0.7 12/10/2023 1222   GFRNONAA >60 07/16/2024 0748   GFRAA 88 03/13/2020 1531   Assessment & Plan Esophagitis with esophageal stricture and dysphagia He has had progressive dysphagia to solids over the past 2 weeks, possible secondary to esophageal stricture from healing severe esophagitis. He maintains oral intake with caution, without evidence of acute gastrointestinal bleeding, and is not receiving anticoagulation. - Advised to chew food thoroughly, avoid problematic foods such as meats, breads, and rice, and increase water intake with meals to reduce risk of food impaction. - Discussed risk of food impaction and need for emergent intervention if obstruction occurs. - Continued pantoprazole  40 mg twice daily. - Ordered repeat upper endoscopy to assess for resolution of esophagitis and evaluate for esophageal stricture or other etiologies of dysphagia.  To schedule [facility]. Legrand in the [facility].  Did provide the patient a detailed list of risk for the procedure and he agrees to proceed.  Will also let our anesthesiology team review chart to make sure this is not better done at the hospital.  Patient has Parkinson's but is able to ambulate short distances.  History of SBO: During recent hospitalization in October, no further complaints or problems with this  History of PE 02/2024: Initially on Eliquis , but had 2 significant bleeds, so this was discontinued after recent hospitalization  Parkinson's disease: Ambulates in a wheelchair, but can walk short distances  IDA: recent upper GI bleed, labs have not been rechecked since October, will recheck today, added a CBC, CMP and iron studies with ferritin  Patient to follow in clinic per recommendations  after time of EGD with Dr. Legrand.   John Failing, PA-C  [facility] 09/22/2024, 2:16 PM  Cc: [name], MD  "

## 2024-09-23 ENCOUNTER — Other Ambulatory Visit (INDEPENDENT_AMBULATORY_CARE_PROVIDER_SITE_OTHER)

## 2024-09-23 ENCOUNTER — Ambulatory Visit (INDEPENDENT_AMBULATORY_CARE_PROVIDER_SITE_OTHER): Admitting: Physician Assistant

## 2024-09-23 ENCOUNTER — Ambulatory Visit: Payer: Self-pay | Admitting: Physician Assistant

## 2024-09-23 ENCOUNTER — Encounter: Payer: Self-pay | Admitting: Physician Assistant

## 2024-09-23 VITALS — BP 134/80 | HR 84 | Ht 74.0 in | Wt 224.0 lb

## 2024-09-23 DIAGNOSIS — D509 Iron deficiency anemia, unspecified: Secondary | ICD-10-CM

## 2024-09-23 DIAGNOSIS — K219 Gastro-esophageal reflux disease without esophagitis: Secondary | ICD-10-CM

## 2024-09-23 DIAGNOSIS — R1310 Dysphagia, unspecified: Secondary | ICD-10-CM

## 2024-09-23 DIAGNOSIS — K222 Esophageal obstruction: Secondary | ICD-10-CM | POA: Diagnosis not present

## 2024-09-23 DIAGNOSIS — K209 Esophagitis, unspecified without bleeding: Secondary | ICD-10-CM

## 2024-09-23 LAB — CBC WITH DIFFERENTIAL/PLATELET
Basophils Absolute: 0 K/uL (ref 0.0–0.1)
Basophils Relative: 1.1 % (ref 0.0–3.0)
Eosinophils Absolute: 0.1 K/uL (ref 0.0–0.7)
Eosinophils Relative: 2.1 % (ref 0.0–5.0)
HCT: 32.4 % — ABNORMAL LOW (ref 39.0–52.0)
Hemoglobin: 10.3 g/dL — ABNORMAL LOW (ref 13.0–17.0)
Lymphocytes Relative: 31.5 % (ref 12.0–46.0)
Lymphs Abs: 1.3 K/uL (ref 0.7–4.0)
MCHC: 31.9 g/dL (ref 30.0–36.0)
MCV: 81.7 fl (ref 78.0–100.0)
Monocytes Absolute: 0.3 K/uL (ref 0.1–1.0)
Monocytes Relative: 7.1 % (ref 3.0–12.0)
Neutro Abs: 2.4 K/uL (ref 1.4–7.7)
Neutrophils Relative %: 58.2 % (ref 43.0–77.0)
Platelets: 213 K/uL (ref 150.0–400.0)
RBC: 3.97 Mil/uL — ABNORMAL LOW (ref 4.22–5.81)
RDW: 17 % — ABNORMAL HIGH (ref 11.5–15.5)
WBC: 4.2 K/uL (ref 4.0–10.5)

## 2024-09-23 LAB — COMPREHENSIVE METABOLIC PANEL WITH GFR
ALT: 3 U/L (ref 3–53)
AST: 12 U/L (ref 5–37)
Albumin: 4.1 g/dL (ref 3.5–5.2)
Alkaline Phosphatase: 81 U/L (ref 39–117)
BUN: 15 mg/dL (ref 6–23)
CO2: 29 meq/L (ref 19–32)
Calcium: 9.3 mg/dL (ref 8.4–10.5)
Chloride: 107 meq/L (ref 96–112)
Creatinine, Ser: 1.02 mg/dL (ref 0.40–1.50)
GFR: 77.87 mL/min
Glucose, Bld: 96 mg/dL (ref 70–99)
Potassium: 4.2 meq/L (ref 3.5–5.1)
Sodium: 141 meq/L (ref 135–145)
Total Bilirubin: 0.6 mg/dL (ref 0.2–1.2)
Total Protein: 6.2 g/dL (ref 6.0–8.3)

## 2024-09-23 LAB — IBC + FERRITIN
Ferritin: 19.3 ng/mL — ABNORMAL LOW (ref 22.0–322.0)
Iron: 36 ug/dL — ABNORMAL LOW (ref 42–165)
Saturation Ratios: 11.6 % — ABNORMAL LOW (ref 20.0–50.0)
TIBC: 309.4 ug/dL (ref 250.0–450.0)
Transferrin: 221 mg/dL (ref 212.0–360.0)

## 2024-09-23 MED ORDER — PANTOPRAZOLE SODIUM 40 MG PO TBEC: 40.0000 mg | DELAYED_RELEASE_TABLET | Freq: Two times a day (BID) | ORAL | 2 refills | Status: AC

## 2024-09-23 NOTE — Patient Instructions (Addendum)
 Your provider has requested that you go to the basement level for lab work before leaving today. Press B on the elevator. The lab is located at the first door on the left as you exit the elevator.    You have been scheduled for an endoscopy. Please follow written instructions given to you at your visit today.  If you use inhalers (even only as needed), please bring them with you on the day of your procedure.  If you take any of the following medications, they will need to be adjusted prior to your procedure:   DO NOT TAKE 7 DAYS PRIOR TO TEST- Trulicity (dulaglutide) Ozempic, Wegovy (semaglutide) Mounjaro, Zepbound (tirzepatide) Bydureon Bcise (exanatide extended release)  DO NOT TAKE 1 DAY PRIOR TO YOUR TEST Rybelsus (semaglutide) Adlyxin (lixisenatide) Victoza (liraglutide) Byetta (exanatide) ___________________________________________________________________________   Continue Pantoprazole  twice daily.   Due to recent changes in healthcare laws, you may see the results of your imaging and laboratory studies on MyChart before your provider has had a chance to review them.  We understand that in some cases there may be results that are confusing or concerning to you. Not all laboratory results come back in the same time frame and the provider may be waiting for multiple results in order to interpret others.  Please give us  48 hours in order for your provider to thoroughly review all the results before contacting the office for clarification of your results.    _______________________________________________________  If your blood pressure at your visit was 140/90 or greater, please contact your primary care physician to follow up on this.  _______________________________________________________  If you are age 54 or older, your body mass index should be between 23-30. Your Body mass index is 28.76 kg/m. If this is out of the aforementioned range listed, please consider follow  up with your Primary Care Provider.  If you are age 82 or younger, your body mass index should be between 19-25. Your Body mass index is 28.76 kg/m. If this is out of the aformentioned range listed, please consider follow up with your Primary Care Provider.   ________________________________________________________  The [facility] providers would like to encourage you to use MYCHART to communicate with providers for non-urgent requests or questions.  Due to long hold times on the telephone, sending your provider a message by [facility] may be a faster and more efficient way to get a response.  Please allow 48 business hours for a response.  Please remember that this is for non-urgent requests.  _______________________________________________________  [facility] is using a team-based approach to care.  Your team is made up of your doctor and two to three APPS. Our APPS (Nurse Practitioners and Physician Assistants) work with your physician to ensure care continuity for you. They are fully qualified to address your health concerns and develop a treatment plan. They communicate directly with your gastroenterologist to care for you. Seeing the Advanced Practice Practitioners on your physician's team can help you by facilitating care more promptly, often allowing for earlier appointments, access to diagnostic testing, procedures, and other specialty referrals.   Thank you for choosing me and [facility].  [name] Failing, PA-C

## 2024-09-24 ENCOUNTER — Telehealth: Payer: Self-pay

## 2024-09-24 ENCOUNTER — Other Ambulatory Visit: Payer: Self-pay

## 2024-09-24 DIAGNOSIS — K219 Gastro-esophageal reflux disease without esophagitis: Secondary | ICD-10-CM

## 2024-09-24 DIAGNOSIS — R1310 Dysphagia, unspecified: Secondary | ICD-10-CM

## 2024-09-24 NOTE — Telephone Encounter (Signed)
-----   Message from [name] Failing, [place] sent at 09/24/2024  8:23 AM EST ----- Regarding: move procedure to jospital See Dr. Clayburn message. Please move procedure to the hospital.  Thanks-JLL ----- Message ----- From: Legrand Victory LITTIE DOUGLAS, MD Sent: 09/24/2024   4:56 AM EST To: [name] Hendricks Failing, PA

## 2024-09-24 NOTE — Telephone Encounter (Signed)
 [facility] case cancelled

## 2024-09-24 NOTE — Telephone Encounter (Signed)
 Legrand Victory LITTIE DOUGLAS, MD at 09/23/2024 10:20 AM  Status: Signed  ____________________________________________________________   Attending physician addendum:   Thank you for sending this case to me. I have reviewed the entire note and agree with the plan.   However, please move this patient's procedure to the hospital outpatient setting. My next open date is Feb 9th.    Beather [name] Gibson, [place]  Danis, Victory LITTIE DOUGLAS, MD; P [facility] Triage He seemed very with it today.  I think he can provide own consent, but can have nursing staff check on this.  Nurse: When we call to reschedule patient, can we confirm with nursing facility staff that patient can provide his own consent.  Otherwise this needs to be gotten through sister prior to time procedure.  Thanks-JLL       Previous Messages    ----- Message ----- From: Legrand Victory LITTIE DOUGLAS, MD Sent: 09/24/2024   5:05 AM EST To: [name] Gibson Beather, PA Subject: one more item                                  [name],  1 more thing-this man's sister had to give consent for his EGD during the hospitalization. Is he able to give his own consent for the procedure?  If not, his family needs to be brought into the planning,, especially since we are planning to do it at the hospital and I do not want to waste a slot there with concerns of consent on that day.  HD

## 2024-09-24 NOTE — Telephone Encounter (Signed)
 EGD set up for 11/11/24 at [facility] with Dr Legrand at 9 am  Left message on machine to call back on pt machine and sister [name] machine

## 2024-09-24 NOTE — Progress Notes (Signed)
 ____________________________________________________________  Attending physician addendum:  Thank you for sending this case to me. I have reviewed the entire note and agree with the plan.  However, please move this patient's procedure to the hospital outpatient setting. My next open date is Feb 9th.  Victory Brand, MD  ____________________________________________________________

## 2024-09-27 ENCOUNTER — Other Ambulatory Visit: Payer: Self-pay

## 2024-09-27 DIAGNOSIS — D509 Iron deficiency anemia, unspecified: Secondary | ICD-10-CM

## 2024-09-27 NOTE — Telephone Encounter (Signed)
 Left message on machine to call back on Sister [name] machine

## 2024-09-28 NOTE — Telephone Encounter (Signed)
 I was able to speak with the pt sister [name] and make her aware of the appt for EGD with Dr Legrand.  I have also called [employer] and spoke with Adria and was asked to fax the information to [phone] attention [place].  All information faxed

## 2024-10-04 ENCOUNTER — Telehealth: Payer: Self-pay | Admitting: Neurology

## 2024-10-04 DIAGNOSIS — G20A2 Parkinson's disease without dyskinesia, with fluctuations: Secondary | ICD-10-CM

## 2024-10-04 NOTE — Telephone Encounter (Signed)
 Pt is requesting a [employer] Ref for [employer] testing

## 2024-10-04 NOTE — Telephone Encounter (Signed)
 Per Dr.Jaffe, He already had neuropsych testing with us  in October.   Tried calling patient no answer.

## 2024-10-04 NOTE — Telephone Encounter (Signed)
 Continued...[employer] [phone]

## 2024-10-04 NOTE — Telephone Encounter (Signed)
 Per patient he was requesting for a driver evaluation referral.

## 2024-10-05 ENCOUNTER — Telehealth: Payer: Self-pay | Admitting: Neurology

## 2024-10-05 NOTE — Telephone Encounter (Signed)
 Per Dr.Jaffe, We can provide them with those contacts.   Referral sent.

## 2024-10-05 NOTE — Telephone Encounter (Signed)
 Pt is returning a call. Pt stated that who ever called him did not say what they needed. Thanks

## 2024-10-07 NOTE — Telephone Encounter (Signed)
 See encounter 10/05/24.

## 2024-10-13 ENCOUNTER — Encounter: Admitting: Gastroenterology

## 2024-10-21 ENCOUNTER — Ambulatory Visit: Admitting: Family Medicine

## 2024-10-27 ENCOUNTER — Ambulatory Visit: Admitting: Neurology

## 2024-10-27 ENCOUNTER — Ambulatory Visit

## 2024-11-11 ENCOUNTER — Ambulatory Visit (HOSPITAL_COMMUNITY): Admit: 2024-11-11 | Admitting: Gastroenterology

## 2024-11-11 ENCOUNTER — Encounter (HOSPITAL_COMMUNITY): Payer: Self-pay

## 2024-11-11 SURGERY — EGD (ESOPHAGOGASTRODUODENOSCOPY)
Anesthesia: Monitor Anesthesia Care

## 2024-12-13 ENCOUNTER — Encounter: Payer: Self-pay | Admitting: Family Medicine
